# Patient Record
Sex: Female | Born: 1990 | Race: White | Hispanic: No | Smoking: Current every day smoker
Health system: Southern US, Community
[De-identification: ages and names within clinical notes are randomized; demographics above are authoritative.]

## PROBLEM LIST (undated history)

## (undated) ENCOUNTER — Inpatient Hospital Stay (HOSPITAL_COMMUNITY): Payer: Self-pay

## (undated) DIAGNOSIS — N200 Calculus of kidney: Secondary | ICD-10-CM

## (undated) DIAGNOSIS — B1920 Unspecified viral hepatitis C without hepatic coma: Secondary | ICD-10-CM

## (undated) DIAGNOSIS — F1110 Opioid abuse, uncomplicated: Secondary | ICD-10-CM

## (undated) HISTORY — DX: Unspecified viral hepatitis C without hepatic coma: B19.20

---
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
## 2012-07-23 ENCOUNTER — Emergency Department (HOSPITAL_BASED_OUTPATIENT_CLINIC_OR_DEPARTMENT_OTHER)
Admission: EM | Admit: 2012-07-23 | Discharge: 2012-07-23 | Disposition: A | Discharge status: Home / Self Care | Payer: Self-pay | Attending: Emergency Medicine | Admitting: Emergency Medicine

## 2012-07-23 ENCOUNTER — Encounter (HOSPITAL_BASED_OUTPATIENT_CLINIC_OR_DEPARTMENT_OTHER): Payer: Self-pay

## 2012-07-23 DIAGNOSIS — N938 Other specified abnormal uterine and vaginal bleeding: Secondary | ICD-10-CM | POA: Insufficient documentation

## 2012-07-23 DIAGNOSIS — R300 Dysuria: Secondary | ICD-10-CM | POA: Insufficient documentation

## 2012-07-23 DIAGNOSIS — N390 Urinary tract infection, site not specified: Secondary | ICD-10-CM | POA: Insufficient documentation

## 2012-07-23 DIAGNOSIS — F17200 Nicotine dependence, unspecified, uncomplicated: Secondary | ICD-10-CM | POA: Insufficient documentation

## 2012-07-23 DIAGNOSIS — R109 Unspecified abdominal pain: Secondary | ICD-10-CM | POA: Insufficient documentation

## 2012-07-23 DIAGNOSIS — Z3202 Encounter for pregnancy test, result negative: Secondary | ICD-10-CM | POA: Insufficient documentation

## 2012-07-23 DIAGNOSIS — N949 Unspecified condition associated with female genital organs and menstrual cycle: Secondary | ICD-10-CM | POA: Insufficient documentation

## 2012-07-23 LAB — URINALYSIS, ROUTINE W REFLEX MICROSCOPIC
Glucose, UA: NEGATIVE mg/dL
Ketones, ur: NEGATIVE mg/dL
Protein, ur: NEGATIVE mg/dL
pH: 6.5 (ref 5.0–8.0)

## 2012-07-23 LAB — URINE MICROSCOPIC-ADD ON

## 2012-07-23 MED ORDER — SULFAMETHOXAZOLE-TRIMETHOPRIM 800-160 MG PO TABS: 1.0000 | ORAL_TABLET | Freq: Two times a day (BID) | ORAL | Status: DC

## 2012-07-23 NOTE — ED Notes (Signed)
Abd soft. Tender over bladder and LLQ. BS X 4

## 2012-07-23 NOTE — ED Provider Notes (Signed)
History     CSN: [id number]  Arrival date & time 07/23/12  1256   First MD Initiated Contact with Patient 07/23/12 1316      Chief Complaint  Patient presents with  . Vaginal Bleeding  . Abdominal Pain    (Consider location/radiation/quality/duration/timing/severity/associated sxs/prior treatment) HPI Comments: Patient presents with lower abdominal discomfort and vaginal bleeding that started yesterday. She states she had her normal period about a week and half ago and then yesterday started having some vaginal bleeding again. She states she's passing clots at times. She has a crampy discomfort to her suprapubic and lower abdomen. It's otherwise nonradiating. She denies any nausea or vomiting. She denies he fevers or chills. She denies any other vaginal discharge. She does have a little bit of burning on urination has had a history of UTIs in the past.   History reviewed. No pertinent past medical history.  Past Surgical History  Procedure Laterality Date  . Cesarean section  02/02/2012    History reviewed. No pertinent family history.  History  Substance Use Topics  . Smoking status: Current Every Day Smoker -- 0.50 packs/day for 5 years    Types: Cigarettes  . Smokeless tobacco: Never Used  . Alcohol Use: No    OB History   Grav Para Term Preterm Abortions TAB SAB Ect Mult Living                  Review of Systems  Constitutional: Negative for fever, chills, diaphoresis and fatigue.  HENT: Negative for congestion, rhinorrhea and sneezing.   Eyes: Negative.   Respiratory: Negative for cough, chest tightness and shortness of breath.   Cardiovascular: Negative for chest pain and leg swelling.  Gastrointestinal: Negative for nausea, vomiting, abdominal pain, diarrhea and blood in stool.  Genitourinary: Positive for dysuria, vaginal bleeding and pelvic pain. Negative for frequency, hematuria, flank pain and difficulty urinating.  Musculoskeletal: Negative for back pain  and arthralgias.  Skin: Negative for rash.  Neurological: Negative for dizziness, speech difficulty, weakness, numbness and headaches.    Allergies  Review of patient's allergies indicates no known allergies.  Home Medications   Current Outpatient Rx  Name  Route  Sig  Dispense  Refill  . sulfamethoxazole-trimethoprim (SEPTRA DS) 800-160 MG per tablet   Oral   Take 1 tablet by mouth every 12 (twelve) hours.   14 tablet   0     BP 117/75  Pulse 94  Temp(Src) 98.1 F (36.7 C) (Oral)  Resp 16  Ht 5\' 5"  (1.651 m)  Wt 138 lb (62.596 kg)  BMI 22.96 kg/m2  SpO2 99%  LMP 07/04/2012  Physical Exam  Constitutional: She is oriented to person, place, and time. She appears well-developed and well-nourished.  HENT:  Head: Normocephalic and atraumatic.  Eyes: Pupils are equal, round, and reactive to light.  Neck: Normal range of motion. Neck supple.  Cardiovascular: Normal rate, regular rhythm and normal heart sounds.   Pulmonary/Chest: Effort normal and breath sounds normal. No respiratory distress. She has no wheezes. She has no rales. She exhibits no tenderness.  Abdominal: Soft. Bowel sounds are normal. There is tenderness (mild tenderness in the suprapubic area). There is no rebound and no guarding.  Genitourinary: Uterus normal. Cervix exhibits no motion tenderness and no discharge. Right adnexum displays no tenderness. Left adnexum displays no tenderness. There is bleeding (small amount of dark bleeding) around the vagina. No vaginal discharge found.  Musculoskeletal: Normal range of motion. She exhibits no edema.  Lymphadenopathy:    She has no cervical adenopathy.  Neurological: She is alert and oriented to person, place, and time.  Skin: Skin is warm and dry. No rash noted.  Psychiatric: She has a normal mood and affect.    ED Course  Procedures (including critical care time)  Results for orders placed during the hospital encounter of 07/23/12  PREGNANCY, URINE       Result Value Range   Preg Test, Ur NEGATIVE  NEGATIVE   No results found.    1. UTI (lower urinary tract infection)   2. Dysfunctional uterine bleeding       MDM  Pt started on abx, no current heavy bleeding.  Will refer to outpt clinic.        Rolan Bucco, MD 07/23/12 [phone]

## 2012-07-23 NOTE — ED Notes (Signed)
Pt states that she is experiencing some irregular vaginal bleeding associated with bilateral lower abdominal pain which she states is over her ovaries.

## 2012-07-23 NOTE — ED Notes (Signed)
Pelvic cart is at the bedside set up and ready for the doctor to use. 

## 2012-07-26 LAB — URINE CULTURE

## 2012-07-27 ENCOUNTER — Telehealth (HOSPITAL_COMMUNITY): Payer: Self-pay | Admitting: Emergency Medicine

## 2012-07-27 NOTE — ED Notes (Signed)
+  Urine. Patient treated with Septra. Sensitive to same. Per protocol MD. °

## 2012-07-27 NOTE — ED Notes (Signed)
Patient has +Urine culture. °

## 2013-02-03 ENCOUNTER — Emergency Department (HOSPITAL_BASED_OUTPATIENT_CLINIC_OR_DEPARTMENT_OTHER)
Admission: EM | Admit: 2013-02-03 | Discharge: 2013-02-03 | Disposition: A | Discharge status: Home / Self Care | Payer: Self-pay | Attending: Emergency Medicine | Admitting: Emergency Medicine

## 2013-02-03 ENCOUNTER — Encounter (HOSPITAL_BASED_OUTPATIENT_CLINIC_OR_DEPARTMENT_OTHER): Payer: Self-pay | Admitting: Emergency Medicine

## 2013-02-03 DIAGNOSIS — F1910 Other psychoactive substance abuse, uncomplicated: Secondary | ICD-10-CM | POA: Insufficient documentation

## 2013-02-03 DIAGNOSIS — F17200 Nicotine dependence, unspecified, uncomplicated: Secondary | ICD-10-CM | POA: Insufficient documentation

## 2013-02-03 HISTORY — DX: Opioid abuse, uncomplicated: F11.10

## 2013-02-03 MED ORDER — CLONIDINE HCL 0.2 MG PO TABS: 0.2000 mg | ORAL_TABLET | Freq: Two times a day (BID) | ORAL | Status: DC

## 2013-02-03 NOTE — ED Notes (Signed)
Patient asks to use the restroom. She was prompted to get a urine sample. States she is going to the bathroom but she cannot provide urine at this time.

## 2013-02-03 NOTE — ED Provider Notes (Signed)
CSN: [id number]     Arrival date & time 02/03/13  1425 History   First MD Initiated Contact with Patient 02/03/13 1457     Chief Complaint  Patient presents with  . Medical Clearance   (Consider location/radiation/quality/duration/timing/severity/associated sxs/prior Treatment) Patient is a 22 y.o. female presenting with drug/alcohol assessment. The history is provided by the patient. No language interpreter was used.  Drug / Alcohol Assessment Similar prior episodes: yes   Severity:  Moderate Timing:  Constant Progression:  Worsening Pt was just discharged from [place].   She plans to go into [facility].   Pt reports she is here for medication to help with withdrawal.  (Pt request suboxone) Pt is planning to go back to [facility] but has decided she wants to go home today  Past Medical History  Diagnosis Date  . Heroin abuse    Past Surgical History  Procedure Laterality Date  . Cesarean section  02/02/2012   No family history on file. History  Substance Use Topics  . Smoking status: Current Every Day Smoker -- 0.50 packs/day for 5 years    Types: Cigarettes  . Smokeless tobacco: Never Used  . Alcohol Use: No   OB History   Grav Para Term Preterm Abortions TAB SAB Ect Mult Living                 Review of Systems  All other systems reviewed and are negative.    Allergies  Review of patient's allergies indicates no known allergies.  Home Medications   Current Outpatient Rx  Name  Route  Sig  Dispense  Refill  . sulfamethoxazole-trimethoprim (SEPTRA DS) 800-160 MG per tablet   Oral   Take 1 tablet by mouth every 12 (twelve) hours.   14 tablet   0    BP 113/86  Pulse 115  Temp(Src) 98 F (36.7 C) (Oral)  Resp 18  Ht 5\' 6"  (1.676 m)  Wt 127 lb (57.607 kg)  BMI 20.51 kg/m2  SpO2 100% Physical Exam  Nursing note and vitals reviewed. Constitutional: She appears well-developed and well-nourished.  HENT:  Head: Normocephalic and atraumatic.  Eyes: Pupils are  equal, round, and reactive to light.  Neck: Normal range of motion. Neck supple.  Cardiovascular: Normal rate and regular rhythm.   Pulmonary/Chest: Effort normal.  Abdominal: Soft. Bowel sounds are normal.  Musculoskeletal: Normal range of motion.  Neurological: She is alert.  Skin: Skin is warm.  Psychiatric: She has a normal mood and affect.    ED Course  Procedures (including critical care time) Labs Review Labs Reviewed - No data to display Imaging Review No results found.  EKG Interpretation   None       MDM   1. Substance abuse    Clonidine,   Return if any problems.    Elson Areas, PA-C 02/03/13 1620

## 2013-02-03 NOTE — ED Provider Notes (Signed)
Medical screening examination/treatment/procedure(s) were performed by non-physician practitioner and as supervising physician I was immediately available for consultation/collaboration.   Rolan Bucco, MD 02/03/13 1750

## 2013-02-03 NOTE — ED Notes (Signed)
Patient states she wants to leave and go somewhere else for detox.  She states she was brought here today by [facility] for detox from heroin.  States she wants to call her mother, but is not getting any answer.

## 2013-02-03 NOTE — ED Notes (Signed)
Patient states she wants detox from Heroin.  States she has been using for the last seven months and that her last dose was two days ago.  States she has been injecting up to two to three grams of heroin a day.

## 2014-06-14 ENCOUNTER — Emergency Department (HOSPITAL_COMMUNITY): Payer: Self-pay

## 2014-06-14 ENCOUNTER — Emergency Department (HOSPITAL_COMMUNITY)
Admission: EM | Admit: 2014-06-14 | Discharge: 2014-06-14 | Disposition: A | Discharge status: Home / Self Care | Payer: Self-pay | Attending: Emergency Medicine | Admitting: Emergency Medicine

## 2014-06-14 ENCOUNTER — Encounter (HOSPITAL_COMMUNITY): Payer: Self-pay | Admitting: *Deleted

## 2014-06-14 DIAGNOSIS — Y998 Other external cause status: Secondary | ICD-10-CM | POA: Insufficient documentation

## 2014-06-14 DIAGNOSIS — Y9389 Activity, other specified: Secondary | ICD-10-CM | POA: Insufficient documentation

## 2014-06-14 DIAGNOSIS — F1110 Opioid abuse, uncomplicated: Secondary | ICD-10-CM | POA: Insufficient documentation

## 2014-06-14 DIAGNOSIS — S20211A Contusion of right front wall of thorax, initial encounter: Secondary | ICD-10-CM | POA: Insufficient documentation

## 2014-06-14 DIAGNOSIS — Z9889 Other specified postprocedural states: Secondary | ICD-10-CM | POA: Insufficient documentation

## 2014-06-14 DIAGNOSIS — F419 Anxiety disorder, unspecified: Secondary | ICD-10-CM | POA: Insufficient documentation

## 2014-06-14 DIAGNOSIS — Z720 Tobacco use: Secondary | ICD-10-CM | POA: Insufficient documentation

## 2014-06-14 DIAGNOSIS — Y9289 Other specified places as the place of occurrence of the external cause: Secondary | ICD-10-CM | POA: Insufficient documentation

## 2014-06-14 IMAGING — CR DG RIBS W/ CHEST 3+V*R*
4 series · 4 of 4 positions shown · non-contrast
Comparison: None.

CLINICAL DATA: Tackling injury.  Complains of right rib cage pain.

EXAM:
RIGHT RIBS AND CHEST - 3+ VIEW

[w chest pa]
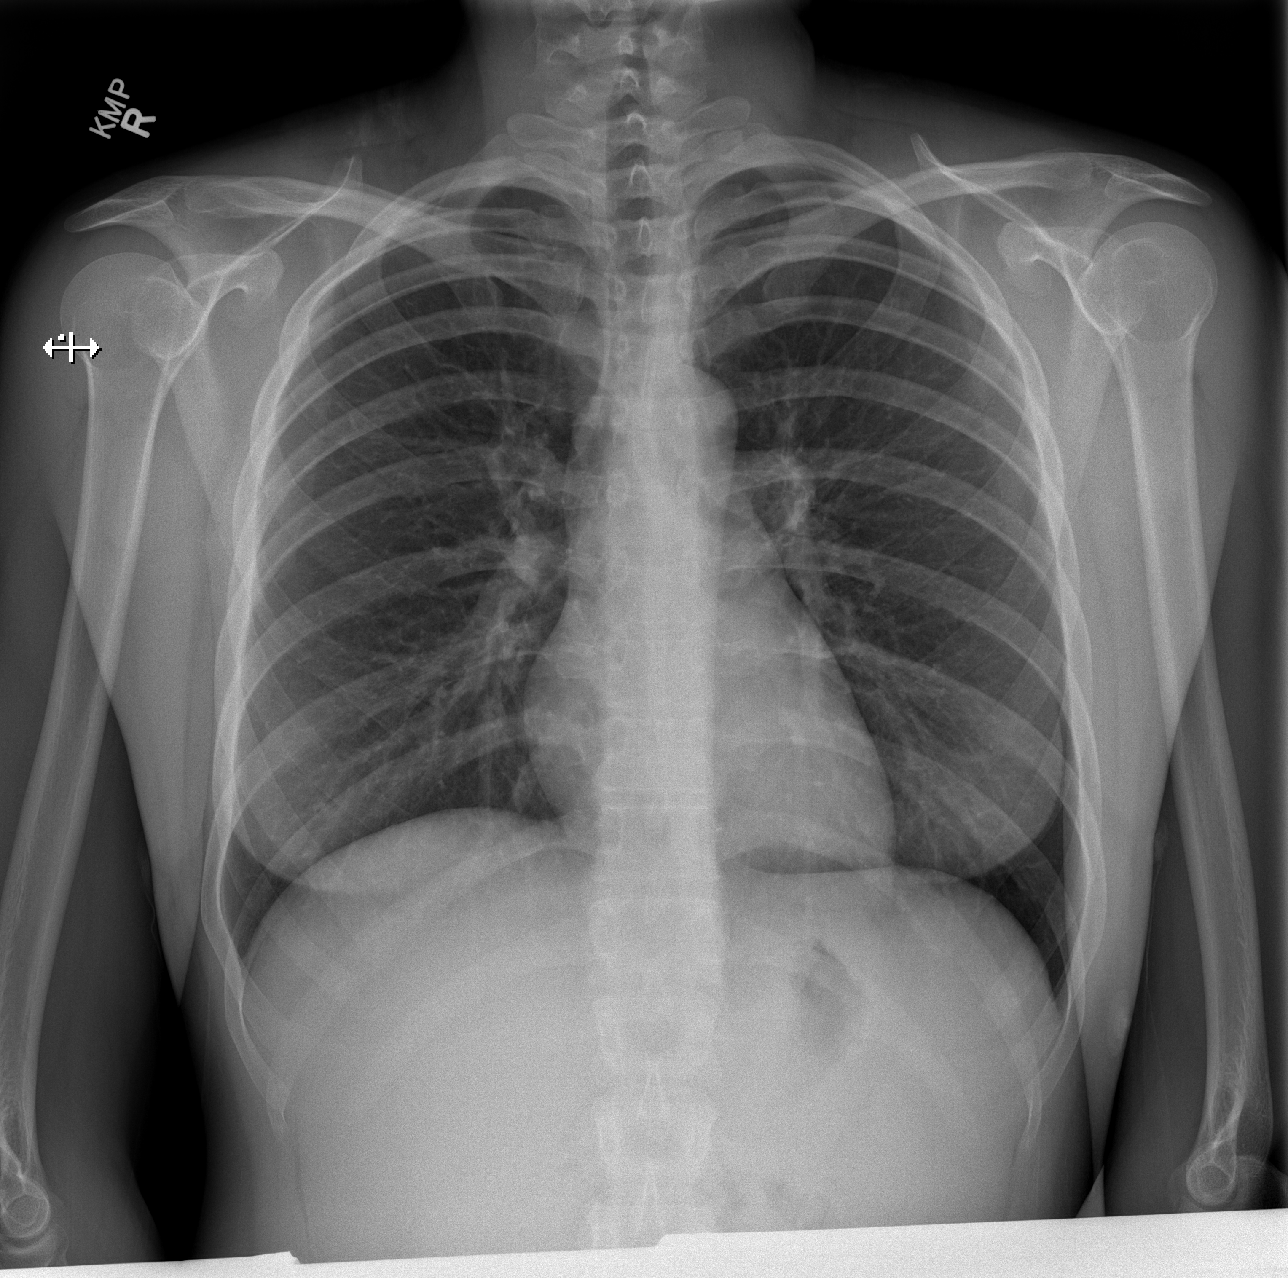

[w ribs ap upper right]
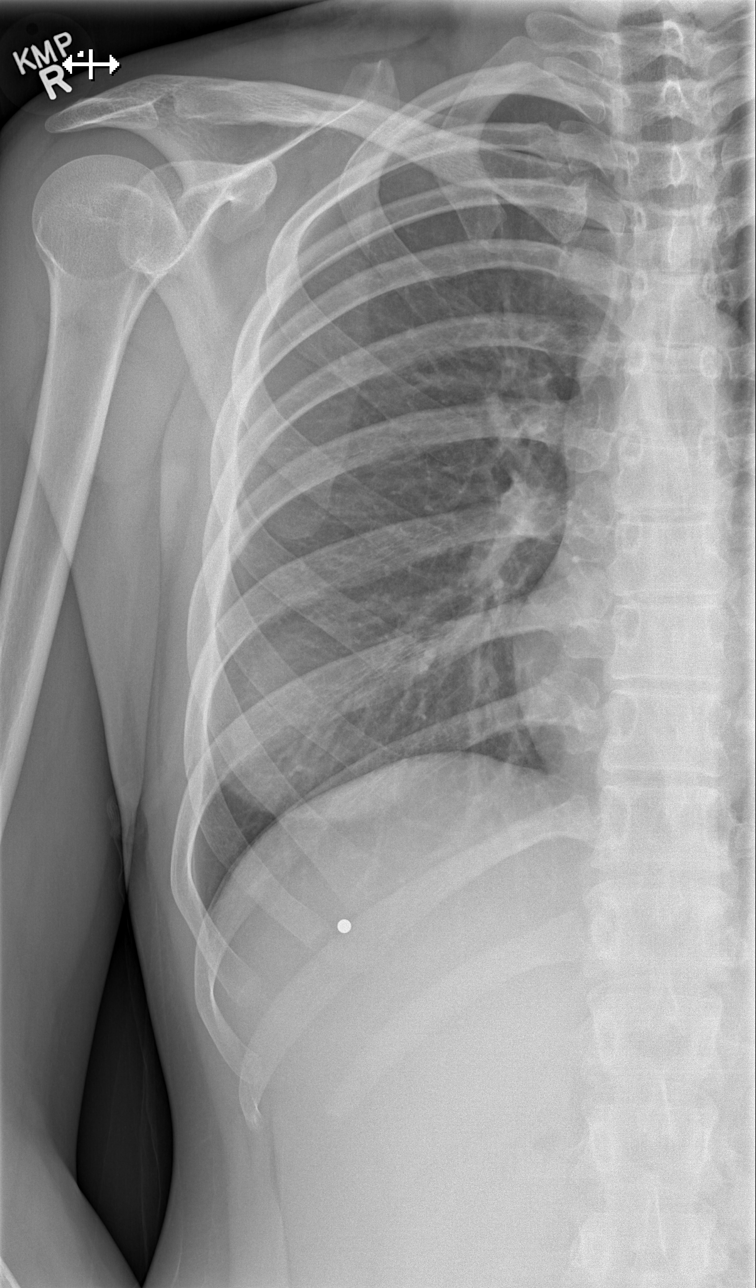

[w ribs ap lower right]
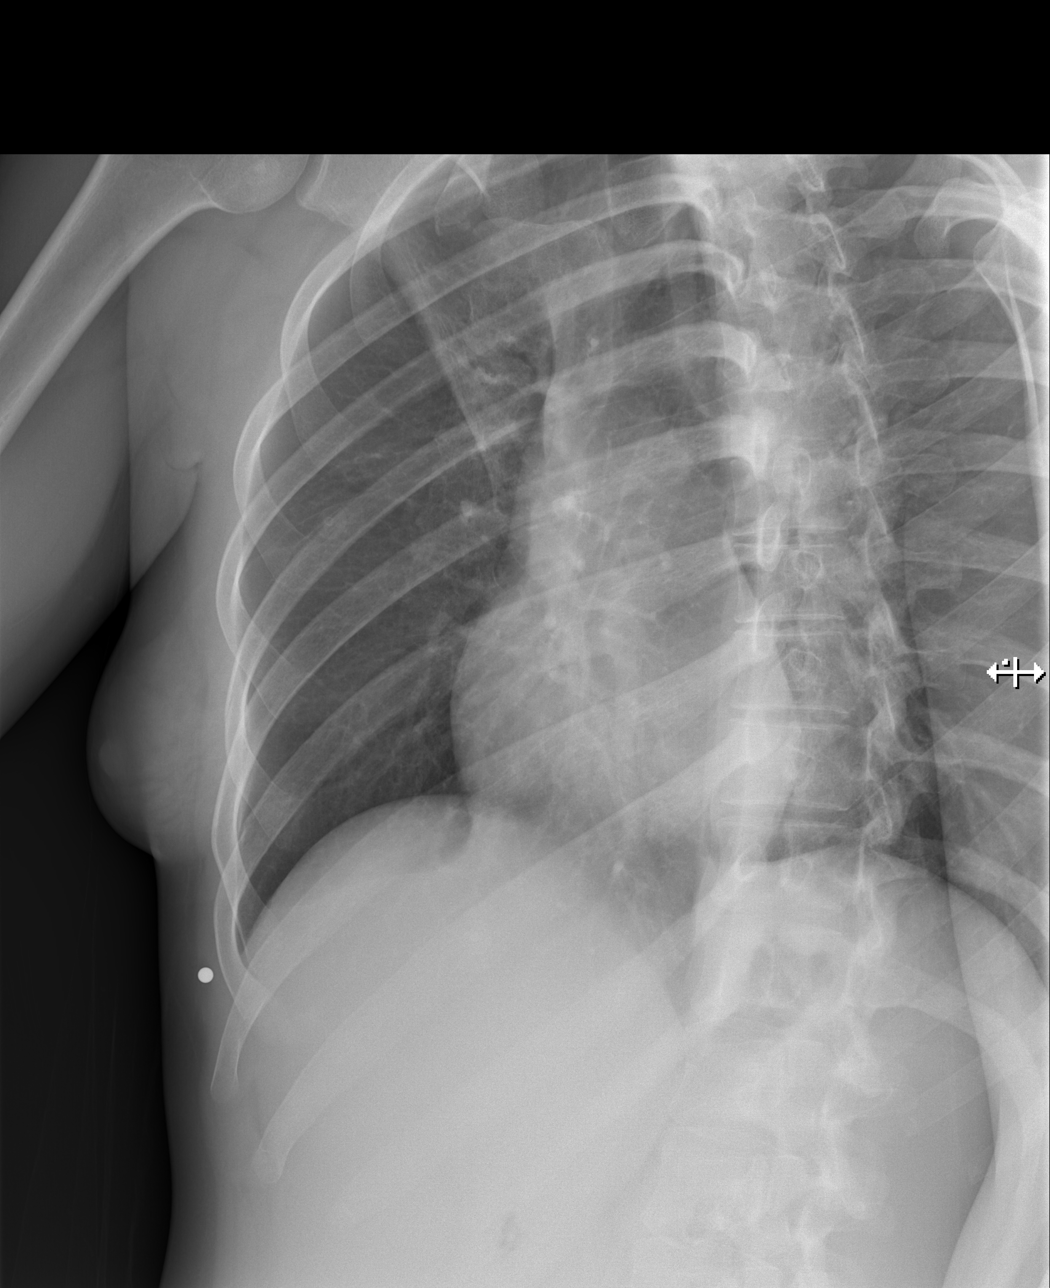

[w ribs obl right]
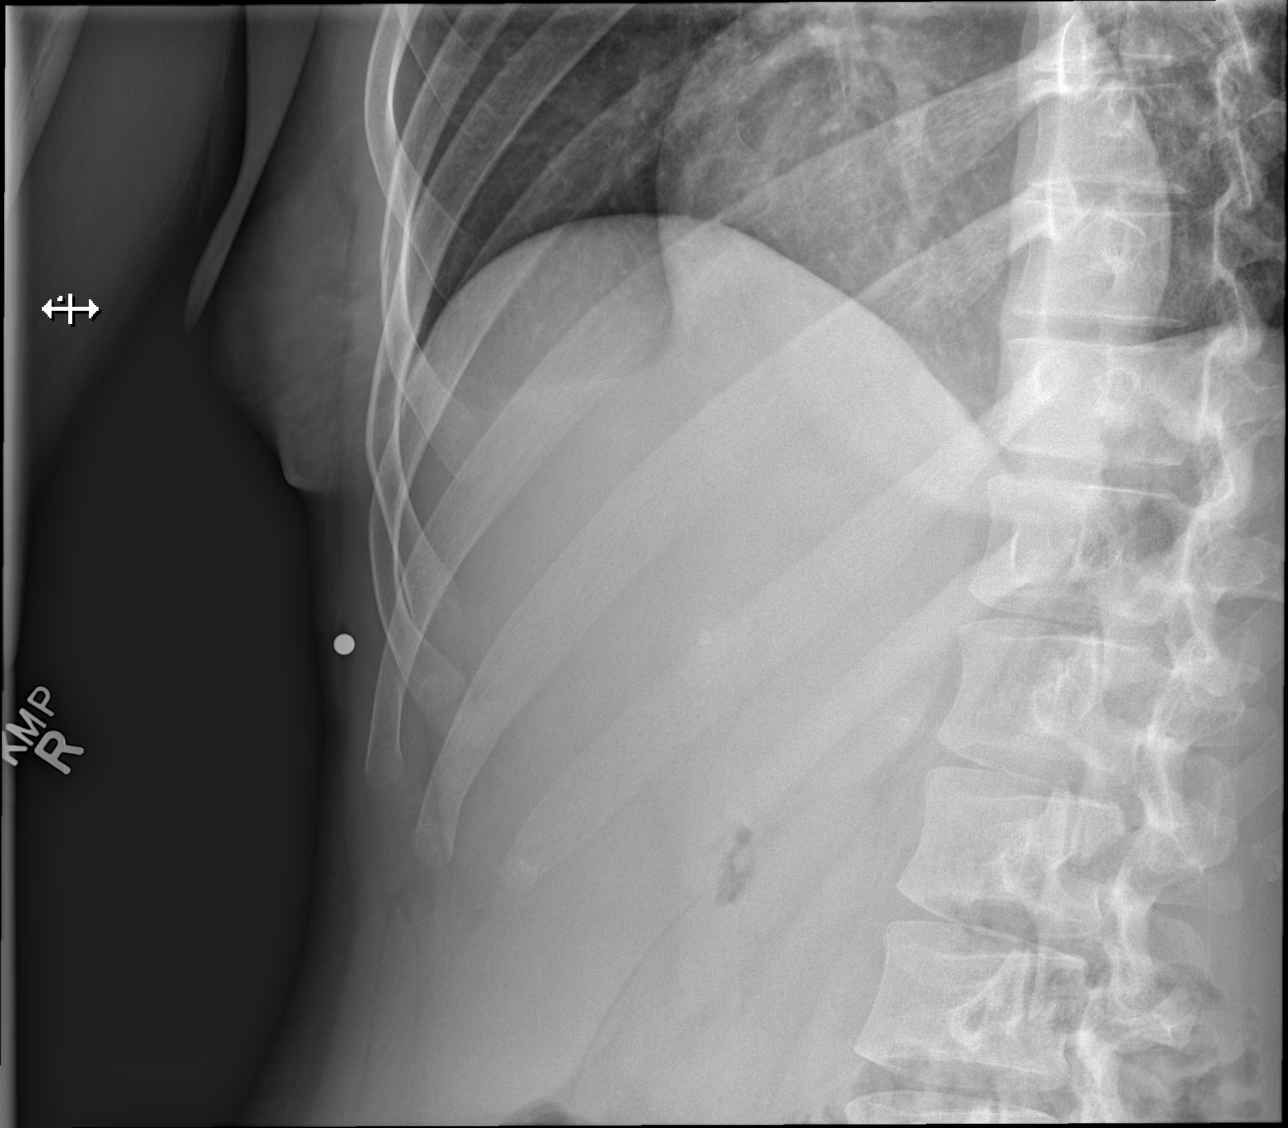

[4 of 4 positions shown; findings below may reference images not displayed]

FINDINGS: Negative for a pneumothorax. Normal appearance of the heart and
mediastinum. Both lungs are clear. Clavicles are intact. No evidence
for a displaced right rib fracture. There is mild irregularity of
the lateral right eighth rib but this does not clearly represent an
acute fracture.
IMPRESSION: No acute chest abnormality.

No definite right rib fracture. Mild irregularity of the right
eighth rib is indeterminate.

## 2014-06-14 MED ORDER — NAPROXEN 500 MG PO TABS: 500.0000 mg | ORAL_TABLET | Freq: Two times a day (BID) | ORAL | Status: DC

## 2014-06-14 MED ORDER — ONDANSETRON 8 MG PO TBDP
8.0000 mg | ORAL_TABLET | Freq: Once | ORAL | Status: AC
Start: 2014-06-14 — End: 2014-06-14
  Administered 2014-06-14: 8 mg via ORAL
  Filled 2014-06-14: qty 1

## 2014-06-14 MED ORDER — IBUPROFEN 800 MG PO TABS
800.0000 mg | ORAL_TABLET | Freq: Once | ORAL | Status: AC
  Administered 2014-06-14: 800 mg via ORAL
  Filled 2014-06-14: qty 1

## 2014-06-14 MED ORDER — CLONIDINE HCL 0.1 MG PO TABS
0.1000 mg | ORAL_TABLET | Freq: Once | ORAL | Status: AC
  Administered 2014-06-14: 0.1 mg via ORAL
  Filled 2014-06-14: qty 1

## 2014-06-14 NOTE — ED Provider Notes (Signed)
CSN: [id number]     Arrival date & time 06/14/14  1929 History  This chart was scribed for non-physician practitioner, Antony Madura working with Gwyneth Sprout, MD, by Roxy Cedar ED Scribe. This patient was seen in room WTR8/WTR8 and the patient's care was started at 8:09 PM    Chief Complaint  Patient presents with  . Ribcage pain    The history is provided by the patient. No language interpreter was used.   HPI Comments: Christy Pearson is a 24 y.o. female with a PMHx of heroin abuse and cesarean section, who presents to the Emergency Department complaining of moderate right sided ribcage pain. She describes it as "deep pressure and sharp pain". She states that the pain improves when she "slouches over". She reports mild SOB when sitting up. She denies associated abdominal pain, hemoptysis, or LOC. She states that she was caught shoplifting, and states that she was tackled by the police causing her injuries. She also reports onset of nausea secondary to IV heroin withdrawal with last use this morning. She also reports restless legs bilaterally.   Past Medical History  Diagnosis Date  . Heroin abuse    Past Surgical History  Procedure Laterality Date  . Cesarean section  02/02/2012   No family history on file. History  Substance Use Topics  . Smoking status: Current Every Day Smoker -- 0.50 packs/day for 5 years    Types: Cigarettes  . Smokeless tobacco: Never Used  . Alcohol Use: No   OB History    No data available     Review of Systems  Gastrointestinal: Positive for nausea.  Musculoskeletal:       Right sided rib pain.   Psychiatric/Behavioral:       Anxiety  All other systems reviewed and are negative.  Allergies  Review of patient's allergies indicates no known allergies.  Home Medications   Prior to Admission medications   Medication Sig Start Date End Date Taking? Authorizing Provider  cloNIDine (CATAPRES) 0.2 MG tablet Take 1 tablet (0.2 mg total) by  mouth 2 (two) times daily. Patient not taking: Reported on 06/14/2014 02/03/13   Elson Areas, PA-C  naproxen (NAPROSYN) 500 MG tablet Take 1 tablet (500 mg total) by mouth 2 (two) times daily. 06/14/14   Antony Madura, PA-C  sulfamethoxazole-trimethoprim (SEPTRA DS) 800-160 MG per tablet Take 1 tablet by mouth every 12 (twelve) hours. Patient not taking: Reported on 06/14/2014 07/23/12   Rolan Bucco, MD   Triage Vitals: BP 114/66 mmHg  Pulse 74  Temp(Src) 97.8 F (36.6 C) (Oral)  Resp 20  SpO2 99%  Physical Exam  Constitutional: She is oriented to person, place, and time. She appears well-developed and well-nourished. No distress.  Nontoxic/nonseptic appearing  HENT:  Head: Normocephalic and atraumatic.  Eyes: Conjunctivae and EOM are normal. No scleral icterus.  Neck: Normal range of motion.  Cardiovascular: Normal rate, regular rhythm and intact distal pulses.   Pulmonary/Chest: Effort normal. No respiratory distress. She has no wheezes. She has no rales. She exhibits tenderness and bony tenderness. She exhibits no laceration, no crepitus, no deformity, no swelling and no retraction.    TTP to the R anterior chest wall just inferior to the R breast. No crepitus, contusion, erythema, or deformity. Chest expansion symmetric. Lungs CTAB.  Abdominal: Soft. She exhibits no distension. There is no tenderness.  Soft, nontender  Musculoskeletal: Normal range of motion.  Neurological: She is alert and oriented to person, place, and time. She exhibits normal  muscle tone. Coordination normal.  GCS 15. Speech is goal oriented. Patient moves extremities without ataxia.  Skin: Skin is warm and dry. No rash noted. She is not diaphoretic. No erythema. No pallor.  Psychiatric: She has a normal mood and affect. Her behavior is normal.  Nursing note and vitals reviewed.   ED Course  Procedures (including critical care time)  DIAGNOSTIC STUDIES: Oxygen Saturation is 99% on RA, normal by my  interpretation.    COORDINATION OF CARE: 8:11 PM- Discussed plans to order diagnostic imaging of ribs unilateral with right chest. Pt advised of plan for treatment and pt agrees.  Labs Review Labs Reviewed - No data to display  Imaging Review Dg Ribs Unilateral W/chest Right  06/14/2014   CLINICAL DATA:  Tackling injury.  Complains of right rib cage pain.  EXAM: RIGHT RIBS AND CHEST - 3+ VIEW  COMPARISON:  None.  FINDINGS: Negative for a pneumothorax. Normal appearance of the heart and mediastinum. Both lungs are clear. Clavicles are intact. No evidence for a displaced right rib fracture. There is mild irregularity of the lateral right eighth rib but this does not clearly represent an acute fracture.  IMPRESSION: No acute chest abnormality.  No definite right rib fracture. Mild irregularity of the right eighth rib is indeterminate.   Electronically Signed   By: Richarda OverlieAdam  Henn M.D.   On: 06/14/2014 20:45     EKG Interpretation None     MDM   Final diagnoses:  Chest wall contusion, right, initial encounter  Heroin abuse    24 year old female plans to the emergency department for further evaluation of right anterior chest wall pain after being tackled by [employer] when patient was found shoplifting. No head trauma or loss of consciousness. There is tenderness to palpation to the right anterior chest wall. No crepitus or deformity. Lungs clear. Imaging negative for fracture. Symptoms consistent with contusion. Will manage with incentive spirometer, to be used once an hour while awake, as well as ibuprofen. Return precautions given. Patient agreeable to plan with no unaddressed concerns. Patient discharged in [employer] custody in good condition. She was given Catapres and Zofran prior to discharge for withdrawal symptoms 2/2 IV heroin abuse.  I personally performed the services described in this documentation, which was scribed in my presence. The recorded information has been reviewed and is  accurate.   Filed Vitals:   06/14/14 1934  BP: 114/66  Pulse: 74  Temp: 97.8 F (36.6 C)  TempSrc: Oral  Resp: 20  SpO2: 99%     Antony MaduraKelly Emmanuela Ghazi, PA-C 06/14/14 2111  Gwyneth SproutWhitney Plunkett, MD 06/14/14 [phone]

## 2014-06-14 NOTE — Discharge Instructions (Signed)

## 2014-06-14 NOTE — ED Notes (Signed)
Per EMS, pt complains of right sided rib. Pt caught shoplifting, now complains of right sided rib pain after being tackled. Pain hurts more while breathing. No deformity or bruising noted. Denies SOB.

## 2015-10-02 ENCOUNTER — Emergency Department (HOSPITAL_BASED_OUTPATIENT_CLINIC_OR_DEPARTMENT_OTHER): Payer: Medicaid Other

## 2015-10-02 ENCOUNTER — Inpatient Hospital Stay (HOSPITAL_COMMUNITY): Payer: Medicaid Other

## 2015-10-02 ENCOUNTER — Encounter (HOSPITAL_BASED_OUTPATIENT_CLINIC_OR_DEPARTMENT_OTHER): Payer: Self-pay

## 2015-10-02 ENCOUNTER — Inpatient Hospital Stay (EMERGENCY_DEPARTMENT_HOSPITAL): Payer: Medicaid Other

## 2015-10-02 ENCOUNTER — Inpatient Hospital Stay (HOSPITAL_BASED_OUTPATIENT_CLINIC_OR_DEPARTMENT_OTHER)
Admission: AD | Admit: 2015-10-02 | Discharge: 2015-10-02 | Disposition: A | Discharge status: Home / Self Care | Payer: Medicaid Other | Attending: Family Medicine | Admitting: Family Medicine

## 2015-10-02 DIAGNOSIS — M79604 Pain in right leg: Secondary | ICD-10-CM | POA: Diagnosis present

## 2015-10-02 DIAGNOSIS — D509 Iron deficiency anemia, unspecified: Secondary | ICD-10-CM | POA: Insufficient documentation

## 2015-10-02 DIAGNOSIS — O26899 Other specified pregnancy related conditions, unspecified trimester: Secondary | ICD-10-CM

## 2015-10-02 DIAGNOSIS — R52 Pain, unspecified: Secondary | ICD-10-CM

## 2015-10-02 DIAGNOSIS — O30049 Twin pregnancy, dichorionic/diamniotic, unspecified trimester: Secondary | ICD-10-CM

## 2015-10-02 DIAGNOSIS — Z98891 History of uterine scar from previous surgery: Secondary | ICD-10-CM

## 2015-10-02 DIAGNOSIS — M549 Dorsalgia, unspecified: Secondary | ICD-10-CM

## 2015-10-02 DIAGNOSIS — O9989 Other specified diseases and conditions complicating pregnancy, childbirth and the puerperium: Secondary | ICD-10-CM

## 2015-10-02 DIAGNOSIS — O99333 Smoking (tobacco) complicating pregnancy, third trimester: Secondary | ICD-10-CM | POA: Insufficient documentation

## 2015-10-02 DIAGNOSIS — O99013 Anemia complicating pregnancy, third trimester: Secondary | ICD-10-CM | POA: Insufficient documentation

## 2015-10-02 DIAGNOSIS — Z3A24 24 weeks gestation of pregnancy: Secondary | ICD-10-CM

## 2015-10-02 DIAGNOSIS — O99323 Drug use complicating pregnancy, third trimester: Secondary | ICD-10-CM | POA: Insufficient documentation

## 2015-10-02 DIAGNOSIS — O0933 Supervision of pregnancy with insufficient antenatal care, third trimester: Secondary | ICD-10-CM

## 2015-10-02 DIAGNOSIS — O26893 Other specified pregnancy related conditions, third trimester: Secondary | ICD-10-CM | POA: Insufficient documentation

## 2015-10-02 DIAGNOSIS — O30043 Twin pregnancy, dichorionic/diamniotic, third trimester: Secondary | ICD-10-CM | POA: Diagnosis not present

## 2015-10-02 DIAGNOSIS — O99019 Anemia complicating pregnancy, unspecified trimester: Secondary | ICD-10-CM

## 2015-10-02 DIAGNOSIS — R109 Unspecified abdominal pain: Secondary | ICD-10-CM

## 2015-10-02 DIAGNOSIS — M79606 Pain in leg, unspecified: Secondary | ICD-10-CM

## 2015-10-02 DIAGNOSIS — Z3A29 29 weeks gestation of pregnancy: Secondary | ICD-10-CM | POA: Diagnosis not present

## 2015-10-02 DIAGNOSIS — F1910 Other psychoactive substance abuse, uncomplicated: Secondary | ICD-10-CM | POA: Diagnosis not present

## 2015-10-02 DIAGNOSIS — F17210 Nicotine dependence, cigarettes, uncomplicated: Secondary | ICD-10-CM | POA: Diagnosis not present

## 2015-10-02 DIAGNOSIS — F1990 Other psychoactive substance use, unspecified, uncomplicated: Secondary | ICD-10-CM | POA: Diagnosis present

## 2015-10-02 HISTORY — DX: Anemia complicating pregnancy, unspecified trimester: O99.019

## 2015-10-02 LAB — COMPREHENSIVE METABOLIC PANEL
ALT: 14 U/L (ref 14–54)
AST: 32 U/L (ref 15–41)
Albumin: 2.6 g/dL — ABNORMAL LOW (ref 3.5–5.0)
Alkaline Phosphatase: 120 U/L (ref 38–126)
Anion gap: 13 (ref 5–15)
BUN: 6 mg/dL (ref 6–20)
CO2: 14 mmol/L — ABNORMAL LOW (ref 22–32)
Calcium: 8.4 mg/dL — ABNORMAL LOW (ref 8.9–10.3)
Chloride: 104 mmol/L (ref 101–111)
Creatinine, Ser: 0.74 mg/dL (ref 0.44–1.00)
GFR calc Af Amer: >60 mL/min (ref >60)
GFR calc non Af Amer: >60 mL/min (ref >60)
Glucose, Bld: 60 mg/dL — ABNORMAL LOW (ref 65–99)
Potassium: 4.2 mmol/L (ref 3.5–5.1)
Sodium: 131 mmol/L — ABNORMAL LOW (ref 135–145)
Total Bilirubin: 1.9 mg/dL — ABNORMAL HIGH (ref 0.3–1.2)
Total Protein: 6.5 g/dL (ref 6.5–8.1)

## 2015-10-02 LAB — RAPID URINE DRUG SCREEN, HOSP PERFORMED
AMPHETAMINES: POSITIVE — AB
BENZODIAZEPINES: NOT DETECTED
Barbiturates: NOT DETECTED
COCAINE: POSITIVE — AB
Opiates: POSITIVE — AB
TETRAHYDROCANNABINOL: NOT DETECTED

## 2015-10-02 LAB — CBC WITH DIFFERENTIAL/PLATELET
BASOS PCT: 0 %
Basophils Absolute: 0 10*3/uL (ref 0.0–0.1)
EOS PCT: 0 %
Eosinophils Absolute: 0 10*3/uL (ref 0.0–0.7)
HEMATOCRIT: 23.9 % — AB (ref 36.0–46.0)
Hemoglobin: 7.4 g/dL — ABNORMAL LOW (ref 12.0–15.0)
LYMPHS PCT: 7 %
Lymphs Abs: 0.4 10*3/uL — ABNORMAL LOW (ref 0.7–4.0)
MCH: 23.1 pg — ABNORMAL LOW (ref 26.0–34.0)
MCHC: 31 g/dL (ref 30.0–36.0)
MCV: 74.5 fL — ABNORMAL LOW (ref 78.0–100.0)
MONO ABS: 0 10*3/uL — AB (ref 0.1–1.0)
Monocytes Relative: 0 %
Neutro Abs: 5.1 10*3/uL (ref 1.7–7.7)
Neutrophils Relative %: 93 %
Platelets: 366 10*3/uL (ref 150–400)
RBC: 3.21 MIL/uL — ABNORMAL LOW (ref 3.87–5.11)
RDW: 15.3 % (ref 11.5–15.5)
WBC Morphology: INCREASED
WBC: 5.5 10*3/uL (ref 4.0–10.5)

## 2015-10-02 LAB — URINALYSIS, ROUTINE W REFLEX MICROSCOPIC
Glucose, UA: NEGATIVE mg/dL
Hgb urine dipstick: NEGATIVE
Ketones, ur: >80 mg/dL — AB
Nitrite: NEGATIVE
Protein, ur: 30 mg/dL — AB
Specific Gravity, Urine: 1.02 (ref 1.005–1.030)
pH: 6 (ref 5.0–8.0)

## 2015-10-02 LAB — FERRITIN: FERRITIN: 8 ng/mL — AB (ref 11–307)

## 2015-10-02 LAB — ABO/RH: ABO/RH(D): A POS

## 2015-10-02 LAB — URINE MICROSCOPIC-ADD ON: RBC / HPF: NONE SEEN RBC/hpf (ref 0–5)

## 2015-10-02 LAB — HEPATITIS B SURFACE ANTIGEN: Hepatitis B Surface Ag: NEGATIVE

## 2015-10-02 IMAGING — US US MFM OB LIMITED
1 series · 14 of 28 positions shown · non-contrast
Comparison: none

[Series 1: us mfm ob limited · 51 acquisitions, 14 frames shown]
[im 2/51]
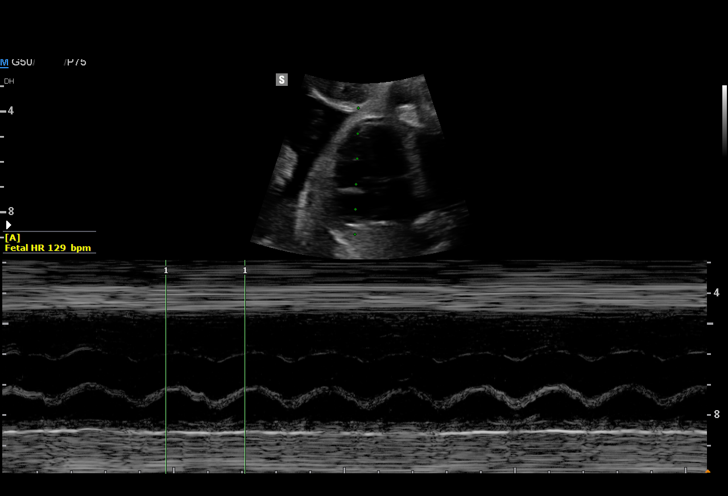
[im 6/51]
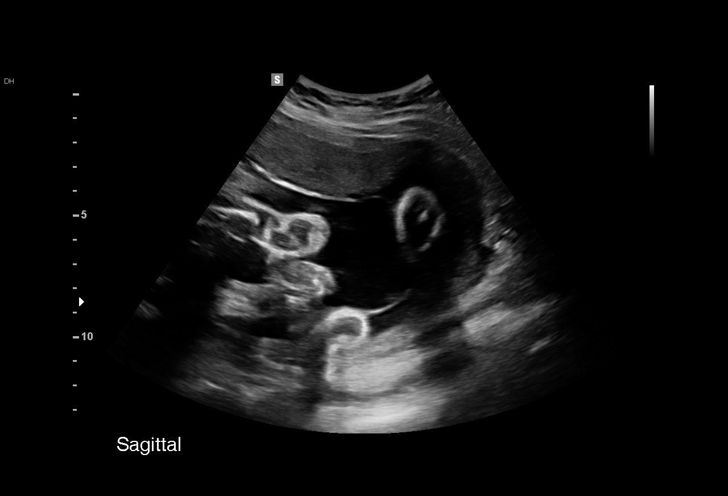
[im 10/51]
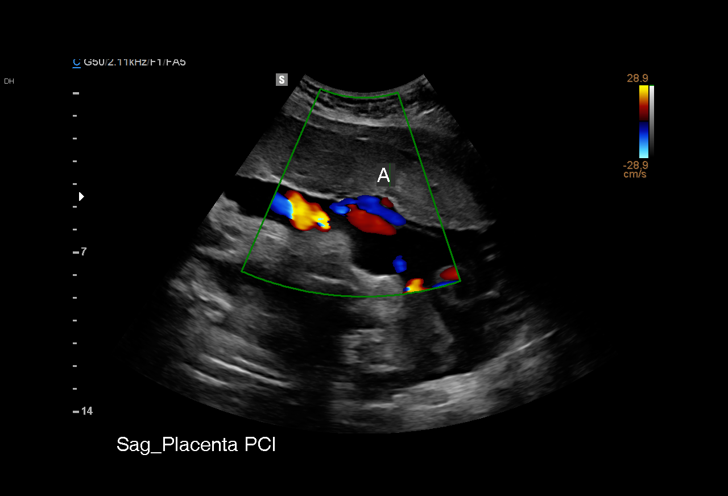
[im 13/51]
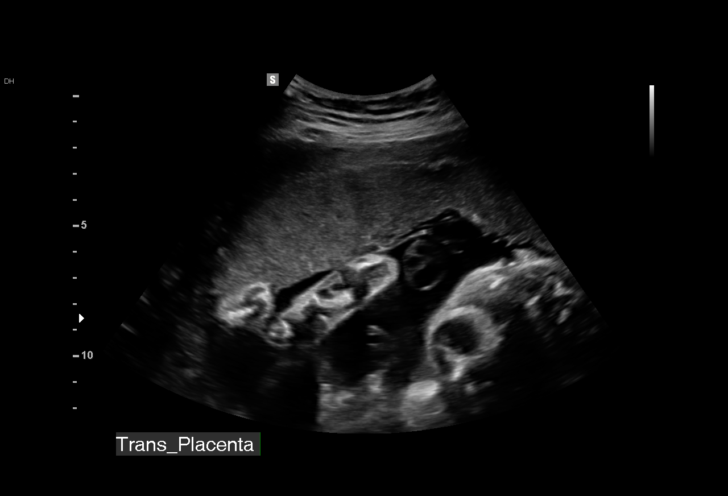
[im 17/51]
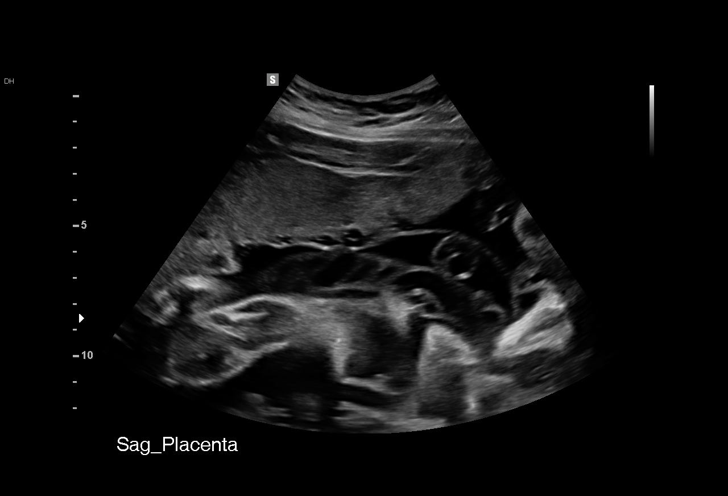
[im 21/51]
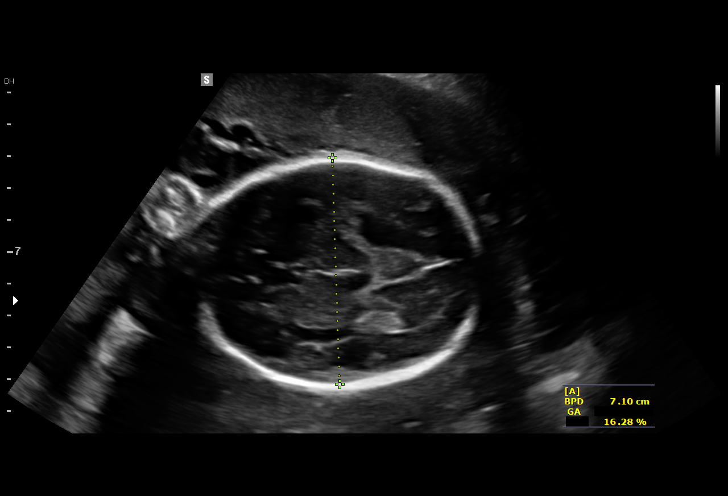
[im 25/51]
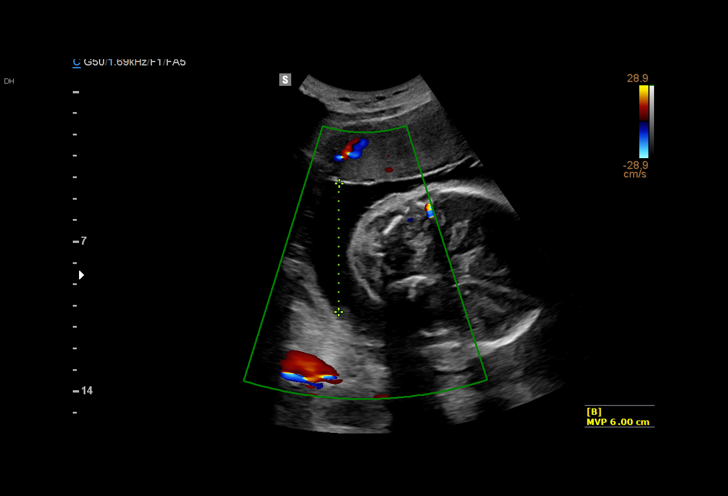
[im 28/51]
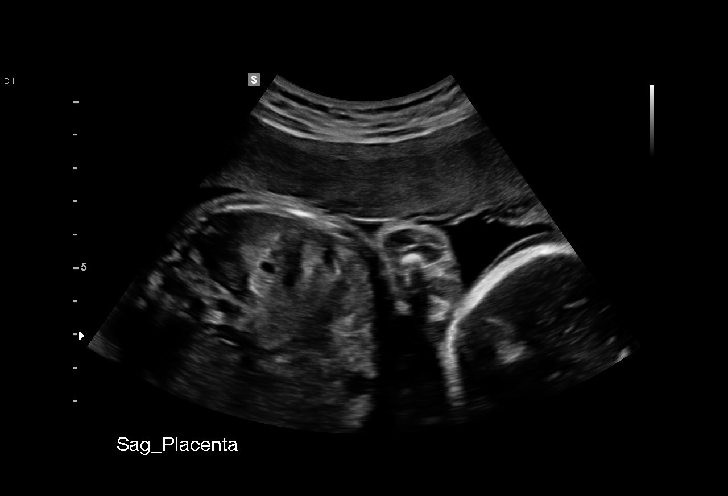
[im 32/51]
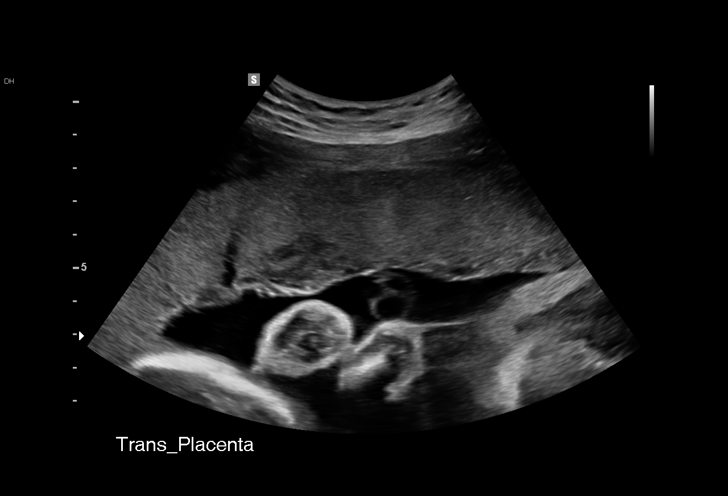
[im 36/51]
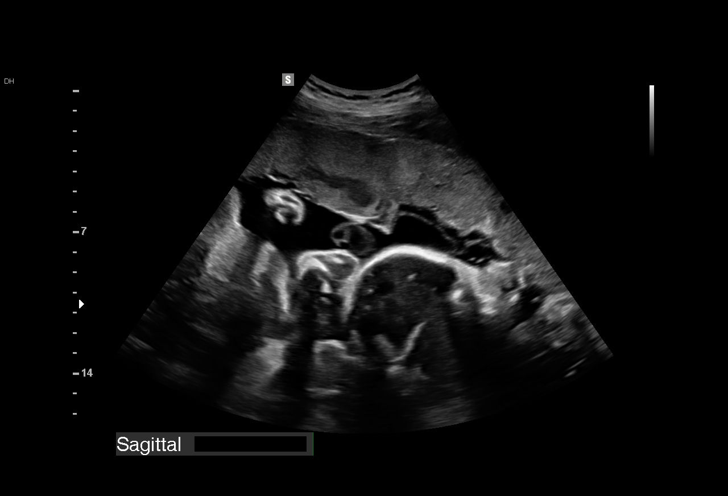
[im 39/51]
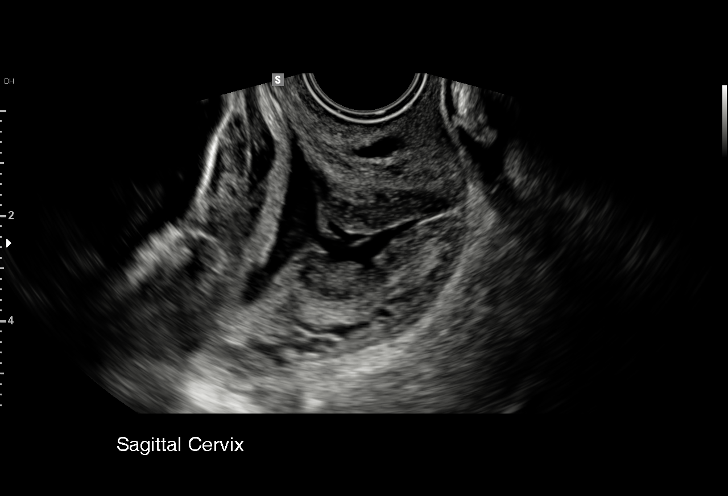
[im 43/51]
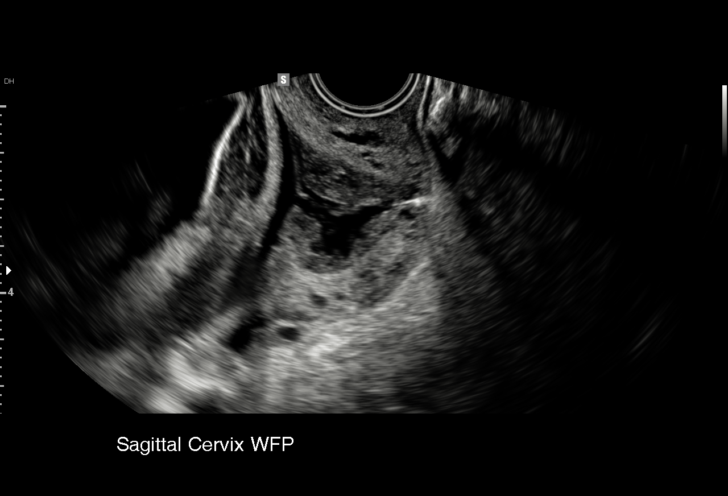
[im 47/51]
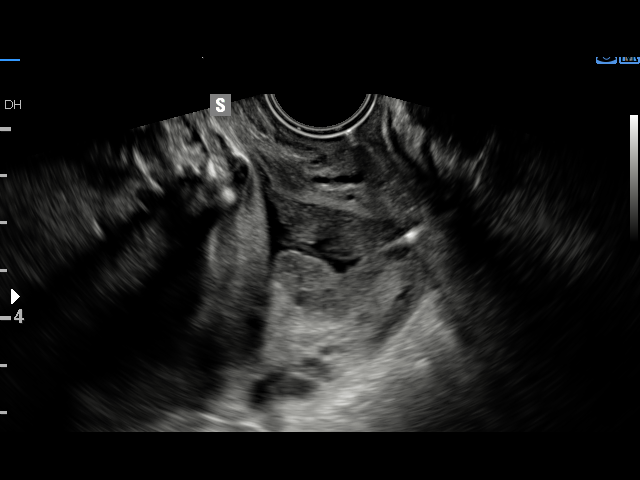
[im 51/51]
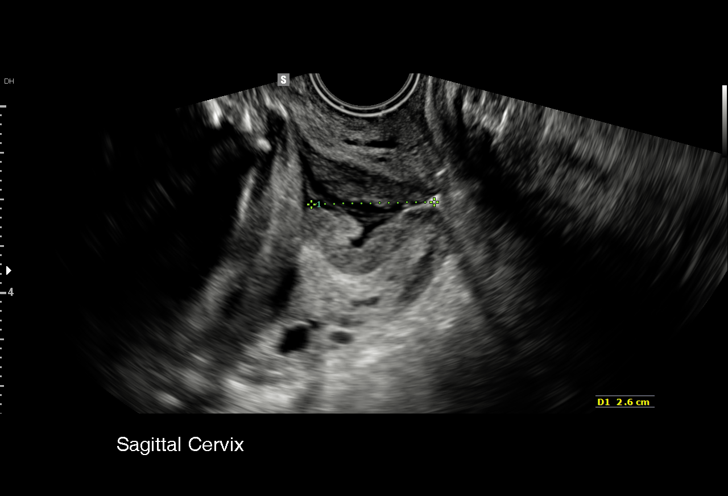

[14 of 28 positions shown; findings below may reference images not displayed]

MAU/Triage

1  DITSOTLHE              [PHONE_NUMBER]      [PHONE_NUMBER]     [PHONE_NUMBER]
2  DITSOTLHE              [PHONE_NUMBER]      [PHONE_NUMBER]     [PHONE_NUMBER]
Indications

29 weeks gestation of pregnancy
Abdominal pain in pregnancy                    [XV]
Insufficient Prenatal Care                     [XV]
Twin pregnancy, di/di, third trimester         [XV]
OB History

Gravidity:    2         Term:   1        Prem:   0        SAB:   0
TOP:          0       Ectopic:  0        Living: 1
Fetal Evaluation (Fetus A)

Num Of Fetuses:     2
Fetal Heart         129
Rate(bpm):
Cardiac Activity:   Observed
Fetal Lie:          Lower Fetus
Presentation:       Breech
Placenta:           Anterior, above cervical os
P. Cord Insertion:  Visualized, central
Membrane Desc:      Dividing Membrane seen - Dichorionic.

Amniotic Fluid
AFI FV:      Subjectively within normal limits

Largest Pocket(cm)
4.2
Gestational Age (Fetus A)

Clinical EDD:  29w 2d                                        EDD:   [DATE]
Best:          29w 2d    Det. By:   Clinical EDD             EDD:   [DATE]

Fetal Evaluation (Fetus B)

Num Of Fetuses:     2
Fetal Heart         106
Rate(bpm):
Cardiac Activity:   Observed
Fetal Lie:          Upper Fetus
Presentation:       Transverse, head to maternal right
Placenta:           Anterior, above cervical os
P. Cord Insertion:  Visualized, central
Membrane Desc:      Dividing Membrane seen - Dichorionic.

Amniotic Fluid
AFI FV:      Subjectively within normal limits

Largest Pocket(cm)
6.0
Gestational Age (Fetus B)

Clinical EDD:  29w 2d                                        EDD:   [DATE]
Best:          29w 2d    Det. By:   Clinical EDD             EDD:   [DATE]
Cervix Uterus Adnexa

Cervix
Length:            2.5  cm.
Measured transvaginally.

Left Ovary
Within normal limits.

Right Ovary
Within normal limits.

Adnexa:       No abnormality visualized.
Impression

Dichorionic/diamniotic twin pregnancy at 
 29 weeks
Normal amniotic fluid volume x 2
Anterior placentas; no previa; no subchorionic fluid
collections/hemorrhage identified
EV views of cervix: normal length (2.5 cms) without funneling
Recommendations

Follow-up ultrasound ASAP for detailed anatomic survey

## 2015-10-02 MED ORDER — FERROUS SULFATE 325 (65 FE) MG PO TABS: 325.0000 mg | ORAL_TABLET | Freq: Two times a day (BID) | ORAL | Status: DC

## 2015-10-02 MED ORDER — PRENATAL VITAMINS 0.8 MG PO TABS: 1.0000 | ORAL_TABLET | Freq: Every day | ORAL | Status: DC

## 2015-10-02 MED ORDER — METHADONE HCL 10 MG PO TABS
20.0000 mg | ORAL_TABLET | Freq: Every day | ORAL | Status: DC
  Administered 2015-10-02: 20 mg via ORAL

## 2015-10-02 MED ORDER — SODIUM CHLORIDE 0.9 % IV SOLN
510.0000 mg | Freq: Once | INTRAVENOUS | Status: AC
  Administered 2015-10-02: 510 mg via INTRAVENOUS
  Filled 2015-10-02: qty 17

## 2015-10-02 MED ORDER — METHADONE HCL 10 MG PO TABS
10.0000 mg | ORAL_TABLET | Freq: Once | ORAL | Status: AC
  Administered 2015-10-02: 10 mg via ORAL
  Filled 2015-10-02: qty 1

## 2015-10-02 NOTE — ED Notes (Signed)
Spoke with Magazine features editorChristine RN (rapid response [facility]) to notify of pt monitoring

## 2015-10-02 NOTE — ED Notes (Signed)
C/o bilat leg cramps x 3 days-lower back pain x today-pt is 6.5 months pregnant-denies abd pain, vaginal d/c or bleeding-did not inform OB MD-[facility]-has switched from a [facility] with no first visit in [place]-[facility]-steady gait

## 2015-10-02 NOTE — Discharge Instructions (Signed)
Iron Deficiency Anemia, Adult Anemia is when you have a low number of healthy red blood cells. It is often caused by too little iron. This is called iron deficiency anemia. It may make you tired and short of breath. HOME CARE   Take iron as told by your doctor.  Take vitamins as told by your doctor.  Eat foods that have iron in them. This includes liver, lean beef, whole-grain bread, eggs, dried fruit, and dark green leafy vegetables. GET HELP RIGHT AWAY IF:  You pass out (faint).  You have chest pain.  You feel sick to your stomach (nauseous) or throw up (vomit).  You get very short of breath with activity.  You are weak.  You have a fast heartbeat.  You start to sweat for no reason.  You become light-headed when getting up from a chair or bed. MAKE SURE YOU:  Understand these instructions.  Will watch your condition.  Will get help right away if you are not doing well or get worse.   This information is not intended to replace advice given to you by your health care provider. Make sure you discuss any questions you have with your health care provider.   Document Released: 05/09/2010 Document Revised: 04/27/2014 Document Reviewed: 12/12/2012 Elsevier Interactive Patient Education 2016 [employer]  

## 2015-10-02 NOTE — ED Provider Notes (Signed)
CSN: [id number]     Arrival date & time 10/02/15  1108 History   First MD Initiated Contact with Patient 10/02/15 1130     Chief Complaint  Patient presents with  . Leg Pain  . Pregnant    HPI   25 year old female presents today with lower extremity swelling edema and pain, back pain, and pregnancy.  Patient notes that she was institutionalized and released on 03/13/2015. She notes that after delivering a baby via C-section on 02/02/2012 she started using heroin. After beginning hair when she no longer had vaginal bleeding or menstrual cycles per patient. She notes that she started to gain weight after leaving the institution, but did not think that she was pregnant. Today patient reports that her abdomen is gravid, she has had intermittent lower extremity edema and pain with this pregnancy. She notes that 3 days ago she started to have right lower leg pain, this was located over the calf, tender to palpation, with pitting edema. She reports no symptoms have completely resolved at the time my evaluation. She reports the pain was radiating up into her back, and felt bilateral back cramping pain that she described as "Braxton Hicks contractions". She reports those are no longer happening. Patient denies any abdominal pain, vaginal bleeding or discharge, history of DVT or PE. She denies any chest pain, shortness of breath. Patient notes that she does smoke, still using heroin, last dose yesterday denies any alcohol or other drug use. She denies any chronic health conditions, and complicated previous pregnancy. Patient has not received any prenatal care testing prior to my evaluation. She denies any urinary complaints. She does not have an OB.   Past Medical History  Diagnosis Date  . Heroin abuse    Past Surgical History  Procedure Laterality Date  . Cesarean section  02/02/2012   No family history on file. Social History  Substance Use Topics  . Smoking status: Current Every Day Smoker -- 0.50  packs/day for 5 years    Types: Cigarettes  . Smokeless tobacco: Never Used  . Alcohol Use: No   OB History    Gravida Para Term Preterm AB TAB SAB Ectopic Multiple Living   1              Review of Systems  All other systems reviewed and are negative.     Allergies  Review of patient's allergies indicates no known allergies.  Home Medications   Prior to Admission medications   Medication Sig Start Date End Date Taking? Authorizing Provider  Prenatal Vit-Fe Fumarate-FA (PRENATAL MULTIVITAMIN) TABS tablet Take 1 tablet by mouth daily at 12 noon.   Yes Historical Provider, MD   BP 124/74 mmHg  Pulse 123  Temp(Src) 97.8 F (36.6 C) (Oral)  Resp 18  Ht 5\' 6"  (1.676 m)  Wt 65.318 kg  BMI 23.25 kg/m2  SpO2 100% Physical Exam  Constitutional: She is oriented to person, place, and time. She appears well-developed and well-nourished.  HENT:  Head: Normocephalic and atraumatic.  Eyes: Conjunctivae are normal. Pupils are equal, round, and reactive to light. Right eye exhibits no discharge. Left eye exhibits no discharge. No scleral icterus.  Neck: Normal range of motion. No JVD present. No tracheal deviation present.  Cardiovascular: Regular rhythm, normal heart sounds and intact distal pulses.  Exam reveals no gallop and no friction rub.   No murmur heard. Pulmonary/Chest: Effort normal and breath sounds normal. No stridor. No respiratory distress. She has no wheezes. She has no  rales. She exhibits no tenderness.  Abdominal:  Gravid abdomen slightly tender to palpation.   Musculoskeletal: She exhibits tenderness. She exhibits no edema.  No lower extremity swelling or edema. Significant tenderness to palpation of the right calf  Neurological: She is alert and oriented to person, place, and time. Coordination normal.  Skin: Skin is warm and dry. No rash noted. No erythema. No pallor.  Psychiatric: She has a normal mood and affect. Her behavior is normal. Judgment and thought  content normal.  Nursing note and vitals reviewed.   ED Course  Procedures (including critical care time) Labs Review Labs Reviewed - No data to display  Imaging Review No results found. I have personally reviewed and evaluated these images and lab results as part of my medical decision-making.   EKG Interpretation None      MDM   Final diagnoses:  Leg pain    Labs:HIV, RPR, urinalysis, BMP, CMP, type and screen  Imaging: Ultrasound OB complete, ultrasound venous lower extremity  Consults: OB/GYN Dr. Shawnie Pons  Therapeutics:  Discharge Meds:   Assessment/Plan: 25 year old female presents today with complaints of back pain, contractions, leg pain. Patient has no prenatal care, uncertain dates, drug user. Concern for contractions, patient placed on monitor, unable to clearly define mother's heart rate, baby's heart rate him a bit appears that he was heart related strep done in the 80s. OB/GYN immediately consulted, immediate healing can transfer in order. Denies any abdominal pain or active contractions. Also concern for right lower extremity swelling and edema, none presently, calf significantly tender, patient will need vascular ultrasound for DVT. No acute concern for PE at this time, no chest pain or shortness of breath. No vaginal bleeding. IV started, saline given, mother is slightly tachycardic. Labs drawn.  Patient evaluated prior to transfer, she is stable in no acute distress.           Eyvonne Mechanic, PA-C 10/02/15 1653  Leta Baptist, MD 10/09/15 [phone]

## 2015-10-02 NOTE — ED Notes (Signed)
Dr. Cyndie ChimeNguyen evaluating pt with bedside ultrasound

## 2015-10-02 NOTE — Progress Notes (Signed)
Pt keeps removing monitors. Have reapplied several times, but she keeps taking them off.

## 2015-10-02 NOTE — MAU Provider Note (Signed)
MAU HISTORY AND PHYSICAL  Chief Complaint:  Leg Pain and Pregnant    Christy Pearson is a 25 y.o.  G2P1000 with IUP at [redacted]w[redacted]d presenting for Leg Pain and Pregnant  Seen at [place] this afternoon, main complaint right calf pain present for 3 days, like a "charlie horse." No redness or swelling. No cough or sob. Uses heroin, recently incarcerated. Recently aware pregnant. lmp unsure, ~3 years ago. Sent over here as unable to accurately trace baby, thinks maybe 2 gestations. No abdominal pain or bleeding or lof. Does have bilateral diffuse low back pain, no dysuria or fevers, no weakness/numbness or bowel/bladder dysfunction.  Past Medical History  Diagnosis Date  . Heroin abuse     Past Surgical History  Procedure Laterality Date  . Cesarean section  02/02/2012    History reviewed. No pertinent family history.  Social History  Substance Use Topics  . Smoking status: Current Every Day Smoker -- 0.50 packs/day for 5 years    Types: Cigarettes  . Smokeless tobacco: Never Used  . Alcohol Use: No    No Known Allergies  Prescriptions prior to admission  Medication Sig Dispense Refill Last Dose  . gabapentin (NEURONTIN) 300 MG capsule Take 300 mg by mouth at bedtime.   10/01/2015 at Unknown time  . ibuprofen (ADVIL,MOTRIN) 800 MG tablet Take 800 mg by mouth every 8 (eight) hours as needed for moderate pain.   10/01/2015 at Unknown time    Review of Systems - Negative except for what is mentioned in HPI.  Physical Exam  Blood pressure 89/41, pulse 125, temperature 98.9 F (37.2 C), temperature source Oral, resp. rate 18, height  (1.676 m), weight 144 lb 4.8 oz (65.454 kg), SpO2 100 %. GENERAL: Well-developed, well-nourished female in no acute distress.  LUNGS: Clear to auscultation bilaterally.  HEART: Regular rate and rhythm. ABDOMEN: Soft, nontender, nondistended, gravid.  EXTREMITIES: Nontender, no edema, 2+ distal pulses. Cervical Exam: 1 cm dilated per Dr. Shawnie Pons exam FHT:   140s baby A, 115s baby b    Labs: Results for orders placed or performed during the hospital encounter of 10/02/15 (from the past 24 hour(s))  Comprehensive metabolic panel   Collection Time: 10/02/15 12:50 PM  Result Value Ref Range   Sodium 131 (L) 135 - 145 mmol/L   Potassium 4.2 3.5 - 5.1 mmol/L   Chloride 104 101 - 111 mmol/L   CO2 14 (L) 22 - 32 mmol/L   Glucose, Bld 60 (L) 65 - 99 mg/dL   BUN 6 6 - 20 mg/dL   Creatinine, Ser 1.61 0.44 - 1.00 mg/dL   Calcium 8.4 (L) 8.9 - 10.3 mg/dL   Total Protein 6.5 6.5 - 8.1 g/dL   Albumin 2.6 (L) 3.5 - 5.0 g/dL   AST 32 15 - 41 U/L   ALT 14 14 - 54 U/L   Alkaline Phosphatase 120 38 - 126 U/L   Total Bilirubin 1.9 (H) 0.3 - 1.2 mg/dL   GFR calc non Af Amer >60 >60 mL/min   GFR calc Af Amer >60 >60 mL/min   Anion gap 13 5 - 15  CBC with Differential   Collection Time: 10/02/15 12:50 PM  Result Value Ref Range   WBC 5.5 4.0 - 10.5 K/uL   RBC 3.21 (L) 3.87 - 5.11 MIL/uL   Hemoglobin 7.4 (L) 12.0 - 15.0 g/dL   HCT 09.6 (L) 04.5 - 40.9 %   MCV 74.5 (L) 78.0 - 100.0 fL   MCH 23.1 (  L) 26.0 - 34.0 pg   MCHC 31.0 30.0 - 36.0 g/dL   RDW 16.115.3 09.611.5 - 04.515.5 %   Platelets 366 150 - 400 K/uL   Neutrophils Relative % 93 %   Lymphocytes Relative 7 %   Monocytes Relative 0 %   Eosinophils Relative 0 %   Basophils Relative 0 %   Neutro Abs 5.1 1.7 - 7.7 K/uL   Lymphs Abs 0.4 (L) 0.7 - 4.0 K/uL   Monocytes Absolute 0.0 (L) 0.1 - 1.0 K/uL   Eosinophils Absolute 0.0 0.0 - 0.7 K/uL   Basophils Absolute 0.0 0.0 - 0.1 K/uL   RBC Morphology POLYCHROMASIA PRESENT    WBC Morphology INCREASED BANDS (>20% BANDS)    Smear Review LARGE PLATELETS PRESENT   ABO/Rh   Collection Time: 10/02/15 12:50 PM  Result Value Ref Range   ABO/RH(D) A POS    No rh immune globuloin      NOT A RH IMMUNE GLOBULIN CANDIDATE, PT RH POSITIVE Performed at [facility]   Urinalysis, Routine w reflex microscopic (not at [facility])   Collection Time: 10/02/15  2:20 PM   Result Value Ref Range   Color, Urine AMBER (A) YELLOW   APPearance CLEAR CLEAR   Specific Gravity, Urine 1.020 1.005 - 1.030   pH 6.0 5.0 - 8.0   Glucose, UA NEGATIVE NEGATIVE mg/dL   Hgb urine dipstick NEGATIVE NEGATIVE   Bilirubin Urine MODERATE (A) NEGATIVE   Ketones, ur >80 (A) NEGATIVE mg/dL   Protein, ur 30 (A) NEGATIVE mg/dL   Nitrite NEGATIVE NEGATIVE   Leukocytes, UA TRACE (A) NEGATIVE  Urine rapid drug screen (hosp performed)   Collection Time: 10/02/15  2:20 PM  Result Value Ref Range   Opiates POSITIVE (A) NONE DETECTED   Cocaine POSITIVE (A) NONE DETECTED   Benzodiazepines NONE DETECTED NONE DETECTED   Amphetamines POSITIVE (A) NONE DETECTED   Tetrahydrocannabinol NONE DETECTED NONE DETECTED   Barbiturates NONE DETECTED NONE DETECTED  Urine microscopic-add on   Collection Time: 10/02/15  2:20 PM  Result Value Ref Range   Squamous Epithelial / LPF 6-30 (A) NONE SEEN   WBC, UA 0-5 0 - 5 WBC/hpf   RBC / HPF NONE SEEN 0 - 5 RBC/hpf   Bacteria, UA MANY (A) NONE SEEN  Ferritin   Collection Time: 10/02/15  3:31 PM  Result Value Ref Range   Ferritin 8 (L) 11 - 307 ng/mL    Imaging Studies:  No results found.  Assessment/Plan: Christy Pearson is  25 y.o. G2P1000 at 10368w2d presents with Leg Pain and Pregnant   # leg pain: no redness or induration or swelling, no signs/symptoms PE, duplex u/s negative for dvt - dvt/pe return precautions  # twin gestation - u/s today shows di-di twins at close to 4029 wks gestation - start prenatal vitamin - referring to high risk clinic asap - prenatal labs obtained  # polysubstance abuse - uds positive for opioids (pt admits to heroin), cocaine, and amphetamines - pt counseled regarding dangers to mother and fetus - patient has already been referred to methadone clinic; this strongly encouraged  # Severe iron deficiency anemia - ferritin low, h 7.4 - feraheme 510 mg IV given - start ferrous sulfate bid - increase iron  consumption; handout given   Federico FlakeKimberly Niles Youcef Klas 6/14/20178:41 PM

## 2015-10-02 NOTE — ED Notes (Signed)
Baby hrt rate down to 98 and up to 150's. Pt has no sx.

## 2015-10-02 NOTE — ED Notes (Signed)
Spoke with Jacki ConesLaurie, RN MAU per Jerrye BeaversHazel, RN request. Explained that Dr. Shawnie PonsPratt had accepted the pt and [facility] was in process [facility]. Per Wynona Caneshristine, RN (rapid response) they were not able to adequately evaluate pt by via remote monitor because the tracings were not clear and wanted the pt brought over for a better evaluation. Pt was on the [facility] truck at the time of this conversation. Jacki ConesLaurie stated that Dr. Shawnie PonsPratt "could not accept the patient without the approval of the neonatologist". Marion DownerScott Bennett, RN ([facility]) who was in department at that time updated and then called MAU to discuss situation

## 2015-10-02 NOTE — ED Notes (Signed)
Pt c/o intermittant low back pain that she states feels like contractions. Fetal hrt rate 100 to 166. Pt [place] and was [place] in November. She states she did not know she was pregnant until recently and has not been to see an OBGYN yet. Pt has lost 42 lbs recently. Has not had a period in 3 years. G2 P1. Mother and grandmother at bedside.

## 2015-10-02 NOTE — ED Notes (Signed)
[facility]. [facility]. [facility]

## 2015-10-02 NOTE — Progress Notes (Signed)
Spoke with Dr Cyndie ChimeNguyen, informed of [facility] dr Shawnie Ponspratt, to have nurses continue to try and get a fetal tracing, Dr Cyndie ChimeNguyen did bedside ultrasound, and pt fells baby moving. Dr Cyndie ChimeNguyen will call dr Shawnie Ponspratt.

## 2015-10-02 NOTE — ED Notes (Signed)
[facility]P with [facility].

## 2015-10-02 NOTE — Progress Notes (Signed)
Paged vascular to get results of ultrasound.  Waiting to hear back

## 2015-10-02 NOTE — ED Notes (Signed)
Attempt to give report to [facility] with [facility]. RN wanted to know if rapid response had talked with Dr Shawnie PonsPratt and NICU. This RN unaware so phone call turned over to Joss RN charge RN who spoke with Rapid response RN.

## 2015-10-02 NOTE — Progress Notes (Signed)
VASCULAR LAB PRELIMINARY  PRELIMINARY  PRELIMINARY  PRELIMINARY  Right lower extremity venous duplex completed.    Preliminary report:  Right:  No evidence of DVT, superficial thrombosis, or Baker's cyst.  Dmarco Baldus, RVS 10/02/2015, 6:59 PM

## 2015-10-02 NOTE — Progress Notes (Addendum)
Called Joss RN in regards to fetal tracing, unable to determine between FHR baseline and MHR to readjust, place pt on her side, instructed to feel pt's pulse and adjust monitor. Need a better tracing, DR pratt called and made aware of FHR tracing.

## 2015-10-02 NOTE — ED Notes (Signed)
Spoke with Christy Caneshristine, RN rapid [facility] at [facility]

## 2015-10-02 NOTE — ED Notes (Signed)
Pt positioned on left side.

## 2015-10-03 LAB — RPR, QUANT+TP ABS (REFLEX)
Rapid Plasma Reagin, Quant: 1:4 {titer} — ABNORMAL HIGH
T Pallidum Abs: NEGATIVE

## 2015-10-03 LAB — GC/CHLAMYDIA PROBE AMP (~~LOC~~) NOT AT ARMC
Chlamydia: NEGATIVE
Neisseria Gonorrhea: NEGATIVE

## 2015-10-03 LAB — RPR: RPR Ser Ql: REACTIVE — AB

## 2015-10-03 LAB — CULTURE, OB URINE: Culture: NO GROWTH

## 2015-10-03 LAB — RUBELLA SCREEN: Rubella: 1.8 index (ref >0.99)

## 2015-10-03 LAB — HIV ANTIBODY (ROUTINE TESTING W REFLEX): HIV Screen 4th Generation wRfx: NONREACTIVE

## 2015-10-05 ENCOUNTER — Telehealth (HOSPITAL_BASED_OUTPATIENT_CLINIC_OR_DEPARTMENT_OTHER): Payer: Self-pay

## 2015-10-05 NOTE — Telephone Encounter (Signed)
Pt with + RPR . [facility] which will try to get pt in for treatment d/t pregnanacy and no PCP or OB. Chart Referred to EDP and per Effie ShyWentz, [facility] f/u is appropriate.

## 2015-10-28 ENCOUNTER — Encounter: Payer: Self-pay | Admitting: Obstetrics & Gynecology

## 2015-10-28 ENCOUNTER — Encounter: Payer: Self-pay | Admitting: Obstetrics and Gynecology

## 2015-10-28 ENCOUNTER — Ambulatory Visit (INDEPENDENT_AMBULATORY_CARE_PROVIDER_SITE_OTHER): Payer: Self-pay | Admitting: Obstetrics and Gynecology

## 2015-10-28 VITALS — BP 110/62 | HR 101 | Wt 151.0 lb

## 2015-10-28 DIAGNOSIS — Z9289 Personal history of other medical treatment: Secondary | ICD-10-CM | POA: Insufficient documentation

## 2015-10-28 DIAGNOSIS — O0993 Supervision of high risk pregnancy, unspecified, third trimester: Secondary | ICD-10-CM | POA: Insufficient documentation

## 2015-10-28 DIAGNOSIS — O99323 Drug use complicating pregnancy, third trimester: Secondary | ICD-10-CM

## 2015-10-28 DIAGNOSIS — O30043 Twin pregnancy, dichorionic/diamniotic, third trimester: Secondary | ICD-10-CM

## 2015-10-28 DIAGNOSIS — D649 Anemia, unspecified: Secondary | ICD-10-CM

## 2015-10-28 DIAGNOSIS — Z98891 History of uterine scar from previous surgery: Secondary | ICD-10-CM

## 2015-10-28 DIAGNOSIS — O99019 Anemia complicating pregnancy, unspecified trimester: Secondary | ICD-10-CM

## 2015-10-28 DIAGNOSIS — F1910 Other psychoactive substance abuse, uncomplicated: Secondary | ICD-10-CM

## 2015-10-28 DIAGNOSIS — O34219 Maternal care for unspecified type scar from previous cesarean delivery: Secondary | ICD-10-CM

## 2015-10-28 DIAGNOSIS — F1990 Other psychoactive substance use, unspecified, uncomplicated: Secondary | ICD-10-CM

## 2015-10-28 DIAGNOSIS — O99013 Anemia complicating pregnancy, third trimester: Secondary | ICD-10-CM | POA: Insufficient documentation

## 2015-10-28 DIAGNOSIS — B1920 Unspecified viral hepatitis C without hepatic coma: Secondary | ICD-10-CM

## 2015-10-28 DIAGNOSIS — B182 Chronic viral hepatitis C: Secondary | ICD-10-CM

## 2015-10-28 LAB — POCT URINALYSIS DIP (DEVICE)
BILIRUBIN URINE: NEGATIVE
GLUCOSE, UA: NEGATIVE mg/dL
Hgb urine dipstick: NEGATIVE
KETONES UR: NEGATIVE mg/dL
Nitrite: NEGATIVE
PH: 5.5 (ref 5.0–8.0)
Protein, ur: NEGATIVE mg/dL
Specific Gravity, Urine: 1.015 (ref 1.005–1.030)
Urobilinogen, UA: 0.2 mg/dL (ref 0.0–1.0)

## 2015-10-28 NOTE — Progress Notes (Signed)
New OB Note  10/28/2015   Chief Complaint: NOB visit  Transfer of Care Patient: no  History of Present Illness: Ms. Christy Pearson is a 25 y.o. G2P1001 @ 33/0 weeks (EDC 8/28, based on 29wk u/s)  ), with the above CC. Preg complicated by has History of cesarean section; Polysubstance abuse; Dichorionic diamniotic twin gestation; Anemia affecting pregnancy; Hepatitis C; History of RPR test +; and Anemia affecting pregnancy in third trimester on her problem list.   She has Negative signs or symptoms of miscarriage or preterm labor  Patient seen in MAU on 6/14. OB labs drawn and IV ferraheme given for Hct 24. UDS +cocaine, heroin and THC.   ROS: A 12-point review of systems was performed and negative, except as stated in the above HPI.  OBGYN History: As per HPI. OB History  Gravida Para Term Preterm AB SAB TAB Ectopic Multiple Living  0 0 0 0 0 0 1    # Outcome Date GA Lbr Len/2nd Weight Sex Delivery Anes PTL Lv  2 Current           1 Term 02/02/12 [redacted]w[redacted]d  7 lb 11 oz (3.487 kg) M CS-Unspec Spinal N Y    She states she had scheduled term [facility]section at [facility]. States pregnancy uncomplicated and not sure why she had it done  Any prior children are healthy, doing well, without any problems or issues: yes History of abnormal pap smears: No. Last pap smear with that pregnancy, per patient   Past Medical History: Past Medical History  Diagnosis Date  . Heroin abuse   . Hepatitis C     Past Surgical History: Past Surgical History  Procedure Laterality Date  . Cesarean section  02/02/2012    Family History:  History reviewed. No pertinent family history. She denies any history of mental retardation, birth defects or genetic disorders in her or the FOB's history  Social History:  Social History   Social History  . Marital Status: Married    Spouse Name: N/A  . Number of Children: N/A  . Years of Education: N/A   Occupational History  . Not on file.   Social History Main  Topics  . Smoking status: Current Every Day Smoker -- 0.50 packs/day for 5 years    Types: Cigarettes  . Smokeless tobacco: Never Used  . Alcohol Use: No  . Drug Use: Yes    Special: Heroin     Comment: methadone   . Sexual Activity: Not Currently   Other Topics Concern  . Not on file   Social History Narrative   FOB not involved. Different FOB than G1 pregnancy. Patient doesn't feel threatened. Lives with mom Goes to [facility] in [place] for methadone  Allergy: No Known Allergies  Current Outpatient Medications: Methadone  qday PNV  Physical Exam:   BP 110/62 mmHg  Pulse 101  Wt 151 lb (68.493 kg) Body mass index is 24.38 kg/(m^2). FHTs: normal x 2  General appearance: Well nourished, well developed female in no acute distress.  Cardiovascular: S1, S2 normal, no murmur, rub or gallop, regular rate and rhythm Respiratory:  Clear to auscultation bilateral. Normal respiratory effort Abdomen: positive bowel sounds and no masses, hernias; diffusely non tender to palpation, non distended Neuro/Psych:  Normal mood and affect.  Skin:  Warm and dry.   Laboratory: As above 6/14: RI/HepB surf Ag neg, Ucx neg, cmp neg, hiv neg, GC-CT +RPR with neg T. Pall A pos  Imaging:  6/14:  limited u/s in MAU showed di-di twins with normal MVP x 2  Assessment: Pt stable with di-di twins  Plan: 1. History of [facility] section [facility]. Pt interested in [facility]  2. Polysubstance abuse Seen by Marijean NiemannJaime in SW briefly today and states she will f/u with her tomorrow at her 1h GCT.  - Pain Mgmt, Profile 6 Conf w/o mM, U  3. Dichorionic diamniotic twin pregnancy in third trimester Anatomy and growth u/s ordered ASAP 1hr GCT to be done tomorrow Delivery modes d/w pt and she is amenable to trying for TOLAC See above - US MFM OB COMP + 14 WK; Future - US MFM OB COMP ADDL GEST + 14 WK; Future  4. Anemia affecting pregnancy Pt asked to get labs today but states she  wants to get them tomorrow with her 1hr GCT - CBC  5. Hepatitis C virus infection without hepatic coma, unspecified chronicity Pt states she's had HepC for years; she doesn't have a GI doctor. Normal CMP last month. HepC quant. If quant neg, get HepC Ab testing as there is none in the system. If any are pos, refer to GI and MFM for consideration for medications since third trimester - Hepatitis C RNA quantitative  6. Chronic hepatitis C without hepatic coma (HCC) See above  7. History of RPR test + Neg T. Pall testing. F/u admit RPR. Rpt at 6wk PPV  8. Anemia affecting pregnancy in third trimester F/u CBC from today   Problem list reviewed and updated.  Follow up in 1 weeks.  >50% of 30 min visit spent on counseling and coordination of care.     Cornelia Copaharlie Jaloni Sorber, Jr. MD Attending Center for [facility](Faculty Practice)

## 2015-10-29 ENCOUNTER — Encounter: Payer: Self-pay | Admitting: Obstetrics & Gynecology

## 2015-10-29 ENCOUNTER — Institutional Professional Consult (permissible substitution): Payer: Self-pay

## 2015-10-29 ENCOUNTER — Other Ambulatory Visit: Payer: Self-pay

## 2015-10-30 ENCOUNTER — Encounter: Payer: Self-pay | Admitting: *Deleted

## 2015-11-02 LAB — PAIN MGMT, PROFILE 6 CONF W/O MM, U
6 Acetylmorphine: NEGATIVE ng/mL (ref <10)
ALPHAHYDROXYALPRAZOLAM: 77 ng/mL — AB (ref <25)
ALPHAHYDROXYMIDAZOLAM: NEGATIVE ng/mL (ref <50)
ALPHAHYDROXYTRIAZOLAM: NEGATIVE ng/mL (ref <50)
AMINOCLONAZEPAM: NEGATIVE ng/mL (ref <25)
Alcohol Metabolites: NEGATIVE ng/mL (ref <500)
Amphetamines: NEGATIVE ng/mL (ref <500)
BARBITURATES: NEGATIVE ng/mL (ref <300)
BENZODIAZEPINES: POSITIVE ng/mL — AB (ref <100)
Cocaine Metabolite: NEGATIVE ng/mL (ref <150)
Creatinine: 100.8 mg/dL (ref >=20.0)
HYDROXYETHYLFLURAZEPAM: NEGATIVE ng/mL (ref <50)
Lorazepam: NEGATIVE ng/mL (ref <50)
METHADONE METABOLITE: NEGATIVE ng/mL (ref <100)
Marijuana Metabolite: NEGATIVE ng/mL (ref <20)
Nordiazepam: NEGATIVE ng/mL (ref <50)
OPIATES: NEGATIVE ng/mL (ref <100)
OXAZEPAM: NEGATIVE ng/mL (ref <50)
OXYCODONE: NEGATIVE ng/mL (ref <100)
Oxidant: NEGATIVE ug/mL (ref <200)
PHENCYCLIDINE: NEGATIVE ng/mL (ref <25)
Please note:: 0
Temazepam: NEGATIVE ng/mL (ref <50)
pH: 5.77 (ref 4.5–9.0)

## 2015-11-05 NOTE — Progress Notes (Signed)
This encounter was created in error - please disregard.

## 2015-11-06 ENCOUNTER — Ambulatory Visit (HOSPITAL_COMMUNITY): Admission: RE | Admit: 2015-11-06 | Payer: Self-pay | Source: Ambulatory Visit

## 2015-11-06 ENCOUNTER — Encounter: Payer: Self-pay | Admitting: Obstetrics & Gynecology

## 2015-11-12 ENCOUNTER — Encounter (HOSPITAL_COMMUNITY): Payer: Self-pay

## 2015-11-12 ENCOUNTER — Other Ambulatory Visit: Payer: Self-pay | Admitting: Obstetrics and Gynecology

## 2015-11-12 ENCOUNTER — Ambulatory Visit (HOSPITAL_COMMUNITY)
Admission: RE | Admit: 2015-11-12 | Discharge: 2015-11-12 | Disposition: A | Discharge status: Home / Self Care | Payer: Medicaid Other | Source: Ambulatory Visit | Attending: Obstetrics and Gynecology | Admitting: Obstetrics and Gynecology

## 2015-11-12 DIAGNOSIS — Z3689 Encounter for other specified antenatal screening: Secondary | ICD-10-CM

## 2015-11-12 DIAGNOSIS — O99323 Drug use complicating pregnancy, third trimester: Secondary | ICD-10-CM | POA: Diagnosis not present

## 2015-11-12 DIAGNOSIS — O30043 Twin pregnancy, dichorionic/diamniotic, third trimester: Secondary | ICD-10-CM

## 2015-11-12 DIAGNOSIS — Z36 Encounter for antenatal screening of mother: Secondary | ICD-10-CM | POA: Diagnosis not present

## 2015-11-12 DIAGNOSIS — O34219 Maternal care for unspecified type scar from previous cesarean delivery: Secondary | ICD-10-CM | POA: Insufficient documentation

## 2015-11-12 DIAGNOSIS — Z3A35 35 weeks gestation of pregnancy: Secondary | ICD-10-CM | POA: Diagnosis not present

## 2015-11-12 DIAGNOSIS — O0933 Supervision of pregnancy with insufficient antenatal care, third trimester: Secondary | ICD-10-CM

## 2015-11-12 IMAGING — US US MFM OB COMP ADDL GEST +14 WKS
1 series · 12 of 28 positions shown · non-contrast
Comparison: none

[Series 1: us mfm ob comp addl gest +14 wks · 12 of 116 slices shown]
[im 5/116]
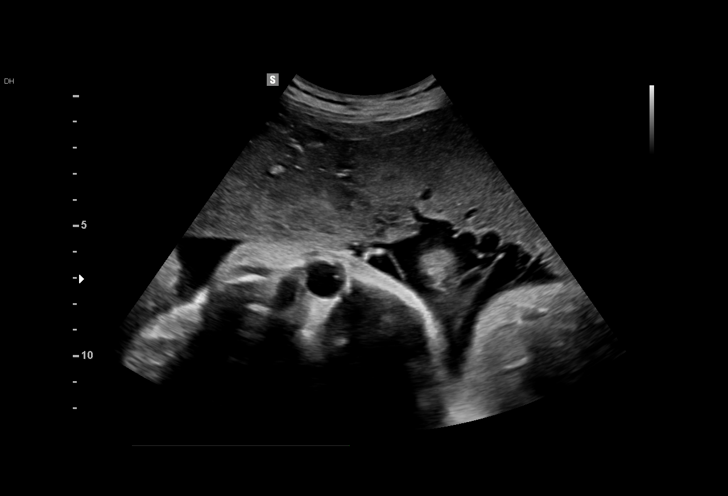
[im 13/116]
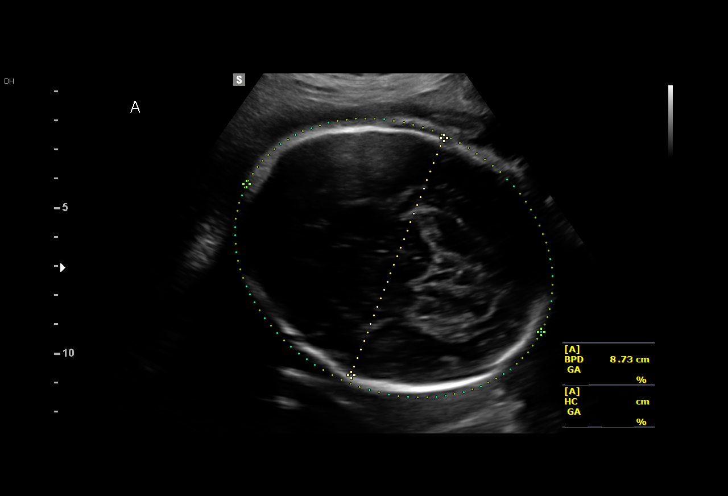
[im 22/116]
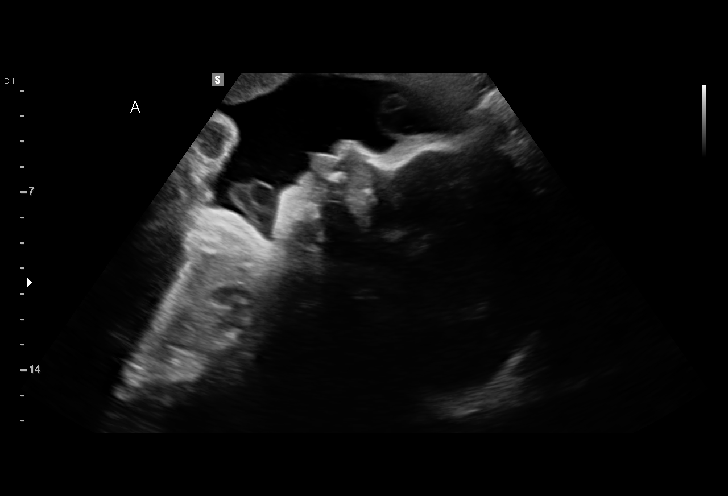
[im 35/116]
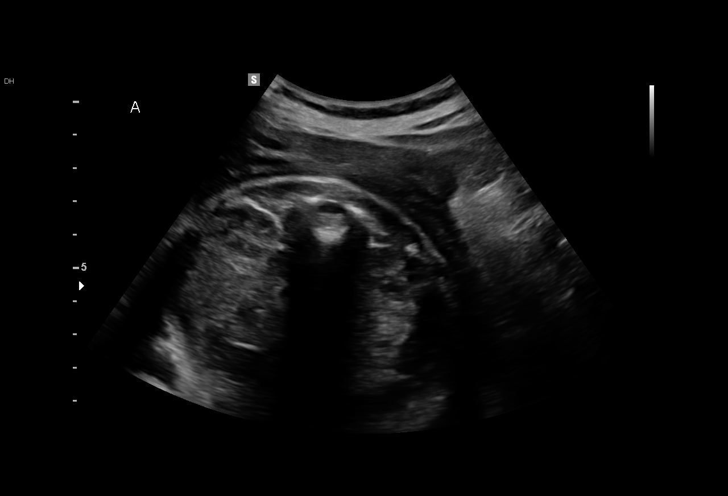
[im 43/116]
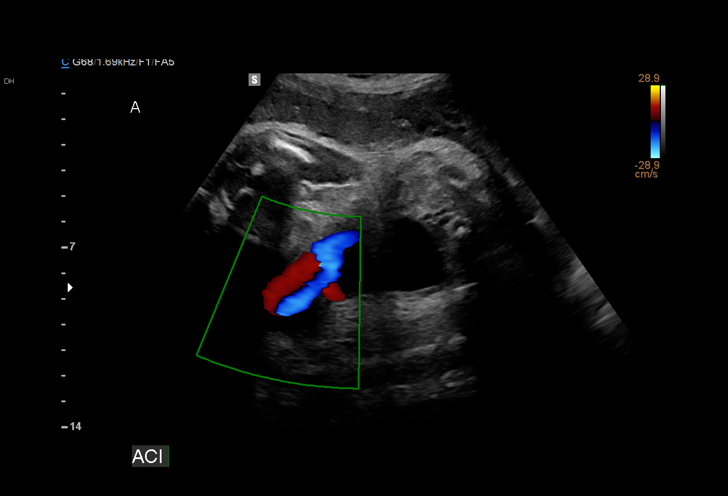
[im 52/116]
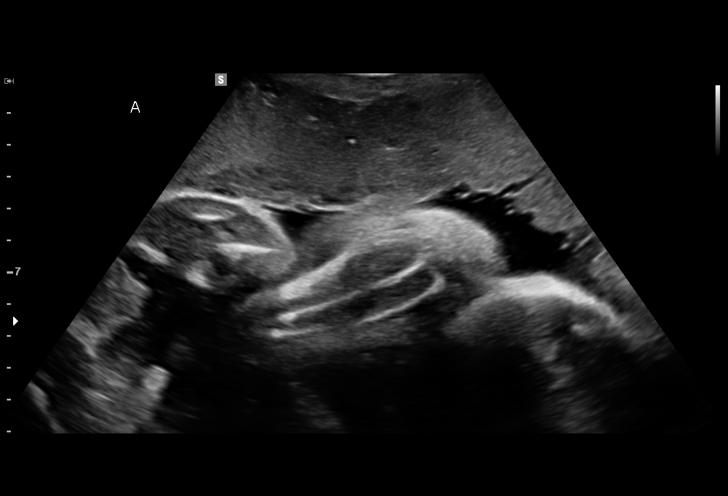
[im 64/116]
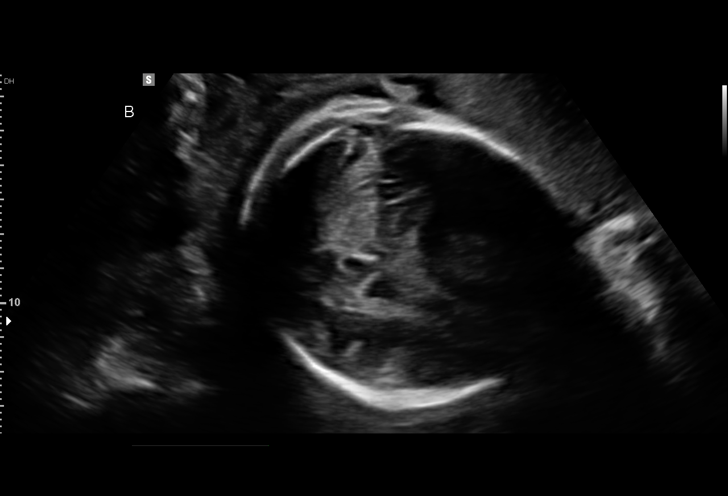
[im 73/116]
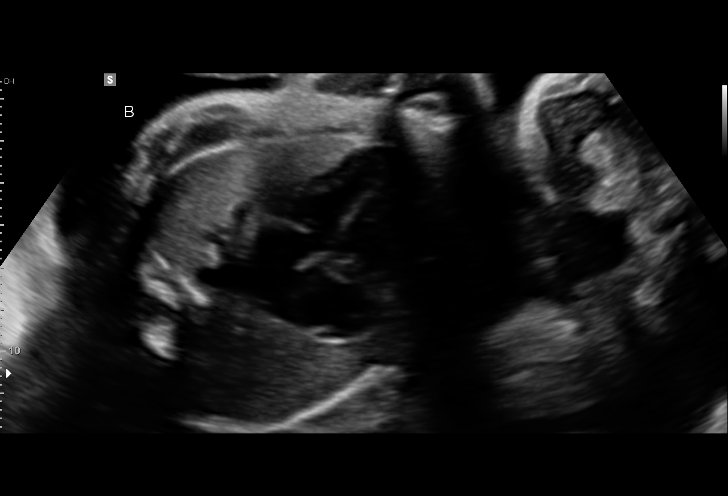
[im 81/116]
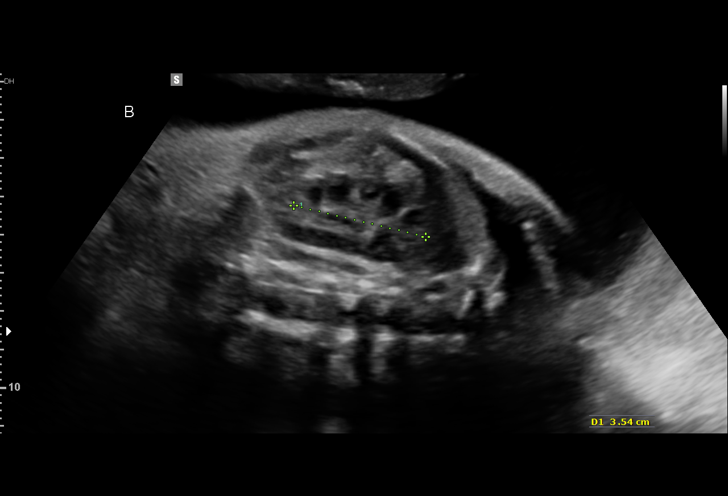
[im 94/116]
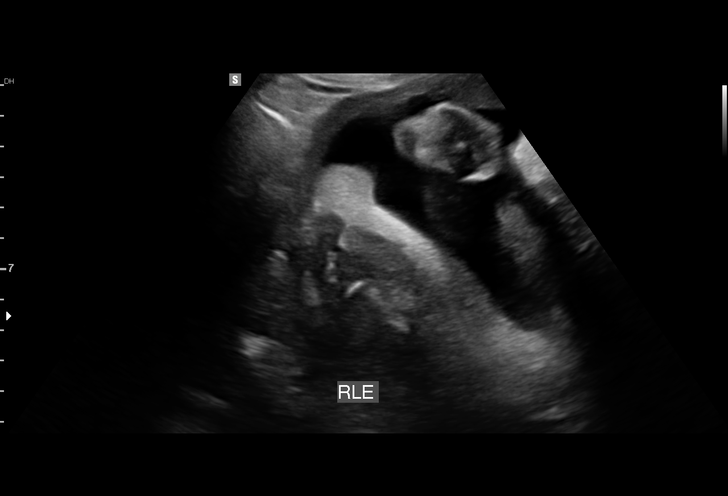
[im 103/116]
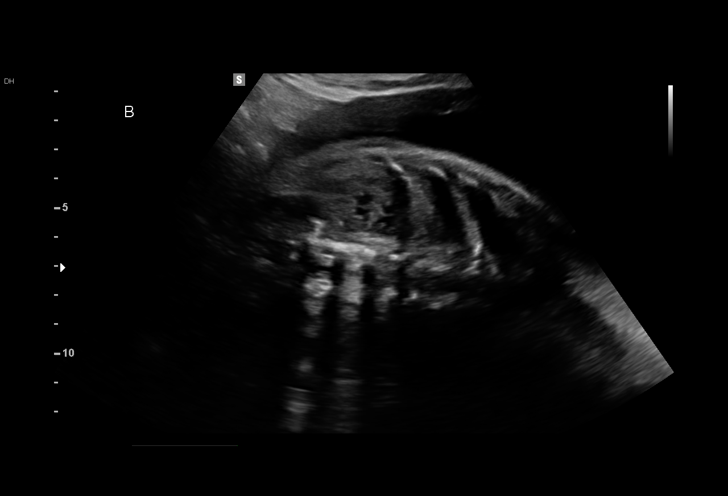
[im 111/116]
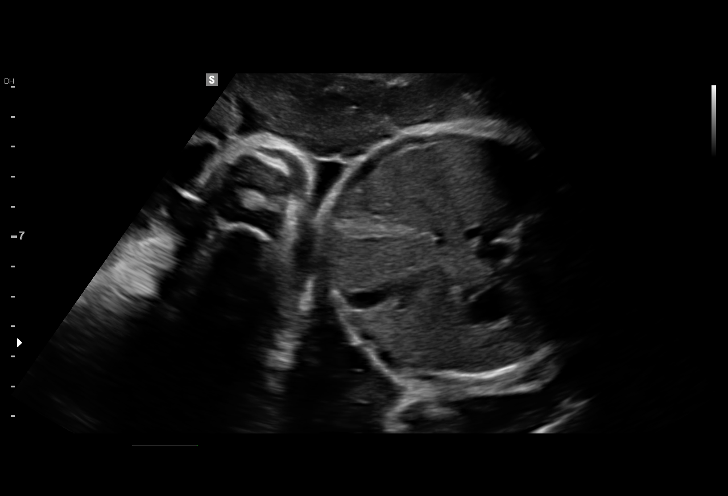

[12 of 28 positions shown; findings below may reference images not displayed]

pm)

Hospital Clinic-
Faculty Physician
OB/Gyn Clinic
[REDACTED]

WK

1  LA FLEUR          [PHONE_NUMBER]      [PHONE_NUMBER]     [PHONE_NUMBER]
2  LA FLEUR          [PHONE_NUMBER]      [PHONE_NUMBER]     [PHONE_NUMBER]
Indications

35 weeks gestation of pregnancy
Basic anatomic survey                          Z36
Twin pregnancy, di/di, third trimester         [5Q]
Insufficient Prenatal Care                     [5Q]
Drug use complicating pregnancy, third         [5Q]
trimester (methadone, opiates, cocaine)
Previous cesarean delivery, antepartum         [5Q]
OB History

Gravidity:    2         Term:   1        Prem:   0        SAB:   0
TOP:          0       Ectopic:  0        Living: 1
Fetal Evaluation (Fetus A)

Num Of Fetuses:     2
Fetal Heart         121
Rate(bpm):
Cardiac Activity:   Observed
Fetal Lie:          Lower left Fetus
Presentation:       Cephalic
Placenta:           Anterior, above cervical os
P. Cord Insertion:  Previously Visualized
Membrane Desc:      Dividing Membrane seen - Dichorionic.

Amniotic Fluid
AFI FV:      Subjectively within normal limits

Largest Pocket(cm)
4.6
Biometry (Fetus A)

BPD:      86.7  mm     G. Age:  35w 0d         49  %    CI:        72.92   %    70 - 86
FL/HC:      19.9   %    20.1 -
HC:      322.8  mm     G. Age:  36w 3d         50  %    HC/AC:      1.07        0.93 -
AC:      301.1  mm     G. Age:  34w 1d         27  %    FL/BPD:     73.9   %    71 - 87
FL:       64.1  mm     G. Age:  33w 1d          6  %    FL/AC:      21.3   %    20 - 24
HUM:      58.5  mm     G. Age:  33w 6d         41  %
CER:      42.7  mm     G. Age:  36w 5d         64  %

CM:        5.9  mm

Est. FW:    [5Q]  gm      5 lb 3 oz     41  %     FW Discordancy         5  %
Gestational Age (Fetus A)

Clinical EDD:  35w 1d                                        EDD:   [DATE]
U/S Today:     34w 5d                                        EDD:   [DATE]
Best:          35w 1d     Det. By:  Clinical EDD             EDD:   [DATE]
Anatomy (Fetus A)

Cranium:               Appears normal         Aortic Arch:            Not well visualized
Cavum:                 Not well visualized    Ductal Arch:            Not well visualized
Ventricles:            Appears normal         Diaphragm:              Appears normal
Choroid Plexus:        Appears normal         Stomach:                Appears normal, left
sided
Cerebellum:            Appears normal         Abdomen:                Fetal Cholelithiasis
Posterior Fossa:       Appears normal         Abdominal Wall:         Not well visualized
Nuchal Fold:           Not applicable (>20    Cord Vessels:           Appears normal (3
wks GA)                                        vessel cord)
Face:                  Appears normal         Kidneys:                Appear normal
(orbits and profile)
Lips:                  Appears normal         Bladder:                Appears normal
Thoracic:              Appears normal         Spine:                  Appears normal
Heart:                 Appears normal         Upper Extremities:      Visualized
(4CH, axis, and
situs)
RVOT:                  Not well visualized    Lower Extremities:      Visualized
LVOT:                  Not well visualized

Other:  Fetus appears to be a female. Technically difficult due to advanced
GA and fetal position.

Fetal Evaluation (Fetus B)

Num Of Fetuses:     2
Fetal Heart         120
Rate(bpm):
Cardiac Activity:   Observed
Fetal Lie:          Upper right Fetus
Presentation:       Cephalic
Placenta:           Anterior, above cervical os
P. Cord Insertion:  Previously Visualized
Membrane Desc:      Dividing Membrane seen - Dichorionic.

Amniotic Fluid
AFI FV:      Subjectively within normal limits

Largest Pocket(cm)
5.7
Biometry (Fetus B)

BPD:      80.6  mm     G. Age:  32w 2d          2  %    CI:        70.08   %    70 - 86
FL/HC:      22.2   %    20.1 -
HC:      307.1  mm     G. Age:  34w 2d          7  %    HC/AC:      0.99        0.93 -
AC:      311.3  mm     G. Age:  35w 0d         55  %    FL/BPD:     84.5   %    71 - 87
FL:       68.1  mm     G. Age:  35w 0d         39  %    FL/AC:      21.9   %    20 - 24
HUM:      59.5  mm     G. Age:  34w 4d         52  %
CER:      42.1  mm     G. Age:  36w 1d         59  %
CM:        7.4  mm

Est. FW:    [5Q]  gm      5 lb 8 oz     52  %     FW Discordancy      0 \ 5 %
Gestational Age (Fetus B)

Clinical EDD:  35w 1d                                        EDD:   [DATE]
U/S Today:     34w 1d                                        EDD:   [DATE]
Best:          35w 1d     Det. By:  Clinical EDD             EDD:   [DATE]
Anatomy (Fetus B)

Cranium:               Appears normal         Aortic Arch:            Not well visualized
Cavum:                 Appears normal         Ductal Arch:            Not well visualized
Ventricles:            Appears normal         Diaphragm:              Appears normal
Choroid Plexus:        Appears normal         Stomach:                Appears normal, left
sided
Cerebellum:            Appears normal         Abdomen:                Appears normal
Posterior Fossa:       Appears normal         Abdominal Wall:         Appears nml (cord
insert, abd wall)
Nuchal Fold:           Not applicable (>20    Cord Vessels:           Appears normal (3
wks GA)                                        vessel cord)
Face:                  Orbits nl; profile not Kidneys:                Appear normal
well visualized
Lips:                  Not well visualized    Bladder:                Appears normal
Thoracic:              Appears normal         Spine:                  Limited views
appear normal
Heart:                 Appears normal         Upper Extremities:      Visualized
(4CH, axis, and
situs)
RVOT:                  Not well visualized    Lower Extremities:      Visualized
LVOT:                  Not well visualized

Other:  Fetus appears to be a male. Technically difficult due to advanced GA
and fetal position.
Cervix Uterus Adnexa

Cervix
Not visualized (advanced GA >[5Q])
Adnexa:       No abnormality visualized.
Impression

Di/Di twin gestation at 35 weeks 1 day gestation with fetal
cardiac activity x2
Cephalic cephalic presentation
Normal appearing fetal growth and amniotic fluid x2
5% discordance
No apparent birth defects noted although a significant portion
of the fetal anatomy was not well visualized secondary to
advanced gestation and fetal position
Fetal gallstones noted in Twin A
Recommendations

Follow up ultrasounds as clinically indicated
Antenatal testing per primary provider

## 2015-11-15 ENCOUNTER — Encounter: Payer: Self-pay | Admitting: Obstetrics and Gynecology

## 2015-11-15 DIAGNOSIS — Z9119 Patient's noncompliance with other medical treatment and regimen: Secondary | ICD-10-CM | POA: Insufficient documentation

## 2015-11-15 DIAGNOSIS — Z91199 Patient's noncompliance with other medical treatment and regimen due to unspecified reason: Secondary | ICD-10-CM | POA: Insufficient documentation

## 2015-11-21 ENCOUNTER — Encounter: Payer: Self-pay | Admitting: Obstetrics and Gynecology

## 2015-11-21 NOTE — Progress Notes (Signed)
Patient did not keep 11/21/2015 OB appointment  Cornelia Copa MD Attending [facility])

## 2015-11-28 ENCOUNTER — Encounter: Payer: Self-pay | Admitting: Obstetrics & Gynecology

## 2015-12-09 ENCOUNTER — Encounter: Payer: Self-pay | Admitting: Obstetrics and Gynecology

## 2015-12-10 ENCOUNTER — Encounter: Payer: Self-pay | Admitting: Family Medicine

## 2015-12-30 ENCOUNTER — Emergency Department (HOSPITAL_COMMUNITY)
Admission: EM | Admit: 2015-12-30 | Discharge: 2015-12-30 | Disposition: A | Discharge status: Home / Self Care | Payer: Medicaid Other | Attending: Emergency Medicine | Admitting: Emergency Medicine

## 2015-12-30 ENCOUNTER — Encounter (HOSPITAL_COMMUNITY): Payer: Self-pay

## 2015-12-30 DIAGNOSIS — F13239 Sedative, hypnotic or anxiolytic dependence with withdrawal, unspecified: Secondary | ICD-10-CM | POA: Insufficient documentation

## 2015-12-30 DIAGNOSIS — F1123 Opioid dependence with withdrawal: Secondary | ICD-10-CM | POA: Insufficient documentation

## 2015-12-30 DIAGNOSIS — Z5321 Procedure and treatment not carried out due to patient leaving prior to being seen by health care provider: Secondary | ICD-10-CM | POA: Insufficient documentation

## 2015-12-30 DIAGNOSIS — F17210 Nicotine dependence, cigarettes, uncomplicated: Secondary | ICD-10-CM | POA: Diagnosis not present

## 2015-12-30 LAB — COMPREHENSIVE METABOLIC PANEL
ALK PHOS: 105 U/L (ref 38–126)
ALT: 49 U/L (ref 14–54)
AST: 68 U/L — ABNORMAL HIGH (ref 15–41)
Albumin: 3.3 g/dL — ABNORMAL LOW (ref 3.5–5.0)
Anion gap: 10 (ref 5–15)
BILIRUBIN TOTAL: 0.4 mg/dL (ref 0.3–1.2)
BUN: 7 mg/dL (ref 6–20)
CO2: 24 mmol/L (ref 22–32)
CREATININE: 0.83 mg/dL (ref 0.44–1.00)
Calcium: 9.2 mg/dL (ref 8.9–10.3)
Chloride: 105 mmol/L (ref 101–111)
GFR calc non Af Amer: >60 mL/min (ref >60)
GLUCOSE: 148 mg/dL — AB (ref 65–99)
Potassium: 4 mmol/L (ref 3.5–5.1)
SODIUM: 139 mmol/L (ref 135–145)
TOTAL PROTEIN: 6.9 g/dL (ref 6.5–8.1)

## 2015-12-30 LAB — CBC
HCT: 33.3 % — ABNORMAL LOW (ref 36.0–46.0)
Hemoglobin: 9.7 g/dL — ABNORMAL LOW (ref 12.0–15.0)
MCH: 22.2 pg — AB (ref 26.0–34.0)
MCHC: 29.1 g/dL — ABNORMAL LOW (ref 30.0–36.0)
MCV: 76.4 fL — AB (ref 78.0–100.0)
PLATELETS: 308 10*3/uL (ref 150–400)
RBC: 4.36 MIL/uL (ref 3.87–5.11)
RDW: 21.2 % — AB (ref 11.5–15.5)
WBC: 4.6 10*3/uL (ref 4.0–10.5)

## 2015-12-30 LAB — ETHANOL: Alcohol, Ethyl (B): <5 mg/dL (ref <5)

## 2015-12-30 NOTE — ED Triage Notes (Signed)
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 Reports last use yesterday at 1400. Pt is calmly sitting in the triage room with NAD noted at this time.

## 2015-12-30 NOTE — ED Notes (Signed)
Called pt again.  No answer.

## 2015-12-30 NOTE — ED Notes (Signed)
Called pt.x2 noanswer . To update on vitalsigns

## 2017-07-01 ENCOUNTER — Encounter (HOSPITAL_COMMUNITY): Payer: Self-pay

## 2017-07-01 ENCOUNTER — Emergency Department (HOSPITAL_COMMUNITY)
Admission: EM | Admit: 2017-07-01 | Discharge: 2017-07-01 | Attending: Emergency Medicine | Admitting: Emergency Medicine

## 2017-07-01 DIAGNOSIS — X58XXXA Exposure to other specified factors, initial encounter: Secondary | ICD-10-CM | POA: Diagnosis not present

## 2017-07-01 DIAGNOSIS — Y9389 Activity, other specified: Secondary | ICD-10-CM | POA: Diagnosis not present

## 2017-07-01 DIAGNOSIS — F17210 Nicotine dependence, cigarettes, uncomplicated: Secondary | ICD-10-CM | POA: Diagnosis not present

## 2017-07-01 DIAGNOSIS — Z79899 Other long term (current) drug therapy: Secondary | ICD-10-CM | POA: Insufficient documentation

## 2017-07-01 DIAGNOSIS — T192XXA Foreign body in vulva and vagina, initial encounter: Secondary | ICD-10-CM | POA: Diagnosis present

## 2017-07-01 DIAGNOSIS — Y929 Unspecified place or not applicable: Secondary | ICD-10-CM | POA: Diagnosis not present

## 2017-07-01 DIAGNOSIS — Y999 Unspecified external cause status: Secondary | ICD-10-CM | POA: Insufficient documentation

## 2017-07-01 MED ORDER — DOXYCYCLINE HYCLATE 100 MG PO CAPS: 100.0000 mg | ORAL_CAPSULE | Freq: Two times a day (BID) | ORAL | 0 refills | Status: DC

## 2017-07-01 NOTE — ED Provider Notes (Signed)
[facility] EMERGENCY DEPARTMENT Provider Note   CSN: [id number] Arrival date & time: 07/01/17  1533     History   Chief Complaint No chief complaint on file.   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 Patient reports putting a baby wipe in her vagina over a week ago and has not been able to get it out. Patient denies fever but states she has had some diarrhea. No abdominal pain or other problems.   HPI  Past Medical History:  Diagnosis Date  . Hepatitis C   . Heroin abuse [facility])     Patient Active Problem List   Diagnosis Date Noted  . Compliance poor 11/15/2015  . Hepatitis C 10/28/2015  . History of RPR test + 10/28/2015  . Anemia affecting pregnancy in third trimester 10/28/2015  . Supervision of high risk pregnancy in third trimester 10/28/2015  . History of cesarean section 10/02/2015  . Polysubstance abuse (HCC) 10/02/2015  . Dichorionic diamniotic twin gestation 10/02/2015  . Anemia affecting pregnancy 10/02/2015    Past Surgical History:  Procedure Laterality Date  . CESAREAN SECTION  02/02/2012    OB History    Gravida Para Term Preterm AB Living   2 1 1  0 0 1   SAB TAB Ectopic Multiple Live Births   0 0 0 0 1       Home Medications    Prior to Admission medications   Medication Sig Start Date End Date Taking? Authorizing Provider  doxycycline (VIBRAMYCIN) 100 MG capsule Take 1 capsule (100 mg total) by mouth 2 (two) times daily. 07/01/17   Janne Napoleon, NP  ferrous sulfate (FERROUSUL) 325 (65 FE) MG tablet Take 1 tablet (325 mg total) by mouth 2 (two) times daily. 10/02/15   Wouk, Wilfred Curtis, MD  Magnesium Salicylate 325 MG TABS Take 325 mg by mouth daily.    [provider]  methadone (DOLOPHINE) 10 MG tablet Take 55 mg by mouth daily.    [provider]  Prenatal Multivit-Min-Fe-FA (PRENATAL VITAMINS) 0.8 MG tablet Take 1 tablet by mouth  daily. 10/02/15   Wouk, Wilfred Curtis, MD    Family History No family history on file.  Social History Social History   Tobacco Use  . Smoking status: Current Every Day Smoker    Packs/day: 1.00    Years: 5.00    Pack years: 5.00    Types: Cigarettes  . Smokeless tobacco: Never Used  Substance Use Topics  . Alcohol use: No  . Drug use: Yes    Types: Heroin    Comment: Heroin, Xanax     Allergies   Ibuprofen and Penicillins   Review of Systems Review of Systems  Constitutional: Positive for chills.  Gastrointestinal: Positive for diarrhea.  Genitourinary:       Vaginal foreign body  All other systems reviewed and are negative.    Physical Exam Updated Vital Signs BP 107/79 (BP Location: Right Arm)   Pulse (!) 56   Temp 98.4 F (36.9 C) (Oral)   Resp 16   Ht 5\' 5"  (1.651 m)   Wt 54.4 kg (120 lb)   SpO2 100%   BMI 19.97 kg/m   Physical Exam  Constitutional: She appears well-developed and well-nourished. No distress.  Eyes: EOM are normal.  Neck: Neck supple.  Cardiovascular: Normal rate.  Pulmonary/Chest: Effort normal.  Genitourinary:  Genitourinary Comments: External genitalia without lesions,  foreign body vaginal vault. Cervix without lesions.   Musculoskeletal: Normal range of motion.  Neurological: She is alert.  Skin: Skin is warm and dry.  Psychiatric: She has a normal mood and affect.  Nursing note and vitals reviewed.    ED Treatments / Results  Labs (all labs ordered are listed, but only abnormal results are displayed) Labs Reviewed - No data to display  Radiology No results found.  Procedures .Foreign Body Removal Date/Time: 07/01/2017 6:30 PM Performed by: Janne NapoleonNeese, Abdulraheem Pineo M, NP Authorized by: Janne NapoleonNeese, Illya Gienger M, NP  Consent: Verbal consent obtained. Consent given by: patient Patient understanding: patient states understanding of the procedure being performed Required items: required blood products, implants, devices, and special  equipment available Patient identity confirmed: provided demographic data Body area: vagina  Sedation: Patient sedated: no  Patient restrained: no Patient cooperative: yes Removal mechanism: forceps Complexity: simple 1 objects recovered. Objects recovered: paper Post-procedure assessment: foreign body removed Patient tolerance: Patient tolerated the procedure well with no immediate complications Comments: Foreign body removed without difficulty, malodorous.    (including critical care time)  Medications Ordered in ED Medications - No data to display   Initial Impression / Assessment and Plan / ED Course  I have reviewed the triage vital signs and the nursing notes. 27 y.o. female with foreign body in vagina that was removed without difficulty. Since the f/b by hx has been there for over a week I have started a course of antibiotics for the patient and discussed return precautions.   Final Clinical Impressions(s) / ED Diagnoses   Final diagnoses:  Foreign body in vagina, initial encounter    ED Discharge Orders        Ordered    doxycycline (VIBRAMYCIN) 100 MG capsule  2 times daily     07/01/17 1831       Kerrie Buffaloeese, Julena Barbour EttrickM, [place]NP 07/01/17 2021    Tilden Fossaees, Elizabeth, MD 07/03/17 [phone]

## 2017-07-01 NOTE — ED Notes (Signed)
D/c reviewed with patient and detective accompanying patient

## 2017-07-01 NOTE — ED Triage Notes (Signed)
Pt states she has baby wipe stuck in vagina since 3/5.

## 2017-07-01 NOTE — Discharge Instructions (Signed)
If you develop high fevers, severe diarrhea or other problems return [facility].

## 2019-05-18 ENCOUNTER — Emergency Department (HOSPITAL_BASED_OUTPATIENT_CLINIC_OR_DEPARTMENT_OTHER): Payer: Self-pay

## 2019-05-18 ENCOUNTER — Encounter (HOSPITAL_BASED_OUTPATIENT_CLINIC_OR_DEPARTMENT_OTHER): Payer: Self-pay | Admitting: Emergency Medicine

## 2019-05-18 ENCOUNTER — Other Ambulatory Visit: Payer: Self-pay

## 2019-05-18 ENCOUNTER — Inpatient Hospital Stay (HOSPITAL_BASED_OUTPATIENT_CLINIC_OR_DEPARTMENT_OTHER)
Admission: EM | Admit: 2019-05-18 | Discharge: 2019-06-30 | DRG: 853 | Disposition: A | Discharge status: Home / Self Care | Attending: Internal Medicine | Admitting: Internal Medicine

## 2019-05-18 DIAGNOSIS — I079 Rheumatic tricuspid valve disease, unspecified: Secondary | ICD-10-CM | POA: Diagnosis present

## 2019-05-18 DIAGNOSIS — F1910 Other psychoactive substance abuse, uncomplicated: Secondary | ICD-10-CM

## 2019-05-18 DIAGNOSIS — I2690 Septic pulmonary embolism without acute cor pulmonale: Secondary | ICD-10-CM | POA: Diagnosis present

## 2019-05-18 DIAGNOSIS — J188 Other pneumonia, unspecified organism: Secondary | ICD-10-CM | POA: Diagnosis present

## 2019-05-18 DIAGNOSIS — R6520 Severe sepsis without septic shock: Secondary | ICD-10-CM | POA: Diagnosis present

## 2019-05-18 DIAGNOSIS — R768 Other specified abnormal immunological findings in serum: Secondary | ICD-10-CM | POA: Diagnosis present

## 2019-05-18 DIAGNOSIS — Q2112 Patent foramen ovale: Secondary | ICD-10-CM

## 2019-05-18 DIAGNOSIS — K0401 Reversible pulpitis: Secondary | ICD-10-CM | POA: Diagnosis present

## 2019-05-18 DIAGNOSIS — A4102 Sepsis due to Methicillin resistant Staphylococcus aureus: Principal | ICD-10-CM | POA: Diagnosis present

## 2019-05-18 DIAGNOSIS — Z8619 Personal history of other infectious and parasitic diseases: Secondary | ICD-10-CM

## 2019-05-18 DIAGNOSIS — K761 Chronic passive congestion of liver: Secondary | ICD-10-CM | POA: Diagnosis present

## 2019-05-18 DIAGNOSIS — G4700 Insomnia, unspecified: Secondary | ICD-10-CM | POA: Diagnosis present

## 2019-05-18 DIAGNOSIS — A419 Sepsis, unspecified organism: Secondary | ICD-10-CM

## 2019-05-18 DIAGNOSIS — B9620 Unspecified Escherichia coli [E. coli] as the cause of diseases classified elsewhere: Secondary | ICD-10-CM | POA: Diagnosis present

## 2019-05-18 DIAGNOSIS — I313 Pericardial effusion (noninflammatory): Secondary | ICD-10-CM | POA: Diagnosis present

## 2019-05-18 DIAGNOSIS — F1120 Opioid dependence, uncomplicated: Secondary | ICD-10-CM | POA: Diagnosis present

## 2019-05-18 DIAGNOSIS — R0989 Other specified symptoms and signs involving the circulatory and respiratory systems: Secondary | ICD-10-CM

## 2019-05-18 DIAGNOSIS — B182 Chronic viral hepatitis C: Secondary | ICD-10-CM | POA: Diagnosis present

## 2019-05-18 DIAGNOSIS — I071 Rheumatic tricuspid insufficiency: Secondary | ICD-10-CM | POA: Diagnosis present

## 2019-05-18 DIAGNOSIS — K047 Periapical abscess without sinus: Secondary | ICD-10-CM | POA: Diagnosis present

## 2019-05-18 DIAGNOSIS — M009 Pyogenic arthritis, unspecified: Secondary | ICD-10-CM

## 2019-05-18 DIAGNOSIS — K083 Retained dental root: Secondary | ICD-10-CM | POA: Diagnosis present

## 2019-05-18 DIAGNOSIS — Z79899 Other long term (current) drug therapy: Secondary | ICD-10-CM

## 2019-05-18 DIAGNOSIS — B1920 Unspecified viral hepatitis C without hepatic coma: Secondary | ICD-10-CM | POA: Diagnosis present

## 2019-05-18 DIAGNOSIS — Q211 Atrial septal defect: Secondary | ICD-10-CM

## 2019-05-18 DIAGNOSIS — Z8614 Personal history of Methicillin resistant Staphylococcus aureus infection: Secondary | ICD-10-CM | POA: Diagnosis present

## 2019-05-18 DIAGNOSIS — R7982 Elevated C-reactive protein (CRP): Secondary | ICD-10-CM | POA: Diagnosis present

## 2019-05-18 DIAGNOSIS — D638 Anemia in other chronic diseases classified elsewhere: Secondary | ICD-10-CM | POA: Diagnosis present

## 2019-05-18 DIAGNOSIS — I76 Septic arterial embolism: Secondary | ICD-10-CM

## 2019-05-18 DIAGNOSIS — K0889 Other specified disorders of teeth and supporting structures: Secondary | ICD-10-CM

## 2019-05-18 DIAGNOSIS — M4627 Osteomyelitis of vertebra, lumbosacral region: Secondary | ICD-10-CM | POA: Diagnosis present

## 2019-05-18 DIAGNOSIS — K029 Dental caries, unspecified: Secondary | ICD-10-CM | POA: Diagnosis present

## 2019-05-18 DIAGNOSIS — Z20822 Contact with and (suspected) exposure to covid-19: Secondary | ICD-10-CM | POA: Diagnosis present

## 2019-05-18 DIAGNOSIS — M00032 Staphylococcal arthritis, left wrist: Secondary | ICD-10-CM | POA: Diagnosis present

## 2019-05-18 DIAGNOSIS — R252 Cramp and spasm: Secondary | ICD-10-CM | POA: Diagnosis present

## 2019-05-18 DIAGNOSIS — M0009 Staphylococcal polyarthritis: Secondary | ICD-10-CM | POA: Diagnosis present

## 2019-05-18 DIAGNOSIS — N12 Tubulo-interstitial nephritis, not specified as acute or chronic: Secondary | ICD-10-CM | POA: Diagnosis present

## 2019-05-18 DIAGNOSIS — F17210 Nicotine dependence, cigarettes, uncomplicated: Secondary | ICD-10-CM | POA: Diagnosis present

## 2019-05-18 DIAGNOSIS — R7881 Bacteremia: Secondary | ICD-10-CM

## 2019-05-18 DIAGNOSIS — F1990 Other psychoactive substance use, unspecified, uncomplicated: Secondary | ICD-10-CM | POA: Diagnosis present

## 2019-05-18 DIAGNOSIS — K5900 Constipation, unspecified: Secondary | ICD-10-CM | POA: Diagnosis not present

## 2019-05-18 DIAGNOSIS — K045 Chronic apical periodontitis: Secondary | ICD-10-CM | POA: Diagnosis present

## 2019-05-18 DIAGNOSIS — Z880 Allergy status to penicillin: Secondary | ICD-10-CM

## 2019-05-18 DIAGNOSIS — G589 Mononeuropathy, unspecified: Secondary | ICD-10-CM | POA: Diagnosis present

## 2019-05-18 DIAGNOSIS — Z87442 Personal history of urinary calculi: Secondary | ICD-10-CM

## 2019-05-18 DIAGNOSIS — M4647 Discitis, unspecified, lumbosacral region: Secondary | ICD-10-CM | POA: Diagnosis present

## 2019-05-18 DIAGNOSIS — W109XXA Fall (on) (from) unspecified stairs and steps, initial encounter: Secondary | ICD-10-CM | POA: Diagnosis present

## 2019-05-18 DIAGNOSIS — Z886 Allergy status to analgesic agent status: Secondary | ICD-10-CM

## 2019-05-18 DIAGNOSIS — M00011 Staphylococcal arthritis, right shoulder: Secondary | ICD-10-CM | POA: Diagnosis present

## 2019-05-18 DIAGNOSIS — R7989 Other specified abnormal findings of blood chemistry: Secondary | ICD-10-CM | POA: Diagnosis present

## 2019-05-18 DIAGNOSIS — M264 Malocclusion, unspecified: Secondary | ICD-10-CM | POA: Diagnosis present

## 2019-05-18 DIAGNOSIS — K036 Deposits [accretions] on teeth: Secondary | ICD-10-CM | POA: Diagnosis present

## 2019-05-18 DIAGNOSIS — I330 Acute and subacute infective endocarditis: Secondary | ICD-10-CM | POA: Diagnosis present

## 2019-05-18 HISTORY — DX: Calculus of kidney: N20.0

## 2019-05-18 LAB — BASIC METABOLIC PANEL
Anion gap: 9 (ref 5–15)
BUN: 14 mg/dL (ref 6–20)
CO2: 23 mmol/L (ref 22–32)
Calcium: 8.2 mg/dL — ABNORMAL LOW (ref 8.9–10.3)
Chloride: 96 mmol/L — ABNORMAL LOW (ref 98–111)
Creatinine, Ser: 0.9 mg/dL (ref 0.44–1.00)
GFR calc Af Amer: >60 mL/min (ref >60)
GFR calc non Af Amer: >60 mL/min (ref >60)
Glucose, Bld: 125 mg/dL — ABNORMAL HIGH (ref 70–99)
Potassium: 4.8 mmol/L (ref 3.5–5.1)
Sodium: 128 mmol/L — ABNORMAL LOW (ref 135–145)

## 2019-05-18 LAB — PREGNANCY, URINE: Preg Test, Ur: NEGATIVE

## 2019-05-18 LAB — URINALYSIS, ROUTINE W REFLEX MICROSCOPIC
Glucose, UA: NEGATIVE mg/dL
Hgb urine dipstick: NEGATIVE
Ketones, ur: NEGATIVE mg/dL
Nitrite: POSITIVE — AB
Protein, ur: 30 mg/dL — AB
Specific Gravity, Urine: 1.02 (ref 1.005–1.030)
pH: 5.5 (ref 5.0–8.0)

## 2019-05-18 LAB — URINALYSIS, MICROSCOPIC (REFLEX): RBC / HPF: NONE SEEN RBC/hpf (ref 0–5)

## 2019-05-18 LAB — LACTIC ACID, PLASMA: Lactic Acid, Venous: 2.4 mmol/L (ref 0.5–1.9)

## 2019-05-18 MED ORDER — SODIUM CHLORIDE 0.9 % IV BOLUS
500.0000 mL | Freq: Once | INTRAVENOUS | Status: AC
  Administered 2019-05-18: 500 mL via INTRAVENOUS

## 2019-05-18 MED ORDER — ACETAMINOPHEN 325 MG PO TABS
650.0000 mg | ORAL_TABLET | Freq: Once | ORAL | Status: DC
  Filled 2019-05-18: qty 2

## 2019-05-18 MED ORDER — LEVOFLOXACIN IN D5W 750 MG/150ML IV SOLN
750.0000 mg | Freq: Once | INTRAVENOUS | Status: AC
  Administered 2019-05-18: 750 mg via INTRAVENOUS
  Filled 2019-05-18: qty 150

## 2019-05-18 NOTE — ED Notes (Addendum)
Pt also reporting fall down steps yesterday with continued arm pain. States she's been around someone with fevers yesterday. Reports tested for covid three days ago and received negative result.  Prompted to provide urine sample and change into gown. EDP at bedside.

## 2019-05-18 NOTE — ED Provider Notes (Signed)
[facility] EMERGENCY DEPARTMENT Provider Note   CSN: [id number] Arrival date & time: 05/18/19  2213     History Chief Complaint  Patient presents with  . Fever  . Back Pain    Christy Pearson is a 29 y.o. female.  Patient reports right flank pain for over one week. She thinks she has a kidney stone. History of same. No reported hematuria. Patient is febrile and tachycardic. She states she fell down some stairs yesterday, but denies any injuries to me. She reported to the nurse that she was having arm pain from the fall.   The history is provided by the patient. No language interpreter was used.  Fever Back Pain Associated symptoms: fever   Flank Pain The current episode started more than 1 week ago. The problem occurs constantly. The problem has been gradually worsening.       Past Medical History:  Diagnosis Date  . Hepatitis C   . Heroin abuse [facility])     Patient Active Problem List   Diagnosis Date Noted  . Compliance poor 11/15/2015  . Hepatitis C 10/28/2015  . History of RPR test + 10/28/2015  . Anemia affecting pregnancy in third trimester 10/28/2015  . Supervision of high risk pregnancy in third trimester 10/28/2015  . History of cesarean section 10/02/2015  . Polysubstance abuse (HCC) 10/02/2015  . Dichorionic diamniotic twin gestation 10/02/2015  . Anemia affecting pregnancy 10/02/2015    Past Surgical History:  Procedure Laterality Date  . CESAREAN SECTION  02/02/2012     OB History    Gravida  2   Para  1   Term  1   Preterm  0   AB  0   Living  1     SAB  0   TAB  0   Ectopic  0   Multiple  0   Live Births  1           No family history on file.  Social History   Tobacco Use  . Smoking status: Current Every Day Smoker    Packs/day: 1.00    Years: 5.00    Pack years: 5.00    Types: Cigarettes  . Smokeless tobacco: Never Used  Substance Use Topics  . Alcohol use: No  . Drug use: Yes    Types: Heroin   Comment: Heroin, Xanax    Home Medications Prior to Admission medications   Medication Sig Start Date End Date Taking? Authorizing Provider  doxycycline (VIBRAMYCIN) 100 MG capsule Take 1 capsule (100 mg total) by mouth 2 (two) times daily. 07/01/17   Janne Napoleon, NP  ferrous sulfate (FERROUSUL) 325 (65 FE) MG tablet Take 1 tablet (325 mg total) by mouth 2 (two) times daily. 10/02/15   Wouk, Wilfred Curtis, MD  Magnesium Salicylate 325 MG TABS Take 325 mg by mouth daily.    [provider]  methadone (DOLOPHINE) 10 MG tablet Take 55 mg by mouth daily.    [provider]  Prenatal Multivit-Min-Fe-FA (PRENATAL VITAMINS) 0.8 MG tablet Take 1 tablet by mouth daily. 10/02/15   Wouk, Wilfred Curtis, MD    Allergies    Ibuprofen and Penicillins  Review of Systems   Review of Systems  Constitutional: Positive for fever.  Genitourinary: Positive for flank pain.  Musculoskeletal: Positive for back pain.  All other systems reviewed and are negative.   Physical Exam Updated Vital Signs BP 119/65 (BP Location: Right Arm)   Pulse (!) 140  Temp (!) 103.7 F (39.8 C) (Oral)   Resp 20   Wt 57.7 kg   LMP 04/27/2019 (Within Days)   SpO2 100%   BMI 21.17 kg/m   Physical Exam Vitals and nursing note reviewed.  Constitutional:      Appearance: She is not toxic-appearing.  HENT:     Head: Normocephalic.  Eyes:     Conjunctiva/sclera: Conjunctivae normal.  Cardiovascular:     Rate and Rhythm: Regular rhythm. Tachycardia present.  Pulmonary:     Effort: Pulmonary effort is normal.     Breath sounds: Normal breath sounds.  Abdominal:     Palpations: Abdomen is soft.     Tenderness: There is right CVA tenderness.  Musculoskeletal:        General: No swelling, tenderness or deformity. Normal range of motion.     Comments: No obvious injury to extremities.  Skin:    General: Skin is warm and dry.     Findings: No rash.  Neurological:     Mental Status: She is alert and  oriented to person, place, and time.  Psychiatric:        Mood and Affect: Mood normal.     ED Results / Procedures / Treatments   Labs (all labs ordered are listed, but only abnormal results are displayed) Labs Reviewed - No data to display  EKG None  Radiology No results found.  Procedures Procedures (including critical care time)  Medications Ordered in ED Medications - No data to display  ED Course  I have reviewed the triage vital signs and the nursing notes.  Pertinent labs & imaging results that were available during my care of the patient were reviewed by me and considered in my medical decision making (see chart for details).  Plan: IV fluids, labs, renal stone study. Patient signed out to A. Palumbo at shift change awaiting results.  MDM Rules/Calculators/A&P                       Final Clinical Impression(s) / ED Diagnoses Final diagnoses:  None    Rx / DC Orders ED Discharge Orders    None       Etta Quill, NP 05/18/19 6578    Veryl Speak, MD 05/19/19 [phone]

## 2019-05-18 NOTE — ED Triage Notes (Signed)
Pt reports back pain for over one week. States concerned for kidney stones, hx of same.

## 2019-05-18 NOTE — ED Notes (Signed)
Lactic acid 2.4; Dr Nicanor Alcon aware.

## 2019-05-18 NOTE — ED Notes (Signed)
PT states she is allergic to tylenol. Added to allergy list.

## 2019-05-19 ENCOUNTER — Telehealth: Payer: Self-pay | Admitting: Cardiovascular Disease

## 2019-05-19 ENCOUNTER — Encounter (HOSPITAL_BASED_OUTPATIENT_CLINIC_OR_DEPARTMENT_OTHER): Payer: Self-pay | Admitting: Internal Medicine

## 2019-05-19 ENCOUNTER — Inpatient Hospital Stay (HOSPITAL_COMMUNITY)

## 2019-05-19 ENCOUNTER — Inpatient Hospital Stay (HOSPITAL_COMMUNITY): Payer: Self-pay

## 2019-05-19 ENCOUNTER — Other Ambulatory Visit (HOSPITAL_COMMUNITY)

## 2019-05-19 ENCOUNTER — Emergency Department (HOSPITAL_BASED_OUTPATIENT_CLINIC_OR_DEPARTMENT_OTHER): Payer: Self-pay

## 2019-05-19 DIAGNOSIS — D649 Anemia, unspecified: Secondary | ICD-10-CM

## 2019-05-19 DIAGNOSIS — I361 Nonrheumatic tricuspid (valve) insufficiency: Secondary | ICD-10-CM

## 2019-05-19 DIAGNOSIS — I071 Rheumatic tricuspid insufficiency: Secondary | ICD-10-CM | POA: Diagnosis not present

## 2019-05-19 DIAGNOSIS — M546 Pain in thoracic spine: Secondary | ICD-10-CM

## 2019-05-19 DIAGNOSIS — M545 Low back pain: Secondary | ICD-10-CM

## 2019-05-19 DIAGNOSIS — Z880 Allergy status to penicillin: Secondary | ICD-10-CM

## 2019-05-19 DIAGNOSIS — I76 Septic arterial embolism: Secondary | ICD-10-CM | POA: Diagnosis not present

## 2019-05-19 DIAGNOSIS — I330 Acute and subacute infective endocarditis: Secondary | ICD-10-CM | POA: Diagnosis not present

## 2019-05-19 DIAGNOSIS — R011 Cardiac murmur, unspecified: Secondary | ICD-10-CM

## 2019-05-19 DIAGNOSIS — F1910 Other psychoactive substance abuse, uncomplicated: Secondary | ICD-10-CM | POA: Diagnosis not present

## 2019-05-19 DIAGNOSIS — R0989 Other specified symptoms and signs involving the circulatory and respiratory systems: Secondary | ICD-10-CM

## 2019-05-19 DIAGNOSIS — F1990 Other psychoactive substance use, unspecified, uncomplicated: Secondary | ICD-10-CM | POA: Diagnosis present

## 2019-05-19 DIAGNOSIS — I2690 Septic pulmonary embolism without acute cor pulmonale: Secondary | ICD-10-CM | POA: Diagnosis present

## 2019-05-19 DIAGNOSIS — Z8619 Personal history of other infectious and parasitic diseases: Secondary | ICD-10-CM

## 2019-05-19 DIAGNOSIS — Z888 Allergy status to other drugs, medicaments and biological substances status: Secondary | ICD-10-CM

## 2019-05-19 DIAGNOSIS — A419 Sepsis, unspecified organism: Secondary | ICD-10-CM | POA: Diagnosis not present

## 2019-05-19 DIAGNOSIS — R7401 Elevation of levels of liver transaminase levels: Secondary | ICD-10-CM

## 2019-05-19 DIAGNOSIS — F17210 Nicotine dependence, cigarettes, uncomplicated: Secondary | ICD-10-CM

## 2019-05-19 DIAGNOSIS — N12 Tubulo-interstitial nephritis, not specified as acute or chronic: Secondary | ICD-10-CM | POA: Diagnosis present

## 2019-05-19 DIAGNOSIS — I340 Nonrheumatic mitral (valve) insufficiency: Secondary | ICD-10-CM

## 2019-05-19 HISTORY — DX: Sepsis, unspecified organism: A41.9

## 2019-05-19 LAB — RETICULOCYTES
Immature Retic Fract: 5.7 % (ref 2.3–15.9)
RBC.: 2.37 MIL/uL — ABNORMAL LOW (ref 3.87–5.11)
Retic Count, Absolute: 24.2 10*3/uL (ref 19.0–186.0)
Retic Ct Pct: 1 % (ref 0.4–3.1)

## 2019-05-19 LAB — BLOOD CULTURE ID PANEL (REFLEXED)

## 2019-05-19 LAB — COMPREHENSIVE METABOLIC PANEL
ALT: 69 U/L — ABNORMAL HIGH (ref 0–44)
AST: 149 U/L — ABNORMAL HIGH (ref 15–41)
Albumin: 1.9 g/dL — ABNORMAL LOW (ref 3.5–5.0)
Alkaline Phosphatase: 243 U/L — ABNORMAL HIGH (ref 38–126)
Anion gap: 8 (ref 5–15)
BUN: 13 mg/dL (ref 6–20)
CO2: 22 mmol/L (ref 22–32)
Calcium: 8 mg/dL — ABNORMAL LOW (ref 8.9–10.3)
Chloride: 97 mmol/L — ABNORMAL LOW (ref 98–111)
Creatinine, Ser: 0.9 mg/dL (ref 0.44–1.00)
GFR calc Af Amer: >60 mL/min (ref >60)
GFR calc non Af Amer: >60 mL/min (ref >60)
Glucose, Bld: 125 mg/dL — ABNORMAL HIGH (ref 70–99)
Potassium: 4.7 mmol/L (ref 3.5–5.1)
Sodium: 127 mmol/L — ABNORMAL LOW (ref 135–145)
Total Bilirubin: 1.3 mg/dL — ABNORMAL HIGH (ref 0.3–1.2)
Total Protein: 6.3 g/dL — ABNORMAL LOW (ref 6.5–8.1)

## 2019-05-19 LAB — RESPIRATORY PANEL BY PCR

## 2019-05-19 LAB — CBC WITH DIFFERENTIAL/PLATELET
Abs Immature Granulocytes: 0.17 10*3/uL — ABNORMAL HIGH (ref 0.00–0.07)
Basophils Absolute: 0 10*3/uL (ref 0.0–0.1)
Basophils Relative: 0 %
Eosinophils Absolute: 0 10*3/uL (ref 0.0–0.5)
Eosinophils Relative: 0 %
HCT: 22.3 % — ABNORMAL LOW (ref 36.0–46.0)
Hemoglobin: 7 g/dL — ABNORMAL LOW (ref 12.0–15.0)
Immature Granulocytes: 2 %
Lymphocytes Relative: 7 %
Lymphs Abs: 0.8 10*3/uL (ref 0.7–4.0)
MCH: 23.2 pg — ABNORMAL LOW (ref 26.0–34.0)
MCHC: 31.4 g/dL (ref 30.0–36.0)
MCV: 73.8 fL — ABNORMAL LOW (ref 80.0–100.0)
Monocytes Absolute: 0.9 10*3/uL (ref 0.1–1.0)
Monocytes Relative: 8 %
Neutro Abs: 8.5 10*3/uL — ABNORMAL HIGH (ref 1.7–7.7)
Neutrophils Relative %: 83 %
Platelets: 263 10*3/uL (ref 150–400)
RBC: 3.02 MIL/uL — ABNORMAL LOW (ref 3.87–5.11)
RDW: 17.2 % — ABNORMAL HIGH (ref 11.5–15.5)
Smear Review: NORMAL
WBC Morphology: INCREASED
WBC: 10.3 10*3/uL (ref 4.0–10.5)
nRBC: 0 % (ref 0.0–0.2)

## 2019-05-19 LAB — CREATININE, SERUM
Creatinine, Ser: 0.87 mg/dL (ref 0.44–1.00)
GFR calc Af Amer: >60 mL/min (ref >60)
GFR calc non Af Amer: >60 mL/min (ref >60)

## 2019-05-19 LAB — ECHOCARDIOGRAM COMPLETE
Height: 67 in
Weight: 1992.96 oz

## 2019-05-19 LAB — HIV ANTIBODY (ROUTINE TESTING W REFLEX): HIV Screen 4th Generation wRfx: NONREACTIVE

## 2019-05-19 LAB — SARS CORONAVIRUS 2 (TAT 6-24 HRS): SARS Coronavirus 2: NEGATIVE

## 2019-05-19 LAB — PROTIME-INR
INR: 1.3 — ABNORMAL HIGH (ref 0.8–1.2)
Prothrombin Time: 15.8 seconds — ABNORMAL HIGH (ref 11.4–15.2)

## 2019-05-19 LAB — FOLATE: Folate: 9.2 ng/mL (ref >5.9)

## 2019-05-19 LAB — RAPID URINE DRUG SCREEN, HOSP PERFORMED
Amphetamines: POSITIVE — AB
Barbiturates: NOT DETECTED
Benzodiazepines: NOT DETECTED
Cocaine: NOT DETECTED
Opiates: POSITIVE — AB
Tetrahydrocannabinol: NOT DETECTED

## 2019-05-19 LAB — LACTIC ACID, PLASMA: Lactic Acid, Venous: 1.7 mmol/L (ref 0.5–1.9)

## 2019-05-19 LAB — ABO/RH: ABO/RH(D): A POS

## 2019-05-19 LAB — VITAMIN B12: Vitamin B-12: 1298 pg/mL — ABNORMAL HIGH (ref 180–914)

## 2019-05-19 LAB — PREPARE RBC (CROSSMATCH)

## 2019-05-19 LAB — PROCALCITONIN
Procalcitonin: 6.14 ng/mL
Procalcitonin: 5.38 ng/mL

## 2019-05-19 LAB — FERRITIN: Ferritin: 142 ng/mL (ref 11–307)

## 2019-05-19 LAB — IRON AND TIBC
Iron: 37 ug/dL (ref 28–170)
Saturation Ratios: 18 % (ref 10.4–31.8)
TIBC: 203 ug/dL — ABNORMAL LOW (ref 250–450)
UIBC: 166 ug/dL

## 2019-05-19 LAB — HEMOGLOBIN AND HEMATOCRIT, BLOOD
HCT: 24.4 % — ABNORMAL LOW (ref 36.0–46.0)
Hemoglobin: 7.8 g/dL — ABNORMAL LOW (ref 12.0–15.0)

## 2019-05-19 LAB — SARS CORONAVIRUS 2 AG (30 MIN TAT): SARS Coronavirus 2 Ag: NEGATIVE

## 2019-05-19 LAB — APTT: aPTT: 39 seconds — ABNORMAL HIGH (ref 24–36)

## 2019-05-19 LAB — C-REACTIVE PROTEIN: CRP: 14.6 mg/dL — ABNORMAL HIGH (ref <1.0)

## 2019-05-19 LAB — TSH: TSH: 2.414 u[IU]/mL (ref 0.350–4.500)

## 2019-05-19 IMAGING — US US ABDOMEN LIMITED
1 series · 14 of 25 positions shown · non-contrast
Comparison: CT [DATE]

CLINICAL DATA: Elevated LFTs

EXAM:
ULTRASOUND ABDOMEN LIMITED RIGHT UPPER QUADRANT

[Series 1: us abdomen limited · 14 of 47 slices shown]
[im 1/47]
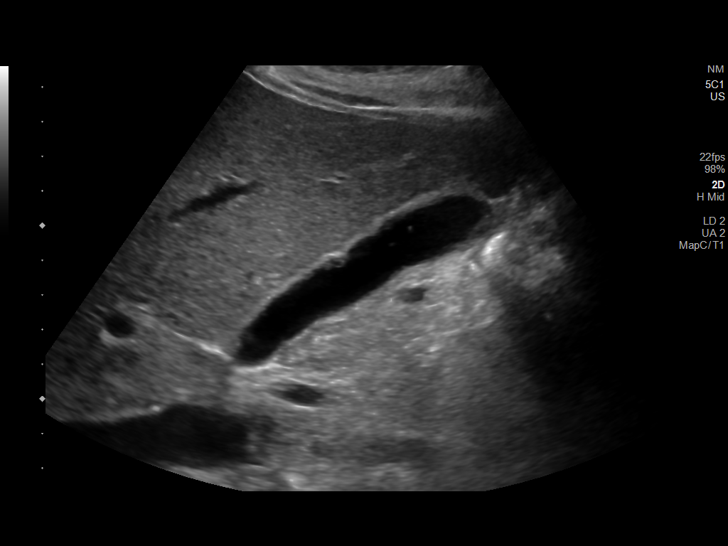
[im 4/47]
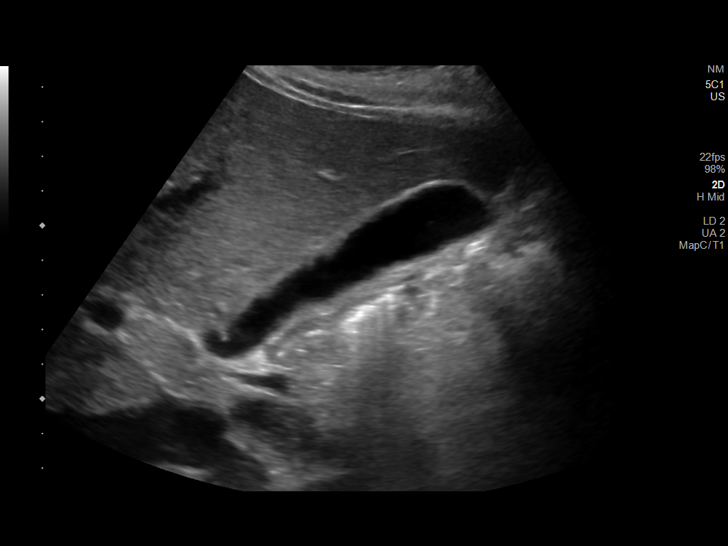
[im 8/47]
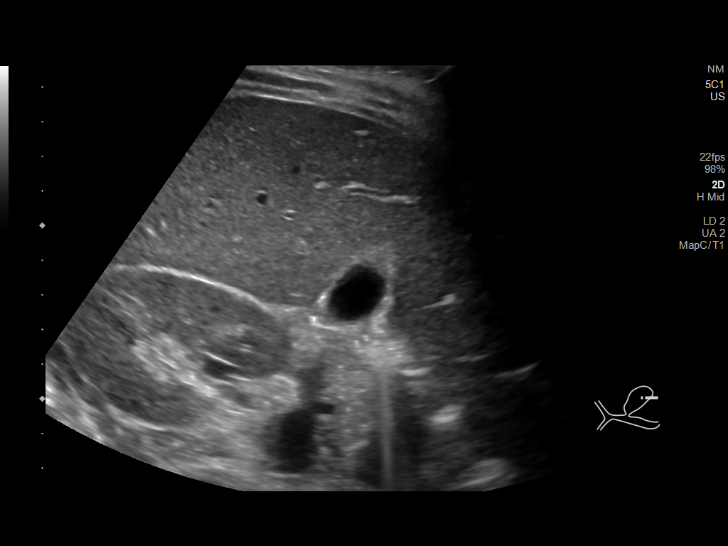
[im 12/47]
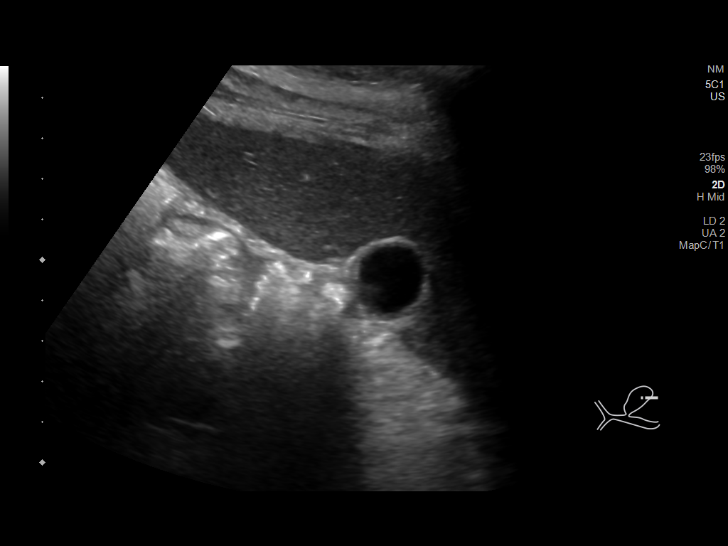
[im 16/47]
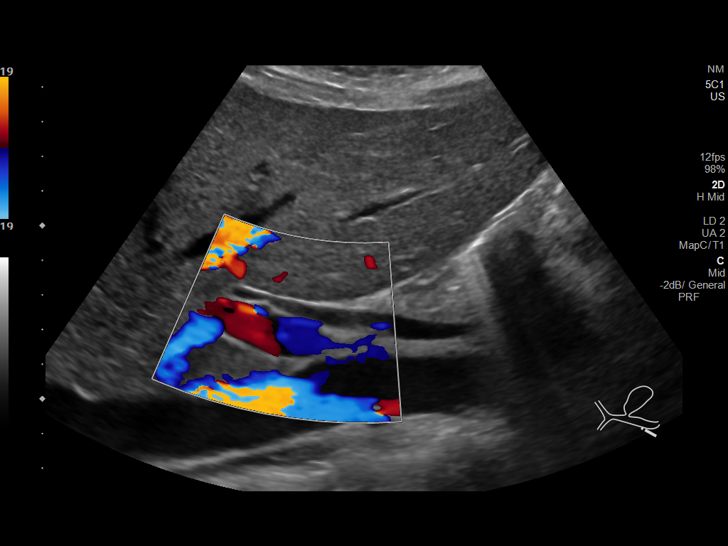
[im 18/47]
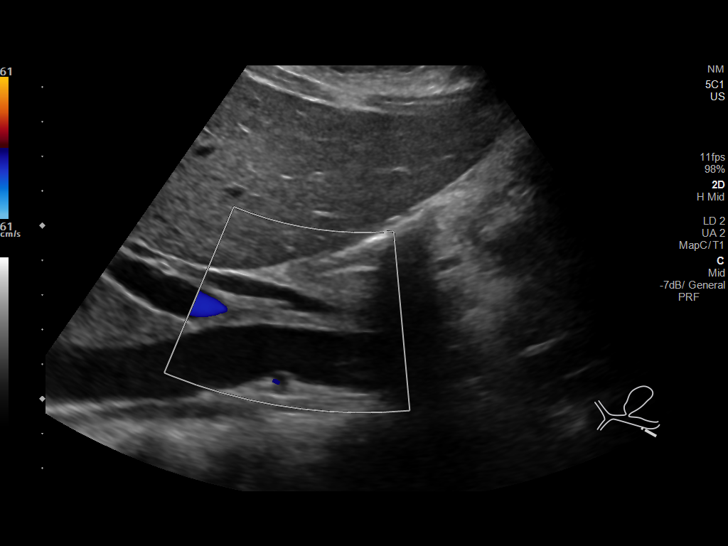
[im 22/47]
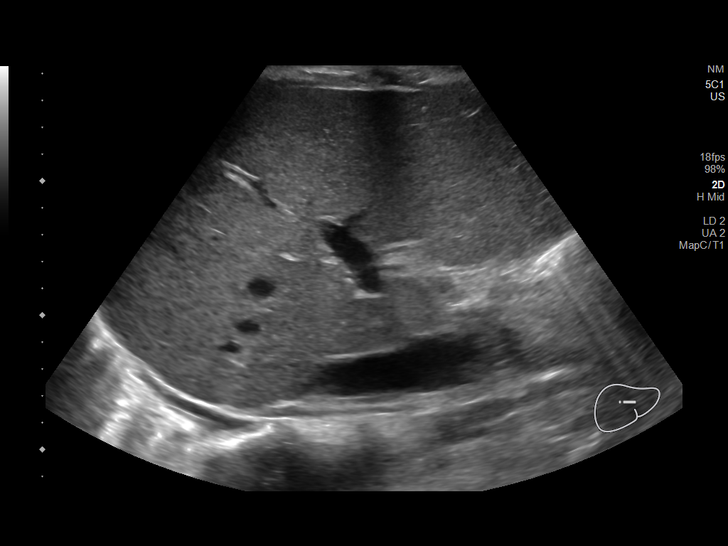
[im 25/47]
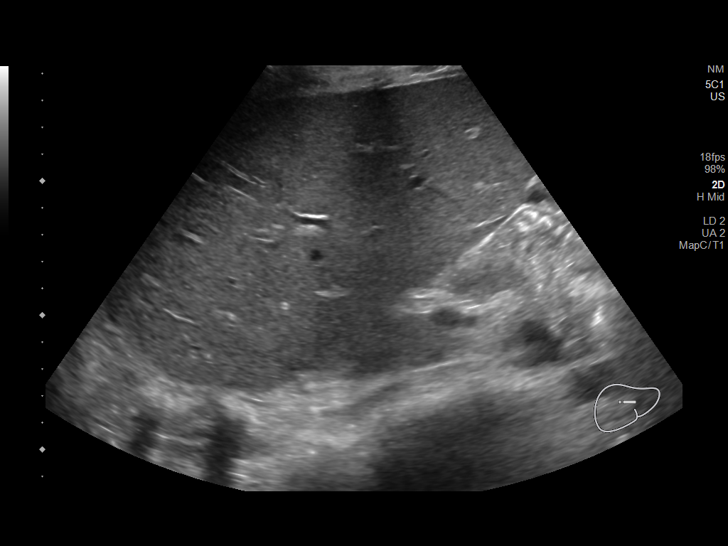
[im 29/47]
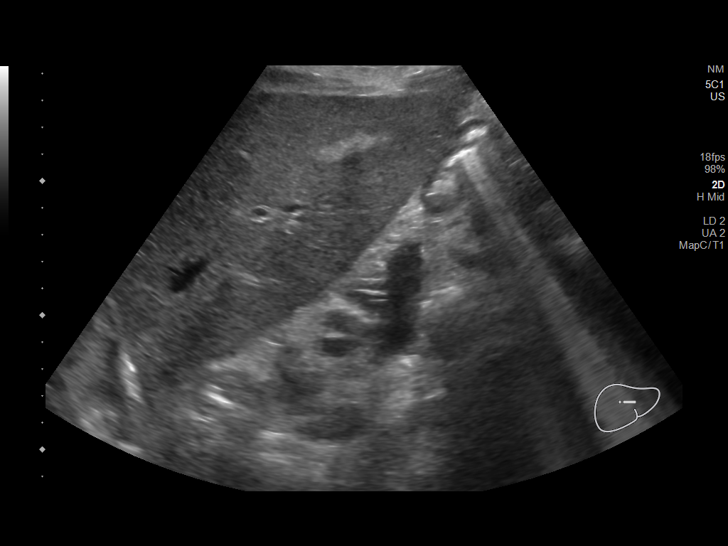
[im 31/47]
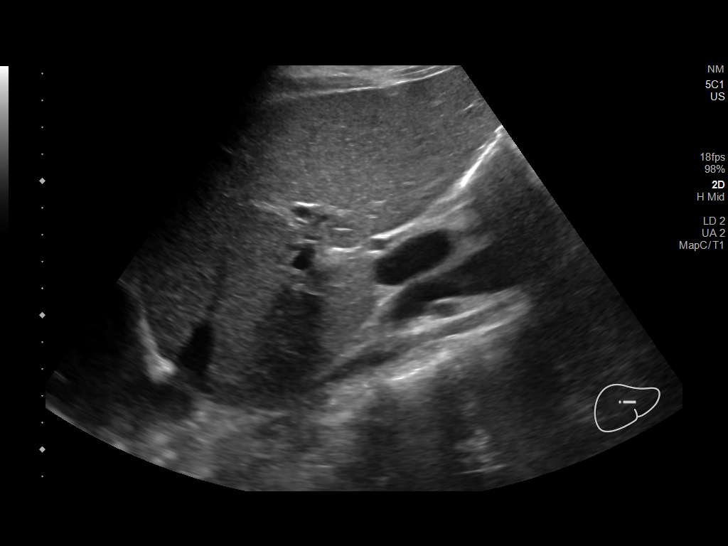
[im 35/47]
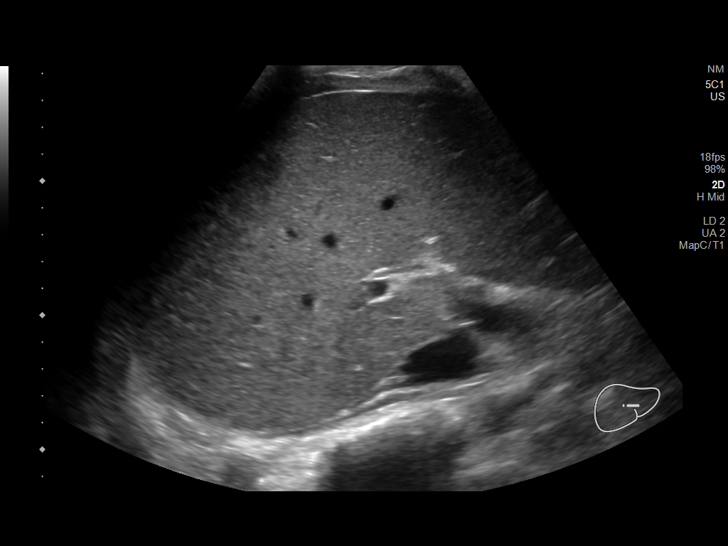
[im 39/47]
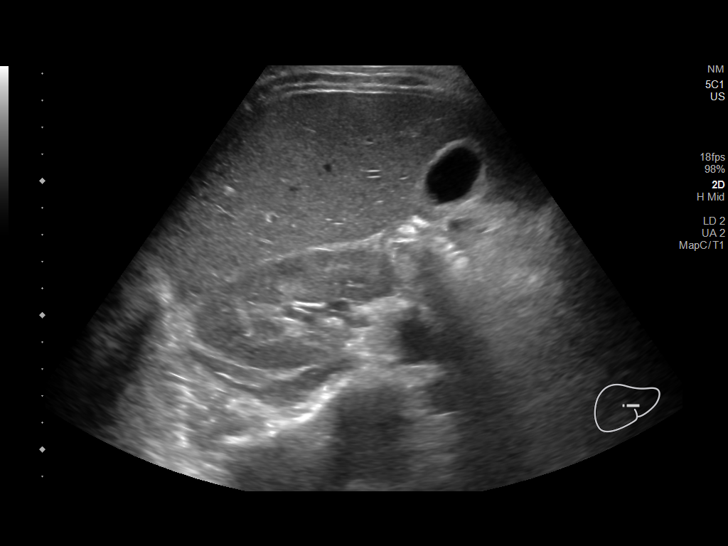
[im 43/47]
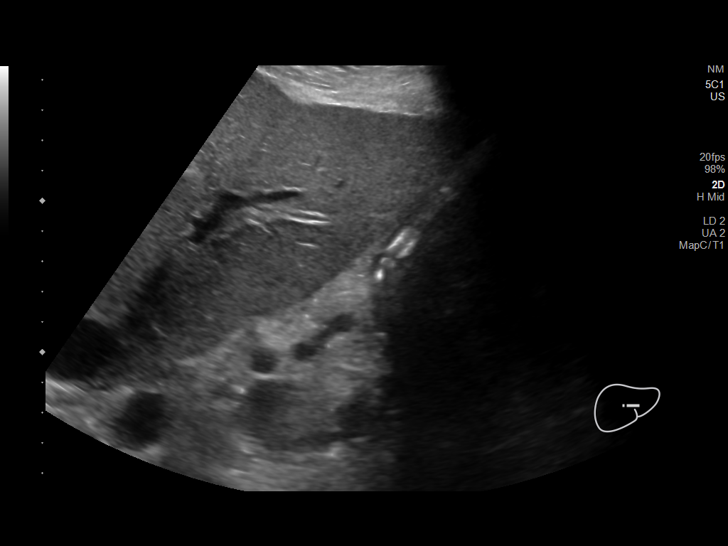
[im 47/47]
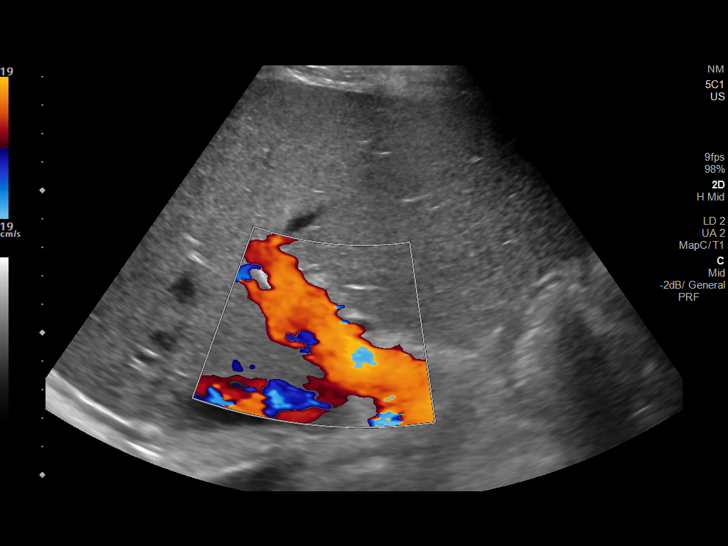

[14 of 25 positions shown; findings below may reference images not displayed]

FINDINGS: Gallbladder:

No shadowing stones. Wall thickness upper normal. Negative
sonographic Murphy.

Common bile duct:

Diameter: 3.1 mm

Liver:

No focal lesion identified. Within normal limits in parenchymal
echogenicity. Portal vein is patent on color Doppler imaging with
normal direction of blood flow towards the liver.

Other: None.
IMPRESSION: 1. Negative for gallstones or acute gallbladder disease.
2. Ultrasound appearance of the liver is within normal limits.

## 2019-05-19 IMAGING — DX DG CHEST 1V PORT
1 series · 1 of 1 positions shown · non-contrast
Comparison: [DATE]

CLINICAL DATA: Sepsis, pain

EXAM:
PORTABLE CHEST 1 VIEW

[chest ap]
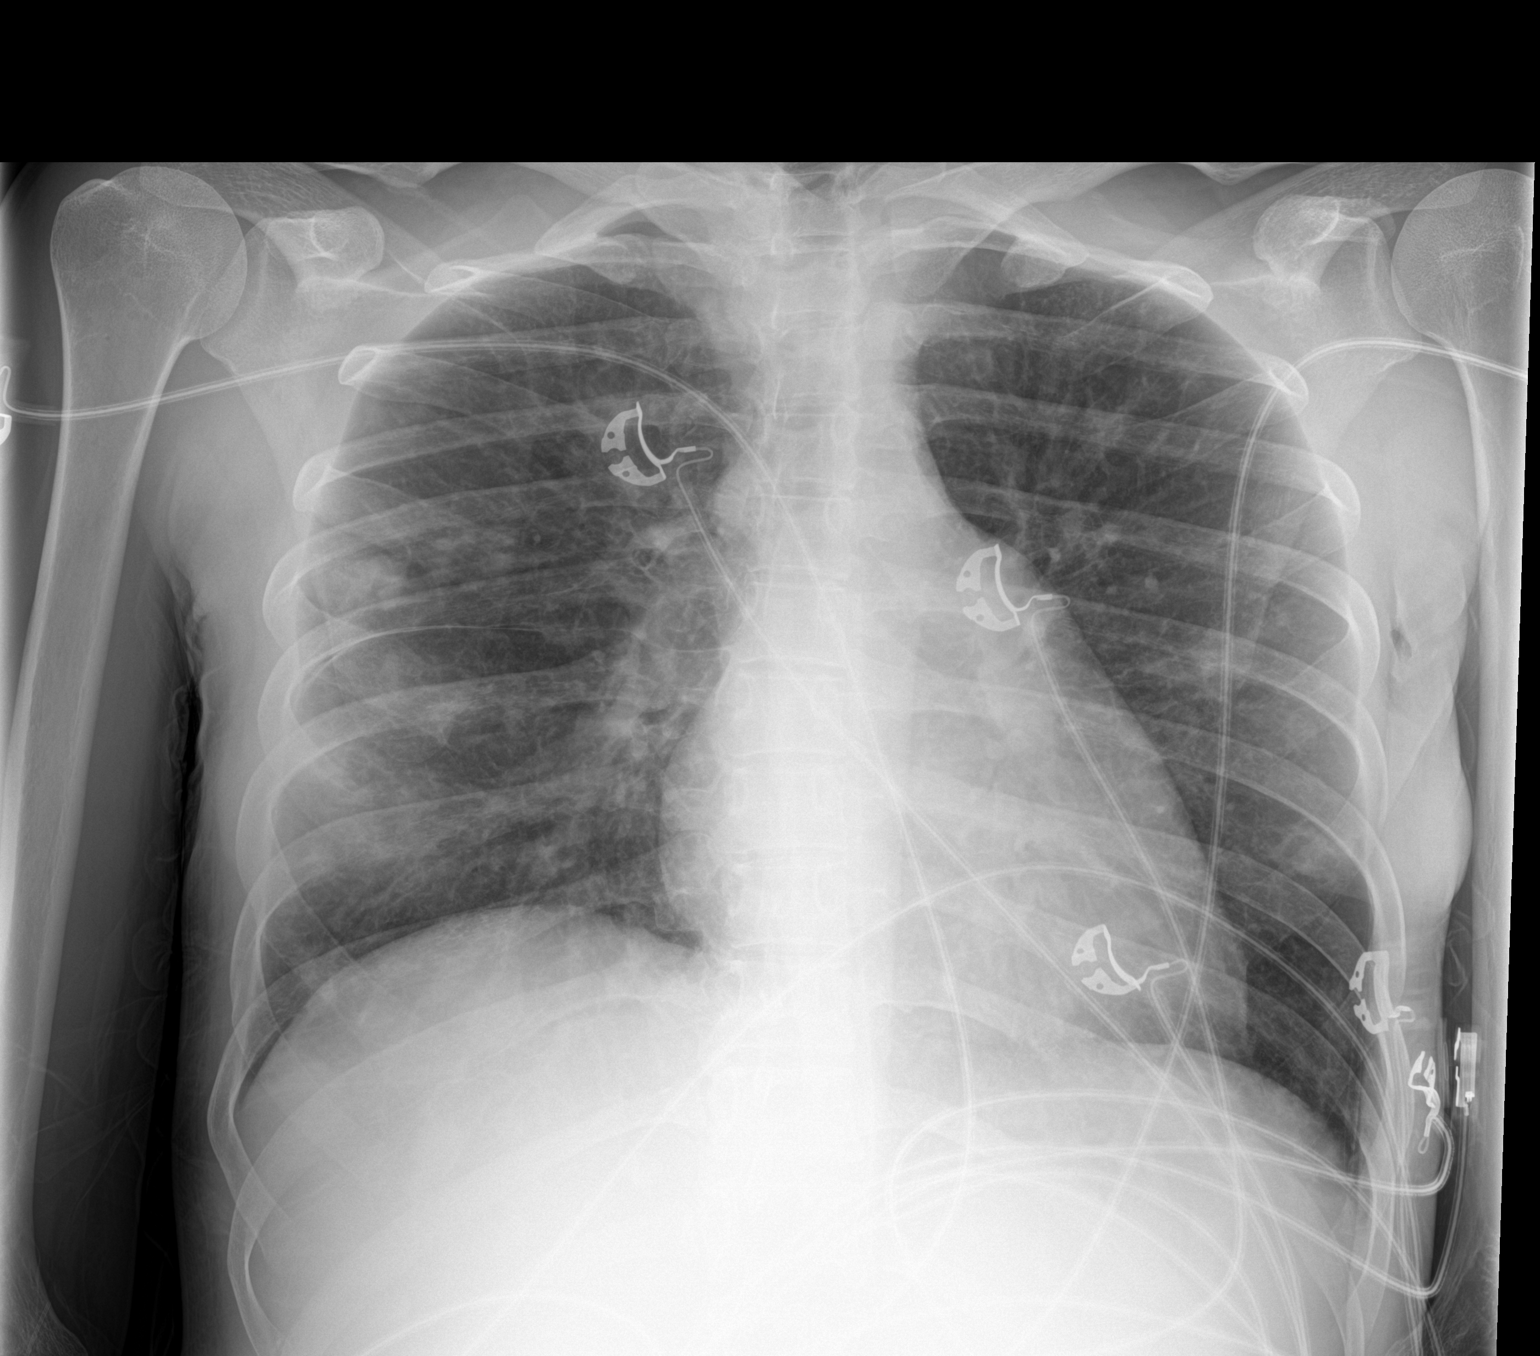

[1 of 1 positions shown; findings below may reference images not displayed]

FINDINGS: Patchy airspace opacities noted in both lungs, right greater than
left with somewhat peripheral predilection. No effusions. Heart is
normal size. No acute bony abnormality.
IMPRESSION: Patchy bilateral airspace disease concerning for pneumonia.

## 2019-05-19 IMAGING — CT CT RENAL STONE PROTOCOL
2 of 4 series · 17 of 46 positions shown, 19 images · non-contrast
Comparison: [DATE]

CLINICAL DATA: Back pain.  History of hepatitis-C.  Kidney stones.

EXAM:
CT ABDOMEN AND PELVIS WITHOUT CONTRAST
TECHNIQUE: Multidetector CT imaging of the abdomen and pelvis was performed
following the standard protocol without IV contrast.

[Series 2: axial st · axial · 0.72mm/px · z∈[-578,-178]mm · 14 of 88 slices shown, 16 images]
[im 4/88  soft-tissue]
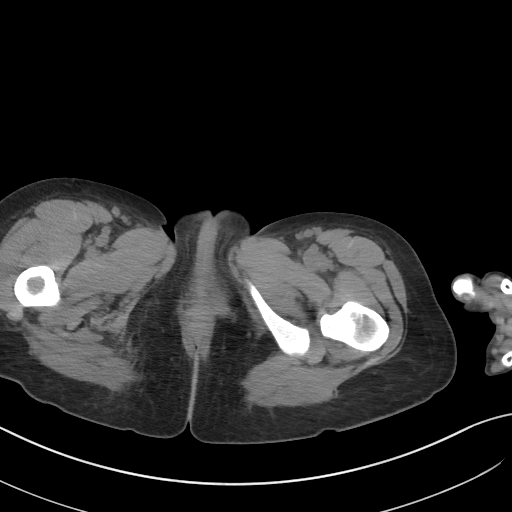
[im 4/88  bone]
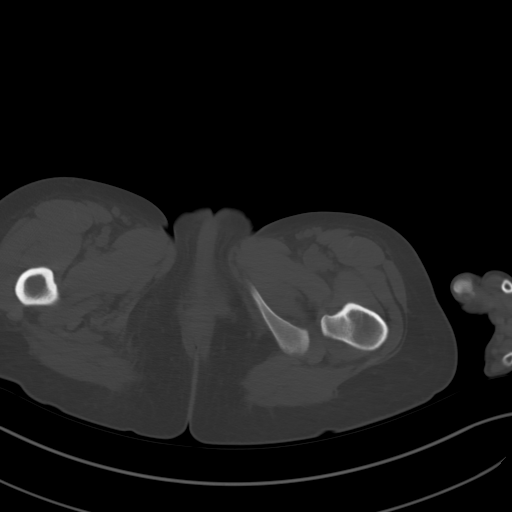
[im 11/88  soft-tissue]
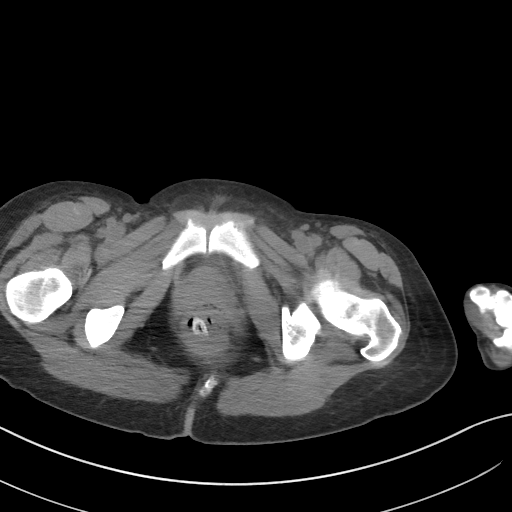
[im 19/88  soft-tissue]
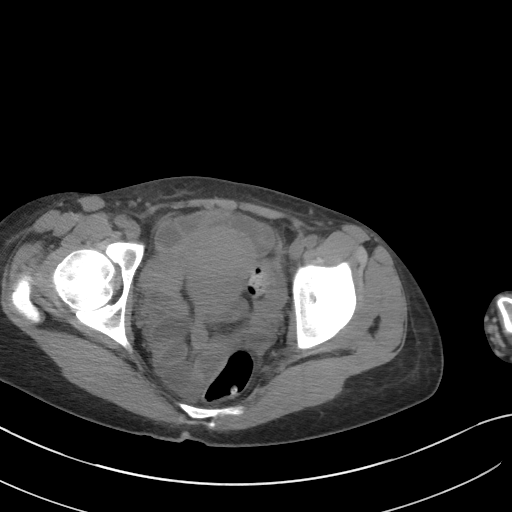
[im 22/88  soft-tissue]
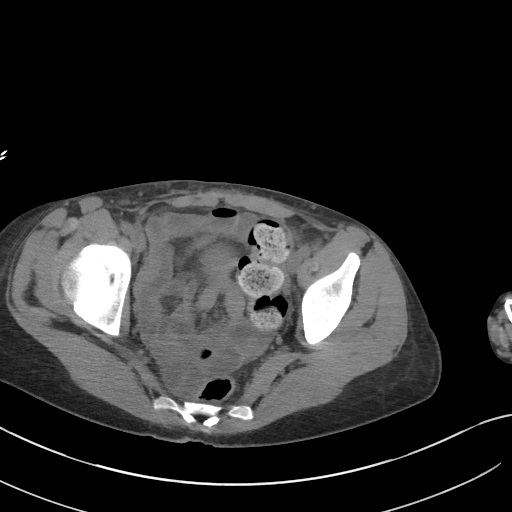
[im 30/88  soft-tissue]
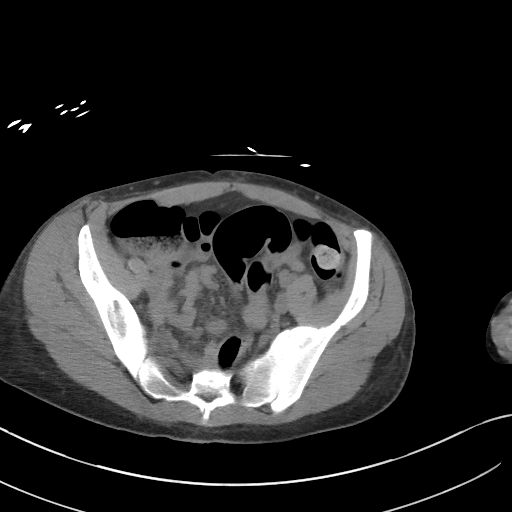
[im 37/88  soft-tissue]
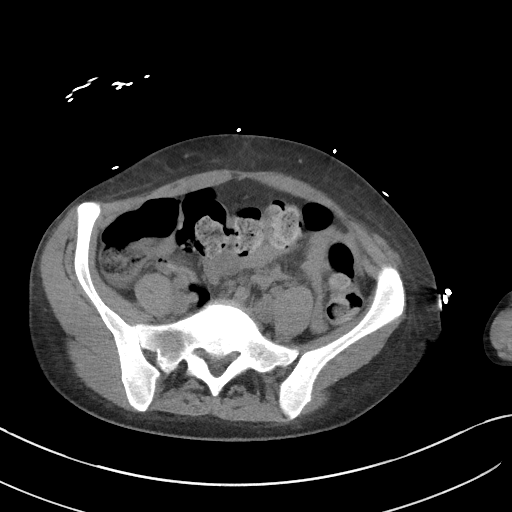
[im 40/88  soft-tissue]
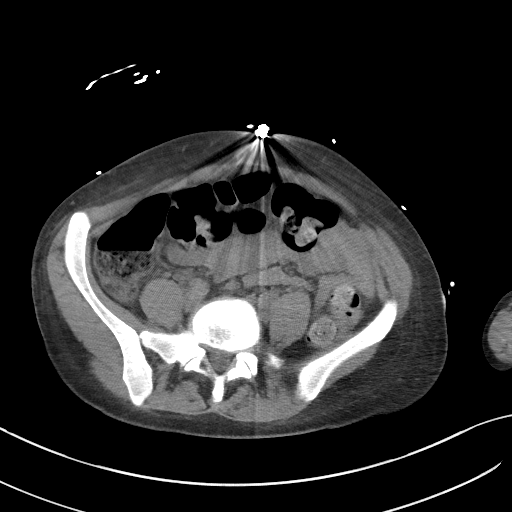
[im 48/88  soft-tissue]
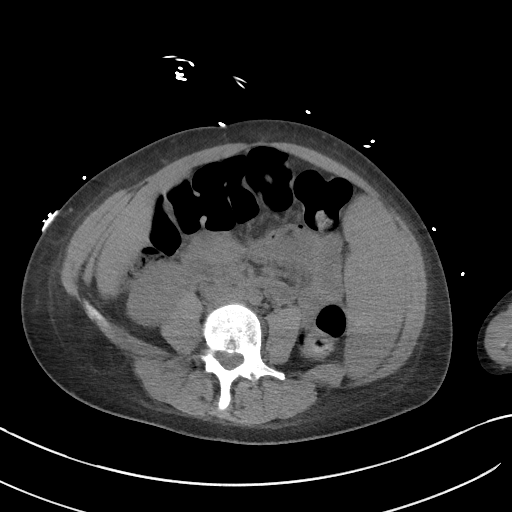
[im 51/88  soft-tissue]
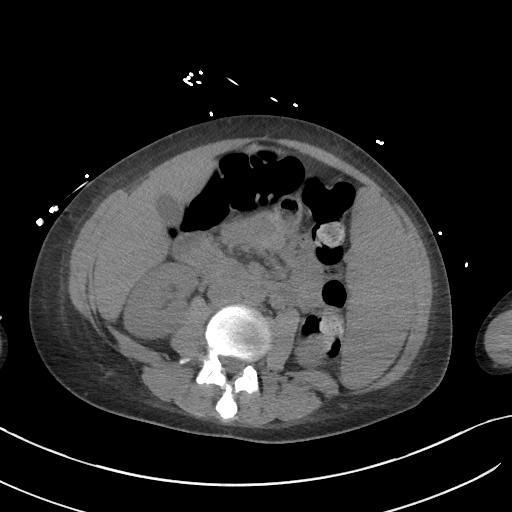
[im 51/88  bone]
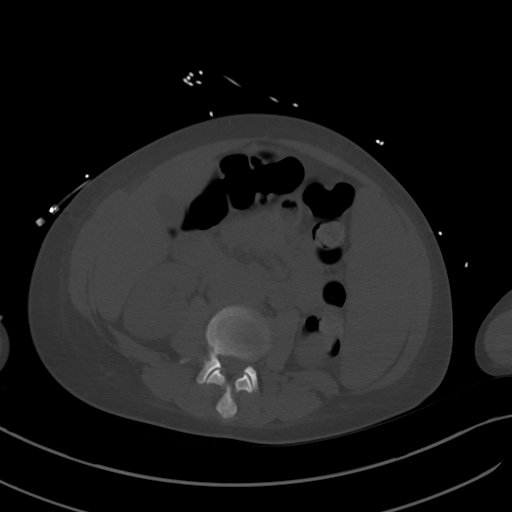
[im 59/88  soft-tissue]
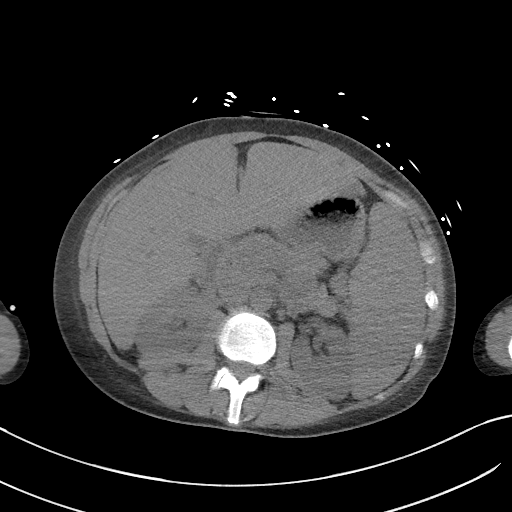
[im 66/88  soft-tissue]
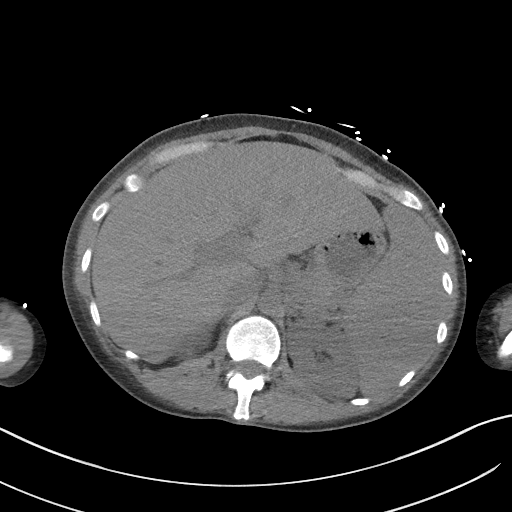
[im 69/88  soft-tissue]
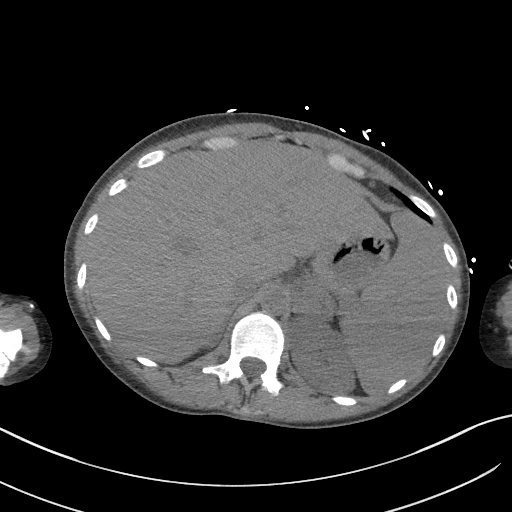
[im 77/88  soft-tissue]
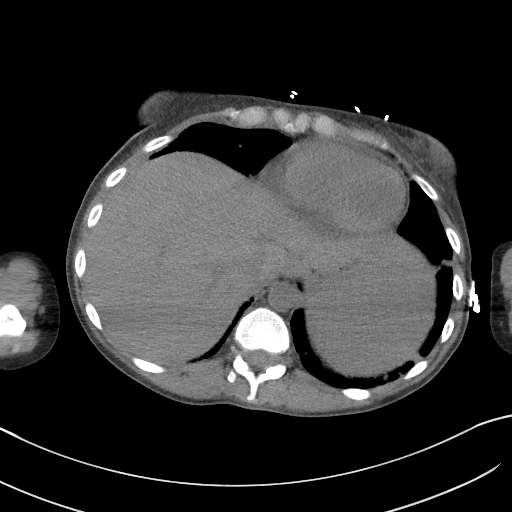
[im 84/88  soft-tissue]
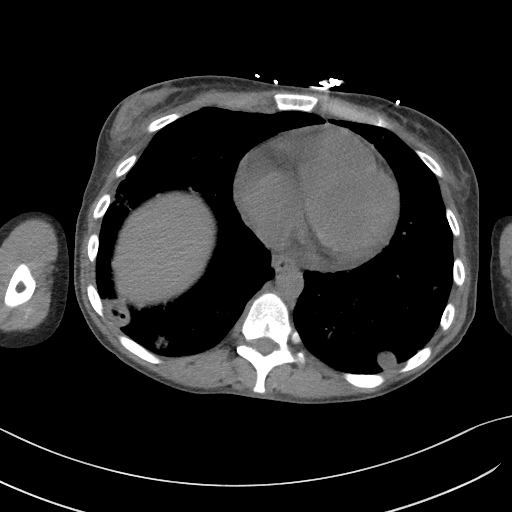

[Series 4: coronal st · coronal · 0.77mm/px · 3 of 92 slices shown]
[im 31/92  soft-tissue]
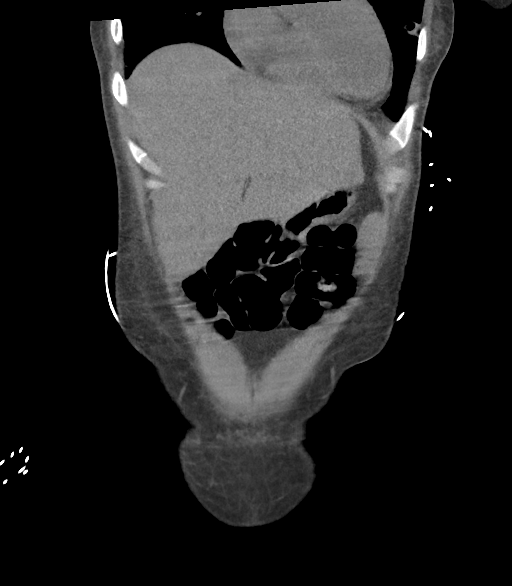
[im 41/92  soft-tissue]
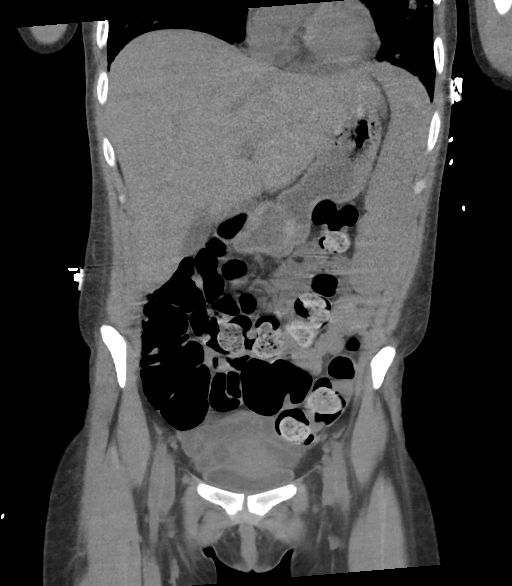
[im 51/92  soft-tissue]
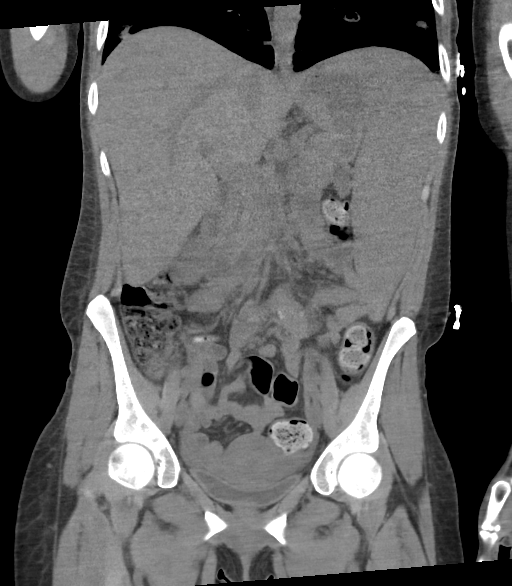

[17 of 46 positions shown; findings below may reference images not displayed]

FINDINGS: Lower chest: Numerous bilateral pulmonary nodules in the visualized
lower lungs, many of which are cavitary. Appearance is concerning
for septic emboli or atypical infection. Heart is normal size. Small
pericardial effusion. No pleural effusions.

Hepatobiliary: No focal hepatic abnormality. Gallbladder
unremarkable.

Pancreas: No focal abnormality or ductal dilatation.

Spleen: Spleen is enlarged with a craniocaudal length of the spleen
measuring 19.4 cm.

Adrenals/Urinary Tract: No renal or ureteral stones. No
hydronephrosis.

Stomach/Bowel: Moderate stool burden throughout the colon. Stomach,
large and small bowel grossly unremarkable.

Vascular/Lymphatic:  No evidence of aneurysm or adenopathy.

Reproductive: Uterus and adnexa unremarkable.  No mass.

Other: No free fluid or free air.

Musculoskeletal: No acute bony abnormality.
IMPRESSION: Numerous bilateral cavitary and non cavitary pulmonary nodules. This
is concerning for septic emboli or atypical/fungal infection.

Splenomegaly.

Moderate stool burden throughout the colon.

No renal or ureteral stones.  No hydronephrosis.

## 2019-05-19 MED ORDER — PRENATAL MULTIVITAMIN CH
1.0000 | ORAL_TABLET | Freq: Every day | ORAL | Status: DC
  Administered 2019-05-19 – 2019-06-30 (×38): 1 via ORAL
  Filled 2019-05-19 (×42): qty 1

## 2019-05-19 MED ORDER — MAGNESIUM SALICYLATE 325 MG PO TABS: 325.0000 mg | ORAL_TABLET | Freq: Every day | ORAL | Status: DC

## 2019-05-19 MED ORDER — IBUPROFEN 800 MG PO TABS
800.0000 mg | ORAL_TABLET | Freq: Once | ORAL | Status: AC
  Administered 2019-05-19: 800 mg via ORAL
  Filled 2019-05-19: qty 1

## 2019-05-19 MED ORDER — ONDANSETRON HCL 4 MG/2ML IJ SOLN
4.0000 mg | Freq: Four times a day (QID) | INTRAMUSCULAR | Status: DC | PRN
  Administered 2019-05-24 – 2019-06-15 (×13): 4 mg via INTRAVENOUS
  Filled 2019-05-19 (×15): qty 2

## 2019-05-19 MED ORDER — SODIUM CHLORIDE 0.9 % IV SOLN: INTRAVENOUS | Status: DC

## 2019-05-19 MED ORDER — SODIUM CHLORIDE 0.9 % IV SOLN: INTRAVENOUS | Status: AC

## 2019-05-19 MED ORDER — IBUPROFEN 400 MG PO TABS
400.0000 mg | ORAL_TABLET | Freq: Three times a day (TID) | ORAL | Status: AC | PRN
  Administered 2019-05-19 – 2019-05-23 (×4): 400 mg via ORAL
  Filled 2019-05-19 (×4): qty 1

## 2019-05-19 MED ORDER — SODIUM CHLORIDE 0.9 % IV SOLN
2.0000 g | Freq: Three times a day (TID) | INTRAVENOUS | Status: DC
  Administered 2019-05-19 (×3): 2 g via INTRAVENOUS
  Filled 2019-05-19 (×5): qty 2

## 2019-05-19 MED ORDER — ALBUMIN HUMAN 5 % IV SOLN
25.0000 g | Freq: Once | INTRAVENOUS | Status: AC
  Administered 2019-05-19: 25 g via INTRAVENOUS
  Filled 2019-05-19: qty 500

## 2019-05-19 MED ORDER — LEVOFLOXACIN IN D5W 750 MG/150ML IV SOLN: 750.0000 mg | INTRAVENOUS | Status: DC

## 2019-05-19 MED ORDER — METHADONE HCL 10 MG PO TABS
30.0000 mg | ORAL_TABLET | Freq: Every day | ORAL | Status: DC
  Administered 2019-05-20: 30 mg via ORAL
  Filled 2019-05-19: qty 3

## 2019-05-19 MED ORDER — IBUPROFEN 600 MG PO TABS: 600.0000 mg | ORAL_TABLET | Freq: Four times a day (QID) | ORAL | Status: DC | PRN

## 2019-05-19 MED ORDER — ACETAMINOPHEN 325 MG PO TABS: 650.0000 mg | ORAL_TABLET | Freq: Four times a day (QID) | ORAL | Status: DC | PRN

## 2019-05-19 MED ORDER — SODIUM CHLORIDE 0.9 % IV BOLUS
30.0000 mL/kg | Freq: Once | INTRAVENOUS | Status: AC
  Administered 2019-05-19: 1730 mL via INTRAVENOUS

## 2019-05-19 MED ORDER — ONDANSETRON HCL 4 MG PO TABS
4.0000 mg | ORAL_TABLET | Freq: Four times a day (QID) | ORAL | Status: DC | PRN
  Administered 2019-06-05: 4 mg via ORAL
  Filled 2019-05-19: qty 1

## 2019-05-19 MED ORDER — KETOROLAC TROMETHAMINE 15 MG/ML IJ SOLN
15.0000 mg | Freq: Four times a day (QID) | INTRAMUSCULAR | Status: DC | PRN
  Administered 2019-05-19: 15 mg via INTRAVENOUS
  Filled 2019-05-19: qty 1

## 2019-05-19 MED ORDER — FERROUS SULFATE 325 (65 FE) MG PO TABS
325.0000 mg | ORAL_TABLET | Freq: Two times a day (BID) | ORAL | Status: DC
  Administered 2019-05-19 – 2019-06-30 (×79): 325 mg via ORAL
  Filled 2019-05-19 (×82): qty 1

## 2019-05-19 MED ORDER — IBUPROFEN 400 MG PO TABS: 400.0000 mg | ORAL_TABLET | Freq: Four times a day (QID) | ORAL | Status: DC | PRN

## 2019-05-19 MED ORDER — ENOXAPARIN SODIUM 40 MG/0.4ML ~~LOC~~ SOLN: 40.0000 mg | Freq: Every day | SUBCUTANEOUS | Status: DC

## 2019-05-19 MED ORDER — KETOROLAC TROMETHAMINE 15 MG/ML IJ SOLN
15.0000 mg | Freq: Four times a day (QID) | INTRAMUSCULAR | Status: AC | PRN
  Administered 2019-05-19 – 2019-05-24 (×9): 15 mg via INTRAVENOUS
  Filled 2019-05-19 (×9): qty 1

## 2019-05-19 MED ORDER — VANCOMYCIN HCL IN DEXTROSE 1-5 GM/200ML-% IV SOLN
1000.0000 mg | Freq: Once | INTRAVENOUS | Status: AC
  Administered 2019-05-19: 1000 mg via INTRAVENOUS
  Filled 2019-05-19: qty 200

## 2019-05-19 MED ORDER — VANCOMYCIN HCL 750 MG/150ML IV SOLN
750.0000 mg | Freq: Two times a day (BID) | INTRAVENOUS | Status: DC
  Administered 2019-05-19 – 2019-05-20 (×4): 750 mg via INTRAVENOUS
  Filled 2019-05-19 (×5): qty 150

## 2019-05-19 MED ORDER — LACTATED RINGERS IV BOLUS
1000.0000 mL | Freq: Once | INTRAVENOUS | Status: AC
  Administered 2019-05-19: 1000 mL via INTRAVENOUS

## 2019-05-19 MED ORDER — SODIUM CHLORIDE 0.9 % IV BOLUS
1000.0000 mL | Freq: Once | INTRAVENOUS | Status: AC
  Administered 2019-05-19: 05:00:00 1000 mL via INTRAVENOUS

## 2019-05-19 MED ORDER — SODIUM CHLORIDE 0.9% IV SOLUTION: Freq: Once | INTRAVENOUS | Status: AC

## 2019-05-19 MED ORDER — LEVOFLOXACIN IN D5W 500 MG/100ML IV SOLN: 500.0000 mg | INTRAVENOUS | Status: DC

## 2019-05-19 NOTE — Progress Notes (Signed)
  Echocardiogram 2D Echocardiogram has been performed.  Christy Pearson 05/19/2019, 10:42 AM

## 2019-05-19 NOTE — Progress Notes (Signed)
Pharmacy Antibiotic Note  Christy Pearson is a 29 y.o. female admitted on 05/18/2019 with UTI.  Pharmacy has been consulted for Levaquin dosing. WBC 10.3. Renal function good. Urine cloudy/nitrite+. Pt is anemic.   Plan: Levaquin 500 mg IV q24h Trend WBC, temp, renal function  F/U urine culture   Weight: 127 lb 3.2 oz (57.7 kg)  Temp (24hrs), Avg:102 F (38.9 C), Min:99.4 F (37.4 C), Max:103.7 F (39.8 C)  Recent Labs  Lab 05/18/19 2252 05/18/19 2338 05/19/19 0126  WBC 10.3  --   --   CREATININE 0.90 0.90  --   LATICACIDVEN 2.4*  --  1.7    CrCl cannot be calculated (Unknown ideal weight.).    Allergies  Allergen Reactions  . Tylenol [Acetaminophen] Rash    Pt just reported tylenol allergy at this visit at 2338pm  . Penicillins     Abran Duke, PharmD, BCPS Clinical Pharmacist Phone: [phone]

## 2019-05-19 NOTE — Consult Note (Signed)
[facility]    Date of Admission:  05/18/2019     Total days of antibiotics 0   Vancomycin + Cefepime                Reason for Consult: B/L cavitary pneumonia, ?endocarditis     Referring Provider: Rodena Piety Primary Care Provider: Patient, No Pcp Per    Assessment:  Christy Pearson is a 29 y.o. female with fever, weakness, back pain and findings on CT indicating cavitary septic pulmonary emboli bilaterally. Blood cultures are currently in process; with known h/o substance use disorder including injection constellation concerning for right sided bacterial endocarditis. Will continue on vancomycin + cefepime. Follow blood cultures, follow TEE report.   Back pain = would get MRI W and WO of T- and L-spine to assess osteomyelitis/discitis; no concerning neurologic findings of cord compression epidural abscess but imaging needed to rule out. Follow blood cultures. CRP elevated.   Transaminitis = verbal report of hepatitis C history. Maybe related to chronic infection vs hepatocardiac congestion with suspected right sided endocarditis. U/S pending per Dr. Rodena Piety. Will screening Hep B sAg and ensure Hep C RNA quant has been sent to determine active infection.   Substance use disorder with h/o injection =  Chart review indicates a long history of substance use disorder (heroin, cocaine, methamphetamines with UDS + opiates and amphetamines during current admission). We did not discuss history in detail today given she was comfortably resting and I am not sure how much her mother knows about history. Seems to be ongoing problem for her.  Previously on methadone/Suboxone replacement. Hepatitis screening as outlined above, HIV recently negative.   Would not think she needs another COVID test prior to TEE as cavitary findings not a typical finding in COVID and her risk factors point to bacterial process.     Plan: 1. Continue vancomycin  2. Continue  cefepime 3. Check MRI W and WO T- and L- spine  4. Follow blood cultures  5. Follow up Hep B sAg and Hep C RNA 6. Please do not order another COVID test     Principal Problem:   Suspected endocarditis Active Problems:   Septic pulmonary embolism (HCC)   Injection of illicit drug within last 12 months   History of hepatitis C, self reported   Sepsis ([place])   Substance use disorder   . sodium chloride   Intravenous Once  . ferrous sulfate  325 mg Oral BID  . methadone  30 mg Oral Daily  . prenatal multivitamin  1 tablet Oral Daily    HPI: Christy Pearson is a 29 y.o. female with past medical history including substance use disorder (dating back to 2014 per chart review). Here from home for evaluation for fevers/chills, back pain and weakness.   She and her mother provide history, although Palmer falls asleep during much of the conversation. She has been feeling ill for 2 weeks now with fevers, chest pain, cough, back/flank pain. She has been back with her mother now for 5 days. Her mother, Lovey Newcomer, reports that she has basically been in bed the entire time she has been home. Only wakes/moves to use the bathroom. Appetite and intake have been decreased. They were thinking she may have had a kidney stone with the flank pain. She has been intolerant of any activity from energy/breathing standpoint. Has not needed more than 2 pillows for sleep to get comfortable. No swelling to the legs/abdomen  noted. She does have mid/upper back/flank pain.   During hospitalization she has had high fever 103.7 F. Normal WBC count. Blood cultures have been drawn and pending from late 05/18/19. She is currently on cefepime + vancomycin tolerating well at this time. She received one dose of levaquin in ER for presumed pylonephritis with abnormal U/A. CT stone scan reveals bilateral cavitary nodules/pneumonia concerning for septic emboli. She has been seen by cardiology for concern over bacterial endocarditis and  pending for TEE Monday 2/01, loud murmur 3/5 systolic identified.    LFTs elevated, Anemic with Hgb 7.0 (chronic but unclear what baseline is).  RVP and COVID-19 negative.  Blood cultures pending  Review of Systems: Review of Systems  Constitutional: Positive for chills, fever, malaise/fatigue and weight loss.  HENT: Negative for sore throat.   Respiratory: Positive for cough and shortness of breath. Negative for hemoptysis, sputum production and wheezing.   Cardiovascular: Negative for chest pain and leg swelling.  Gastrointestinal: Negative for abdominal pain, nausea and vomiting.  Genitourinary: Positive for flank pain. Negative for dysuria.  Musculoskeletal: Positive for back pain. Negative for falls and neck pain.  Skin: Negative for itching and rash.  Neurological: Positive for weakness. Negative for focal weakness and headaches.    Past Medical History:  Diagnosis Date  . Hepatitis C   . Heroin abuse (HCC)   . Kidney stones     Social History   Tobacco Use  . Smoking status: Current Every Day Smoker    Packs/day: 1.00    Years: 5.00    Pack years: 5.00    Types: Cigarettes  . Smokeless tobacco: Never Used  Substance Use Topics  . Alcohol use: No  . Drug use: Yes    Types: Heroin    Comment: Heroin, Xanax    History reviewed. No pertinent family history. Allergies  Allergen Reactions  . Tylenol [Acetaminophen] Rash    Pt just reported tylenol allergy at this visit at 2338pm  . Penicillins     OBJECTIVE: Blood pressure (!) 90/58, pulse (!) 123, temperature 100.2 F (37.9 C), temperature source Oral, resp. rate 13, height 5\' 7"  (1.702 m), weight 56.5 kg, last menstrual period 04/27/2019, SpO2 97 %, unknown if currently breastfeeding.  Physical Exam Constitutional:      Appearance: She is not ill-appearing.     Comments: Sleepy, easy to wake up but soft spoken and turns to mom for much of the questions.   HENT:     Nose: Nose normal.     Mouth/Throat:      Mouth: Mucous membranes are dry.     Pharynx: No oropharyngeal exudate.  Eyes:     General: No scleral icterus.    Pupils: Pupils are equal, round, and reactive to light.  Cardiovascular:     Rate and Rhythm: Tachycardia present.     Heart sounds: Murmur (systolic) present.  Pulmonary:     Effort: Pulmonary effort is normal.     Breath sounds: Normal breath sounds.     Comments: Shallow respiratory effort.  Abdominal:     General: Bowel sounds are normal. There is no distension.  Musculoskeletal:        General: Normal range of motion.     Cervical back: No tenderness.  Skin:    General: Skin is warm and dry.     Coloration: Skin is pale. Skin is not jaundiced.  Neurological:     Mental Status: She is oriented to person, place, and time.  Psychiatric:  Mood and Affect: Mood normal.     Lab Results Lab Results  Component Value Date   WBC 10.3 05/18/2019   HGB 7.0 (L) 05/18/2019   HCT 22.3 (L) 05/18/2019   MCV 73.8 (L) 05/18/2019   PLT 263 05/18/2019    Lab Results  Component Value Date   CREATININE 0.87 05/19/2019   BUN 13 05/18/2019   NA 127 (L) 05/18/2019   K 4.7 05/18/2019   CL 97 (L) 05/18/2019   CO2 22 05/18/2019    Lab Results  Component Value Date   ALT 69 (H) 05/18/2019   AST 149 (H) 05/18/2019   ALKPHOS 243 (H) 05/18/2019   BILITOT 1.3 (H) 05/18/2019     Microbiology: Recent Results (from the past 240 hour(s))  SARS CORONAVIRUS 2 (TAT 6-24 HRS) Nasopharyngeal In/Out Cath Urine     Status: None   Collection Time: 05/18/19 11:39 PM   Specimen: In/Out Cath Urine; Nasopharyngeal  Result Value Ref Range Status   SARS Coronavirus 2 NEGATIVE NEGATIVE Final    Comment: (NOTE) SARS-CoV-2 target nucleic acids are NOT DETECTED. The SARS-CoV-2 RNA is generally detectable in upper and lower respiratory specimens during the acute phase of infection. Negative results do not preclude SARS-CoV-2 infection, do not rule out co-infections with other  pathogens, and should not be used as the sole basis for treatment or other patient management decisions. Negative results must be combined with clinical observations, patient history, and epidemiological information. The expected result is Negative. Fact Sheet for Patients: HairSlick.no Fact Sheet for Healthcare Providers: quierodirigir.com This test is not yet approved or cleared by the [place] FDA and  has been authorized for detection and/or diagnosis of SARS-CoV-2 by FDA under an Emergency Use Authorization (EUA). This EUA will remain  in effect (meaning this test can be used) for the duration of the COVID-19 declaration under Section 56 4(b)(1) of the Act, 21 U.S.C. section 360bbb-3(b)(1), unless the authorization is terminated or revoked sooner. Performed at [facility], [address] [place], [place] [address]   SARS Coronavirus 2 Ag (30 min TAT) - In/Out Cath Urine     Status: None   Collection Time: 05/18/19 11:40 PM   Specimen: In/Out Cath Urine; Nasal Swab  Result Value Ref Range Status   SARS Coronavirus 2 Ag NEGATIVE NEGATIVE Final    Comment: (NOTE) SARS-CoV-2 antigen NOT DETECTED.  Negative results are presumptive.  Negative results do not preclude SARS-CoV-2 infection and should not be used as the sole basis for treatment or other patient management decisions, including infection  control decisions, particularly in the presence of clinical signs and  symptoms consistent with COVID-19, or in those who have been in contact with the virus.  Negative results must be combined with clinical observations, patient history, and epidemiological information. The expected result is Negative. Fact Sheet for Patients: https://sanders-williams.net/ Fact Sheet for Healthcare Providers: https://martinez.com/ This test is not yet approved or cleared by the [place] FDA and  has  been authorized for detection and/or diagnosis of SARS-CoV-2 by FDA under an Emergency Use Authorization (EUA).  This EUA will remain in effect (meaning this test can be used) for the duration of  the COVID-19 de claration under Section 564(b)(1) of the Act, 21 U.S.C. section 360bbb-3(b)(1), unless the authorization is terminated or revoked sooner. Performed at [facility], [address] [place], [place] [address]   Respiratory Panel by PCR     Status: None   Collection Time:  05/19/19  5:58 AM   Specimen: Nasopharyngeal Swab; Respiratory  Result Value Ref Range Status   Adenovirus NOT DETECTED NOT DETECTED Final   Coronavirus 229E NOT DETECTED NOT DETECTED Final    Comment: (NOTE) The Coronavirus on the Respiratory Panel, DOES NOT test for the novel  Coronavirus (2019 nCoV)    Coronavirus HKU1 NOT DETECTED NOT DETECTED Final   Coronavirus NL63 NOT DETECTED NOT DETECTED Final   Coronavirus OC43 NOT DETECTED NOT DETECTED Final   Metapneumovirus NOT DETECTED NOT DETECTED Final   Rhinovirus / Enterovirus NOT DETECTED NOT DETECTED Final   Influenza A NOT DETECTED NOT DETECTED Final   Influenza B NOT DETECTED NOT DETECTED Final   Parainfluenza Virus 1 NOT DETECTED NOT DETECTED Final   Parainfluenza Virus 2 NOT DETECTED NOT DETECTED Final   Parainfluenza Virus 3 NOT DETECTED NOT DETECTED Final   Parainfluenza Virus 4 NOT DETECTED NOT DETECTED Final   Respiratory Syncytial Virus NOT DETECTED NOT DETECTED Final   Bordetella pertussis NOT DETECTED NOT DETECTED Final   Chlamydophila pneumoniae NOT DETECTED NOT DETECTED Final   Mycoplasma pneumoniae NOT DETECTED NOT DETECTED Final    Comment: Performed at [facility], [address] [place], [place] [address]    Rexene Alberts, MSN, NP-C [facility] for Infectious Disease [facility] Cell: [phone] Pager: [phone]  05/19/2019 2:40 PM

## 2019-05-19 NOTE — ED Provider Notes (Signed)
Code  sepsis initiated by me in response to vital signs.   MDM Reviewed: nursing note and vitals Interpretation: CT scan, x-ray, labs and ECG (bandemia and anemia urine consistent with pyelo.  CXR with PNA) Total time providing critical care: 30-74 minutes (IVF anf antibiotics initiated ). This excludes time spent performing separately reportable procedures and services. Consults: admitting MD   CRITICAL CARE Performed by: Maan Zarcone K Zabria Liss-Rasch Total critical care time: 60 minutes Critical care time was exclusive of separately billable procedures and treating other patients. Critical care was necessary to treat or prevent imminent or life-threatening deterioration. Critical care was time spent personally by me on the following activities: development of treatment plan with patient and/or surrogate as well as nursing, discussions with consultants, evaluation of patient's response to treatment, examination of patient, obtaining history from patient or surrogate, ordering and performing treatments and interventions, ordering and review of laboratory studies, ordering and review of radiographic studies, pulse oximetry and re-evaluation of patient's condition.    Christy Bridwell, MD 05/19/19 1245

## 2019-05-19 NOTE — Plan of Care (Signed)
  Problem: Health Behavior/Discharge Planning: Goal: Ability to manage health-related needs will improve Outcome: Not Progressing   

## 2019-05-19 NOTE — H&P (Addendum)
History and Physical    Christy Pearson LZJ:[id number] DOB: 12-07-1990 DOA: 05/18/2019  PCP: Patient, No Pcp Per    Patient coming from: Home  transfer from another hospital   Chief Complaint: Fever chills hypotension back pain General weakness  HPI: Christy Pearson is a 29 y.o. female with medical history significant of   ED Course:  ED note from nurse practitioner Patient reports right flank pain for over one week. She thinks she has a kidney stone. History of same. No reported hematuria. Patient is febrile and tachycardic. She states she fell down some stairs yesterday, but denies any injuries to me. She reported to the nurse that she was having arm pain from the fall.  Patient was found to be septic, suspicion of pyelonephritis started on Levaquin, IV fluids per sepsis protocol CT abdomen and pelvis showed bilateral pulmonary nodule With suspicion of septic emboli Transfer to our hospital ACCEPTED BY DR Hal Hope  Review of Systems: As per HPI otherwise 10 point review of systems negative.  Exception of shortness of breath fever chills general weakness lower back pain  Past Medical History:  Diagnosis Date  . Hepatitis C   . Heroin abuse ([place])   . Kidney stones     Past Surgical History:  Procedure Laterality Date  . CESAREAN SECTION  02/02/2012     reports that she has been smoking cigarettes. She has a 5.00 pack-year smoking history. She has never used smokeless tobacco. She reports current drug use. Drug: Heroin. She reports that she does not drink alcohol.  Allergies  Allergen Reactions  . Tylenol [Acetaminophen] Rash    Pt just reported tylenol allergy at this visit at 2338pm  . Penicillins     History reviewed. No pertinent family history.   Prior to Admission medications   Medication Sig Start Date End Date Taking? Authorizing Provider  ibuprofen (ADVIL) 600 MG tablet Take 600 mg by mouth every 6 (six) hours as needed.   Yes [provider]    doxycycline (VIBRAMYCIN) 100 MG capsule Take 1 capsule (100 mg total) by mouth 2 (two) times daily. 07/01/17   Ashley Murrain, NP  ferrous sulfate (FERROUSUL) 325 (65 FE) MG tablet Take 1 tablet (325 mg total) by mouth 2 (two) times daily. 10/02/15   Wouk, Ailene Rud, MD  Magnesium Salicylate 024 MG TABS Take 325 mg by mouth daily.    [provider]  methadone (DOLOPHINE) 10 MG tablet Take 55 mg by mouth daily.    [provider]  Prenatal Multivit-Min-Fe-FA (PRENATAL VITAMINS) 0.8 MG tablet Take 1 tablet by mouth daily. 10/02/15   Gwynne Edinger, MD    Physical Exam: Vitals:   05/19/19 0300 05/19/19 0400 05/19/19 0434 05/19/19 0752  BP: (!) 95/59  (!) 78/50 (!) 82/57  Pulse: 87  81   Resp: 17  12 14   Temp:    97.6 F (36.4 C)  TempSrc:   Oral Oral  SpO2: 96%  100% 100%  Weight:  56.5 kg    Height:  5\' 7"  (1.702 m)      Constitutional: NAD, calm, comfortable Vitals:   05/19/19 0300 05/19/19 0400 05/19/19 0434 05/19/19 0752  BP: (!) 95/59  (!) 78/50 (!) 82/57  Pulse: 87  81   Resp: 17  12 14   Temp:    97.6 F (36.4 C)  TempSrc:   Oral Oral  SpO2: 96%  100% 100%  Weight:  56.5 kg    Height:  5\' 7"  (1.702  m)     Eyes: PERRL, lids and conjunctivae normal ENMT: Mucous membranes are moist. Posterior pharynx clear of any exudate or lesions.Normal dentition.  Neck: normal, supple, no masses, no thyromegaly Respiratory: clear to auscultation bilaterally, no wheezing, no crackles. Normal respiratory effort. No accessory muscle use.  Cardiovascular: Regular rate and rhythm, systolic murmurs / rubs / gallops. No extremity edema. 2+ pedal pulses. No carotid bruits.  Abdomen: no tenderness, no masses palpated. No hepatosplenomegaly. Bowel sounds positive.  Musculoskeletal: no clubbing / cyanosis. No joint deformity upper and lower extremities. Good ROM, no contractures. Normal muscle tone.  Skin: no rashes, lesions, ulcers. No induration Neurologic: CN 2-12 grossly  intact. Sensation intact, DTR normal. Strength 5/5 in all 4.  Psychiatric: Normal judgment and insight. Alert and oriented x 3. Normal mood.    Labs on Admission: I have personally reviewed following labs and imaging studies  CBC: Recent Labs  Lab 05/18/19 2252  WBC 10.3  NEUTROABS 8.5*  HGB 7.0*  HCT 22.3*  MCV 73.8*  PLT 263   Basic Metabolic Panel: Recent Labs  Lab 05/18/19 2252 05/18/19 2338 05/19/19 0728  NA 128* 127*  --   K 4.8 4.7  --   CL 96* 97*  --   CO2 23 22  --   GLUCOSE 125* 125*  --   BUN 14 13  --   CREATININE 0.90 0.90 0.87  CALCIUM 8.2* 8.0*  --    GFR: Estimated Creatinine Clearance: 87.4 mL/min (by C-G formula based on SCr of 0.87 mg/dL). Liver Function Tests: Recent Labs  Lab 05/18/19 2338  AST 149*  ALT 69*  ALKPHOS 243*  BILITOT 1.3*  PROT 6.3*  ALBUMIN 1.9*   No results for input(s): LIPASE, AMYLASE in the last 168 hours. No results for input(s): AMMONIA in the last 168 hours. Coagulation Profile: Recent Labs  Lab 05/18/19 2338  INR 1.3*   Cardiac Enzymes: No results for input(s): CKTOTAL, CKMB, CKMBINDEX, TROPONINI in the last 168 hours. BNP (last 3 results) No results for input(s): PROBNP in the last 8760 hours. HbA1C: No results for input(s): HGBA1C in the last 72 hours. CBG: No results for input(s): GLUCAP in the last 168 hours. Lipid Profile: No results for input(s): CHOL, HDL, LDLCALC, TRIG, CHOLHDL, LDLDIRECT in the last 72 hours. Thyroid Function Tests: No results for input(s): TSH, T4TOTAL, FREET4, T3FREE, THYROIDAB in the last 72 hours. Anemia Panel: No results for input(s): VITAMINB12, FOLATE, FERRITIN, TIBC, IRON, RETICCTPCT in the last 72 hours. Urine analysis:    Component Value Date/Time   COLORURINE YELLOW 05/18/2019 2252   APPEARANCEUR CLOUDY (A) 05/18/2019 2252   LABSPEC 1.020 05/18/2019 2252   PHURINE 5.5 05/18/2019 2252   GLUCOSEU NEGATIVE 05/18/2019 2252   HGBUR NEGATIVE 05/18/2019 2252    BILIRUBINUR MODERATE (A) 05/18/2019 2252   KETONESUR NEGATIVE 05/18/2019 2252   PROTEINUR 30 (A) 05/18/2019 2252   UROBILINOGEN 0.2 10/28/2015 1453   NITRITE POSITIVE (A) 05/18/2019 2252   LEUKOCYTESUR TRACE (A) 05/18/2019 2252    Radiological Exams on Admission: DG Chest Port 1 View  Result Date: 05/19/2019 CLINICAL DATA:  Sepsis, pain EXAM: PORTABLE CHEST 1 VIEW COMPARISON:  06/14/2014 FINDINGS: Patchy airspace opacities noted in both lungs, right greater than left with somewhat peripheral predilection. No effusions. Heart is normal size. No acute bony abnormality. IMPRESSION: Patchy bilateral airspace disease concerning for pneumonia. Electronically Signed   By: Charlett Nose M.D.   On: 05/19/2019 00:31   CT Renal Stone Study  Result Date: 05/19/2019 CLINICAL DATA:  Back pain.  History of hepatitis-C.  Kidney stones. EXAM: CT ABDOMEN AND PELVIS WITHOUT CONTRAST TECHNIQUE: Multidetector CT imaging of the abdomen and pelvis was performed following the standard protocol without IV contrast. COMPARISON:  06/13/2017 FINDINGS: Lower chest: Numerous bilateral pulmonary nodules in the visualized lower lungs, many of which are cavitary. Appearance is concerning for septic emboli or atypical infection. Heart is normal size. Small pericardial effusion. No pleural effusions. Hepatobiliary: No focal hepatic abnormality. Gallbladder unremarkable. Pancreas: No focal abnormality or ductal dilatation. Spleen: Spleen is enlarged with a craniocaudal length of the spleen measuring 19.4 cm. Adrenals/Urinary Tract: No renal or ureteral stones. No hydronephrosis. Stomach/Bowel: Moderate stool burden throughout the colon. Stomach, large and small bowel grossly unremarkable. Vascular/Lymphatic:  No evidence of aneurysm or adenopathy. Reproductive: Uterus and adnexa unremarkable.  No mass. Other: No free fluid or free air. Musculoskeletal: No acute bony abnormality. IMPRESSION: Numerous bilateral cavitary and non cavitary  pulmonary nodules. This is concerning for septic emboli or atypical/fungal infection. Splenomegaly. Moderate stool burden throughout the colon. No renal or ureteral stones.  No hydronephrosis. Electronically Signed   By: Charlett Nose M.D.   On: 05/19/2019 00:45    EKG: Independently reviewed.  Sinus tachycardia no acute ST-T changes  Assessment plan  Severe sepsis Secondary probably to bacteremia, endocarditis IV drug abuser Urine analysis positive for UTI, pyelonephritis Plan blood culture, serial lactic acid, fluid challenge, Maxipime and vancomycin Patient had 1 dose of Levaquin Critical care consult if blood pressure still mean less than 65 after IV fluids For possible pressor Patient is tachycardic because of sepsis and high fever Plan ibuprofen 600 mg every 4 patient allergic to Tylenol  UTI Urinalysis positive for UTI Urine cultures Maxipime  Bilateral pulmonary nodules Rule out septic emboli Plan vancomycin Maxipime Infectious disease consult, cardiology consult echocardiogram, TEE  Anemia secondary to sepsis Hemoglobin 7.0 Transfuse 1 unit of blood H&H every 8 hours  Transaminitis Secondary to sepsis History of hepatitis C Check for HIV  IV drug abuse Patient denies   Assessment/Plan Active Problems:   IV drug abuse (HCC)   Hepatitis C   Sepsis (HCC)   Pyelonephritis   Septic embolism (HCC)      DVT prophylaxis: scd Code Status: Full code Family Communication:  Disposition Plan: Discharge home Consults called: Need to consult cardiology for TEE, echo, infectious disease and possible critical care if blood pressure still low after fluid challenge Admission status: Full admission   Momo Braun G Ginnie Marich MD [facility]  If 7PM-7AM, please contact night-coverage [email or web address]   05/19/2019, 8:56 AM

## 2019-05-19 NOTE — Plan of Care (Signed)
29 year old female with history of heroin abuse IV drug abuse hepatitis C kidney stones admitted with fever chills and back pain.   Echo-normal ejection fraction.  Abnormal tricuspid valve tricuspid valve regurgitation moderate to severe.  Mitral valve is normal.  Anterior tricuspid leaflet appears markedly thickened and highly suspicious for endocarditis.  Aortic valve is normal.  Suspicious for tricuspid valve vegetation and CT scan show bilateral cavitary and noncavitary lung nodules concerning for septic emboli or atypical fungal infection on vancomycin and cefepime. Patient admitted [facility] this morning.  Vital signs 100/63, pulse is 111, respiration 24, 100% on room air.  She is febrile and tachycardic.  Labs White count 10.3 hemoglobin 7.0 platelet count 263 CRP 14.6 lactic acid 2.4 down to 1.7 Procalcitonin 6.14 Sodium 127 potassium 4.7 BUN 13 creatinine 0.90 alkaline phosphatase is 243 AST is 149 ALT is 69 CXR-Patchy bilateral airspace disease  CT shows numerous bilateral cavitary and noncavitary pulmonary nodules concerning for septic emboli or atypical fungal infection  Blood pressure 91/58 temperature 100.7 tachycardic at 113 I will start her on a fluid bolus. Fluid boluses ordered. Ibuprofen ordered for fever. Appreciate cardiology consult and ID consult. Patient scheduled for TEE on Monday. She has blood cultures pending. Anemia panel and FOBT sent. Right upper quadrant ultrasound ordered for abnormal LFTs.  Patient is very sick with sepsis endocarditis septic pulmonary embolism bilateral.   Discussed with mother who is in the room with the patient

## 2019-05-19 NOTE — Consult Note (Addendum)
CARDIOLOGY CONSULT NOTE  Patient ID: Christy Pearson MRN: [id number] DOB/AGE: July 08, 1990 29 y.o.  Admit date: 05/18/2019 Referring Physician: [facility] Reason for Consultation:  Concern for endocarditis  HPI:   29 y.o. Caucasian female  with history hepatitis C, kidney stones, IV drug abuse, admitted with fever, chills, back pain, fatigue.  Patient is known history of IV drug abuse.  She states that she last used IV heroin 2 months ago.  Of the last few days, she has had worsening lower back pain.  She was also found to have high grade fevers and generalized fatigue.  Echocardiogram was suspicious, although not definitive, for possible vegetation on tricuspid valve associated with severe tricuspid regurgitation.  Work-up also showed possible septic emboli in bilateral kidneys.  Patient is currently on IV cefepime and vancomycin.  Past Medical History:  Diagnosis Date  . Hepatitis C   . Heroin abuse (HCC)   . Kidney stones      Past Surgical History:  Procedure Laterality Date  . CESAREAN SECTION  02/02/2012     History reviewed. No pertinent family history.   Social History: Social History   Socioeconomic History  . Marital status: Married    Spouse name: Not on file  . Number of children: Not on file  . Years of education: Not on file  . Highest education level: Not on file  Occupational History  . Not on file  Tobacco Use  . Smoking status: Current Every Day Smoker    Packs/day: 1.00    Years: 5.00    Pack years: 5.00    Types: Cigarettes  . Smokeless tobacco: Never Used  Substance and Sexual Activity  . Alcohol use: No  . Drug use: Yes    Types: Heroin    Comment: Heroin, Xanax  . Sexual activity: Not Currently  Other Topics Concern  . Not on file  Social History Narrative  . Not on file   Social Determinants of Health   Financial Resource Strain:   . Difficulty of Paying Living Expenses: Not on file  Food Insecurity:   . Worried About Community education officer in the Last Year: Not on file  . Ran Out of Food in the Last Year: Not on file  Transportation Needs:   . Lack of Transportation (Medical): Not on file  . Lack of Transportation (Non-Medical): Not on file  Physical Activity:   . Days of Exercise per Week: Not on file  . Minutes of Exercise per Session: Not on file  Stress:   . Feeling of Stress : Not on file  Social Connections:   . Frequency of Communication with Friends and Family: Not on file  . Frequency of Social Gatherings with Friends and Family: Not on file  . Attends Religious Services: Not on file  . Active Member of Clubs or Organizations: Not on file  . Attends Banker Meetings: Not on file  . Marital Status: Not on file  Intimate Partner Violence:   . Fear of Current or Ex-Partner: Not on file  . Emotionally Abused: Not on file  . Physically Abused: Not on file  . Sexually Abused: Not on file     Medications Prior to Admission  Medication Sig Dispense Refill Last Dose  . ibuprofen (ADVIL) 600 MG tablet Take 600 mg by mouth every 6 (six) hours as needed.     . doxycycline (VIBRAMYCIN) 100 MG capsule Take 1 capsule (100 mg total) by mouth 2 (two) times daily.  20 capsule 0   . ferrous sulfate (FERROUSUL) 325 (65 FE) MG tablet Take 1 tablet (325 mg total) by mouth 2 (two) times daily. 120 tablet 4   . Magnesium Salicylate 325 MG TABS Take 325 mg by mouth daily.     . methadone (DOLOPHINE) 10 MG tablet Take 55 mg by mouth daily.     . Prenatal Multivit-Min-Fe-FA (PRENATAL VITAMINS) 0.8 MG tablet Take 1 tablet by mouth daily. 180 tablet 1     Review of Systems  Constitution: Positive for chills, fever and malaise/fatigue. Negative for decreased appetite, weight gain and weight loss.  HENT: Negative for congestion.   Eyes: Negative for visual disturbance.  Cardiovascular: Negative for chest pain, dyspnea on exertion, leg swelling, palpitations and syncope.       No peripheral stigmata of  endocarditis.  Respiratory: Negative for cough.   Endocrine: Negative for cold intolerance.  Hematologic/Lymphatic: Does not bruise/bleed easily.  Skin: Negative for itching and rash.  Musculoskeletal: Positive for back pain (Patient has exquisite lower back pain and would not let me examine her.). Negative for myalgias.  Gastrointestinal: Negative for abdominal pain, nausea and vomiting.  Genitourinary: Negative for dysuria.  Neurological: Negative for dizziness and weakness.  Psychiatric/Behavioral: The patient is not nervous/anxious.   All other systems reviewed and are negative.     Physical Exam: Physical Exam  Constitutional: She is oriented to person, place, and time.  Chronically ill-appearing.  Not in acute distress.  HENT:  Head: Normocephalic and atraumatic.  Eyes: Pupils are equal, round, and reactive to light. Conjunctivae are normal.  Neck: No JVD present.  Cardiovascular: Intact distal pulses. Tachycardia present.  Murmur heard.  Holosystolic murmur is present with a grade of 3/6 at the lower left sternal border and lower right sternal border. Pulmonary/Chest: Effort normal and breath sounds normal. She has no wheezes. She has no rales.  Abdominal: Soft. Bowel sounds are normal. There is no rebound.  Musculoskeletal:        General: No edema.  Lymphadenopathy:    She has no cervical adenopathy.  Neurological: She is alert and oriented to person, place, and time. No cranial nerve deficit.  Skin: Skin is warm and dry.  Psychiatric: She has a normal mood and affect.  Nursing note and vitals reviewed.    Labs:   Lab Results  Component Value Date   WBC 10.3 05/18/2019   HGB 7.0 (L) 05/18/2019   HCT 22.3 (L) 05/18/2019   MCV 73.8 (L) 05/18/2019   PLT 263 05/18/2019    Recent Labs  Lab 05/18/19 2338 05/18/19 2338 05/19/19 0728  NA 127*  --   --   K 4.7  --   --   CL 97*  --   --   CO2 22  --   --   BUN 13  --   --   CREATININE 0.90   < > 0.87   CALCIUM 8.0*  --   --   PROT 6.3*  --   --   BILITOT 1.3*  --   --   ALKPHOS 243*  --   --   ALT 69*  --   --   AST 149*  --   --   GLUCOSE 125*  --   --    < > = values in this interval not displayed.    Lipid Panel  No results found for: CHOL, TRIG, HDL, CHOLHDL, VLDL, LDLCALC  BNP (last 3 results) No results for input(s):  BNP in the last 8760 hours.  HEMOGLOBIN A1C No results found for: HGBA1C, MPG  Cardiac Panel (last 3 results) No results for input(s): CKTOTAL, CKMB, RELINDX in the last 8760 hours.  Invalid input(s): TROPONINHS  No results found for: CKTOTAL, CKMB, CKMBINDEX   TSH Recent Labs    05/19/19 0728  TSH 2.414      Radiology: [facility] Chest Port 1 View  Result Date: 05/19/2019 CLINICAL DATA:  Sepsis, pain EXAM: PORTABLE CHEST 1 VIEW COMPARISON:  06/14/2014 FINDINGS: Patchy airspace opacities noted in both lungs, right greater than left with somewhat peripheral predilection. No effusions. Heart is normal size. No acute bony abnormality. IMPRESSION: Patchy bilateral airspace disease concerning for pneumonia. Electronically Signed   By: Rolm Baptise M.D.   On: 05/19/2019 00:31   CT Renal Stone Study  Result Date: 05/19/2019 CLINICAL DATA:  Back pain.  History of hepatitis-C.  Kidney stones. EXAM: CT ABDOMEN AND PELVIS WITHOUT CONTRAST TECHNIQUE: Multidetector CT imaging of the abdomen and pelvis was performed following the standard protocol without IV contrast. COMPARISON:  06/13/2017 FINDINGS: Lower chest: Numerous bilateral pulmonary nodules in the visualized lower lungs, many of which are cavitary. Appearance is concerning for septic emboli or atypical infection. Heart is normal size. Small pericardial effusion. No pleural effusions. Hepatobiliary: No focal hepatic abnormality. Gallbladder unremarkable. Pancreas: No focal abnormality or ductal dilatation. Spleen: Spleen is enlarged with a craniocaudal length of the spleen measuring 19.4 cm. Adrenals/Urinary Tract:  No renal or ureteral stones. No hydronephrosis. Stomach/Bowel: Moderate stool burden throughout the colon. Stomach, large and small bowel grossly unremarkable. Vascular/Lymphatic:  No evidence of aneurysm or adenopathy. Reproductive: Uterus and adnexa unremarkable.  No mass. Other: No free fluid or free air. Musculoskeletal: No acute bony abnormality. IMPRESSION: Numerous bilateral cavitary and non cavitary pulmonary nodules. This is concerning for septic emboli or atypical/fungal infection. Splenomegaly. Moderate stool burden throughout the colon. No renal or ureteral stones.  No hydronephrosis. Electronically Signed   By: Rolm Baptise M.D.   On: 05/19/2019 00:45    Scheduled Meds: . sodium chloride   Intravenous Once  . ferrous sulfate  325 mg Oral BID  . methadone  55 mg Oral Daily  . prenatal multivitamin  1 tablet Oral Daily   Continuous Infusions: . sodium chloride 125 mL/hr at 05/19/19 0743  . ceFEPime (MAXIPIME) IV 2 g (05/19/19 0747)  . vancomycin 750 mg (05/19/19 1049)   PRN Meds:.ketorolac, ondansetron **OR** ondansetron (ZOFRAN) IV  CARDIAC STUDIES:  EKG 05/18/2019: Sinus tachycardia 124 bpm. Otherwise normal EKG.  Echocardiogram 05/19/2019: Final report pending. Normal LVEF. Severe TR, no definite evidence of vegetation, but suspicious. Mitral chordae also look suspicious for a vegetation.  Assessment & Recommendations:  29 y.o. Caucasian female  with history hepatitis C, kidney stones, IV drug abuse, admitted with fever, chills, back pain, fatigue.  Presentation concerning for pyelonephritis /- endocarditis. She is acutely ill with borderline hypotension, elevated inflammatory markers, anemia.  I do not see that she has had blood cultures.  Recommend blood cultures, especially if times with her fever spikes.  Presently, she meets 3 minor criteria for endocarditis-including predisposing risk factor, fever, vascular phenomenon with septic renal emboli.  Agree with  presumptively treating her for endocarditis.  She needs TEE for further evaluation of her valves, especially tricuspid and mitral valve.  Coupled with blood cultures, TEE will guide further management options.  Need to exclude large vegetation.  If present, may need to at least consider surgical options, given that she  is already had septic emboli to the kidneys.  Also recommend evaluating for any spinal infectious processes.  We will plan to perform TEE on 05/22/2019 morning.  Recommend repeating Covid test 1 more time before TEE.  Elder Negus, MD 05/19/2019, 11:32 AM [facility] [place] Pager: [phone] Office: [phone] If no answer Cell [phone]

## 2019-05-19 NOTE — Progress Notes (Signed)
Patient is shaking and complaining of being cold, multiple blankets applied on patient with few warm ones. Pt temp is 98.1. We'll continue to monitor.

## 2019-05-19 NOTE — Progress Notes (Signed)
Pharmacy Antibiotic Note  Christy Pearson is a 29 y.o. female admitted on 05/18/2019 with r/o sepsis, ?UTI.  Pharmacy has been consulted for Vancomycin/Cefepime dosing (broadened from Levaquin). Noted PCN allergy on profile. Pt tolerated Ceftriaxone in 2019.   Plan: Vancomycin 750 mg IV q12h >>Estimated AUC: 460 Cefepime 2g IV q8h Trend WBC, temp, renal function  F/U infectious work-up Drug levels as indicated   Height: 5\' 7"  (170.2 cm) Weight: 124 lb 9 oz (56.5 kg) IBW/kg (Calculated) : 61.6  Temp (24hrs), Avg:102 F (38.9 C), Min:99.4 F (37.4 C), Max:103.7 F (39.8 C)  Recent Labs  Lab 05/18/19 2252 05/18/19 2338 05/19/19 0126  WBC 10.3  --   --   CREATININE 0.90 0.90  --   LATICACIDVEN 2.4*  --  1.7    Estimated Creatinine Clearance: 84.5 mL/min (by C-G formula based on SCr of 0.9 mg/dL).    Allergies  Allergen Reactions  . Tylenol [Acetaminophen] Rash    Pt just reported tylenol allergy at this visit at 2338pm  . Penicillins    05/21/19, PharmD, BCPS Clinical Pharmacist Phone: [phone]

## 2019-05-19 NOTE — Telephone Encounter (Signed)
Did not need this encounter °

## 2019-05-19 NOTE — H&P (View-Only) (Signed)
CARDIOLOGY CONSULT NOTE  Patient ID: Christy Pearson MRN: [id number] DOB/AGE: July 08, 1990 28 y.o.  Admit date: 05/18/2019 Referring Physician: [facility] Reason for Consultation:  Concern for endocarditis  HPI:   29 y.o. Caucasian female  with history hepatitis C, kidney stones, IV drug abuse, admitted with fever, chills, back pain, fatigue.  Patient is known history of IV drug abuse.  She states that she last used IV heroin 2 months ago.  Of the last few days, she has had worsening lower back pain.  She was also found to have high grade fevers and generalized fatigue.  Echocardiogram was suspicious, although not definitive, for possible vegetation on tricuspid valve associated with severe tricuspid regurgitation.  Work-up also showed possible septic emboli in bilateral kidneys.  Patient is currently on IV cefepime and vancomycin.  Past Medical History:  Diagnosis Date  . Hepatitis C   . Heroin abuse (HCC)   . Kidney stones      Past Surgical History:  Procedure Laterality Date  . CESAREAN SECTION  02/02/2012     History reviewed. No pertinent family history.   Social History: Social History   Socioeconomic History  . Marital status: Married    Spouse name: Not on file  . Number of children: Not on file  . Years of education: Not on file  . Highest education level: Not on file  Occupational History  . Not on file  Tobacco Use  . Smoking status: Current Every Day Smoker    Packs/day: 1.00    Years: 5.00    Pack years: 5.00    Types: Cigarettes  . Smokeless tobacco: Never Used  Substance and Sexual Activity  . Alcohol use: No  . Drug use: Yes    Types: Heroin    Comment: Heroin, Xanax  . Sexual activity: Not Currently  Other Topics Concern  . Not on file  Social History Narrative  . Not on file   Social Determinants of Health   Financial Resource Strain:   . Difficulty of Paying Living Expenses: Not on file  Food Insecurity:   . Worried About Community education officer in the Last Year: Not on file  . Ran Out of Food in the Last Year: Not on file  Transportation Needs:   . Lack of Transportation (Medical): Not on file  . Lack of Transportation (Non-Medical): Not on file  Physical Activity:   . Days of Exercise per Week: Not on file  . Minutes of Exercise per Session: Not on file  Stress:   . Feeling of Stress : Not on file  Social Connections:   . Frequency of Communication with Friends and Family: Not on file  . Frequency of Social Gatherings with Friends and Family: Not on file  . Attends Religious Services: Not on file  . Active Member of Clubs or Organizations: Not on file  . Attends Banker Meetings: Not on file  . Marital Status: Not on file  Intimate Partner Violence:   . Fear of Current or Ex-Partner: Not on file  . Emotionally Abused: Not on file  . Physically Abused: Not on file  . Sexually Abused: Not on file     Medications Prior to Admission  Medication Sig Dispense Refill Last Dose  . ibuprofen (ADVIL) 600 MG tablet Take 600 mg by mouth every 6 (six) hours as needed.     . doxycycline (VIBRAMYCIN) 100 MG capsule Take 1 capsule (100 mg total) by mouth 2 (two) times daily.  20 capsule 0   . ferrous sulfate (FERROUSUL) 325 (65 FE) MG tablet Take 1 tablet (325 mg total) by mouth 2 (two) times daily. 120 tablet 4   . Magnesium Salicylate 325 MG TABS Take 325 mg by mouth daily.     . methadone (DOLOPHINE) 10 MG tablet Take 55 mg by mouth daily.     . Prenatal Multivit-Min-Fe-FA (PRENATAL VITAMINS) 0.8 MG tablet Take 1 tablet by mouth daily. 180 tablet 1     Review of Systems  Constitution: Positive for chills, fever and malaise/fatigue. Negative for decreased appetite, weight gain and weight loss.  HENT: Negative for congestion.   Eyes: Negative for visual disturbance.  Cardiovascular: Negative for chest pain, dyspnea on exertion, leg swelling, palpitations and syncope.       No peripheral stigmata of  endocarditis.  Respiratory: Negative for cough.   Endocrine: Negative for cold intolerance.  Hematologic/Lymphatic: Does not bruise/bleed easily.  Skin: Negative for itching and rash.  Musculoskeletal: Positive for back pain (Patient has exquisite lower back pain and would not let me examine her.). Negative for myalgias.  Gastrointestinal: Negative for abdominal pain, nausea and vomiting.  Genitourinary: Negative for dysuria.  Neurological: Negative for dizziness and weakness.  Psychiatric/Behavioral: The patient is not nervous/anxious.   All other systems reviewed and are negative.     Physical Exam: Physical Exam  Constitutional: She is oriented to person, place, and time.  Chronically ill-appearing.  Not in acute distress.  HENT:  Head: Normocephalic and atraumatic.  Eyes: Pupils are equal, round, and reactive to light. Conjunctivae are normal.  Neck: No JVD present.  Cardiovascular: Intact distal pulses. Tachycardia present.  Murmur heard.  Holosystolic murmur is present with a grade of 3/6 at the lower left sternal border and lower right sternal border. Pulmonary/Chest: Effort normal and breath sounds normal. She has no wheezes. She has no rales.  Abdominal: Soft. Bowel sounds are normal. There is no rebound.  Musculoskeletal:        General: No edema.  Lymphadenopathy:    She has no cervical adenopathy.  Neurological: She is alert and oriented to person, place, and time. No cranial nerve deficit.  Skin: Skin is warm and dry.  Psychiatric: She has a normal mood and affect.  Nursing note and vitals reviewed.    Labs:   Lab Results  Component Value Date   WBC 10.3 05/18/2019   HGB 7.0 (L) 05/18/2019   HCT 22.3 (L) 05/18/2019   MCV 73.8 (L) 05/18/2019   PLT 263 05/18/2019    Recent Labs  Lab 05/18/19 2338 05/18/19 2338 05/19/19 0728  NA 127*  --   --   K 4.7  --   --   CL 97*  --   --   CO2 22  --   --   BUN 13  --   --   CREATININE 0.90   < > 0.87   CALCIUM 8.0*  --   --   PROT 6.3*  --   --   BILITOT 1.3*  --   --   ALKPHOS 243*  --   --   ALT 69*  --   --   AST 149*  --   --   GLUCOSE 125*  --   --    < > = values in this interval not displayed.    Lipid Panel  No results found for: CHOL, TRIG, HDL, CHOLHDL, VLDL, LDLCALC  BNP (last 3 results) No results for input(s):   BNP in the last 8760 hours.  HEMOGLOBIN A1C No results found for: HGBA1C, MPG  Cardiac Panel (last 3 results) No results for input(s): CKTOTAL, CKMB, RELINDX in the last 8760 hours.  Invalid input(s): TROPONINHS  No results found for: CKTOTAL, CKMB, CKMBINDEX   TSH Recent Labs    05/19/19 0728  TSH 2.414      Radiology: [facility] Chest Port 1 View  Result Date: 05/19/2019 CLINICAL DATA:  Sepsis, pain EXAM: PORTABLE CHEST 1 VIEW COMPARISON:  06/14/2014 FINDINGS: Patchy airspace opacities noted in both lungs, right greater than left with somewhat peripheral predilection. No effusions. Heart is normal size. No acute bony abnormality. IMPRESSION: Patchy bilateral airspace disease concerning for pneumonia. Electronically Signed   By: Rolm Baptise M.D.   On: 05/19/2019 00:31   CT Renal Stone Study  Result Date: 05/19/2019 CLINICAL DATA:  Back pain.  History of hepatitis-C.  Kidney stones. EXAM: CT ABDOMEN AND PELVIS WITHOUT CONTRAST TECHNIQUE: Multidetector CT imaging of the abdomen and pelvis was performed following the standard protocol without IV contrast. COMPARISON:  06/13/2017 FINDINGS: Lower chest: Numerous bilateral pulmonary nodules in the visualized lower lungs, many of which are cavitary. Appearance is concerning for septic emboli or atypical infection. Heart is normal size. Small pericardial effusion. No pleural effusions. Hepatobiliary: No focal hepatic abnormality. Gallbladder unremarkable. Pancreas: No focal abnormality or ductal dilatation. Spleen: Spleen is enlarged with a craniocaudal length of the spleen measuring 19.4 cm. Adrenals/Urinary Tract:  No renal or ureteral stones. No hydronephrosis. Stomach/Bowel: Moderate stool burden throughout the colon. Stomach, large and small bowel grossly unremarkable. Vascular/Lymphatic:  No evidence of aneurysm or adenopathy. Reproductive: Uterus and adnexa unremarkable.  No mass. Other: No free fluid or free air. Musculoskeletal: No acute bony abnormality. IMPRESSION: Numerous bilateral cavitary and non cavitary pulmonary nodules. This is concerning for septic emboli or atypical/fungal infection. Splenomegaly. Moderate stool burden throughout the colon. No renal or ureteral stones.  No hydronephrosis. Electronically Signed   By: Rolm Baptise M.D.   On: 05/19/2019 00:45    Scheduled Meds: . sodium chloride   Intravenous Once  . ferrous sulfate  325 mg Oral BID  . methadone  55 mg Oral Daily  . prenatal multivitamin  1 tablet Oral Daily   Continuous Infusions: . sodium chloride 125 mL/hr at 05/19/19 0743  . ceFEPime (MAXIPIME) IV 2 g (05/19/19 0747)  . vancomycin 750 mg (05/19/19 1049)   PRN Meds:.ketorolac, ondansetron **OR** ondansetron (ZOFRAN) IV  CARDIAC STUDIES:  EKG 05/18/2019: Sinus tachycardia 124 bpm. Otherwise normal EKG.  Echocardiogram 05/19/2019: Final report pending. Normal LVEF. Severe TR, no definite evidence of vegetation, but suspicious. Mitral chordae also look suspicious for a vegetation.  Assessment & Recommendations:  29 y.o. Caucasian female  with history hepatitis C, kidney stones, IV drug abuse, admitted with fever, chills, back pain, fatigue.  Presentation concerning for pyelonephritis /- endocarditis. She is acutely ill with borderline hypotension, elevated inflammatory markers, anemia.  I do not see that she has had blood cultures.  Recommend blood cultures, especially if times with her fever spikes.  Presently, she meets 3 minor criteria for endocarditis-including predisposing risk factor, fever, vascular phenomenon with septic renal emboli.  Agree with  presumptively treating her for endocarditis.  She needs TEE for further evaluation of her valves, especially tricuspid and mitral valve.  Coupled with blood cultures, TEE will guide further management options.  Need to exclude large vegetation.  If present, may need to at least consider surgical options, given that she  is already had septic emboli to the kidneys.  Also recommend evaluating for any spinal infectious processes.  We will plan to perform TEE on 05/22/2019 morning.  Recommend repeating Covid test 1 more time before TEE.  Elder Negus, MD 05/19/2019, 11:32 AM [facility] [place] Pager: [phone] Office: [phone] If no answer Cell [phone]

## 2019-05-19 NOTE — Progress Notes (Signed)
PHARMACY - PHYSICIAN COMMUNICATION CRITICAL VALUE ALERT - BLOOD CULTURE IDENTIFICATION (BCID)  Christy Pearson is an 29 y.o. female who presented to [facility] on 05/18/2019 with a chief complaint of Fever/chills and weakness  Assessment:  2/2 blood culture growing MRSA  Name of physician (or Provider) Contacted:  C BOdenheimer  Current antibiotics: Vancomycin and Cefepime   Changes to prescribed antibiotics recommended:   Will d/c Cefepime and continue Vancomycin   Results for orders placed or performed during the hospital encounter of 05/18/19  Blood Culture ID Panel (Reflexed) (Collected: 05/18/2019 10:52 PM)  Result Value Ref Range   Enterococcus species NOT DETECTED NOT DETECTED   Listeria monocytogenes NOT DETECTED NOT DETECTED   Staphylococcus species DETECTED (A) NOT DETECTED   Staphylococcus aureus (BCID) DETECTED (A) NOT DETECTED   Methicillin resistance DETECTED (A) NOT DETECTED   Streptococcus species NOT DETECTED NOT DETECTED   Streptococcus agalactiae NOT DETECTED NOT DETECTED   Streptococcus pneumoniae NOT DETECTED NOT DETECTED   Streptococcus pyogenes NOT DETECTED NOT DETECTED   Acinetobacter baumannii NOT DETECTED NOT DETECTED   Enterobacteriaceae species NOT DETECTED NOT DETECTED   Enterobacter cloacae complex NOT DETECTED NOT DETECTED   Escherichia coli NOT DETECTED NOT DETECTED   Klebsiella oxytoca NOT DETECTED NOT DETECTED   Klebsiella pneumoniae NOT DETECTED NOT DETECTED   Proteus species NOT DETECTED NOT DETECTED   Serratia marcescens NOT DETECTED NOT DETECTED   Haemophilus influenzae NOT DETECTED NOT DETECTED   Neisseria meningitidis NOT DETECTED NOT DETECTED   Pseudomonas aeruginosa NOT DETECTED NOT DETECTED   Candida albicans NOT DETECTED NOT DETECTED   Candida glabrata NOT DETECTED NOT DETECTED   Candida krusei NOT DETECTED NOT DETECTED   Candida parapsilosis NOT DETECTED NOT DETECTED   Candida tropicalis NOT DETECTED NOT DETECTED    Aubrianne, Molyneux 05/19/2019  11:43 PM

## 2019-05-19 NOTE — Progress Notes (Signed)
Patient arrived to [id number] from [facility]. Telemetry applied, CCMD notified. Pt placed on  Droplet, then contact and airborne. Pt's covid neg. Call bell within reach.

## 2019-05-20 DIAGNOSIS — R0989 Other specified symptoms and signs involving the circulatory and respiratory systems: Secondary | ICD-10-CM | POA: Diagnosis not present

## 2019-05-20 LAB — CBC
HCT: 22.4 % — ABNORMAL LOW (ref 36.0–46.0)
Hemoglobin: 7.3 g/dL — ABNORMAL LOW (ref 12.0–15.0)
MCH: 24.3 pg — ABNORMAL LOW (ref 26.0–34.0)
MCHC: 32.6 g/dL (ref 30.0–36.0)
MCV: 74.4 fL — ABNORMAL LOW (ref 80.0–100.0)
Platelets: 193 10*3/uL (ref 150–400)
RBC: 3.01 MIL/uL — ABNORMAL LOW (ref 3.87–5.11)
RDW: 17.3 % — ABNORMAL HIGH (ref 11.5–15.5)
WBC: 2.5 10*3/uL — ABNORMAL LOW (ref 4.0–10.5)
nRBC: 0 % (ref 0.0–0.2)

## 2019-05-20 LAB — COMPREHENSIVE METABOLIC PANEL
ALT: 43 U/L (ref 0–44)
AST: 52 U/L — ABNORMAL HIGH (ref 15–41)
Albumin: 1.7 g/dL — ABNORMAL LOW (ref 3.5–5.0)
Alkaline Phosphatase: 158 U/L — ABNORMAL HIGH (ref 38–126)
Anion gap: 8 (ref 5–15)
BUN: 10 mg/dL (ref 6–20)
CO2: 19 mmol/L — ABNORMAL LOW (ref 22–32)
Calcium: 8 mg/dL — ABNORMAL LOW (ref 8.9–10.3)
Chloride: 108 mmol/L (ref 98–111)
Creatinine, Ser: 0.61 mg/dL (ref 0.44–1.00)
GFR calc Af Amer: >60 mL/min (ref >60)
GFR calc non Af Amer: >60 mL/min (ref >60)
Glucose, Bld: 108 mg/dL — ABNORMAL HIGH (ref 70–99)
Potassium: 4.1 mmol/L (ref 3.5–5.1)
Sodium: 135 mmol/L (ref 135–145)
Total Bilirubin: 0.8 mg/dL (ref 0.3–1.2)
Total Protein: 5.6 g/dL — ABNORMAL LOW (ref 6.5–8.1)

## 2019-05-20 LAB — PROTIME-INR
INR: 1.3 — ABNORMAL HIGH (ref 0.8–1.2)
Prothrombin Time: 16.1 seconds — ABNORMAL HIGH (ref 11.4–15.2)

## 2019-05-20 LAB — HEPATITIS B SURFACE ANTIGEN: Hepatitis B Surface Ag: NONREACTIVE

## 2019-05-20 LAB — PROCALCITONIN: Procalcitonin: 4.65 ng/mL

## 2019-05-20 LAB — CORTISOL-AM, BLOOD: Cortisol - AM: 15.8 ug/dL (ref 6.7–22.6)

## 2019-05-20 MED ORDER — LORAZEPAM 2 MG/ML IJ SOLN
1.0000 mg | Freq: Once | INTRAMUSCULAR | Status: AC
  Administered 2019-05-20: 1 mg via INTRAVENOUS
  Filled 2019-05-20: qty 1

## 2019-05-20 MED ORDER — LACTATED RINGERS IV BOLUS
1000.0000 mL | Freq: Once | INTRAVENOUS | Status: AC
  Administered 2019-05-20: 1000 mL via INTRAVENOUS

## 2019-05-20 MED ORDER — METHADONE HCL 10 MG PO TABS
15.0000 mg | ORAL_TABLET | Freq: Every day | ORAL | Status: DC
  Administered 2019-05-21 – 2019-06-06 (×17): 15 mg via ORAL
  Filled 2019-05-20 (×17): qty 2

## 2019-05-20 MED ORDER — SODIUM CHLORIDE 0.9 % IV SOLN
2.0000 g | INTRAVENOUS | Status: DC
  Administered 2019-05-20 – 2019-05-21 (×2): 2 g via INTRAVENOUS
  Filled 2019-05-20 (×2): qty 2
  Filled 2019-05-20: qty 20

## 2019-05-20 NOTE — Progress Notes (Signed)
Patient's HR stayed in 140-150 s with a respiration of 35-47. Patient is diaphoretic and very agitated. Provider on call notified, new order received.

## 2019-05-20 NOTE — Progress Notes (Signed)
PROGRESS NOTE    Christy Pearson  ZWC:[id number] DOB: 1991/03/18 DOA: 05/18/2019 PCP: Patient, No Pcp Per  Outpatient Specialists:   Brief Narrative:  Patient is a 29 year old Caucasian female with past medical history significant for hepatitis C, heroin abuse and kidney stones.  Patient is currently being treated with methadone.  Current dose of methadone, as per [facility], is 30 mg p.o. once daily.  Patient is sedated.  No significant history from patient.  Also discussed with the patient's nurse.  Tried contacting Dr. Lorin Picket on the phone #[phone] (this may be the physician prescribing patient's methadone), but he went into the voicemail.  Will decrease the dose of methadone to 15 Mg p.o. once daily from tomorrow.  Will consult pharmacy team to assist with confirmation of the dose of methadone.  Patient was admitted with fever and back pain, with concerns for possible endocarditis.  MRI is still pending.  Patient is on vancomycin.  Blood culture done on 05/18/2019 grew Staph aureus.,  Urine culture is growing E. coli.  Final cultures are pending.  Allergy to penicillin is documented, but no details.  We defer antibiotics management to the infectious disease team.  TEE is planned in 2 days.  Assessment & Plan:   Principal Problem:   Suspected endocarditis Active Problems:   Injection of illicit drug within last 12 months   History of hepatitis C, self reported   Sepsis (HCC)   Septic pulmonary embolism (HCC)   Substance use disorder  Severe sepsis:  Blood cultures growing Staph aureus.   Urine culture is growing E. coli.   Patient is currently on treatment for likely endocarditis.   UTI/pyelonephritis remains very possible  Follow final cultures  We defer IV antibiotics management to the ID team.   Further management will depend on hospital course  MRI of the lumbar spine is pending.   UTI: -Urine culture is growing E. coli.   -Discussed with pharmacy team, will start patient  on IV ceftriaxone.  Patient has tolerated cefepime.  However, will defer antibiotics to infectious disease team.  Bilateral pulmonary nodules: Possibly septic emboli.   Continue antibiotics.   TEE in 2 days  Infectious disease also following patient.   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   -Continue to monitor closely.    Transaminitis: History of hepatitis C HIV test is nonreactive. AST is on the downward trend. ALT is within normal range  IV drug abuse: Patient denies (deceased as per prior documentation)  Methadone use: -See above documentation -Decrease dose of methadone  DVT prophylaxis: SCD Code Status: Full code Family Communication:  Disposition Plan: We will follow final cultures.  Continue antibiotics for now.  Follow MRI of the spine.  Will defer antibiotics management to the infectious disease team.  Will contact physician prescribing methadone for patient to confirm dose.   Consultants:   Infectious disease  Cardiology  Procedures:   SCD  Antimicrobials:   IV vancomycin  Will add IV ceftriaxone  Will defer antibiotics management to the infectious disease team.  Subjective: No history from patient.  Patient is heavily sedated.  Objective: Vitals:   05/20/19 0419 05/20/19 0431 05/20/19 0646 05/20/19 1142  BP: (!) 94/49 (!) 94/49  100/69  Pulse: (!) 136 (!) 140 (!) 106 96  Resp: (!) 35 18 (!) 25 13  Temp: 98 F (36.7 C) 98 F (36.7 C)  98.4 F (36.9 C)  TempSrc:    Oral  SpO2: 93% 95% 98% 97%  Weight:  Height:        Intake/Output Summary (Last 24 hours) at 05/20/2019 1254 Last data filed at 05/20/2019 1144 Gross per 24 hour  Intake 2758.34 ml  Output 1700 ml  Net 1058.34 ml   Filed Weights   05/18/19 2228 05/19/19 0400  Weight: 57.7 kg 56.5 kg    Examination:  General exam: Sedated. Respiratory system: Clear to auscultation.  Cardiovascular system: S1 & S2 heard. Gastrointestinal system: Abdomen is  nondistended, soft and nontender. No organomegaly or masses felt. Normal bowel sounds heard. Central nervous system: Patient is sedated, likely secondary to effect of methadone.  Extremities: No leg edema  Data Reviewed: I have personally reviewed following labs and imaging studies  CBC: Recent Labs  Lab 05/18/19 2252 05/19/19 2118 05/20/19 0258  WBC 10.3  --  2.5*  NEUTROABS 8.5*  --   --   HGB 7.0* 7.8* 7.3*  HCT 22.3* 24.4* 22.4*  MCV 73.8*  --  74.4*  PLT 263  --  193   Basic Metabolic Panel: Recent Labs  Lab 05/18/19 2252 05/18/19 2338 05/19/19 0728 05/20/19 0258  NA 128* 127*  --  135  K 4.8 4.7  --  4.1  CL 96* 97*  --  108  CO2 23 22  --  19*  GLUCOSE 125* 125*  --  108*  BUN 14 13  --  10  CREATININE 0.90 0.90 0.87 0.61  CALCIUM 8.2* 8.0*  --  8.0*   GFR: Estimated Creatinine Clearance: 95 mL/min (by C-G formula based on SCr of 0.61 mg/dL). Liver Function Tests: Recent Labs  Lab 05/18/19 2338 05/20/19 0258  AST 149* 52*  ALT 69* 43  ALKPHOS 243* 158*  BILITOT 1.3* 0.8  PROT 6.3* 5.6*  ALBUMIN 1.9* 1.7*   No results for input(s): LIPASE, AMYLASE in the last 168 hours. No results for input(s): AMMONIA in the last 168 hours. Coagulation Profile: Recent Labs  Lab 05/18/19 2338 05/20/19 0258  INR 1.3* 1.3*   Cardiac Enzymes: No results for input(s): CKTOTAL, CKMB, CKMBINDEX, TROPONINI in the last 168 hours. BNP (last 3 results) No results for input(s): PROBNP in the last 8760 hours. HbA1C: No results for input(s): HGBA1C in the last 72 hours. CBG: No results for input(s): GLUCAP in the last 168 hours. Lipid Profile: No results for input(s): CHOL, HDL, LDLCALC, TRIG, CHOLHDL, LDLDIRECT in the last 72 hours. Thyroid Function Tests: Recent Labs    05/19/19 0728  TSH 2.414   Anemia Panel: Recent Labs    05/19/19 1506  VITAMINB12 1,298*  FOLATE 9.2  FERRITIN 142  TIBC 203*  IRON 37  RETICCTPCT 1.0   Urine analysis:    Component  Value Date/Time   COLORURINE YELLOW 05/18/2019 2252   APPEARANCEUR CLOUDY (A) 05/18/2019 2252   LABSPEC 1.020 05/18/2019 2252   PHURINE 5.5 05/18/2019 2252   GLUCOSEU NEGATIVE 05/18/2019 2252   HGBUR NEGATIVE 05/18/2019 2252   BILIRUBINUR MODERATE (A) 05/18/2019 2252   KETONESUR NEGATIVE 05/18/2019 2252   PROTEINUR 30 (A) 05/18/2019 2252   UROBILINOGEN 0.2 10/28/2015 1453   NITRITE POSITIVE (A) 05/18/2019 2252   LEUKOCYTESUR TRACE (A) 05/18/2019 2252   Sepsis Labs: @LABRCNTIP (procalcitonin:4,lacticidven:4)  ) Recent Results (from the past 240 hour(s))  Blood Culture (routine x 2)     Status: Abnormal (Preliminary result)   Collection Time: 05/18/19 10:52 PM   Specimen: BLOOD  Result Value Ref Range Status   Specimen Description   Final    BLOOD BLOOD LEFT ARM  Performed at [facility], [address] [place], [place] [address]    Special Requests   Final    BOTTLES DRAWN AEROBIC AND ANAEROBIC Blood Culture adequate volume Performed at [facility], [address] [place], [place] [address]    Culture  Setup Time   Final    IN BOTH AEROBIC AND ANAEROBIC BOTTLES GRAM POSITIVE COCCI CRITICAL RESULT CALLED TO, READ BACK BY AND VERIFIED WITH: Evelena Peat [facility] 05/19/19 2254 JDW Performed at [facility], [address], [place], [place] [address]    Culture STAPHYLOCOCCUS AUREUS (A)  Final   Report Status PENDING  Incomplete  Blood Culture ID Panel (Reflexed)     Status: Abnormal   Collection Time: 05/18/19 10:52 PM  Result Value Ref Range Status   Enterococcus species NOT DETECTED NOT DETECTED Final   Listeria monocytogenes NOT DETECTED NOT DETECTED Final   Staphylococcus species DETECTED (A) NOT DETECTED Final    Comment: CRITICAL RESULT CALLED TO, READ BACK BY AND VERIFIED WITH: G ABBOTT PHARMD 05/19/19 2254 JDW    Staphylococcus aureus (BCID) DETECTED (A) NOT DETECTED Final    Comment: Methicillin (oxacillin)-resistant Staphylococcus aureus  (MRSA). MRSA is predictably resistant to beta-lactam antibiotics (except ceftaroline). Preferred therapy is vancomycin unless clinically contraindicated. Patient requires contact precautions if  hospitalized. CRITICAL RESULT CALLED TO, READ BACK BY AND VERIFIED WITH: G ABBOTT PHARMD 05/19/19 2254 JDW    Methicillin resistance DETECTED (A) NOT DETECTED Final    Comment: CRITICAL RESULT CALLED TO, READ BACK BY AND VERIFIED WITH: G ABBOTT PHARMD 05/19/19 2254 JDW    Streptococcus species NOT DETECTED NOT DETECTED Final   Streptococcus agalactiae NOT DETECTED NOT DETECTED Final   Streptococcus pneumoniae NOT DETECTED NOT DETECTED Final   Streptococcus pyogenes NOT DETECTED NOT DETECTED Final   Acinetobacter baumannii NOT DETECTED NOT DETECTED Final   Enterobacteriaceae species NOT DETECTED NOT DETECTED Final   Enterobacter cloacae complex NOT DETECTED NOT DETECTED Final   Escherichia coli NOT DETECTED NOT DETECTED Final   Klebsiella oxytoca NOT DETECTED NOT DETECTED Final   Klebsiella pneumoniae NOT DETECTED NOT DETECTED Final   Proteus species NOT DETECTED NOT DETECTED Final   Serratia marcescens NOT DETECTED NOT DETECTED Final   Haemophilus influenzae NOT DETECTED NOT DETECTED Final   Neisseria meningitidis NOT DETECTED NOT DETECTED Final   Pseudomonas aeruginosa NOT DETECTED NOT DETECTED Final   Candida albicans NOT DETECTED NOT DETECTED Final   Candida glabrata NOT DETECTED NOT DETECTED Final   Candida krusei NOT DETECTED NOT DETECTED Final   Candida parapsilosis NOT DETECTED NOT DETECTED Final   Candida tropicalis NOT DETECTED NOT DETECTED Final    Comment: Performed at [facility], [address], [place], [place] [address]  Urine culture     Status: Abnormal (Preliminary result)   Collection Time: 05/18/19 11:38 PM   Specimen: In/Out Cath Urine  Result Value Ref Range Status   Specimen Description   Final    IN/OUT CATH URINE Performed at [facility], [address] [place], [place] [address]    Special Requests   Final    NONE Performed at [facility], [address] [place], [place] [address]    Culture (A)  Final    >=100,000 COLONIES/mL ESCHERICHIA COLI SUSCEPTIBILITIES TO FOLLOW Performed at [facility], [address], [place], [place] [address]    Report Status PENDING  Incomplete  SARS  CORONAVIRUS 2 (TAT 6-24 HRS) Nasopharyngeal In/Out Cath Urine     Status: None   Collection Time: 05/18/19 11:39 PM   Specimen: In/Out Cath Urine; Nasopharyngeal  Result Value Ref Range Status   SARS Coronavirus 2 NEGATIVE NEGATIVE Final    Comment: (NOTE) SARS-CoV-2 target nucleic acids are NOT DETECTED. The SARS-CoV-2 RNA is generally detectable in upper and lower respiratory specimens during the acute phase of infection. Negative results do not preclude SARS-CoV-2 infection, do not rule out co-infections with other pathogens, and should not be used as the sole basis for treatment or other patient management decisions. Negative results must be combined with clinical observations, patient history, and epidemiological information. The expected result is Negative. Fact Sheet for Patients: HairSlick.nohttps://www.fda.gov/media/138098/download Fact Sheet for Healthcare Providers: quierodirigir.comhttps://www.fda.gov/media/138095/download This test is not yet approved or cleared by the [place] FDA and  has been authorized for detection and/or diagnosis of SARS-CoV-2 by FDA under an Emergency Use Authorization (EUA). This EUA will remain  in effect (meaning this test can be used) for the duration of the COVID-19 declaration under Section 56 4(b)(1) of the Act, 21 U.S.C. section 360bbb-3(b)(1), unless the authorization is terminated or revoked sooner. Performed at [facility], [address] [place], [place] [address]   SARS Coronavirus 2 Ag (30 min TAT) - In/Out Cath Urine     Status: None   Collection Time: 05/18/19 11:40 PM    Specimen: In/Out Cath Urine; Nasal Swab  Result Value Ref Range Status   SARS Coronavirus 2 Ag NEGATIVE NEGATIVE Final    Comment: (NOTE) SARS-CoV-2 antigen NOT DETECTED.  Negative results are presumptive.  Negative results do not preclude SARS-CoV-2 infection and should not be used as the sole basis for treatment or other patient management decisions, including infection  control decisions, particularly in the presence of clinical signs and  symptoms consistent with COVID-19, or in those who have been in contact with the virus.  Negative results must be combined with clinical observations, patient history, and epidemiological information. The expected result is Negative. Fact Sheet for Patients: https://sanders-williams.net/https://www.fda.gov/media/139754/download Fact Sheet for Healthcare Providers: https://martinez.com/https://www.fda.gov/media/139753/download This test is not yet approved or cleared by the [place] FDA and  has been authorized for detection and/or diagnosis of SARS-CoV-2 by FDA under an Emergency Use Authorization (EUA).  This EUA will remain in effect (meaning this test can be used) for the duration of  the COVID-19 de claration under Section 564(b)(1) of the Act, 21 U.S.C. section 360bbb-3(b)(1), unless the authorization is terminated or revoked sooner. Performed at [facility], [address] [place], [place] [address]   Blood Culture (routine x 2)     Status: Abnormal (Preliminary result)   Collection Time: 05/18/19 11:50 PM   Specimen: BLOOD  Result Value Ref Range Status   Specimen Description   Final    BLOOD BLOOD RIGHT ARM Performed at [facility], [address] [place], [place] [address]    Special Requests   Final    BOTTLES DRAWN AEROBIC AND ANAEROBIC Blood Culture adequate volume Performed at [facility], [address] [place], [place] [address]    Culture  Setup Time   Final    IN BOTH AEROBIC AND ANAEROBIC BOTTLES GRAM POSITIVE COCCI CRITICAL  RESULT CALLED TO, READ BACK BY AND VERIFIED WITH: Evelena PeatG ABBOTT [facility] 05/19/19 2254 JDW Performed at [facility], [address] [place], [place] [address]    Culture  STAPHYLOCOCCUS AUREUS (A)  Final   Report Status PENDING  Incomplete  Respiratory Panel by PCR     Status: None   Collection Time: 05/19/19  5:58 AM   Specimen: Nasopharyngeal Swab; Respiratory  Result Value Ref Range Status   Adenovirus NOT DETECTED NOT DETECTED Final   Coronavirus 229E NOT DETECTED NOT DETECTED Final    Comment: (NOTE) The Coronavirus on the Respiratory Panel, DOES NOT test for the novel  Coronavirus (2019 nCoV)    Coronavirus HKU1 NOT DETECTED NOT DETECTED Final   Coronavirus NL63 NOT DETECTED NOT DETECTED Final   Coronavirus OC43 NOT DETECTED NOT DETECTED Final   Metapneumovirus NOT DETECTED NOT DETECTED Final   Rhinovirus / Enterovirus NOT DETECTED NOT DETECTED Final   Influenza A NOT DETECTED NOT DETECTED Final   Influenza B NOT DETECTED NOT DETECTED Final   Parainfluenza Virus 1 NOT DETECTED NOT DETECTED Final   Parainfluenza Virus 2 NOT DETECTED NOT DETECTED Final   Parainfluenza Virus 3 NOT DETECTED NOT DETECTED Final   Parainfluenza Virus 4 NOT DETECTED NOT DETECTED Final   Respiratory Syncytial Virus NOT DETECTED NOT DETECTED Final   Bordetella pertussis NOT DETECTED NOT DETECTED Final   Chlamydophila pneumoniae NOT DETECTED NOT DETECTED Final   Mycoplasma pneumoniae NOT DETECTED NOT DETECTED Final    Comment: Performed at [facility], [address], [place], [place] [address]         Radiology Studies: DG Chest Port 1 View  Result Date: 05/19/2019 CLINICAL DATA:  Sepsis, pain EXAM: PORTABLE CHEST 1 VIEW COMPARISON:  06/14/2014 FINDINGS: Patchy airspace opacities noted in both lungs, right greater than left with somewhat peripheral predilection. No effusions. Heart is normal size. No acute bony abnormality. IMPRESSION: Patchy bilateral airspace disease concerning for  pneumonia. Electronically Signed   By: Charlett Nose M.D.   On: 05/19/2019 00:31   ECHOCARDIOGRAM COMPLETE  Result Date: 05/19/2019   ECHOCARDIOGRAM REPORT   Patient Name:   Christy Pearson Date of Exam: 05/19/2019 Medical Rec #:  [id number]       Height:       67.0 in Accession #:    [id number]      Weight:       124.6 lb Date of Birth:  [birth year]       BSA:          1.65 m Patient Age:    28 years        BP:           82/57 mmHg Patient Gender: F               HR:           85 bpm. Exam Location:  Inpatient Procedure: 2D Echo Indications:     Bacteremia 790.7 / R78.81                  Endocarditis I38  History:         Patient has no prior history of Echocardiogram examinations.                  Risk Factors:Current Smoker. IV drug use. Sepsis.  Sonographer:     Ross Ludwig RDCS (AE) Referring Phys:  [id number] [facility] G CRISTESCU Diagnosing Phys: Thurmon Fair MD IMPRESSIONS  1. Left ventricular ejection fraction, by visual estimation, is 60 to 65%. The left ventricle has normal function. There is no left ventricular hypertrophy.  2. The left ventricle has no regional wall motion abnormalities.  3. Global right ventricle has normal systolic function.The right ventricular size is normal. No increase in right ventricular wall thickness.  4. Left atrial size was normal.  5. Right atrial size was normal.  6. The mitral valve is normal in structure. Mild mitral valve regurgitation. No evidence of mitral stenosis.  7. The tricuspid valve is abnormal. Tricuspid valve regurgitation is moderate-severe.  8. The anterior tricuspid leaflet appears markedly thickened and is highly suspicious for endocarditis, although a distinct vegetation is not identified.  9. The aortic valve is normal in structure. Aortic valve regurgitation is not visualized. No evidence of aortic valve sclerosis or stenosis. 10. The pulmonic valve was normal in structure. Pulmonic valve regurgitation is not visualized. 11. Mildly elevated pulmonary artery  systolic pressure. 12. The inferior vena cava is dilated in size with >50% respiratory variability, suggesting right atrial pressure of 8 mmHg. 13. Small pericardial effusion. 14. The pericardial effusion is posterior to the left ventricle. FINDINGS  Left Ventricle: Left ventricular ejection fraction, by visual estimation, is 60 to 65%. The left ventricle has normal function. The left ventricle has no regional wall motion abnormalities. There is no left ventricular hypertrophy. Normal left atrial pressure. Right Ventricle: The right ventricular size is normal. No increase in right ventricular wall thickness. Global RV systolic function is has normal systolic function. The tricuspid regurgitant velocity is 2.72 m/s, and with an assumed right atrial pressure  of 8 mmHg, the estimated right ventricular systolic pressure is mildly elevated at 37.6 mmHg. Left Atrium: Left atrial size was normal in size. Right Atrium: Right atrial size was normal in size Pericardium: A small pericardial effusion is present. The pericardial effusion is posterior to the left ventricle. There is no evidence of cardiac tamponade. Mitral Valve: The mitral valve is normal in structure. There is mild thickening of the mitral valve leaflet(s). Mild mitral valve regurgitation. No evidence of mitral valve stenosis by observation. MV peak gradient, 5.3 mmHg. Tricuspid Valve: The tricuspid valve is abnormal. Tricuspid valve regurgitation is moderate-severe. The anterior tricuspid leaflet appears markedly thickened and is highly suspicious for endocarditis, although a distinct vegetation is not identified. Aortic Valve: The aortic valve is normal in structure. Aortic valve regurgitation is not visualized. The aortic valve is structurally normal, with no evidence of sclerosis or stenosis. Pulmonic Valve: The pulmonic valve was normal in structure. Pulmonic valve regurgitation is not visualized. Pulmonic regurgitation is not visualized. Aorta: The  aortic root, ascending aorta and aortic arch are all structurally normal, with no evidence of dilitation or obstruction. Venous: The inferior vena cava is dilated in size with greater than 50% respiratory variability, suggesting right atrial pressure of 8 mmHg. IAS/Shunts: No atrial level shunt detected by color flow Doppler. There is no evidence of a patent foramen ovale. No ventricular septal defect is seen or detected. There is no evidence of an atrial septal defect. Additional Comments: The study is highly suspicious for tricuspid valve endocarditis, although a discrete vegetation is not identified. Consider TEE if the findings would alter the management plan.  LEFT VENTRICLE PLAX 2D LVIDd:         4.24 cm  Diastology LVIDs:         3.16 cm  LV e' lateral: 18.40 cm/s LV PW:         1.15 cm  LV e' medial:  13.20 cm/s LV IVS:        1.20 cm LVOT diam:     2.00 cm LV SV:  41 ml LV SV Index:   24.96 LVOT Area:     3.14 cm  RIGHT VENTRICLE             IVC RV Basal diam:  2.99 cm     IVC diam: 2.05 cm RV S prime:     19.50 cm/s TAPSE (M-mode): 2.6 cm LEFT ATRIUM             Index       RIGHT ATRIUM           Index LA diam:        3.10 cm 1.87 cm/m  RA Area:     17.10 cm LA Vol (A2C):   36.6 ml 22.13 ml/m RA Volume:   49.90 ml  30.18 ml/m LA Vol (A4C):   32.4 ml 19.59 ml/m LA Biplane Vol: 37.4 ml 22.62 ml/m   AORTA Ao Root diam: 3.00 cm MITRAL VALVE            TRICUSPID VALVE MV Peak grad: 5.3 mmHg  TR Peak grad:   29.6 mmHg MV Mean grad: 2.0 mmHg  TR Vmax:        280.00 cm/s MV Vmax:      1.15 m/s MV Vmean:     65.7 cm/s SHUNTS MV VTI:       0.24 m    Systemic Diam: 2.00 cm  Dani Gobble Croitoru MD Electronically signed by Sanda Klein MD Signature Date/Time: 05/19/2019/11:00:28 AM    Final (Updated)    CT Renal Stone Study  Result Date: 05/19/2019 CLINICAL DATA:  Back pain.  History of hepatitis-C.  Kidney stones. EXAM: CT ABDOMEN AND PELVIS WITHOUT CONTRAST TECHNIQUE: Multidetector CT imaging of the  abdomen and pelvis was performed following the standard protocol without IV contrast. COMPARISON:  06/13/2017 FINDINGS: Lower chest: Numerous bilateral pulmonary nodules in the visualized lower lungs, many of which are cavitary. Appearance is concerning for septic emboli or atypical infection. Heart is normal size. Small pericardial effusion. No pleural effusions. Hepatobiliary: No focal hepatic abnormality. Gallbladder unremarkable. Pancreas: No focal abnormality or ductal dilatation. Spleen: Spleen is enlarged with a craniocaudal length of the spleen measuring 19.4 cm. Adrenals/Urinary Tract: No renal or ureteral stones. No hydronephrosis. Stomach/Bowel: Moderate stool burden throughout the colon. Stomach, large and small bowel grossly unremarkable. Vascular/Lymphatic:  No evidence of aneurysm or adenopathy. Reproductive: Uterus and adnexa unremarkable.  No mass. Other: No free fluid or free air. Musculoskeletal: No acute bony abnormality. IMPRESSION: Numerous bilateral cavitary and non cavitary pulmonary nodules. This is concerning for septic emboli or atypical/fungal infection. Splenomegaly. Moderate stool burden throughout the colon. No renal or ureteral stones.  No hydronephrosis. Electronically Signed   By: Rolm Baptise M.D.   On: 05/19/2019 00:45   US Abdomen Limited RUQ  Result Date: 05/19/2019 CLINICAL DATA:  Elevated LFTs EXAM: ULTRASOUND ABDOMEN LIMITED RIGHT UPPER QUADRANT COMPARISON:  CT 05/19/2019 FINDINGS: Gallbladder: No shadowing stones. Wall thickness upper normal. Negative sonographic Murphy. Common bile duct: Diameter: 3.1 mm Liver: No focal lesion identified. Within normal limits in parenchymal echogenicity. Portal vein is patent on color Doppler imaging with normal direction of blood flow towards the liver. Other: None. IMPRESSION: 1. Negative for gallstones or acute gallbladder disease. 2. Ultrasound appearance of the liver is within normal limits. Electronically Signed   By: Donavan Foil M.D.   On: 05/19/2019 22:18        Scheduled Meds:  ferrous sulfate  325 mg Oral BID   [START ON 05/21/2019]  methadone  15 mg Oral Daily   prenatal multivitamin  1 tablet Oral Daily   Continuous Infusions:  sodium chloride 125 mL/hr at 05/20/19 0913   vancomycin 750 mg (05/20/19 0914)     LOS: 1 day    Time spent: 35 minutes    Berton Mount, MD  [facility] Pager #: [phone] 7PM-7AM contact night coverage as above

## 2019-05-20 NOTE — Progress Notes (Signed)
Pt refusing to let lab staff draw vancomycin trough at this time. MD made aware.

## 2019-05-21 DIAGNOSIS — R0989 Other specified symptoms and signs involving the circulatory and respiratory systems: Secondary | ICD-10-CM | POA: Diagnosis not present

## 2019-05-21 LAB — URINE CULTURE: Culture: >=100000 — AB

## 2019-05-21 LAB — CULTURE, BLOOD (ROUTINE X 2)
Special Requests: ADEQUATE
Special Requests: ADEQUATE

## 2019-05-21 LAB — PHOSPHORUS: Phosphorus: 3.3 mg/dL (ref 2.5–4.6)

## 2019-05-21 LAB — COMPREHENSIVE METABOLIC PANEL
ALT: 36 U/L (ref 0–44)
AST: 49 U/L — ABNORMAL HIGH (ref 15–41)
Albumin: 1.5 g/dL — ABNORMAL LOW (ref 3.5–5.0)
Alkaline Phosphatase: 130 U/L — ABNORMAL HIGH (ref 38–126)
Anion gap: 7 (ref 5–15)
BUN: 7 mg/dL (ref 6–20)
CO2: 20 mmol/L — ABNORMAL LOW (ref 22–32)
Calcium: 7.7 mg/dL — ABNORMAL LOW (ref 8.9–10.3)
Chloride: 104 mmol/L (ref 98–111)
Creatinine, Ser: 0.62 mg/dL (ref 0.44–1.00)
GFR calc Af Amer: >60 mL/min (ref >60)
GFR calc non Af Amer: >60 mL/min (ref >60)
Glucose, Bld: 120 mg/dL — ABNORMAL HIGH (ref 70–99)
Potassium: 3.6 mmol/L (ref 3.5–5.1)
Sodium: 131 mmol/L — ABNORMAL LOW (ref 135–145)
Total Bilirubin: 0.8 mg/dL (ref 0.3–1.2)
Total Protein: 5.4 g/dL — ABNORMAL LOW (ref 6.5–8.1)

## 2019-05-21 LAB — CBC WITH DIFFERENTIAL/PLATELET
Abs Immature Granulocytes: 0.07 10*3/uL (ref 0.00–0.07)
Basophils Absolute: 0 10*3/uL (ref 0.0–0.1)
Basophils Relative: 0 %
Eosinophils Absolute: 0 10*3/uL (ref 0.0–0.5)
Eosinophils Relative: 0 %
HCT: 19.7 % — ABNORMAL LOW (ref 36.0–46.0)
Hemoglobin: 6.6 g/dL — CL (ref 12.0–15.0)
Immature Granulocytes: 1 %
Lymphocytes Relative: 16 %
Lymphs Abs: 1.3 10*3/uL (ref 0.7–4.0)
MCH: 24.5 pg — ABNORMAL LOW (ref 26.0–34.0)
MCHC: 33.5 g/dL (ref 30.0–36.0)
MCV: 73.2 fL — ABNORMAL LOW (ref 80.0–100.0)
Monocytes Absolute: 0.6 10*3/uL (ref 0.1–1.0)
Monocytes Relative: 8 %
Neutro Abs: 6 10*3/uL (ref 1.7–7.7)
Neutrophils Relative %: 75 %
Platelets: 282 10*3/uL (ref 150–400)
RBC: 2.69 MIL/uL — ABNORMAL LOW (ref 3.87–5.11)
RDW: 17.5 % — ABNORMAL HIGH (ref 11.5–15.5)
WBC: 8 10*3/uL (ref 4.0–10.5)
nRBC: 0 % (ref 0.0–0.2)

## 2019-05-21 LAB — CBC
HCT: 22.1 % — ABNORMAL LOW (ref 36.0–46.0)
Hemoglobin: 7.2 g/dL — ABNORMAL LOW (ref 12.0–15.0)
MCH: 25 pg — ABNORMAL LOW (ref 26.0–34.0)
MCHC: 32.6 g/dL (ref 30.0–36.0)
MCV: 76.7 fL — ABNORMAL LOW (ref 80.0–100.0)
Platelets: 299 10*3/uL (ref 150–400)
RBC: 2.88 MIL/uL — ABNORMAL LOW (ref 3.87–5.11)
RDW: 18 % — ABNORMAL HIGH (ref 11.5–15.5)
WBC: 10.5 10*3/uL (ref 4.0–10.5)
nRBC: 0 % (ref 0.0–0.2)

## 2019-05-21 LAB — MAGNESIUM: Magnesium: 1.4 mg/dL — ABNORMAL LOW (ref 1.7–2.4)

## 2019-05-21 LAB — VANCOMYCIN, PEAK: Vancomycin Pk: 21 ug/mL — ABNORMAL LOW (ref 30–40)

## 2019-05-21 LAB — HCV RNA QUANT: HCV Quantitative: NOT DETECTED IU/mL (ref >50)

## 2019-05-21 LAB — VANCOMYCIN, TROUGH: Vancomycin Tr: 9 ug/mL — ABNORMAL LOW (ref 15–20)

## 2019-05-21 LAB — PREPARE RBC (CROSSMATCH)

## 2019-05-21 MED ORDER — SODIUM CHLORIDE 0.9% IV SOLUTION: Freq: Once | INTRAVENOUS | Status: AC

## 2019-05-21 MED ORDER — POTASSIUM CHLORIDE CRYS ER 20 MEQ PO TBCR
40.0000 meq | EXTENDED_RELEASE_TABLET | Freq: Once | ORAL | Status: AC
  Administered 2019-05-21: 40 meq via ORAL
  Filled 2019-05-21: qty 2

## 2019-05-21 MED ORDER — MAGNESIUM SULFATE 2 GM/50ML IV SOLN
2.0000 g | Freq: Once | INTRAVENOUS | Status: AC
  Administered 2019-05-21: 2 g via INTRAVENOUS
  Filled 2019-05-21: qty 50

## 2019-05-21 MED ORDER — VANCOMYCIN HCL IN DEXTROSE 1-5 GM/200ML-% IV SOLN
1000.0000 mg | Freq: Two times a day (BID) | INTRAVENOUS | Status: DC
  Administered 2019-05-21 – 2019-05-30 (×17): 1000 mg via INTRAVENOUS
  Filled 2019-05-21 (×20): qty 200

## 2019-05-21 NOTE — Progress Notes (Signed)
Pharmacy Antibiotic Note  Christy Pearson is a 29 y.o. female admitted on 05/18/2019 with MRSA Bacteremia and possible endocarditis. Pharmacy has been consulted for Vancomycin dosing.  Vancomycin levels drawn today: peak level 21, trough level 9, calculated AUC 366.4. Patient-specific kinetics: Vd 39.7 L, Ke 0.1031, half-life 6.7 hrs.  Patient spiking a fever of 103.1, wbc wnl. Renal function stable. Given low calculated AUC, will adjust vancomycin dose to 1000 mg IV Q12 hrs which gives an estimated AUC of 488.1.  Plan: Increase Vancomycin to 1000 mg IV Q12 hrs Monitor renal function, cultures, and clinical progression Check Vancomycin levels at steady state F/u Monday TEE results   Height: 5\' 7"  (170.2 cm) Weight: 124 lb 9 oz (56.5 kg) IBW/kg (Calculated) : 61.6  Temp (24hrs), Avg:99.7 F (37.6 C), Min:97.8 F (36.6 C), Max:103.1 F (39.5 C)  Recent Labs  Lab 05/18/19 2252 05/18/19 2338 05/19/19 0126 05/19/19 0728 05/20/19 0258 05/21/19 0049 05/21/19 0902  WBC 10.3  --   --   --  2.5* 8.0  --   CREATININE 0.90 0.90  --  0.87 0.61 0.62  --   LATICACIDVEN 2.4*  --  1.7  --   --   --   --   VANCOTROUGH  --   --   --   --   --   --  9*  VANCOPEAK  --   --   --   --   --  21*  --     Estimated Creatinine Clearance: 95 mL/min (by C-G formula based on SCr of 0.62 mg/dL).    Allergies  Allergen Reactions  . Tylenol [Acetaminophen] Rash    Pt just reported tylenol allergy at this visit at 2338pm  . Penicillins     Antimicrobials this admission: Vancomycin 1/29 >>  CTX 1/30 >> (2/1) Cefepime 1/29 x3 doses  Dose adjustments this admission: 1/31: VP 21, VT 9, AUC 366 - increase dose to 1000 mg Q12 hrs  Microbiology results: 1/28 BCx: MRSA 1/28 UCx: Ecoli (UA contaminated)  1/29 BCx: ngtd   2/29, PharmD PGY1 Pharmacy Resident Phone: [phone] 05/21/2019  9:54 AM  Please check [email or web address] for unit-specific pharmacy phone numbers.
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
## 2019-05-21 NOTE — Progress Notes (Signed)
PROGRESS NOTE    Christy Pearson  JXB:[id number] DOB: 1990/07/08 DOA: 05/18/2019 PCP: Patient, No Pcp Per  Outpatient Specialists:   Brief Narrative:  Patient is a 29 year old Caucasian female with past medical history significant for hepatitis C, heroin abuse and kidney stones.  Patient has been getting methadone from the street.  According to the patient, she takes about 14 mg of methadone daily from the street.  On admission, patient was initially started on methadone 30 mg p.o. once daily, but patient became very sedated.  Patient is currently on methadone 15 mg p.o. once daily.  Patient is more awake and alert today.  Patient's history remains to be confirmed vis--vis methadone use.  Patient was admitted with fever and back pain, with concerns for possible endocarditis.  MRI is still pending.  Patient is on vancomycin.  Blood culture done on 05/18/2019 grew MRSA.  Urine culture has grown E. coli.  Patient is also on IV ceftriaxone.  Infectious disease team is directing patient's care.  TEE is planned for tomorrow.  Patient continues to have fevers.   Assessment & Plan:   Principal Problem:   Suspected endocarditis Active Problems:   Injection of illicit drug within last 12 months   History of hepatitis C, self reported   Sepsis (HCC)   Septic pulmonary embolism (HCC)   Substance use disorder  Severe sepsis/infective endocarditis/UTI:  Blood cultures have grown MRSA.    Urine culture has grown E. coli.   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   TEE is planned for tomorrow Patient is currently on IV vancomycin and ceftriaxone Infectious disease team is directing care Further management will depend on hospital course  MRI of the lumbar spine is pending.   UTI/possible sepsis: -Urine culture has grown E. coli.  -Continue IV ceftriaxone.  -Input from the infectious disease team is highly appreciated.    Bilateral pulmonary nodules: Possibly septic emboli.     Continue antibiotics.    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   -Continue to monitor closely.    Transaminitis: History of hepatitis C HIV test is nonreactive. AST is on the downward trend. ALT is within normal range  IV drug abuse: Patient denies (as per prior documentation)  Methadone use: -See above documentation -Patient is currently on methadone 15 Mg p.o. once daily. -Patient's methadone use history needs to be collaborated.  DVT prophylaxis: SCD Code Status: Full code Family Communication:  Disposition Plan: Patient remains quite ill.  Further management will depend on hospital course Consultants:   Infectious disease  Cardiology  Procedures:   SCD  Antimicrobials:   IV vancomycin  IV ceftriaxone  Subjective: Patient continues to report fever and back pain.  Objective: Vitals:   05/21/19 0730 05/21/19 1014 05/21/19 1030 05/21/19 1300  BP: 109/71     Pulse: (!) 109   77  Resp: (!) 23 15 18 18   Temp: (!) 103.1 F (39.5 C) (!) 101.4 F (38.6 C)  98 F (36.7 C)  TempSrc: Oral Oral  Oral  SpO2: 96% 96% 97% 97%  Weight:      Height:        Intake/Output Summary (Last 24 hours) at 05/21/2019 1522 Last data filed at 05/21/2019 1520 Gross per 24 hour  Intake 3626.84 ml  Output 3050 ml  Net 576.84 ml   Filed Weights   05/18/19 2228 05/19/19 0400  Weight: 57.7 kg 56.5 kg    Examination:  General exam: Awake and alert. Respiratory system:  Clear to auscultation.  Cardiovascular system: S1 & S2 heard. Gastrointestinal system: Abdomen is nondistended, soft and nontender. No organomegaly or masses felt. Normal bowel sounds heard. Central nervous system: Patient is sedated, likely secondary to effect of methadone.  Extremities: No leg edema  Data Reviewed: I have personally reviewed following labs and imaging studies  CBC: Recent Labs  Lab 05/18/19 2252 05/19/19 2118 05/20/19 0258 05/21/19 0049 05/21/19 1035  WBC 10.3  --   2.5* 8.0 10.5  NEUTROABS 8.5*  --   --  6.0  --   HGB 7.0* 7.8* 7.3* 6.6* 7.2*  HCT 22.3* 24.4* 22.4* 19.7* 22.1*  MCV 73.8*  --  74.4* 73.2* 76.7*  PLT 263  --  193 282 710   Basic Metabolic Panel: Recent Labs  Lab 05/18/19 2252 05/18/19 2338 05/19/19 0728 05/20/19 0258 05/21/19 0049  NA 128* 127*  --  135 131*  K 4.8 4.7  --  4.1 3.6  CL 96* 97*  --  108 104  CO2 23 22  --  19* 20*  GLUCOSE 125* 125*  --  108* 120*  BUN 14 13  --  10 7  CREATININE 0.90 0.90 0.87 0.61 0.62  CALCIUM 8.2* 8.0*  --  8.0* 7.7*  MG  --   --   --   --  1.4*  PHOS  --   --   --   --  3.3   GFR: Estimated Creatinine Clearance: 95 mL/min (by C-G formula based on SCr of 0.62 mg/dL). Liver Function Tests: Recent Labs  Lab 05/18/19 2338 05/20/19 0258 05/21/19 0049  AST 149* 52* 49*  ALT 69* 43 36  ALKPHOS 243* 158* 130*  BILITOT 1.3* 0.8 0.8  PROT 6.3* 5.6* 5.4*  ALBUMIN 1.9* 1.7* 1.5*   No results for input(s): LIPASE, AMYLASE in the last 168 hours. No results for input(s): AMMONIA in the last 168 hours. Coagulation Profile: Recent Labs  Lab 05/18/19 2338 05/20/19 0258  INR 1.3* 1.3*   Cardiac Enzymes: No results for input(s): CKTOTAL, CKMB, CKMBINDEX, TROPONINI in the last 168 hours. BNP (last 3 results) No results for input(s): PROBNP in the last 8760 hours. HbA1C: No results for input(s): HGBA1C in the last 72 hours. CBG: No results for input(s): GLUCAP in the last 168 hours. Lipid Profile: No results for input(s): CHOL, HDL, LDLCALC, TRIG, CHOLHDL, LDLDIRECT in the last 72 hours. Thyroid Function Tests: Recent Labs    05/19/19 0728  TSH 2.414   Anemia Panel: Recent Labs    05/19/19 1506  VITAMINB12 1,298*  FOLATE 9.2  FERRITIN 142  TIBC 203*  IRON 37  RETICCTPCT 1.0   Urine analysis:    Component Value Date/Time   COLORURINE YELLOW 05/18/2019 2252   APPEARANCEUR CLOUDY (A) 05/18/2019 2252   LABSPEC 1.020 05/18/2019 2252   PHURINE 5.5 05/18/2019 2252    GLUCOSEU NEGATIVE 05/18/2019 2252   HGBUR NEGATIVE 05/18/2019 2252   BILIRUBINUR MODERATE (A) 05/18/2019 2252   KETONESUR NEGATIVE 05/18/2019 2252   PROTEINUR 30 (A) 05/18/2019 2252   UROBILINOGEN 0.2 10/28/2015 1453   NITRITE POSITIVE (A) 05/18/2019 2252   LEUKOCYTESUR TRACE (A) 05/18/2019 2252   Sepsis Labs: @LABRCNTIP (procalcitonin:4,lacticidven:4)  ) Recent Results (from the past 240 hour(s))  Blood Culture (routine x 2)     Status: Abnormal   Collection Time: 05/18/19 10:52 PM   Specimen: BLOOD  Result Value Ref Range Status   Specimen Description   Final    BLOOD BLOOD LEFT ARM  Performed at [facility], [address] [place], [place] [address]    Special Requests   Final    BOTTLES DRAWN AEROBIC AND ANAEROBIC Blood Culture adequate volume Performed at [facility], [address] [place], [place] [address]    Culture  Setup Time   Final    IN BOTH AEROBIC AND ANAEROBIC BOTTLES GRAM POSITIVE COCCI CRITICAL RESULT CALLED TO, READ BACK BY AND VERIFIED WITH: Evelena Peat [facility] 05/19/19 2254 JDW Performed at [facility], [address] [place], [place] [address]    Culture METHICILLIN RESISTANT STAPHYLOCOCCUS AUREUS (A)  Final   Report Status 05/21/2019 FINAL  Final   Organism ID, Bacteria METHICILLIN RESISTANT STAPHYLOCOCCUS AUREUS  Final      Susceptibility   Methicillin resistant staphylococcus aureus - MIC*    CIPROFLOXACIN <=0.5 SENSITIVE Sensitive     ERYTHROMYCIN >=8 RESISTANT Resistant     GENTAMICIN <=0.5 SENSITIVE Sensitive     OXACILLIN >=4 RESISTANT Resistant     TETRACYCLINE <=1 SENSITIVE Sensitive     VANCOMYCIN 1 SENSITIVE Sensitive     TRIMETH/SULFA <=10 SENSITIVE Sensitive     CLINDAMYCIN >=8 RESISTANT Resistant     RIFAMPIN <=0.5 SENSITIVE Sensitive     Inducible Clindamycin NEGATIVE Sensitive     * METHICILLIN RESISTANT STAPHYLOCOCCUS AUREUS  Blood Culture ID Panel (Reflexed)     Status: Abnormal   Collection Time:  05/18/19 10:52 PM  Result Value Ref Range Status   Enterococcus species NOT DETECTED NOT DETECTED Final   Listeria monocytogenes NOT DETECTED NOT DETECTED Final   Staphylococcus species DETECTED (A) NOT DETECTED Final    Comment: CRITICAL RESULT CALLED TO, READ BACK BY AND VERIFIED WITH: G ABBOTT PHARMD 05/19/19 2254 JDW    Staphylococcus aureus (BCID) DETECTED (A) NOT DETECTED Final    Comment: Methicillin (oxacillin)-resistant Staphylococcus aureus (MRSA). MRSA is predictably resistant to beta-lactam antibiotics (except ceftaroline). Preferred therapy is vancomycin unless clinically contraindicated. Patient requires contact precautions if  hospitalized. CRITICAL RESULT CALLED TO, READ BACK BY AND VERIFIED WITH: G ABBOTT PHARMD 05/19/19 2254 JDW    Methicillin resistance DETECTED (A) NOT DETECTED Final    Comment: CRITICAL RESULT CALLED TO, READ BACK BY AND VERIFIED WITH: G ABBOTT PHARMD 05/19/19 2254 JDW    Streptococcus species NOT DETECTED NOT DETECTED Final   Streptococcus agalactiae NOT DETECTED NOT DETECTED Final   Streptococcus pneumoniae NOT DETECTED NOT DETECTED Final   Streptococcus pyogenes NOT DETECTED NOT DETECTED Final   Acinetobacter baumannii NOT DETECTED NOT DETECTED Final   Enterobacteriaceae species NOT DETECTED NOT DETECTED Final   Enterobacter cloacae complex NOT DETECTED NOT DETECTED Final   Escherichia coli NOT DETECTED NOT DETECTED Final   Klebsiella oxytoca NOT DETECTED NOT DETECTED Final   Klebsiella pneumoniae NOT DETECTED NOT DETECTED Final   Proteus species NOT DETECTED NOT DETECTED Final   Serratia marcescens NOT DETECTED NOT DETECTED Final   Haemophilus influenzae NOT DETECTED NOT DETECTED Final   Neisseria meningitidis NOT DETECTED NOT DETECTED Final   Pseudomonas aeruginosa NOT DETECTED NOT DETECTED Final   Candida albicans NOT DETECTED NOT DETECTED Final   Candida glabrata NOT DETECTED NOT DETECTED Final   Candida krusei NOT DETECTED NOT DETECTED  Final   Candida parapsilosis NOT DETECTED NOT DETECTED Final   Candida tropicalis NOT DETECTED NOT DETECTED Final    Comment: Performed at [facility], [address], [place], [place] [address]  Urine culture  Status: Abnormal   Collection Time: 05/18/19 11:38 PM   Specimen: In/Out Cath Urine  Result Value Ref Range Status   Specimen Description   Final    IN/OUT CATH URINE Performed at [facility], [address] [place], [place] [address]    Special Requests   Final    NONE Performed at [facility], [address] [place], [place] [address]    Culture >=100,000 COLONIES/mL ESCHERICHIA COLI (A)  Final   Report Status 05/21/2019 FINAL  Final   Organism ID, Bacteria ESCHERICHIA COLI (A)  Final      Susceptibility   Escherichia coli - MIC*    AMPICILLIN >=32 RESISTANT Resistant     CEFAZOLIN >=64 RESISTANT Resistant     CEFTRIAXONE 0.5 SENSITIVE Sensitive     CIPROFLOXACIN <=0.25 SENSITIVE Sensitive     GENTAMICIN <=1 SENSITIVE Sensitive     IMIPENEM <=0.25 SENSITIVE Sensitive     NITROFURANTOIN <=16 SENSITIVE Sensitive     TRIMETH/SULFA <=20 SENSITIVE Sensitive     AMPICILLIN/SULBACTAM >=32 RESISTANT Resistant     PIP/TAZO 8 SENSITIVE Sensitive     * >=100,000 COLONIES/mL ESCHERICHIA COLI  SARS CORONAVIRUS 2 (TAT 6-24 HRS) Nasopharyngeal In/Out Cath Urine     Status: None   Collection Time: 05/18/19 11:39 PM   Specimen: In/Out Cath Urine; Nasopharyngeal  Result Value Ref Range Status   SARS Coronavirus 2 NEGATIVE NEGATIVE Final    Comment: (NOTE) SARS-CoV-2 target nucleic acids are NOT DETECTED. The SARS-CoV-2 RNA is generally detectable in upper and lower respiratory specimens during the acute phase of infection. Negative results do not preclude SARS-CoV-2 infection, do not rule out co-infections with other pathogens, and should not be used as the sole basis for treatment or other patient management decisions. Negative results must be  combined with clinical observations, patient history, and epidemiological information. The expected result is Negative. Fact Sheet for Patients: HairSlick.no Fact Sheet for Healthcare Providers: quierodirigir.com This test is not yet approved or cleared by the [place] FDA and  has been authorized for detection and/or diagnosis of SARS-CoV-2 by FDA under an Emergency Use Authorization (EUA). This EUA will remain  in effect (meaning this test can be used) for the duration of the COVID-19 declaration under Section 56 4(b)(1) of the Act, 21 U.S.C. section 360bbb-3(b)(1), unless the authorization is terminated or revoked sooner. Performed at [facility], [address] [place], [place] [address]   SARS Coronavirus 2 Ag (30 min TAT) - In/Out Cath Urine     Status: None   Collection Time: 05/18/19 11:40 PM   Specimen: In/Out Cath Urine; Nasal Swab  Result Value Ref Range Status   SARS Coronavirus 2 Ag NEGATIVE NEGATIVE Final    Comment: (NOTE) SARS-CoV-2 antigen NOT DETECTED.  Negative results are presumptive.  Negative results do not preclude SARS-CoV-2 infection and should not be used as the sole basis for treatment or other patient management decisions, including infection  control decisions, particularly in the presence of clinical signs and  symptoms consistent with COVID-19, or in those who have been in contact with the virus.  Negative results must be combined with clinical observations, patient history, and epidemiological information. The expected result is Negative. Fact Sheet for Patients: https://sanders-williams.net/ Fact Sheet for Healthcare Providers: https://martinez.com/ This test is not yet approved or cleared by the [place] FDA and  has been authorized for detection and/or diagnosis of SARS-CoV-2 by FDA under an Emergency Use Authorization (  EUA).  This EUA  will remain in effect (meaning this test can be used) for the duration of  the COVID-19 de claration under Section 564(b)(1) of the Act, 21 U.S.C. section 360bbb-3(b)(1), unless the authorization is terminated or revoked sooner. Performed at [facility], [address] [place], [place] [address]   Blood Culture (routine x 2)     Status: Abnormal   Collection Time: 05/18/19 11:50 PM   Specimen: BLOOD  Result Value Ref Range Status   Specimen Description   Final    BLOOD BLOOD RIGHT ARM Performed at [facility] [place] [facility], [address] [place], [place] [address]    Special Requests   Final    BOTTLES DRAWN AEROBIC AND ANAEROBIC Blood Culture adequate volume Performed at [facility] [place] [facility], [address] [place], [place] [address]    Culture  Setup Time   Final    IN BOTH AEROBIC AND ANAEROBIC BOTTLES GRAM POSITIVE COCCI CRITICAL RESULT CALLED TO, READ BACK BY AND VERIFIED WITH: G ABBOTT PHARMD 05/19/19 2254 JDW    Culture (A)  Final    STAPHYLOCOCCUS AUREUS SUSCEPTIBILITIES PERFORMED ON PREVIOUS CULTURE WITHIN THE LAST 5 DAYS. Performed at [facility], [address] [place], [place] [address]    Report Status 05/21/2019 FINAL  Final  Culture, blood (single) w Reflex to ID Panel     Status: None (Preliminary result)   Collection Time: 05/19/19  1:40 AM   Specimen: BLOOD LEFT ARM  Result Value Ref Range Status   Specimen Description   Final    BLOOD LEFT ARM Performed at [facility] [place], [address] [place], [place] [address]    Special Requests   Final    BOTTLES DRAWN AEROBIC AND ANAEROBIC Blood Culture adequate volume Performed at [facility] [place], [address] [place], [place] [address]    Culture   Final    NO GROWTH 2 DAYS Performed at [facility], [address] [place], [place] [address]    Report Status PENDING  Incomplete  Respiratory Panel by PCR     Status: None   Collection Time: 05/19/19  5:58 AM    Specimen: Nasopharyngeal Swab; Respiratory  Result Value Ref Range Status   Adenovirus NOT DETECTED NOT DETECTED Final   Coronavirus 229E NOT DETECTED NOT DETECTED Final    Comment: (NOTE) The Coronavirus on the Respiratory Panel, DOES NOT test for the novel  Coronavirus (2019 nCoV)    Coronavirus HKU1 NOT DETECTED NOT DETECTED Final   Coronavirus NL63 NOT DETECTED NOT DETECTED Final   Coronavirus OC43 NOT DETECTED NOT DETECTED Final   Metapneumovirus NOT DETECTED NOT DETECTED Final   Rhinovirus / Enterovirus NOT DETECTED NOT DETECTED Final   Influenza A NOT DETECTED NOT DETECTED Final   Influenza B NOT DETECTED NOT DETECTED Final   Parainfluenza Virus 1 NOT DETECTED NOT DETECTED Final   Parainfluenza Virus 2 NOT DETECTED NOT DETECTED Final   Parainfluenza Virus 3 NOT DETECTED NOT DETECTED Final   Parainfluenza Virus 4 NOT DETECTED NOT DETECTED Final   Respiratory Syncytial Virus NOT DETECTED NOT DETECTED Final   Bordetella pertussis NOT DETECTED NOT DETECTED Final   Chlamydophila pneumoniae NOT DETECTED NOT DETECTED Final   Mycoplasma pneumoniae NOT DETECTED NOT DETECTED Final    Comment: Performed at [facility], [address] [place], [place] [address]  Culture, blood (routine x 2)  Status: None (Preliminary result)   Collection Time: 05/19/19  7:15 AM   Specimen: BLOOD  Result Value Ref Range Status   Specimen Description BLOOD LEFT ANTECUBITAL  Final   Special Requests   Final    BOTTLES DRAWN AEROBIC ONLY Blood Culture results may not be optimal due to an inadequate volume of blood received in culture bottles   Culture   Final    NO GROWTH 2 DAYS Performed at [facility], [address], [place], [place] [address]    Report Status PENDING  Incomplete  Culture, blood (routine x 2)     Status: None (Preliminary result)   Collection Time: 05/19/19  7:20 AM   Specimen: BLOOD RIGHT HAND  Result Value Ref Range Status   Specimen Description BLOOD RIGHT  HAND  Final   Special Requests   Final    BOTTLES DRAWN AEROBIC ONLY Blood Culture results may not be optimal due to an inadequate volume of blood received in culture bottles   Culture   Final    NO GROWTH 2 DAYS Performed at [facility], [address] [place], [place] [address]    Report Status PENDING  Incomplete         Radiology Studies: US Abdomen Limited RUQ  Result Date: 05/19/2019 CLINICAL DATA:  Elevated LFTs EXAM: ULTRASOUND ABDOMEN LIMITED RIGHT UPPER QUADRANT COMPARISON:  CT 05/19/2019 FINDINGS: Gallbladder: No shadowing stones. Wall thickness upper normal. Negative sonographic Murphy. Common bile duct: Diameter: 3.1 mm Liver: No focal lesion identified. Within normal limits in parenchymal echogenicity. Portal vein is patent on color Doppler imaging with normal direction of blood flow towards the liver. Other: None. IMPRESSION: 1. Negative for gallstones or acute gallbladder disease. 2. Ultrasound appearance of the liver is within normal limits. Electronically Signed   By: Jasmine Pang M.D.   On: 05/19/2019 22:18        Scheduled Meds: . ferrous sulfate  325 mg Oral BID  . methadone  15 mg Oral Daily  . potassium chloride  40 mEq Oral Once  . prenatal multivitamin  1 tablet Oral Daily   Continuous Infusions: . sodium chloride 125 mL/hr at 05/21/19 1010  . cefTRIAXone (ROCEPHIN)  IV 2 g (05/21/19 1304)  . vancomycin 1,000 mg (05/21/19 1011)     LOS: 2 days    Time spent: 35 minutes    Berton Mount, MD  [facility] Pager #: [phone] 7PM-7AM contact night coverage as above

## 2019-05-21 NOTE — Progress Notes (Signed)
Blood transfusion ended, patient tolerated well, no sign of distress noted. Will continue to monitor.

## 2019-05-21 NOTE — Progress Notes (Signed)
ID PROGRESS NOTE  28yo F with MRSA bacteremia, TV endocarditis with pulmonary septic emboli/pneumonia  Had high fevers last night, which would be consistent with septic pulmonary emboli/pneumonia TTE suggested abn TV with likely vegetation with mod-severe regurgitation  A/P: continue to treat for TV endocarditis, adjust vancomycin as needed to meet therapeutic range. Pharmacy to increase dose today.consider repeat cxr Do not place picc line until we have documented clearance of bacteremia. Thus far blood cx NGTD x 1 day  Dr Daiva Eves to see tomorrow

## 2019-05-22 ENCOUNTER — Inpatient Hospital Stay (HOSPITAL_COMMUNITY): Payer: Self-pay

## 2019-05-22 DIAGNOSIS — R0989 Other specified symptoms and signs involving the circulatory and respiratory systems: Secondary | ICD-10-CM | POA: Diagnosis not present

## 2019-05-22 DIAGNOSIS — M25532 Pain in left wrist: Secondary | ICD-10-CM

## 2019-05-22 DIAGNOSIS — J181 Lobar pneumonia, unspecified organism: Secondary | ICD-10-CM | POA: Diagnosis not present

## 2019-05-22 DIAGNOSIS — M25432 Effusion, left wrist: Secondary | ICD-10-CM

## 2019-05-22 DIAGNOSIS — F1910 Other psychoactive substance abuse, uncomplicated: Secondary | ICD-10-CM | POA: Diagnosis present

## 2019-05-22 DIAGNOSIS — R7881 Bacteremia: Secondary | ICD-10-CM | POA: Diagnosis not present

## 2019-05-22 DIAGNOSIS — M25511 Pain in right shoulder: Secondary | ICD-10-CM

## 2019-05-22 DIAGNOSIS — I76 Septic arterial embolism: Secondary | ICD-10-CM | POA: Diagnosis present

## 2019-05-22 DIAGNOSIS — M25512 Pain in left shoulder: Secondary | ICD-10-CM | POA: Diagnosis not present

## 2019-05-22 DIAGNOSIS — B9562 Methicillin resistant Staphylococcus aureus infection as the cause of diseases classified elsewhere: Secondary | ICD-10-CM | POA: Diagnosis not present

## 2019-05-22 DIAGNOSIS — Z8614 Personal history of Methicillin resistant Staphylococcus aureus infection: Secondary | ICD-10-CM | POA: Diagnosis present

## 2019-05-22 DIAGNOSIS — M00032 Staphylococcal arthritis, left wrist: Secondary | ICD-10-CM | POA: Diagnosis present

## 2019-05-22 DIAGNOSIS — M00011 Staphylococcal arthritis, right shoulder: Secondary | ICD-10-CM | POA: Diagnosis present

## 2019-05-22 LAB — COMPREHENSIVE METABOLIC PANEL
ALT: 61 U/L — ABNORMAL HIGH (ref 0–44)
AST: 106 U/L — ABNORMAL HIGH (ref 15–41)
Albumin: 1.5 g/dL — ABNORMAL LOW (ref 3.5–5.0)
Alkaline Phosphatase: 131 U/L — ABNORMAL HIGH (ref 38–126)
Anion gap: 10 (ref 5–15)
BUN: 7 mg/dL (ref 6–20)
CO2: 18 mmol/L — ABNORMAL LOW (ref 22–32)
Calcium: 7.8 mg/dL — ABNORMAL LOW (ref 8.9–10.3)
Chloride: 108 mmol/L (ref 98–111)
Creatinine, Ser: 0.65 mg/dL (ref 0.44–1.00)
GFR calc Af Amer: >60 mL/min (ref >60)
GFR calc non Af Amer: >60 mL/min (ref >60)
Glucose, Bld: 104 mg/dL — ABNORMAL HIGH (ref 70–99)
Potassium: 4.3 mmol/L (ref 3.5–5.1)
Sodium: 136 mmol/L (ref 135–145)
Total Bilirubin: 0.6 mg/dL (ref 0.3–1.2)
Total Protein: 5.5 g/dL — ABNORMAL LOW (ref 6.5–8.1)

## 2019-05-22 LAB — CBC WITH DIFFERENTIAL/PLATELET
Abs Immature Granulocytes: 0.13 10*3/uL — ABNORMAL HIGH (ref 0.00–0.07)
Basophils Absolute: 0 10*3/uL (ref 0.0–0.1)
Basophils Relative: 0 %
Eosinophils Absolute: 0 10*3/uL (ref 0.0–0.5)
Eosinophils Relative: 0 %
HCT: 24.4 % — ABNORMAL LOW (ref 36.0–46.0)
Hemoglobin: 7.8 g/dL — ABNORMAL LOW (ref 12.0–15.0)
Immature Granulocytes: 1 %
Lymphocytes Relative: 11 %
Lymphs Abs: 1.3 10*3/uL (ref 0.7–4.0)
MCH: 24.9 pg — ABNORMAL LOW (ref 26.0–34.0)
MCHC: 32 g/dL (ref 30.0–36.0)
MCV: 78 fL — ABNORMAL LOW (ref 80.0–100.0)
Monocytes Absolute: 0.6 10*3/uL (ref 0.1–1.0)
Monocytes Relative: 6 %
Neutro Abs: 9.5 10*3/uL — ABNORMAL HIGH (ref 1.7–7.7)
Neutrophils Relative %: 82 %
Platelets: 349 10*3/uL (ref 150–400)
RBC: 3.13 MIL/uL — ABNORMAL LOW (ref 3.87–5.11)
RDW: 18.3 % — ABNORMAL HIGH (ref 11.5–15.5)
WBC: 11.5 10*3/uL — ABNORMAL HIGH (ref 4.0–10.5)
nRBC: 0 % (ref 0.0–0.2)

## 2019-05-22 LAB — TYPE AND SCREEN
ABO/RH(D): A POS
Antibody Screen: NEGATIVE
Unit division: 0
Unit division: 0

## 2019-05-22 LAB — BPAM RBC
Blood Product Expiration Date: 202102012359
Blood Product Expiration Date: 202102212359
ISSUE DATE / TIME: 202101291533
ISSUE DATE / TIME: 202101310248
Unit Type and Rh: 6200
Unit Type and Rh: 9500

## 2019-05-22 LAB — PHOSPHORUS: Phosphorus: 3.3 mg/dL (ref 2.5–4.6)

## 2019-05-22 LAB — MAGNESIUM: Magnesium: 1.7 mg/dL (ref 1.7–2.4)

## 2019-05-22 MED ORDER — LORAZEPAM 1 MG PO TABS
1.0000 mg | ORAL_TABLET | Freq: Once | ORAL | Status: AC | PRN
  Administered 2019-05-23: 11:00:00 1 mg via ORAL
  Filled 2019-05-22 (×2): qty 1

## 2019-05-22 NOTE — Progress Notes (Signed)
Updated patients grandmother Myriam Jacobson via phone with patients permission.  Versie Starks, RN

## 2019-05-22 NOTE — Progress Notes (Signed)
Pt TEE was canceled because pt was not NPO overnight and ate snacks in her room per pt report. Gerri Lins, MD updated.  Versie Starks, RN

## 2019-05-22 NOTE — Progress Notes (Signed)
PROGRESS NOTE  Christy Pearson ZOX:[id number] DOB: Apr 14, 1991 DOA: 05/18/2019 PCP: Patient, No Pcp Per  Brief History   The patient is a 29 yr old woman with a medical history significant for hepatitis C, heroin abuse, and kidney stone. She states she takes about 14 mg methadone from the street currently. She is on 30 mg daily here. She was transferred to [facility] for investigation of bacteremia and endocarditis, and for severe sepsis. She is found to have cavitary pulmonary lesions that likely represent septic emboli, 2/2 blood cultures drawn on 05/18/2019 which have grown out MRSA. She also has significant pain in her thoracic spine, left shoulder, wrist and pelvis. MRI is planned for today. TEE is also planned for today. She has been transfused with one unit of PRBC's for anemia.  Consultants  . Infectious disease . Cardiology  Procedures  . Transfusion of one unit of PRBC's.  Antibiotics   Anti-infectives (From admission, onward)   Start     Dose/Rate Route Frequency Ordered Stop   05/21/19 1000  vancomycin (VANCOCIN) IVPB 1000 mg/200 mL premix     1,000 mg 200 mL/hr over 60 Minutes Intravenous Every 12 hours 05/21/19 0948     05/20/19 1400  cefTRIAXone (ROCEPHIN) 2 g in sodium chloride 0.9 % 100 mL IVPB  Status:  Discontinued     2 g 200 mL/hr over 30 Minutes Intravenous Every 24 hours 05/20/19 1328 05/22/19 0947   05/19/19 2200  levofloxacin (LEVAQUIN) IVPB 750 mg  Status:  Discontinued     750 mg 100 mL/hr over 90 Minutes Intravenous Every 24 hours 05/19/19 0212 05/19/19 0213   05/19/19 2200  levofloxacin (LEVAQUIN) IVPB 500 mg  Status:  Discontinued     500 mg 100 mL/hr over 60 Minutes Intravenous Every 24 hours 05/19/19 0213 05/19/19 0539   05/19/19 1000  vancomycin (VANCOREADY) IVPB 750 mg/150 mL  Status:  Discontinued     750 mg 150 mL/hr over 60 Minutes Intravenous Every 12 hours 05/19/19 0539 05/21/19 0948   05/19/19 0600  ceFEPIme (MAXIPIME) 2 g in sodium chloride 0.9 % 100  mL IVPB  Status:  Discontinued     2 g 200 mL/hr over 30 Minutes Intravenous Every 8 hours 05/19/19 0539 05/19/19 2348   05/19/19 0130  vancomycin (VANCOCIN) IVPB 1000 mg/200 mL premix     1,000 mg 200 mL/hr over 60 Minutes Intravenous  Once 05/19/19 0122 05/19/19 0253   05/18/19 2345  levofloxacin (LEVAQUIN) IVPB 750 mg     750 mg 100 mL/hr over 90 Minutes Intravenous  Once 05/18/19 2338 05/19/19 0140    .  Subjective  The patient is complaining of left shoulder, wrist and pelvis as well as thoracic back pain.  Objective   Vitals:  Vitals:   05/21/19 2031 05/22/19 0350  BP: 111/89 114/73  Pulse:  (!) 107  Resp:  14  Temp: 98.4 F (36.9 C) (!) 100.4 F (38 C)  SpO2:  96%   Exam:  Constitutional:  . The patient is awake, alert, and oriented x 3. Mild distress. Respiratory:  . No increased work of breathing. . No wheezes, rales, or rhonchi . No tactile fremitus Cardiovascular:  . Regular rate and rhythm, rate fast . No murmurs, ectopy, or gallups. . No lateral PMI. No thrills. Abdomen:  . Abdomen is soft, non-tender, non-distended . No hernias, masses, or organomegaly . Normoactive bowel sounds.  Musculoskeletal:  . No cyanosis, clubbing, or edema Skin:  . No rashes, lesions, ulcers . palpation  of skin: no induration or nodules Neurologic:  . CN 2-12 intact . Sensation all 4 extremities intact Psychiatric:  . Mental status o Mood, affect appropriate o Orientation to person, place, time  . judgment and insight appear intact  I have personally reviewed the following:   Today's Data  . T Max 24, Vitals, CMP, CBC  Micro Data  . Blood cultures x 2 from 05/18/2019 MRSA . Blood cultures x 2 from 05/18/2019 - No growth . Urine Culture 05/18/2019 - E. coli  Imaging  . CT renal stone study: Multiple cavitary and non-cavitary pulmonary lesions consistent with septic emboli. . MRI pending  Cardiology Data  . Echocardiogram 05/19/2019 demonstrated severe tricuspid  regurgitation and abnormalities suspicious for valvular vegetation due to endocarditis. . TEE: Report is pending.  Scheduled Meds: . ferrous sulfate  325 mg Oral BID  . methadone  15 mg Oral Daily  . prenatal multivitamin  1 tablet Oral Daily   Continuous Infusions: . sodium chloride 125 mL/hr at 05/22/19 1018  . vancomycin 1,000 mg (05/22/19 4656)    Principal Problem:   Suspected endocarditis Active Problems:   Injection of illicit drug within last 12 months   History of hepatitis C, self reported   Sepsis ([place])   Septic pulmonary embolism ([place])   Substance use disorder   LOS: 3 days   A & P  Severe sepsis/infective endocarditis/vertebral osteomyelitis: Blood cultures have grown MRSA. She is currently receiving IV Vancomycin as treatment for MRSA endocarditis. TEE report is pending. MRI of left shoulder, wrist and pelvis is pending as well. I appreciate infectious disease assistance. Further management will depend upon the outcome of TEE and MRI as well as hospital course. Due to the patient's history of heroin abuse, I doubt that outpatient treatment of her MRSA bacteremia/endocarditis/osteomyelitis would be appropriate. Surveillance cultures have had no growth.  UTI/possible sepsis:Urine culture has grown E. coli.  She has received her last dose of ceftriaxone today.  Bilateral pulmonary cavitary and non-cavitary lesions: These are suspicious for septic pulmonary emboli. Continue IV Vancomycin.  Anemia: The patient has been transfused with one unit of PRBC's. Likely anemia of chronic inflammation. Continue to monitor hemoglobin closely.    Transaminitis: History of hepatitis C. HIV test is nonreactive. AST and ALT are again trending up. Continue to monitor.   IV drug abuse: Patient denies (as per prior documentation). Unfortunately this is inconsistent with her disease process.  Methadone use: As per patient's statements. Patient is currently on methadone 15 Mg p.o.  once daily, but this is uncorroborated. She is currently receiving 30 mg daily.  I have seen and examined this patient myself. I have spent 38 minutes in her evaluation and care.  DVT prophylaxis: SCD's Code Status: Full Code Family Communication: None available Disposition Plan: Work up for suspected widespread osteomyelitis and tricuspid endocarditis. Further disposition will depend on results of work up and plans for treatment. Due to the patient's history of heroin abuse, I doubt that outpatient treatment of her MRSA bacteremia/endocarditis/osteomyelitis would be appropriate. Ahna Konkle, DO [facility] Direct contact: see [email or web address]  7PM-7AM contact night coverage as above 05/22/2019, 12:29 PM  LOS: 3 days

## 2019-05-22 NOTE — Progress Notes (Signed)
[facility] for Infectious Disease  Date of Admission:  05/18/2019      Total days of antibiotics 5   Vancomycin, day 5            ASSESSMENT: Christy Pearson is a 29 y.o. female who injects "ice" a few days a week here with MRSA bacteremia, cavitary bilateral pulmonary pneumonia and likely right sided endocarditis. Today her exam is also concerning for septic arthritis of the right and left shoulders and left wrist. Will get imaging with MRI W and WO contrast to evaluate. If they look infected she will need orthopedic surgery to help with surgical control over infection.  Follow up TEE, scheduled 2/2. Follow fever/wbc trends. She also has back pain involving the lumbar spine - unlikely she can tolerate all these studies; will prioritize the limbs for now since they are more likely going to require debridement. Continue vancomycin alone   Injection drug use with substance use disorder, she has been getting clean needles she uses only once with harm reduction/needle exchange clinic locally.   She has no Hepatitis C viral RNA detected indicating she has already spontaneously cleared without DAA therapy. Hep B surface antigen negative. Will inform her of results.  HIV non-reactive.    PLAN: 1. Continue vancomycin  2. Stop ceftriaxone 3. Follow up TEE 2/2 4. MRI W and WO for b/l shoulders and L wrist 5. Follow blood cultures - seems she is clearing @ 48h 6. Hold on PICC for now 7. No further treatment for hep c needed   Principal Problem:   MRSA bacteremia Active Problems:   Septic pulmonary embolism ([place])   Suspected endocarditis   Injection of illicit drug within last 12 months   History of hepatitis C, self reported   Sepsis ([place])   Substance use disorder   . ferrous sulfate  325 mg Oral BID  . methadone  15 mg Oral Daily  . prenatal multivitamin  1 tablet Oral Daily     SUBJECTIVE: Intense lower back pain that is worse than usual for her. She also  describes bilateral shoulder pain/swelling with inability to raise her arms over the level of shoulders. Left wrist swelling and pain.   Febrile overnight with temp 100.4 F. WBC slightly increased 11.5K. She is getting IV Toradol and PO ibuprofen.    Review of Systems: Review of Systems  Constitutional: Positive for fever and malaise/fatigue.  HENT: Negative for sore throat.   Respiratory: Negative for cough and shortness of breath.   Cardiovascular: Positive for chest pain.  Gastrointestinal: Negative for abdominal pain and diarrhea.  Genitourinary: Negative for dysuria.  Musculoskeletal: Positive for back pain and joint pain.  Skin: Negative for itching and rash.  Neurological: Negative for dizziness and headaches.    Allergies  Allergen Reactions  . Tylenol [Acetaminophen] Rash    Pt just reported tylenol allergy at this visit at 2338pm  . Penicillins     OBJECTIVE: Vitals:   05/21/19 1845 05/21/19 1849 05/21/19 2031 05/22/19 0350  BP:  103/75 111/89 114/73  Pulse: 77 79  (!) 107  Resp: 18 12  14   Temp:  98.1 F (36.7 C) 98.4 F (36.9 C) (!) 100.4 F (38 C)  TempSrc:  Oral Oral Oral  SpO2: 99% 98%  96%  Weight:      Height:       Body mass index is 19.51 kg/m.  Physical Exam Constitutional:      Comments:  Appears uncomfortably resting in bed.   HENT:     Mouth/Throat:     Mouth: Mucous membranes are moist.     Pharynx: Oropharynx is clear.  Eyes:     General: No scleral icterus. Cardiovascular:     Rate and Rhythm: Regular rhythm. Tachycardia present.     Heart sounds: Murmur present.  Abdominal:     General: Bowel sounds are normal. There is no distension.  Musculoskeletal:     Right shoulder: Tenderness present. Decreased range of motion.     Left shoulder: Tenderness present. Decreased range of motion.     Left wrist: Swelling and tenderness present. Decreased range of motion.  Skin:    General: Skin is warm and dry.     Capillary Refill:  Capillary refill takes less than 2 seconds.     Coloration: Skin is not jaundiced.  Neurological:     Mental Status: She is alert and oriented to person, place, and time.     Lab Results Lab Results  Component Value Date   WBC 11.5 (H) 05/22/2019   HGB 7.8 (L) 05/22/2019   HCT 24.4 (L) 05/22/2019   MCV 78.0 (L) 05/22/2019   PLT 349 05/22/2019    Lab Results  Component Value Date   CREATININE 0.65 05/22/2019   BUN 7 05/22/2019   NA 136 05/22/2019   K 4.3 05/22/2019   CL 108 05/22/2019   CO2 18 (L) 05/22/2019    Lab Results  Component Value Date   ALT 61 (H) 05/22/2019   AST 106 (H) 05/22/2019   ALKPHOS 131 (H) 05/22/2019   BILITOT 0.6 05/22/2019     Microbiology: Recent Results (from the past 240 hour(s))  Blood Culture (routine x 2)     Status: Abnormal   Collection Time: 05/18/19 10:52 PM   Specimen: BLOOD  Result Value Ref Range Status   Specimen Description   Final    BLOOD BLOOD LEFT ARM Performed at [facility] [address] [place], [place] [address]    Special Requests   Final    BOTTLES DRAWN AEROBIC AND ANAEROBIC Blood Culture adequate volume Performed at [facility], [address] [place], [place] [address]    Culture  Setup Time   Final    IN BOTH AEROBIC AND ANAEROBIC BOTTLES GRAM POSITIVE COCCI CRITICAL RESULT CALLED TO, READ BACK BY AND VERIFIED WITH: Evelena Peat [facility] 05/19/19 2254 JDW Performed at [facility], [address], [place], [place] [address]    Culture METHICILLIN RESISTANT STAPHYLOCOCCUS AUREUS (A)  Final   Report Status 05/21/2019 FINAL  Final   Organism ID, Bacteria METHICILLIN RESISTANT STAPHYLOCOCCUS AUREUS  Final      Susceptibility   Methicillin resistant staphylococcus aureus - MIC*    CIPROFLOXACIN <=0.5 SENSITIVE Sensitive     ERYTHROMYCIN >=8 RESISTANT Resistant     GENTAMICIN <=0.5 SENSITIVE Sensitive     OXACILLIN >=4 RESISTANT Resistant     TETRACYCLINE <=1 SENSITIVE Sensitive      VANCOMYCIN 1 SENSITIVE Sensitive     TRIMETH/SULFA <=10 SENSITIVE Sensitive     CLINDAMYCIN >=8 RESISTANT Resistant     RIFAMPIN <=0.5 SENSITIVE Sensitive     Inducible Clindamycin NEGATIVE Sensitive     * METHICILLIN RESISTANT STAPHYLOCOCCUS AUREUS  Blood Culture ID Panel (Reflexed)     Status: Abnormal   Collection Time: 05/18/19 10:52 PM  Result Value Ref Range Status   Enterococcus species NOT DETECTED NOT DETECTED  Final   Listeria monocytogenes NOT DETECTED NOT DETECTED Final   Staphylococcus species DETECTED (A) NOT DETECTED Final    Comment: CRITICAL RESULT CALLED TO, READ BACK BY AND VERIFIED WITH: G ABBOTT PHARMD 05/19/19 2254 JDW    Staphylococcus aureus (BCID) DETECTED (A) NOT DETECTED Final    Comment: Methicillin (oxacillin)-resistant Staphylococcus aureus (MRSA). MRSA is predictably resistant to beta-lactam antibiotics (except ceftaroline). Preferred therapy is vancomycin unless clinically contraindicated. Patient requires contact precautions if  hospitalized. CRITICAL RESULT CALLED TO, READ BACK BY AND VERIFIED WITH: G ABBOTT PHARMD 05/19/19 2254 JDW    Methicillin resistance DETECTED (A) NOT DETECTED Final    Comment: CRITICAL RESULT CALLED TO, READ BACK BY AND VERIFIED WITH: G ABBOTT PHARMD 05/19/19 2254 JDW    Streptococcus species NOT DETECTED NOT DETECTED Final   Streptococcus agalactiae NOT DETECTED NOT DETECTED Final   Streptococcus pneumoniae NOT DETECTED NOT DETECTED Final   Streptococcus pyogenes NOT DETECTED NOT DETECTED Final   Acinetobacter baumannii NOT DETECTED NOT DETECTED Final   Enterobacteriaceae species NOT DETECTED NOT DETECTED Final   Enterobacter cloacae complex NOT DETECTED NOT DETECTED Final   Escherichia coli NOT DETECTED NOT DETECTED Final   Klebsiella oxytoca NOT DETECTED NOT DETECTED Final   Klebsiella pneumoniae NOT DETECTED NOT DETECTED Final   Proteus species NOT DETECTED NOT DETECTED Final   Serratia marcescens NOT DETECTED NOT DETECTED  Final   Haemophilus influenzae NOT DETECTED NOT DETECTED Final   Neisseria meningitidis NOT DETECTED NOT DETECTED Final   Pseudomonas aeruginosa NOT DETECTED NOT DETECTED Final   Candida albicans NOT DETECTED NOT DETECTED Final   Candida glabrata NOT DETECTED NOT DETECTED Final   Candida krusei NOT DETECTED NOT DETECTED Final   Candida parapsilosis NOT DETECTED NOT DETECTED Final   Candida tropicalis NOT DETECTED NOT DETECTED Final    Comment: Performed at [facility], [address] [place], [place] [address]  Urine culture     Status: Abnormal   Collection Time: 05/18/19 11:38 PM   Specimen: In/Out Cath Urine  Result Value Ref Range Status   Specimen Description   Final    IN/OUT CATH URINE Performed at [facility], [address] [place], [place] [address]    Special Requests   Final    NONE Performed at [facility], [address] [place], [place] [address]    Culture >=100,000 COLONIES/mL ESCHERICHIA COLI (A)  Final   Report Status 05/21/2019 FINAL  Final   Organism ID, Bacteria ESCHERICHIA COLI (A)  Final      Susceptibility   Escherichia coli - MIC*    AMPICILLIN >=32 RESISTANT Resistant     CEFAZOLIN >=64 RESISTANT Resistant     CEFTRIAXONE 0.5 SENSITIVE Sensitive     CIPROFLOXACIN <=0.25 SENSITIVE Sensitive     GENTAMICIN <=1 SENSITIVE Sensitive     IMIPENEM <=0.25 SENSITIVE Sensitive     NITROFURANTOIN <=16 SENSITIVE Sensitive     TRIMETH/SULFA <=20 SENSITIVE Sensitive     AMPICILLIN/SULBACTAM >=32 RESISTANT Resistant     PIP/TAZO 8 SENSITIVE Sensitive     * >=100,000 COLONIES/mL ESCHERICHIA COLI  SARS CORONAVIRUS 2 (TAT 6-24 HRS) Nasopharyngeal In/Out Cath Urine     Status: None   Collection Time: 05/18/19 11:39 PM   Specimen: In/Out Cath Urine; Nasopharyngeal  Result Value Ref Range Status   SARS Coronavirus 2 NEGATIVE NEGATIVE Final    Comment: (NOTE) SARS-CoV-2 target nucleic acids are NOT DETECTED. The SARS-CoV-2 RNA is  generally detectable in upper and lower respiratory specimens during the acute phase of infection. Negative results do not preclude SARS-CoV-2 infection, do not rule out co-infections with other pathogens, and should not be used as the sole basis for treatment or other patient management decisions. Negative results must be combined with clinical observations, patient history, and epidemiological information. The expected result is Negative. Fact Sheet for Patients: HairSlick.no Fact Sheet for Healthcare Providers: quierodirigir.com This test is not yet approved or cleared by the [place] FDA and  has been authorized for detection and/or diagnosis of SARS-CoV-2 by FDA under an Emergency Use Authorization (EUA). This EUA will remain  in effect (meaning this test can be used) for the duration of the COVID-19 declaration under Section 56 4(b)(1) of the Act, 21 U.S.C. section 360bbb-3(b)(1), unless the authorization is terminated or revoked sooner. Performed at [facility], [address] [place], [place] [address]   SARS Coronavirus 2 Ag (30 min TAT) - In/Out Cath Urine     Status: None   Collection Time: 05/18/19 11:40 PM   Specimen: In/Out Cath Urine; Nasal Swab  Result Value Ref Range Status   SARS Coronavirus 2 Ag NEGATIVE NEGATIVE Final    Comment: (NOTE) SARS-CoV-2 antigen NOT DETECTED.  Negative results are presumptive.  Negative results do not preclude SARS-CoV-2 infection and should not be used as the sole basis for treatment or other patient management decisions, including infection  control decisions, particularly in the presence of clinical signs and  symptoms consistent with COVID-19, or in those who have been in contact with the virus.  Negative results must be combined with clinical observations, patient history, and epidemiological information. The expected result is Negative. Fact Sheet for Patients:  https://sanders-williams.net/ Fact Sheet for Healthcare Providers: https://martinez.com/ This test is not yet approved or cleared by the [place] FDA and  has been authorized for detection and/or diagnosis of SARS-CoV-2 by FDA under an Emergency Use Authorization (EUA).  This EUA will remain in effect (meaning this test can be used) for the duration of  the COVID-19 de claration under Section 564(b)(1) of the Act, 21 U.S.C. section 360bbb-3(b)(1), unless the authorization is terminated or revoked sooner. Performed at [facility], [address] [place], [place] [address]   Blood Culture (routine x 2)     Status: Abnormal   Collection Time: 05/18/19 11:50 PM   Specimen: BLOOD  Result Value Ref Range Status   Specimen Description   Final    BLOOD BLOOD RIGHT ARM Performed at [facility], [address] [place], [place] [address]    Special Requests   Final    BOTTLES DRAWN AEROBIC AND ANAEROBIC Blood Culture adequate volume Performed at [facility], [address] [place], [place] [address]    Culture  Setup Time   Final    IN BOTH AEROBIC AND ANAEROBIC BOTTLES GRAM POSITIVE COCCI CRITICAL RESULT CALLED TO, READ BACK BY AND VERIFIED WITH: G ABBOTT PHARMD 05/19/19 2254 JDW    Culture (A)  Final    STAPHYLOCOCCUS AUREUS SUSCEPTIBILITIES PERFORMED ON PREVIOUS CULTURE WITHIN THE LAST 5 DAYS. Performed at [facility], [address] [place], [place] [address]    Report Status 05/21/2019 FINAL  Final  Culture, blood (single) w Reflex to ID Panel     Status: None (Preliminary result)   Collection Time: 05/19/19  1:40 AM   Specimen: BLOOD LEFT ARM  Result Value Ref  Range Status   Specimen Description   Final    BLOOD LEFT ARM Performed at [facility], [address] [place], [place] [address]    Special Requests   Final    BOTTLES DRAWN AEROBIC AND ANAEROBIC Blood Culture adequate  volume Performed at [facility], [address] [place], [place] [address]    Culture   Final    NO GROWTH 3 DAYS Performed at [facility], [address] [place], [place] [address]    Report Status PENDING  Incomplete  Respiratory Panel by PCR     Status: None   Collection Time: 05/19/19  5:58 AM   Specimen: Nasopharyngeal Swab; Respiratory  Result Value Ref Range Status   Adenovirus NOT DETECTED NOT DETECTED Final   Coronavirus 229E NOT DETECTED NOT DETECTED Final    Comment: (NOTE) The Coronavirus on the Respiratory Panel, DOES NOT test for the novel  Coronavirus (2019 nCoV)    Coronavirus HKU1 NOT DETECTED NOT DETECTED Final   Coronavirus NL63 NOT DETECTED NOT DETECTED Final   Coronavirus OC43 NOT DETECTED NOT DETECTED Final   Metapneumovirus NOT DETECTED NOT DETECTED Final   Rhinovirus / Enterovirus NOT DETECTED NOT DETECTED Final   Influenza A NOT DETECTED NOT DETECTED Final   Influenza B NOT DETECTED NOT DETECTED Final   Parainfluenza Virus 1 NOT DETECTED NOT DETECTED Final   Parainfluenza Virus 2 NOT DETECTED NOT DETECTED Final   Parainfluenza Virus 3 NOT DETECTED NOT DETECTED Final   Parainfluenza Virus 4 NOT DETECTED NOT DETECTED Final   Respiratory Syncytial Virus NOT DETECTED NOT DETECTED Final   Bordetella pertussis NOT DETECTED NOT DETECTED Final   Chlamydophila pneumoniae NOT DETECTED NOT DETECTED Final   Mycoplasma pneumoniae NOT DETECTED NOT DETECTED Final    Comment: Performed at [facility], [address] [place], [place] [address]  Culture, blood (routine x 2)     Status: None (Preliminary result)   Collection Time: 05/19/19  7:15 AM   Specimen: BLOOD  Result Value Ref Range Status   Specimen Description BLOOD LEFT ANTECUBITAL  Final   Special Requests   Final    BOTTLES DRAWN AEROBIC ONLY Blood Culture results may not be optimal due to an inadequate volume of blood received in culture bottles   Culture   Final    NO GROWTH 3  DAYS Performed at [facility], [address] [place], [place] [address]    Report Status PENDING  Incomplete  Culture, blood (routine x 2)     Status: None (Preliminary result)   Collection Time: 05/19/19  7:20 AM   Specimen: BLOOD RIGHT HAND  Result Value Ref Range Status   Specimen Description BLOOD RIGHT HAND  Final   Special Requests   Final    BOTTLES DRAWN AEROBIC ONLY Blood Culture results may not be optimal due to an inadequate volume of blood received in culture bottles   Culture   Final    NO GROWTH 3 DAYS Performed at [facility], [address] [place], [place] [address]    Report Status PENDING  Incomplete     Rexene AlbertsStephanie Chenee Munns, MSN, NP-C [facility]  [email or web address] Pager: [phone] Office: [phone] [facility] Main Line: [phone]

## 2019-05-22 NOTE — Anesthesia Preprocedure Evaluation (Addendum)
Anesthesia Evaluation  Patient identified by MRN, date of birth, ID band Patient awake    Reviewed: Allergy & Precautions, NPO status , Patient's Chart, lab work & pertinent test results  History of Anesthesia Complications Negative for: history of anesthetic complications  Airway Mallampati: II  TM Distance: >3 FB Neck ROM: Full  Mouth opening: Limited Mouth Opening  Dental  (+) Poor Dentition, Chipped, Missing,    Pulmonary Current Smoker,    Pulmonary exam normal        Cardiovascular Normal cardiovascular exam  TTE 05/19/19: normal EF, mild MR, mod-severe TR with suspicious lesion for endocarditis   Neuro/Psych negative neurological ROS  negative psych ROS   GI/Hepatic negative GI ROS, (+)     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  Reproductive/Obstetrics negative OB ROS                           Anesthesia Physical Anesthesia Plan  ASA: III  Anesthesia Plan: MAC   Post-op Pain Management:    Induction:   PONV Risk Score and Plan: 1 and Treatment may vary due to age or medical condition and Propofol infusion  Airway Management Planned: Natural Airway and Nasal Cannula  Additional Equipment: None  Intra-op Plan:   Post-operative Plan:   Informed Consent: I have reviewed the patients History and Physical, chart, labs and discussed the procedure including the risks, benefits and alternatives for the proposed anesthesia with the patient or authorized representative who has indicated his/her understanding and acceptance.     Dental advisory given  Plan  Discussed with: CRNA  Anesthesia Plan Comments:        Anesthesia Quick Evaluation

## 2019-05-23 ENCOUNTER — Inpatient Hospital Stay (HOSPITAL_COMMUNITY): Payer: Self-pay | Admitting: Certified Registered Nurse Anesthetist

## 2019-05-23 ENCOUNTER — Inpatient Hospital Stay (HOSPITAL_COMMUNITY): Payer: Self-pay

## 2019-05-23 ENCOUNTER — Encounter (HOSPITAL_COMMUNITY)
Admission: EM | Disposition: A | Discharge status: Home / Self Care | Payer: Self-pay | Source: Home / Self Care | Attending: Internal Medicine

## 2019-05-23 DIAGNOSIS — Q211 Atrial septal defect: Secondary | ICD-10-CM

## 2019-05-23 DIAGNOSIS — I079 Rheumatic tricuspid valve disease, unspecified: Secondary | ICD-10-CM | POA: Diagnosis present

## 2019-05-23 DIAGNOSIS — I339 Acute and subacute endocarditis, unspecified: Secondary | ICD-10-CM

## 2019-05-23 DIAGNOSIS — R7881 Bacteremia: Secondary | ICD-10-CM | POA: Diagnosis not present

## 2019-05-23 DIAGNOSIS — Q2112 Patent foramen ovale: Secondary | ICD-10-CM

## 2019-05-23 DIAGNOSIS — I361 Nonrheumatic tricuspid (valve) insufficiency: Secondary | ICD-10-CM

## 2019-05-23 DIAGNOSIS — B9562 Methicillin resistant Staphylococcus aureus infection as the cause of diseases classified elsewhere: Secondary | ICD-10-CM | POA: Diagnosis not present

## 2019-05-23 DIAGNOSIS — I330 Acute and subacute infective endocarditis: Secondary | ICD-10-CM | POA: Diagnosis not present

## 2019-05-23 HISTORY — PX: TEE WITHOUT CARDIOVERSION: SHX5443

## 2019-05-23 HISTORY — PX: BUBBLE STUDY: SHX6837

## 2019-05-23 LAB — CBC
HCT: 23.5 % — ABNORMAL LOW (ref 36.0–46.0)
Hemoglobin: 7.4 g/dL — ABNORMAL LOW (ref 12.0–15.0)
MCH: 24.8 pg — ABNORMAL LOW (ref 26.0–34.0)
MCHC: 31.5 g/dL (ref 30.0–36.0)
MCV: 78.9 fL — ABNORMAL LOW (ref 80.0–100.0)
Platelets: 324 10*3/uL (ref 150–400)
RBC: 2.98 MIL/uL — ABNORMAL LOW (ref 3.87–5.11)
RDW: 18.5 % — ABNORMAL HIGH (ref 11.5–15.5)
WBC: 9.2 10*3/uL (ref 4.0–10.5)
nRBC: 0 % (ref 0.0–0.2)

## 2019-05-23 LAB — COMPREHENSIVE METABOLIC PANEL
ALT: 112 U/L — ABNORMAL HIGH (ref 0–44)
AST: 158 U/L — ABNORMAL HIGH (ref 15–41)
Albumin: 1.4 g/dL — ABNORMAL LOW (ref 3.5–5.0)
Alkaline Phosphatase: 159 U/L — ABNORMAL HIGH (ref 38–126)
Anion gap: 8 (ref 5–15)
BUN: 6 mg/dL (ref 6–20)
CO2: 20 mmol/L — ABNORMAL LOW (ref 22–32)
Calcium: 7.8 mg/dL — ABNORMAL LOW (ref 8.9–10.3)
Chloride: 108 mmol/L (ref 98–111)
Creatinine, Ser: 0.55 mg/dL (ref 0.44–1.00)
GFR calc Af Amer: >60 mL/min (ref >60)
GFR calc non Af Amer: >60 mL/min (ref >60)
Glucose, Bld: 91 mg/dL (ref 70–99)
Potassium: 3.8 mmol/L (ref 3.5–5.1)
Sodium: 136 mmol/L (ref 135–145)
Total Bilirubin: 0.7 mg/dL (ref 0.3–1.2)
Total Protein: 5.5 g/dL — ABNORMAL LOW (ref 6.5–8.1)

## 2019-05-23 LAB — VANCOMYCIN, TROUGH: Vancomycin Tr: 9 ug/mL — ABNORMAL LOW (ref 15–20)

## 2019-05-23 LAB — VANCOMYCIN, PEAK: Vancomycin Pk: 29 ug/mL — ABNORMAL LOW (ref 30–40)

## 2019-05-23 IMAGING — MR MR SHOULDER*R* W/O CM
7 series · 40 of 40 positions shown · non-contrast
Comparison: None.

CLINICAL DATA: Sepsis, shoulder pain

EXAM:
MRI OF THE RIGHT SHOULDER WITHOUT CONTRAST
TECHNIQUE: Multiplanar, multisequence MR imaging of the shoulder was performed.
No intravenous contrast was administered.

[Series 9: PD fat-sat · axial · right · 4.0mm · 0.36mm/px · z∈[-191,-81]mm · 9 of 25 slices shown (1 of 2)]
[im 1/25]
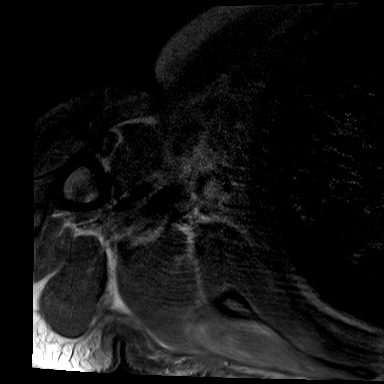
[im 4/25]
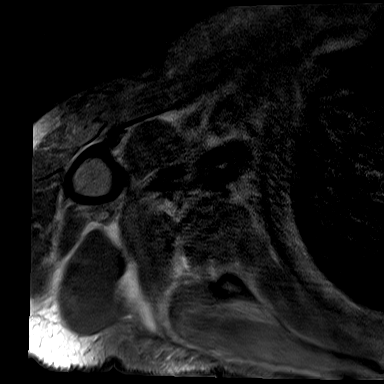
[im 7/25]
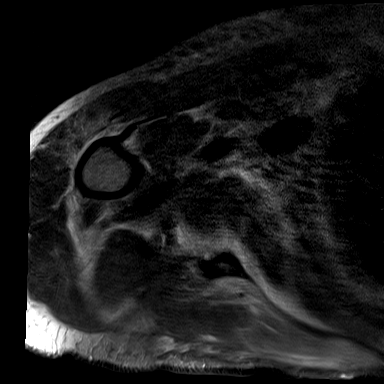
[im 10/25]
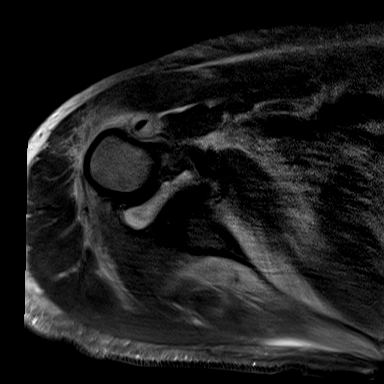
[im 13/25]
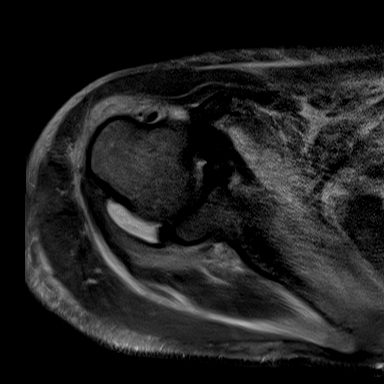
[im 16/25]
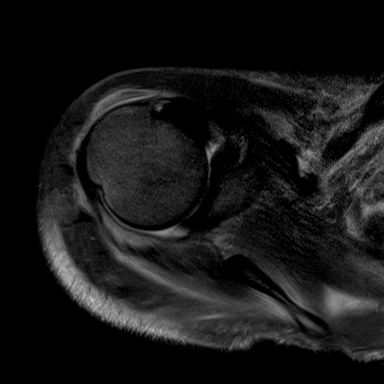
[im 19/25]
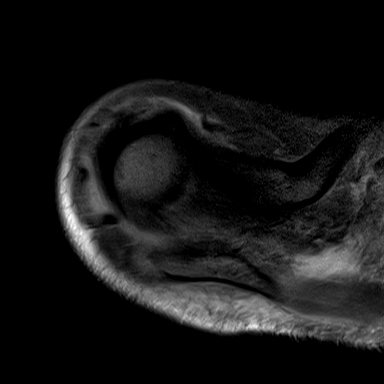
[im 22/25]
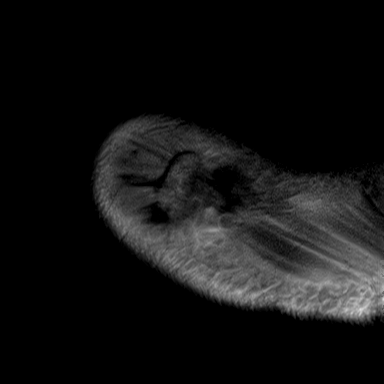
[im 25/25]
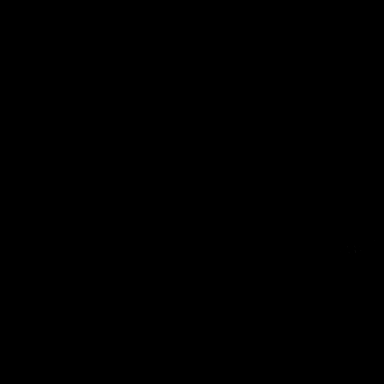

[Series 10: T2 fat-sat · oblique · right · 4.0mm · 0.44mm/px · 6 of 18 slices shown (1 of 2)]
[im 1/18]
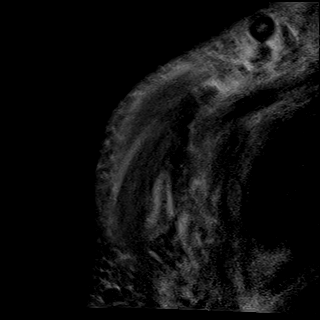
[im 4/18]
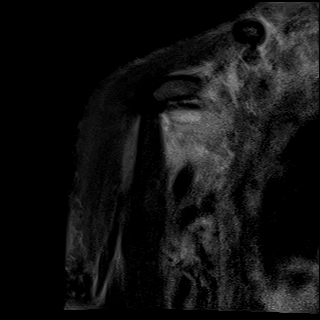
[im 7/18]
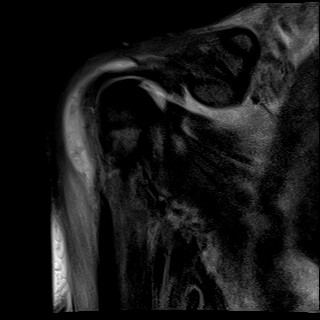
[im 11/18]
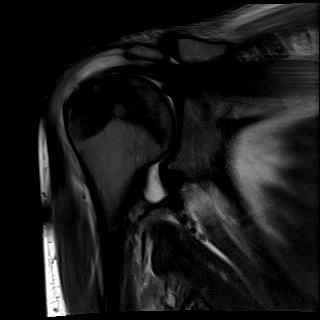
[im 14/18]
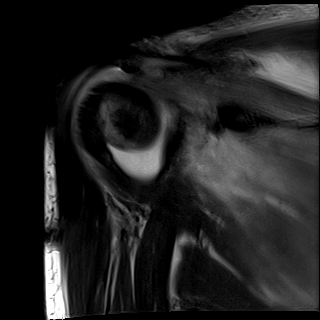
[im 18/18]
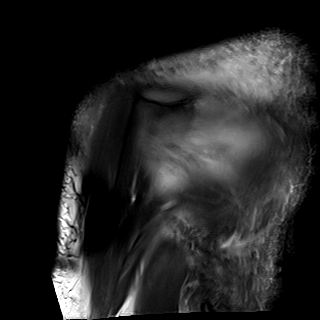

[Series 11: PD fat-sat · oblique · right · 4.0mm · 0.39mm/px · 5 of 18 slices shown (2 of 2)]
[im 1/18]
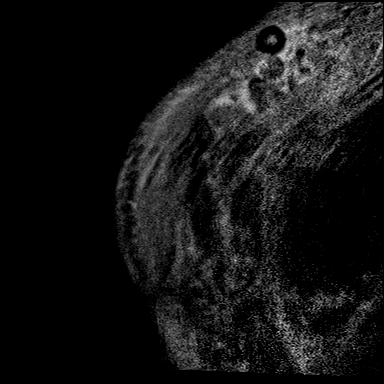
[im 5/18]
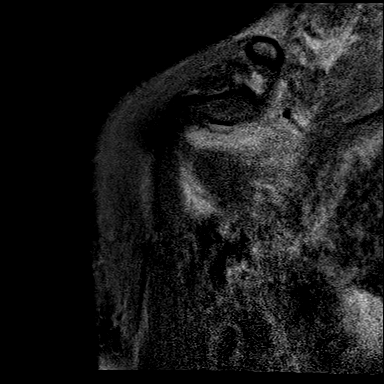
[im 9/18]
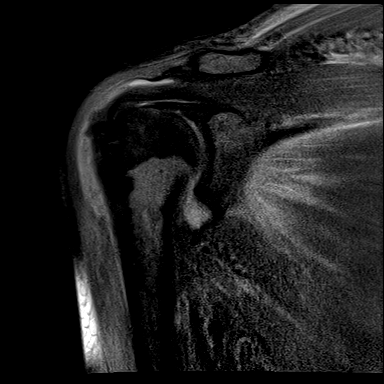
[im 13/18]
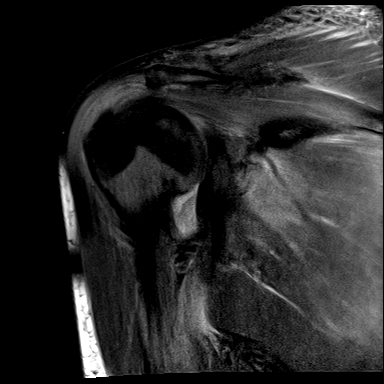
[im 18/18]
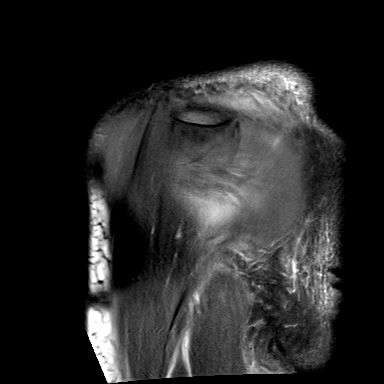

[Series 12: T2 fat-sat · sagittal · right · 4.0mm · 0.44mm/px · 5 of 18 slices shown (2 of 2)]
[im 1/18]
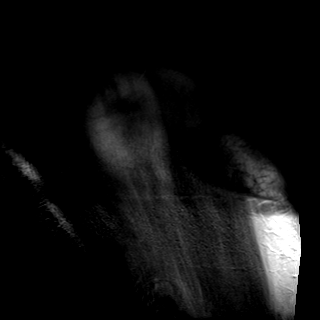
[im 5/18]
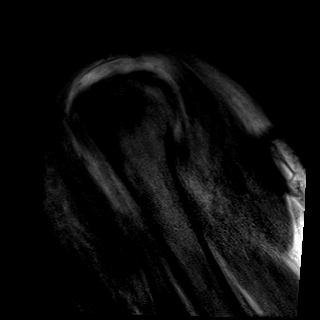
[im 9/18]
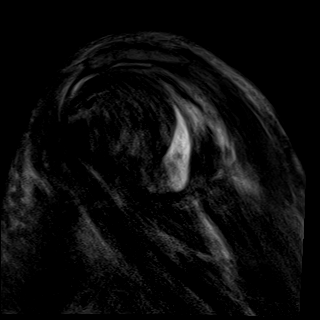
[im 13/18]
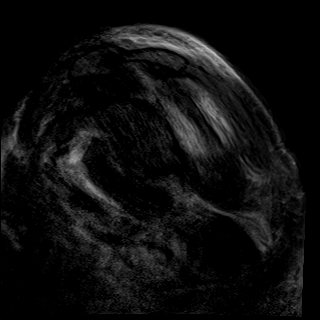
[im 18/18]
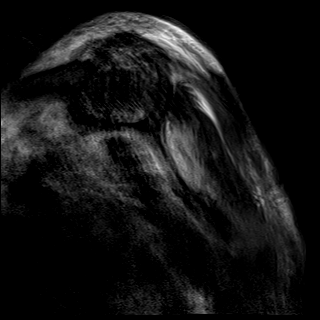

[Series 13: T1 · sagittal · right · 4.0mm · 0.36mm/px · 5 of 18 slices shown (1 of 2)]
[im 1/18]
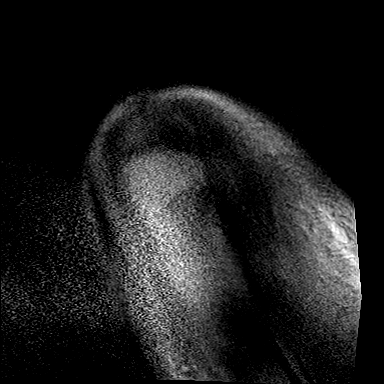
[im 5/18]
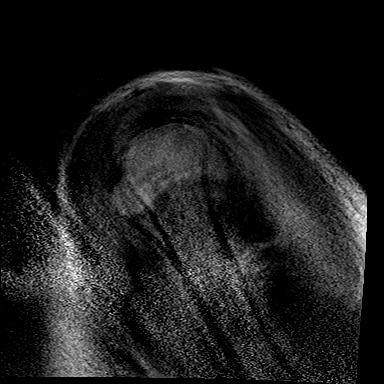
[im 9/18]
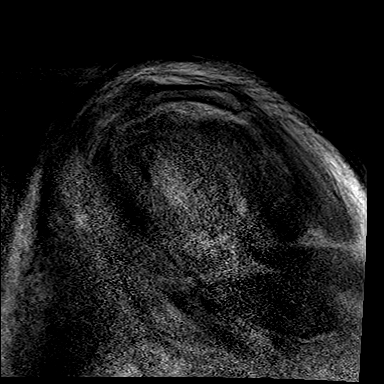
[im 13/18]
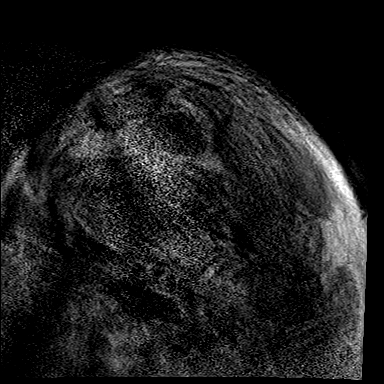
[im 18/18]
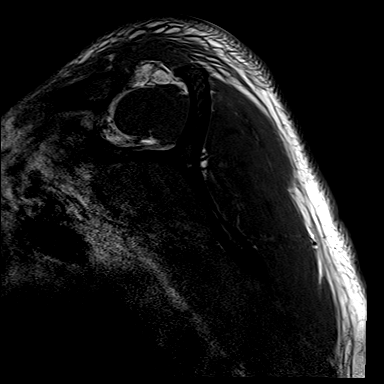

[Series 14: T1 · sagittal · right · 4.0mm · 0.55mm/px · 5 of 18 slices shown (2 of 2)]
[im 1/18]
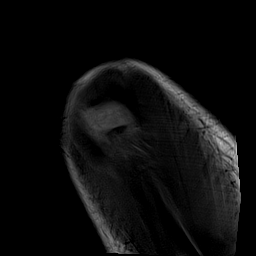
[im 5/18]
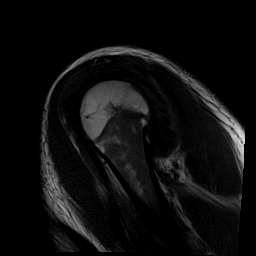
[im 9/18]
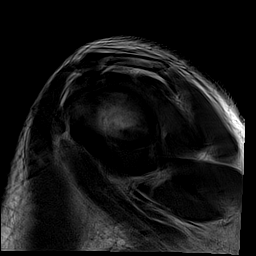
[im 13/18]
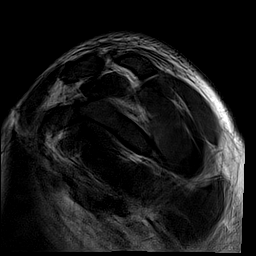
[im 18/18]
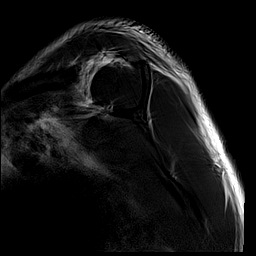

[Series 15: T2 · sagittal · right · 4.0mm · 0.46mm/px · 5 of 18 slices shown]
[im 1/18]
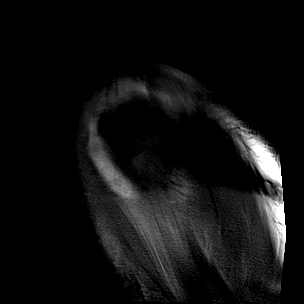
[im 5/18]
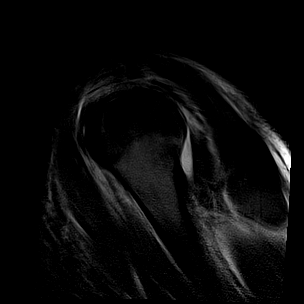
[im 9/18]
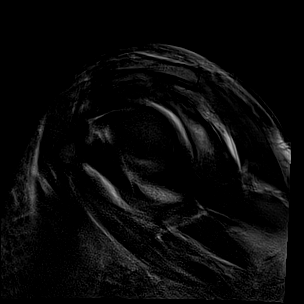
[im 13/18]
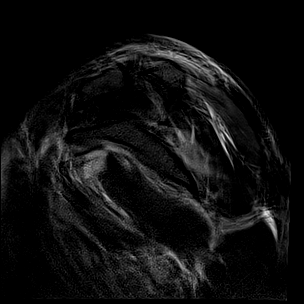
[im 18/18]
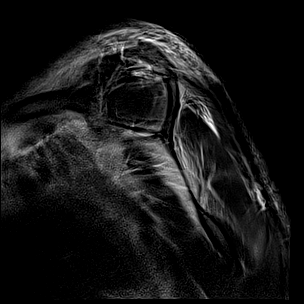

[40 of 40 positions shown; findings below may reference images not displayed]

FINDINGS: Technical note: Examination is significantly degraded by patient
motion artifact. Patient would not tolerate any further repeated
sequences. No contrast was administered.

Rotator cuff: Bursal sided irregularity of the distal supraspinatus
and infraspinatus tendons which could reflect low-grade
partial-thickness tearing or represent changes related to overlying
bursitis within the subacromial-subdeltoid bursa. There is no
full-thickness rotator cuff tear.

Muscles: Rotator cuff musculature is mildly edematous. No atrophy or
fatty infiltration.

Biceps long head: Complex fluid distends the extra-articular biceps
tendon sheath. Biceps tendon is intact without tear.

Acromioclavicular Joint: Normal acromioclavicular joint. Moderate
volume subacromial-subdeltoid bursitis.

Glenohumeral Joint: Moderate to large glenohumeral joint effusion
with synovitis. No chondral defect within the limitations of this
motion limited exam.

Labrum:  Grossly intact, poorly evaluated.

Bones: No marrow abnormality, fracture or dislocation. No evidence
to suggest osteomyelitis.

Other: Extensive soft tissue edema within the visualized chest wall.
Ill-defined patchy opacity within the visualized portion of the
right lung, which is poorly evaluated by MRI.
IMPRESSION: 1. Limited exam.
2. Moderate-to-large glenohumeral joint effusion with synovitis.
Findings are suspicious for septic arthritis in the setting of known
systemic infection. Arthrocentesis is recommended.
3. Moderate subacromial-subdeltoid bursitis.
4. No evidence to suggest osteomyelitis.
5. Bursal sided irregularity of the distal supraspinatus and
infraspinatus tendons which could reflect low-grade
partial-thickness tearing or represent changes related to overlying
bursitis within the subacromial-subdeltoid bursa. No full-thickness
rotator cuff tear.

These results will be called to the ordering clinician or
representative by the Radiologist Assistant, and communication
documented in the PACS or zVision Dashboard.

## 2019-05-23 SURGERY — ECHOCARDIOGRAM, TRANSESOPHAGEAL
Anesthesia: Monitor Anesthesia Care

## 2019-05-23 MED ORDER — ENOXAPARIN SODIUM 40 MG/0.4ML ~~LOC~~ SOLN
40.0000 mg | SUBCUTANEOUS | Status: DC
  Filled 2019-05-23 (×14): qty 0.4

## 2019-05-23 MED ORDER — SODIUM CHLORIDE 0.9 % IV SOLN: INTRAVENOUS | Status: DC

## 2019-05-23 MED ORDER — PROPOFOL 10 MG/ML IV BOLUS
INTRAVENOUS | Status: DC | PRN
  Administered 2019-05-23: 20 mg via INTRAVENOUS
  Administered 2019-05-23: 10 mg via INTRAVENOUS
  Administered 2019-05-23: 20 mg via INTRAVENOUS

## 2019-05-23 MED ORDER — PROPOFOL 500 MG/50ML IV EMUL
INTRAVENOUS | Status: DC | PRN
  Administered 2019-05-23: 175 ug/kg/min via INTRAVENOUS

## 2019-05-23 NOTE — Progress Notes (Addendum)
Pharmacy Antibiotic Note  Christy Pearson is a 29 y.o. female admitted on 05/18/2019 with MRSA bacteremia and TV endocarditis. Blood culture on 1/28 was positive for MRSA and repeat blood cultures on 1/29 are no growth to date. TEE found one 0.5cm vegetations on at least 2 leaflets of TV.  Pharmacy has been consulted for vancomycin dosing for endocarditis. Patient has been receiving vancomycin 1000mg  IV q12h. Scr is stable. WBC wnl. Afebrile.   Vancomycin levels obtained on 2/2. Vancomycin trough was 9 and vancomycin peak was 29. Calculated AUC is 442 which is in goal of 400-550.  Of note, MRI of right shoulder today found moderate to large glenohumeral joint effusion with synovitis and findings suspicious for septic arthritis in setting of known systemic infection.   Plan: Continue vancomycin 1000mg  IV q12h  Obtain vancomycin levels as appropriate  Monitor renal function, cultures/sensitivites, and clinical progression  Follow up CVTS recommendations   Height: 5\' 7"  (170.2 cm) Weight: 124 lb 9 oz (56.5 kg) IBW/kg (Calculated) : 61.6  Temp (24hrs), Avg:99.2 F (37.3 C), Min:98.4 F (36.9 C), Max:100.3 F (37.9 C)  Recent Labs  Lab 05/18/19 2252 05/18/19 2252 05/18/19 2338 05/19/19 0126 05/19/19 0728 05/20/19 0258 05/21/19 0049 05/21/19 0902 05/21/19 1035 05/22/19 0406 05/23/19 0536 05/23/19 1006  WBC 10.3   < >  --   --   --  2.5* 8.0  --  10.5 11.5* 9.2  --   CREATININE 0.90  --    < >  --  0.87 0.61 0.62  --   --  0.65 0.55  --   LATICACIDVEN 2.4*  --   --  1.7  --   --   --   --   --   --   --   --   VANCOTROUGH  --   --   --   --   --   --   --  9*  --   --   --  9*  VANCOPEAK  --   --   --   --   --   --  21*  --   --   --   --   --    < > = values in this interval not displayed.    Estimated Creatinine Clearance: 95 mL/min (by C-G formula based on SCr of 0.55 mg/dL).    Allergies  Allergen Reactions  . Tylenol [Acetaminophen] Rash    Pt just reported tylenol  allergy at this visit at 2338pm  . Penicillins     Antimicrobials this admission: Vancomycin 1/29 >>  CTX 1/30 >> 2/1 Cefepime 1/29 x3 doses  Dose adjustments this admission: 1/31: VP 29, VT 9, AUC 366 - increased dose to 1000 mg Q12 hrs  Microbiology results: 1/28 BCx: MRSA 1/28 UCx: Ecoli (UA contaminated)  1/29 resp panel - neg 1/29 BCx: ngtd   2/28, PharmD PGY1 Pharmacy Resident [employer]: [phone]  05/23/2019 12:15 PM

## 2019-05-23 NOTE — Progress Notes (Signed)
Echocardiogram Echocardiogram Transesophageal has been performed.  Christy Pearson 05/23/2019, 8:23 AM

## 2019-05-23 NOTE — Anesthesia Postprocedure Evaluation (Signed)
Anesthesia Post Note  Patient: Christy Pearson Patient  Procedure(s) Performed: TRANSESOPHAGEAL ECHOCARDIOGRAM (TEE) (N/A ) BUBBLE STUDY     Patient location during evaluation: PACU Anesthesia Type: MAC Level of consciousness: awake and alert and oriented Pain management: pain level controlled Vital Signs Assessment: post-procedure vital signs reviewed and stable Respiratory status: spontaneous breathing, nonlabored ventilation and respiratory function stable Cardiovascular status: blood pressure returned to baseline Postop Assessment: no apparent nausea or vomiting Anesthetic complications: no    Last Vitals:  Vitals:   05/23/19 0707 05/23/19 0819  BP: (!) 122/91 116/80  Pulse: 79 88  Resp: 16 19  Temp: 37.4 C 37.6 C  SpO2: 97% 99%    Last Pain:  Vitals:   05/23/19 0819  TempSrc: Oral  PainSc:                  Brennan Bailey

## 2019-05-23 NOTE — Progress Notes (Signed)
Pt was brought to MRI for exam(s)  Pt has several orders pending which is going to take about 7 hours to complete.  Pt was unable to cooperate for even 30 minutes of imaging.  Pt kept moving while she was in the scanner and after 30 minutes aborted the first procedure saying she was claustrophobic and couldn't take it anymore.  I tried calling her RN several times and did not get an answer on her phone.  Unsure of what the next step is if they want diagnostic images perhaps anesthesia is warranted in this case.

## 2019-05-23 NOTE — Progress Notes (Signed)
MRI called with patient's results to right shoulder. Dr. Jomarie Longs text paged via Amion system at this time.

## 2019-05-23 NOTE — Plan of Care (Signed)
Continue to monitor

## 2019-05-23 NOTE — Progress Notes (Addendum)
PROGRESS NOTE  Cherokee Boccio ZOX:[id number] DOB: 08/28/1990 DOA: 05/18/2019 PCP: Patient, No Pcp Per  Brief History   The patient is a 29 yr old woman with a medical history significant for hepatitis C, IV heroin, crystal meth abuse, and kidney stone. She stated using about 14 mg methadone from the street currently. She is on 30 mg daily here. She was transferred to [facility] for investigation of bacteremia and endocarditis, and for severe sepsis. She is found to have cavitary pulmonary lesions that likely represent septic emboli, 2/2 blood cultures drawn on 05/18/2019 which have grown out MRSA. She also has significant pain in her thoracic spine, left shoulder, wrist and pelvis. 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 Sepsis related -h/o Hep C, but would not expect this to worsen -Abdominal exam is benign, monitor -?  Passive congestion of the liver from TR -Liver ultrasound was benign, continue to monitor  UTI -Urine culture grew E. coli, completed ceftriaxone course for this 2/1  Anemia -Secondary to chronic disease and iron deficiency -Transfused 1 unit of PRBC this admission -Give IV iron x1 -Monitor   History of hepatitis C.  Active IV drug abuse -Currently using crystal meth -Continue  methadone  DVT prophylaxis:  lovenox Code Status: Full Code Family Communication: None available Disposition Plan: Work up for suspected widespread osteomyelitis and tricuspid endocarditis. Further disposition will depend on results of work up and plans for treatment. Due to the patient's history of heroin abuse, I doubt that outpatient treatment of her MRSA bacteremia/endocarditis/osteomyelitis would be appropriate.       Consultants  . Infectious disease . Cardiology  Procedures  . Transfusion of one unit of PRBC's.  Antibiotics   Anti-infectives (From admission, onward)   Start     Dose/Rate Route Frequency Ordered Stop   05/21/19 1000  vancomycin (VANCOCIN) IVPB 1000 mg/200 mL premix     1,000 mg 200 mL/hr over 60 Minutes Intravenous Every 12 hours 05/21/19 0948     05/20/19 1400  cefTRIAXone (ROCEPHIN) 2 g in sodium chloride 0.9 % 100 mL IVPB  Status:  Discontinued     2 g 200 mL/hr over 30 Minutes Intravenous Every 24 hours 05/20/19 1328 05/22/19 0947   05/19/19 2200  levofloxacin (LEVAQUIN) IVPB 750 mg  Status:  Discontinued     750 mg 100 mL/hr over 90 Minutes Intravenous Every 24 hours 05/19/19 0212 05/19/19 0213   05/19/19 2200  levofloxacin (LEVAQUIN) IVPB 500 mg  Status:  Discontinued     500 mg 100 mL/hr over 60 Minutes Intravenous Every 24 hours 05/19/19 0213 05/19/19 0539   05/19/19 1000  vancomycin (VANCOREADY) IVPB 750 mg/150 mL  Status:  Discontinued     750 mg 150 mL/hr over 60 Minutes Intravenous Every 12 hours 05/19/19 0539 05/21/19 0948   05/19/19 0600  ceFEPIme (MAXIPIME) 2 g in sodium chloride 0.9 % 100  mL IVPB  Status:  Discontinued     2 g 200 mL/hr over 30 Minutes Intravenous Every 8 hours 05/19/19 0539 05/19/19 2348   05/19/19 0130  vancomycin (VANCOCIN) IVPB 1000 mg/200 mL premix     1,000 mg 200 mL/hr over 60 Minutes Intravenous  Once 05/19/19 0122 05/19/19 0253   05/18/19 2345  levofloxacin (LEVAQUIN) IVPB 750 mg     750 mg 100 mL/hr over 90  Minutes Intravenous  Once 05/18/19 2338 05/19/19 0140     Subjective  -Just back from TEE, is a little drowsy  -Complains of pain all over, primarily in her shoulders  Objective   Vitals:  Vitals:   05/23/19 1200 05/23/19 1300  BP:    Pulse: 83 95  Resp: (!) 23 (!) 21  Temp:    SpO2: 96% 95%   Exam:  Gen: Little drowsy, slightly uncomfortable, resting in bed, just back from TEE HEENT: No JVD, no icterus Lungs: Good air movement bilaterally, CTAB CVS: S1-S2, regular rhythm, tachycardic, diastolic murmur Abd: soft, Non tender, non distended, BS present Extremities: Right shoulder tender with decreased range of motion, some tenderness in her left shoulder and left wrist as well Skin: no new rashes    I have personally reviewed the following:   Today's Data  . T Max 24, Vitals, CMP, CBC  Micro Data  . Blood cultures x 2 from 05/18/2019 MRSA . Blood cultures x 2 from 05/18/2019 - No growth . Urine Culture 05/18/2019 - E. coli  Imaging  . CT renal stone study: Multiple cavitary and non-cavitary pulmonary lesions consistent with septic emboli. . MRI pending  Cardiology Data  . Echocardiogram 05/19/2019 demonstrated severe tricuspid regurgitation and abnormalities suspicious for valvular vegetation due to endocarditis. . TEE: Report is pending.  Scheduled Meds: . ferrous sulfate  325 mg Oral BID  . methadone  15 mg Oral Daily  . prenatal multivitamin  1 tablet Oral Daily   Continuous Infusions: . sodium chloride 100 mL/hr at 05/23/19 0742  . vancomycin 1,000 mg (05/23/19 1217)    Principal Problem:   MRSA bacteremia Active Problems:   Injection of illicit drug within last 12 months   Hepatitis C antibody positive, s/p spontaneously cleared infection   Sepsis ([place])   Septic pulmonary embolism ([place])   Substance use disorder   Suspected endocarditis   IV drug abuse ([place])   Septic embolism ([facility])   Staphylococcal arthritis of right shoulder ([facility])   Staphylococcal  arthritis of left wrist ([place])   LOS: 4 days   A & P  Domenic Polite, MD [facility]  05/22/2019, 12:29 PM  LOS: 3 days

## 2019-05-23 NOTE — Consult Note (Addendum)
[address]       [place] [address]             [phone]        Jesslyn [place] Medical Record #[id number] Date of Birth: 08-13-1990  Referring: Dr. Virgina Jock, MD Primary Care: Patient, No Pcp Per   Chief Complaint:    Chief Complaint  Patient presents with  . Fever  . 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 According to medical records, patient states she fell down some stairs the day prior to admission. She has a history of heroin abuse and states she last used the day before coming to the ED. She was febrile and tachycardic upon admission. She was put on Vancomycin and Cefepime initially for sepsis. She was also given Ceftriaxone for E. Coli on urine culture. CXR showed patchy bilateral airspace disease concerning for pneumonia. CT of the abdomen and pelvis showed numerous bilateral cavitary and non cavitary pulmonary nodules. This was concerning for septic emboli or atypical/fungal infection, small pericardial effusion, splenomegaly, and no renal or ureteral stones or hydronephrosis. US abdomen showed no gallstones or acute gallbladder disease. Echo done 05/19/2019 showed LVEF 60-65%, mild MR, TV regurgitation moderate to severe with the anterior leaflet thickened and suspicious for endocarditis, although a distinct vegetation was not seen. TEE showed probable PFO, 1X0.5 cm vegetations on at least two tricuspid leaflet, and severe TR. Of note, because of her complaints of pain in numerous joints, an MRI of the thoracic and lumbar spine and shoulder has been ordered as well. Infectious disease was consulted and as of today, she is on Vancomycin for MRSA. Dr. Kipp Brood has been  consulted regarding TV endocarditis and probable PFO. Of note, she is COVID negative.  Current Activity/ Functional Status: Patient is independent with mobility/ambulation, transfers, ADL's, IADL's.   Zubrod Score: At the time of surgery this patient's most appropriate activity status/level should be described as: []     0    Normal activity, no symptoms [x]     1    Restricted in physical strenuous activity but ambulatory, able to do out light work []     2    Ambulatory and capable of self care, unable to do work activities, up and about more than 50%  Of the time                            []     3    Only limited self care, in bed greater than 50% of waking hours []     4    Completely disabled, no self care, confined to bed or chair []     5    Moribund  Past Medical History:  Diagnosis Date  . Hepatitis C   . Heroin abuse ([place])   . Kidney stones     Past Surgical History:  Procedure Laterality Date  . CESAREAN SECTION  02/02/2012    Social History   Tobacco Use  Smoking Status Current Every Day Smoker  . Packs/day: 1.00  . Years: 5.00  . Pack years: 5.00  . Types: Cigarettes  Smokeless Tobacco Never Used  Social History   Substance and Sexual Activity  Alcohol Use No  Patient is not employed and lives with her 3 children (ages 57 and 3 year old twins) and mother. She is not married and does not have a boyfriend.   Allergies  Allergen Reactions  . Tylenol [Acetaminophen] Rash    Pt just reported tylenol allergy at this visit at 2338pm  . Penicillins     Current Facility-Administered Medications  Medication Dose Route Frequency Provider Last Rate Last Admin  . 0.9 %  sodium chloride infusion   Intravenous Continuous Georgette Shell, MD 100 mL/hr at 05/23/19 8127 Continued from Pre-op at 05/23/19 0742  . ferrous sulfate tablet 325 mg  325 mg Oral BID Cristescu, Linard Millers, MD   Stopped at 05/22/19 2314  . ibuprofen (ADVIL) tablet 400 mg  400 mg Oral Q8H PRN  Donnamae Jude, RPH   400 mg at 05/21/19 5170   Or  . ketorolac (TORADOL) 15 MG/ML injection 15 mg  15 mg Intravenous Q6H PRN Donnamae Jude, RPH   15 mg at 05/23/19 0351  . LORazepam (ATIVAN) tablet 1 mg  1 mg Oral Once PRN Swayze, Ava, DO      . methadone (DOLOPHINE) tablet 15 mg  15 mg Oral Daily Dana Allan I, MD   15 mg at 05/22/19 0945  . ondansetron (ZOFRAN) tablet 4 mg  4 mg Oral Q6H PRN Cristescu, Mircea G, MD       Or  . ondansetron (ZOFRAN) injection 4 mg  4 mg Intravenous Q6H PRN Cristescu, Mircea G, MD      . prenatal multivitamin tablet 1 tablet  1 tablet Oral Daily Cristescu, Linard Millers, MD   1 tablet at 05/22/19 0945  . vancomycin (VANCOCIN) IVPB 1000 mg/200 mL premix  1,000 mg Intravenous Joan Mayans, [facility]   Stopped at 05/22/19 2309    Medications Prior to Admission  Medication Sig Dispense Refill Last Dose  . methadone (DOLOPHINE) 10 MG tablet Take 55 mg by mouth daily. Pt states this was from an old rx.   Past Week at Unknown time  . ferrous sulfate (FERROUSUL) 325 (65 FE) MG tablet Take 1 tablet (325 mg total) by mouth 2 (two) times daily. (Patient not taking: Reported on 05/19/2019) 120 tablet 4 Not Taking at Unknown time  . ibuprofen (ADVIL) 600 MG tablet Take 600 mg by mouth every 6 (six) hours as needed.   Not Taking at Unknown time  . Magnesium Salicylate 017 MG TABS Take 325 mg by mouth daily.   Not Taking at Unknown time  . Prenatal Multivit-Min-Fe-FA (PRENATAL VITAMINS) 0.8 MG tablet Take 1 tablet by mouth daily. (Patient not taking: Reported on 05/19/2019) 180 tablet 1 Not Taking at Unknown time   Family History: Parents are both alive, in their 58's and have a history of drug and alcohol abuse  Review of Systems:     Cardiac Review of Systems: Y or  [ N   ]= no  Chest Pain [  N  ] Pedal Edema [  N ]   Syncope  [ N ]   Presyncope [  N ]  General Review of Systems: [Y] = yes [N  ]=no Constitional:  nausea [ N ];fever [Y  ]; fatigue [Y]  Eye : Amaurosis fugax [ N ]; Resp: cough [ N ];  wheezing[N  ];  hemoptysis[N  ]; shortness of breath[N  ]; GI:  gallstones[N  ], vomiting[ N ];  dysphagia[ N ]; melena[ N];  hematochezia [N  ];  GU: kidney stones [not on this admission but has a history  ];              Skin: rash, swelling Aqua.Slicker  ]; Musculosketetal:  joint pain[ Y ];  back pain[ Y ];  Heme/Lymphanemia[Y  ];  Neuro: TIA[ N ] stroke[N  ];  vertigo[N  ];  seizures[N  ];      Endocrine: diabetes[ N ];  thyroid dysfunction[N  ];            Physical Exam: BP 100/68 (BP Location: Left Arm)   Pulse (!) 107   Temp 98.4 F (36.9 C) (Oral)   Resp 12   Ht 5' 7"  (1.702 m)   Wt 56.5 kg   LMP 04/27/2019 (Within Days)   SpO2 97%   BMI 19.51 kg/m    General appearance: alert and no distress Head: Normocephalic, without obvious abnormality, atraumatic Neck: no carotid bruit and supple, symmetrical, trachea midline Resp: clear to auscultation bilaterally Cardio: RRR, grade II/VI murmur GI: Soft, non tender, bowel sounds present Extremities: No LE edema;palpable DP/PT bilaterally Neurologic: Grossly normal Musculoskeletal exam not done as patient was in pain and preferred me not to examine at this time  Diagnostic Studies & Laboratory data:     Recent Radiology Findings:    CLINICAL DATA:  Sepsis, pain  EXAM: PORTABLE CHEST 1 VIEW  COMPARISON:  06/14/2014  FINDINGS: Patchy airspace opacities noted in both lungs, right greater than left with somewhat peripheral predilection. No effusions. Heart is normal size. No acute bony abnormality.  IMPRESSION: Patchy bilateral airspace disease concerning for pneumonia.   Electronically Signed   By: Rolm Baptise M.D.   On: 05/19/2019 00:31   CLINICAL DATA:  Back pain.  History of hepatitis-C.  Kidney stones.  EXAM: CT ABDOMEN AND PELVIS WITHOUT CONTRAST  TECHNIQUE: Multidetector CT imaging of the abdomen and pelvis was performed following  the standard protocol without IV contrast.  COMPARISON:  06/13/2017  FINDINGS: Lower chest: Numerous bilateral pulmonary nodules in the visualized lower lungs, many of which are cavitary. Appearance is concerning for septic emboli or atypical infection. Heart is normal size. Small pericardial effusion. No pleural effusions.  Hepatobiliary: No focal hepatic abnormality. Gallbladder unremarkable.  Pancreas: No focal abnormality or ductal dilatation.  Spleen: Spleen is enlarged with a craniocaudal length of the spleen measuring 19.4 cm.  Adrenals/Urinary Tract: No renal or ureteral stones. No hydronephrosis.  Stomach/Bowel: Moderate stool burden throughout the colon. Stomach, large and small bowel grossly unremarkable.  Vascular/Lymphatic:  No evidence of aneurysm or adenopathy.  Reproductive: Uterus and adnexa unremarkable.  No mass.  Other: No free fluid or free air.  Musculoskeletal: No acute bony abnormality.  IMPRESSION: Numerous bilateral cavitary and non cavitary pulmonary nodules. This is concerning for septic emboli or atypical/fungal infection.  Splenomegaly.  Moderate stool burden throughout the colon.  No renal or ureteral stones.  No hydronephrosis.   Electronically Signed   By: Rolm Baptise M.D.   On: 05/19/2019 00:45   CLINICAL DATA:  Elevated LFTs  EXAM: ULTRASOUND ABDOMEN LIMITED RIGHT UPPER QUADRANT  COMPARISON:  CT 05/19/2019  FINDINGS: Gallbladder:  No shadowing stones. Wall thickness upper normal. Negative sonographic Murphy.  Common bile duct:  Diameter:  3.1 mm  Liver:  No focal lesion identified. Within normal limits in parenchymal echogenicity. Portal vein is patent on color Doppler imaging with normal direction of blood flow towards the liver.  Other: None.  IMPRESSION: 1. Negative for gallstones or acute gallbladder disease. 2. Ultrasound appearance of the liver is within normal  limits.   Electronically Signed   By: Donavan Foil M.D.   On: 05/19/2019 22:18       I have independently reviewed the above radiologic studies and discussed with the patient   Recent Lab Findings: Lab Results  Component Value Date   WBC 9.2 05/23/2019   HGB 7.4 (L) 05/23/2019   HCT 23.5 (L) 05/23/2019   PLT 324 05/23/2019   GLUCOSE 91 05/23/2019   ALT 112 (H) 05/23/2019   AST 158 (H) 05/23/2019   NA 136 05/23/2019   K 3.8 05/23/2019   CL 108 05/23/2019   CREATININE 0.55 05/23/2019   BUN 6 05/23/2019   CO2 20 (L) 05/23/2019   TSH 2.414 05/19/2019   INR 1.3 (H) 05/20/2019    Assessment / Plan:   1. MRSA TV endocarditis, numerous bilateral cavitary pulmonary nodules (septic emboli or atypical infection) and probable PFO. On Vancomycin forTV endocarditis.  Dr. Kipp Brood to determine if any surgical intervention necessary 2. Mildly elevated transaminases-ALT 112, AST 158, Alk phosphatase 159. She has a history of Hepatitis C 3. Anemia-H and H this am 7.4 and 23.5. She previously has receivecd a transfusion. On Ferrous sulfate. 4. History of IV drug abuse (heroin)  I  spent 10 minutes counseling the patient face to face.   Lars Pinks PA-C 05/23/2019 9:31 AM    Images reviewed and case discussed with patient Hx of IV drug use now with evidence of MRSA tricuspid valve endocarditis, severe TR, PFO, and septic arthritis.  Most recent use was 1 day prior to admission.  Most recent blood cultures are negative.  The vegetation is small, and likely affecting the anterior and posterior leaftlets.  Unfortunately due to the PFO, she is not a candidate for Angiovac debridement.  Due to the septic arthritis, she would not be a candidate for surgical valve replacement.  If we are able to clear extra-cardiac sources of infection, and obtain some degree of drug rehabilitation, we would discuss the option of surgical repair of her valve.    Recs: - Medical therapy, drug  rehabilitation, and re-evaluation upon completion of antibiotic course.  Kaveh Kissinger Bary Leriche

## 2019-05-23 NOTE — Progress Notes (Signed)
Patient back from procedure. V/s and assessment complete. Patient alert and oriented.  Patient ordered breakfast. Call bed within reach.

## 2019-05-23 NOTE — Progress Notes (Signed)
Pharmacy Antibiotic Note  Christy Pearson is a 29 y.o. female admitted on 05/18/2019 with MRSA Bacteremia and TV endocarditis (TEE on 2/2 showed vegetations on at least 2 leaflets of TV). CVTS was consulted. Pharmacy has been consulted for Vancomycin dosing.  Vancomycin levels pending for today. Renal function stable.   Plan: Vancomycin 1g IV Q12 hrs Monitor clinical progress, c/s, renal function F/u vancomycin levels today and adjust dose as appropriate F/u ID plans, CVTS recommendations   Height: 5\' 7"  (170.2 cm) Weight: 124 lb 9 oz (56.5 kg) IBW/kg (Calculated) : 61.6  Temp (24hrs), Avg:99.2 F (37.3 C), Min:98.4 F (36.9 C), Max:100.3 F (37.9 C)  Recent Labs  Lab 05/18/19 2252 05/18/19 2252 05/18/19 2338 05/19/19 0126 05/19/19 0728 05/20/19 0258 05/21/19 0049 05/21/19 0902 05/21/19 1035 05/22/19 0406 05/23/19 0536  WBC 10.3   < >  --   --   --  2.5* 8.0  --  10.5 11.5* 9.2  CREATININE 0.90  --    < >  --  0.87 0.61 0.62  --   --  0.65 0.55  LATICACIDVEN 2.4*  --   --  1.7  --   --   --   --   --   --   --   VANCOTROUGH  --   --   --   --   --   --   --  9*  --   --   --   VANCOPEAK  --   --   --   --   --   --  21*  --   --   --   --    < > = values in this interval not displayed.    Estimated Creatinine Clearance: 95 mL/min (by C-G formula based on SCr of 0.55 mg/dL).    Allergies  Allergen Reactions  . Tylenol [Acetaminophen] Rash    Pt just reported tylenol allergy at this visit at 2338pm  . Penicillins     Antimicrobials this admission: Vancomycin 1/29 >>  CTX 1/30 >> (2/1) Cefepime 1/29 x3 doses  Dose adjustments this admission: 1/31: VP 21, VT 9, AUC 366 - increase dose to 1000 mg Q12 hrs  Microbiology results: 1/28 BCx: MRSA 1/28 UCx: Ecoli (UA contaminated)  1/29 resp panel - neg 1/29 BCx: ngtd   2/29, PharmD, BCPS Please check AMION for all [facility] contact numbers Clinical Pharmacist 05/23/2019 11:38 AM

## 2019-05-23 NOTE — Progress Notes (Signed)
Grandmother updated w/ pt permission. Grandmother Royann Shivers) requests update from MD when possible.

## 2019-05-23 NOTE — Interval H&P Note (Signed)
History and Physical Interval Note:  05/23/2019 7:42 AM  Christy Pearson  has presented today for surgery, with the diagnosis of endocarditis.  The various methods of treatment have been discussed with the patient and family. After consideration of risks, benefits and other options for treatment, the patient has consented to  Procedure(s): TRANSESOPHAGEAL ECHOCARDIOGRAM (TEE) (N/A) as a surgical intervention.  The patient's history has been reviewed, patient examined, no change in status, stable for surgery.  I have reviewed the patient's chart and labs.  Questions were answered to the patient's satisfaction.     Kushi Kun J Melquan Ernsberger

## 2019-05-23 NOTE — CV Procedure (Signed)
TEE: Under moderate sedation, TEE was performed without complications: LV: Normal. Normal EF. RV: Normal LA: Normal. Left atrial appendage: Normal without thrombus. Normal function. Aneurysmal interatrial septum with probable PFO, weakly positive bubble study. RA: Normal  MV: Normal. Mild MR. No vegetation. TV: 1X0.5 cm vegetations on at least two tricuspid leaflets. Severe TR. AV: Normal. No AI or AS. No vegetation. PV: Normal. Trace PI. No vegetation.  Thoracic and ascending aorta: Normal without significant plaque or atheromatous changes.  Deep sedation administered and monitored by anesthesiology.   Impression: Tricuspid valve endocarditis with MRSA bacteremia, with likely paradoxical septic embolization (renal septic emboli). In addition to appropriate antibiotics, consider CVTS consultation.   Elder Negus, MD [facility]. PA Pager: [phone] Office: [phone]

## 2019-05-23 NOTE — Progress Notes (Signed)
Dr. Jomarie Longs at bedside to update patient's mother and patient on patient status and care plan.

## 2019-05-23 NOTE — Progress Notes (Signed)
[facility]  Date of Admission:  05/18/2019      Total days of antibiotics 6   Vancomycin, day 6            ASSESSMENT: Christy Pearson is a 29 y.o. 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 She has bilateral cavitary findings on chest CT without hypoxia. Dr. Cliffton Asters to evaluate for possible surgical intervention given remaining vegetation and possible PFO. ?candidacy for percutaneous debulking of valve.   Injection drug use with substance use disorder, she has been getting clean needles she uses only once with harm reduction/needle exchange clinic locally. 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   She has no Hepatitis C viral RNA detected indicating she has already spontaneously cleared without DAA therapy. Hep B surface antigen negative. Discussed results with her today.   Unable to tolerate MRIs - would like to prioritize b/l shoulders, left wrist and L-spine. D/W Dr. Jomarie Longs and Dr. Daiva Eves as well as radiology team - will modify order for anesthesia supported study. R shoulder study was completed without contrast and pending report read. She will probably need the help of orthopedic surgery team to gain control over what is likely septic arthritis +/- osteomyelitis of the shoulder and wrist.    PLAN: 1. Continue vancomycin  2. Follow up TCTS recs 3. Follow up MRIs L shoulder, L wrist and L-spine - orders changed to request anesthesia 4. Likely needs ortho consult pending #3   Principal Problem:   MRSA bacteremia Active Problems:   Septic pulmonary embolism (HCC)   Suspected endocarditis   Injection of illicit drug within last 12 months   Hepatitis C antibody positive in blood   Sepsis (HCC)   Substance use disorder   IV drug abuse (HCC)   Septic embolism (HCC)   Staphylococcal arthritis of right shoulder (HCC)   Staphylococcal arthritis of left wrist  (HCC)   . ferrous sulfate  325 mg Oral BID  . methadone  15 mg Oral Daily  . prenatal multivitamin  1 tablet Oral Daily     SUBJECTIVE: Still with intense lower back pain without leg weakness or altered sensation or bowel/bladder incontinence. Same pain in bilateral shoulders and wrist. She is able to raise her hips up and down on bed pan without much difficulty.   Reporting some intermittent loose stools but no associated nausea/cramping.   Febrile overnight with temp 100.3 F. WBC normalized. She is getting IV Toradol as part of pain regimen. Creatinine remains stable     Review of Systems: Review of Systems  Constitutional: Positive for fever and malaise/fatigue.  HENT: Negative for sore throat.   Respiratory: Negative for cough and shortness of breath.   Cardiovascular: Positive for chest pain.  Gastrointestinal: Negative for abdominal pain and diarrhea.  Genitourinary: Negative for dysuria.  Musculoskeletal: Positive for back pain and joint pain.  Skin: Negative for itching and rash.  Neurological: Negative for dizziness and headaches.    Allergies  Allergen Reactions  . Tylenol [Acetaminophen] Rash    Pt just reported tylenol allergy at this visit at 2338pm  . Penicillins     OBJECTIVE: Vitals:   05/23/19 0820 05/23/19 0830 05/23/19 0834 05/23/19 0909  BP: (!) 159/129 119/73 119/73 100/68  Pulse: 94 92 85 (!) 107  Resp: 18 17 12    Temp:    98.4 F (36.9 C)  TempSrc:  Oral  SpO2: 98% 98% 97% 97%  Weight:      Height:       Body mass index is 19.51 kg/m.  Physical Exam Constitutional:      Comments: Appears uncomfortably resting in bed.   HENT:     Mouth/Throat:     Mouth: Mucous membranes are moist.     Pharynx: Oropharynx is clear.  Eyes:     General: No scleral icterus. Cardiovascular:     Rate and Rhythm: Regular rhythm. Tachycardia present.     Heart sounds: Murmur present.  Abdominal:     General: Bowel sounds are normal. There is no  distension.  Musculoskeletal:     Right shoulder: Tenderness present. Decreased range of motion.     Left shoulder: Tenderness present. Decreased range of motion.     Left wrist: Swelling and tenderness present. Decreased range of motion.  Skin:    General: Skin is warm and dry.     Capillary Refill: Capillary refill takes less than 2 seconds.     Coloration: Skin is not jaundiced.  Neurological:     Mental Status: She is alert and oriented to person, place, and time.     Lab Results Lab Results  Component Value Date   WBC 9.2 05/23/2019   HGB 7.4 (L) 05/23/2019   HCT 23.5 (L) 05/23/2019   MCV 78.9 (L) 05/23/2019   PLT 324 05/23/2019    Lab Results  Component Value Date   CREATININE 0.55 05/23/2019   BUN 6 05/23/2019   NA 136 05/23/2019   K 3.8 05/23/2019   CL 108 05/23/2019   CO2 20 (L) 05/23/2019    Lab Results  Component Value Date   ALT 112 (H) 05/23/2019   AST 158 (H) 05/23/2019   ALKPHOS 159 (H) 05/23/2019   BILITOT 0.7 05/23/2019     Microbiology: Recent Results (from the past 240 hour(s))  Blood Culture (routine x 2)     Status: Abnormal   Collection Time: 05/18/19 10:52 PM   Specimen: BLOOD  Result Value Ref Range Status   Specimen Description   Final    BLOOD BLOOD LEFT ARM Performed at [facility], [address] [place], [place] [address]    Special Requests   Final    BOTTLES DRAWN AEROBIC AND ANAEROBIC Blood Culture adequate volume Performed at [facility], [address] [place], [place] [address]    Culture  Setup Time   Final    IN BOTH AEROBIC AND ANAEROBIC BOTTLES GRAM POSITIVE COCCI CRITICAL RESULT CALLED TO, READ BACK BY AND VERIFIED WITH: D[place] Brick [facility] 05/19/19 2254 JDW Performed at [facility], [place] [address] [place], [place] [address]    Culture METHICILLIN RESISTANT STAPHYLOCOCCUS AUREUS (A)  Final   Report Status 05/21/2019 FINAL  Final   Organism ID, Bacteria METHICILLIN RESISTANT STAPHYLOCOCCUS  AUREUS  Final      Susceptibility   Methicillin resistant staphylococcus aureus - MIC*    CIPROFLOXACIN <=0.5 SENSITIVE Sensitive     ERYTHROMYCIN >=8 RESISTANT Resistant     GENTAMICIN <=0.5 SENSITIVE Sensitive     OXACILLIN >=4 RESISTANT Resistant     TETRACYCLINE <=1 SENSITIVE Sensitive     VANCOMYCIN 1 SENSITIVE Sensitive     TRIMETH/SULFA <=10 SENSITIVE Sensitive     CLINDAMYCIN >=8 RESISTANT Resistant     RIFAMPIN <=0.5 SENSITIVE Sensitive     Inducible Clindamycin NEGATIVE Sensitive     * METHICILLIN  RESISTANT STAPHYLOCOCCUS AUREUS  Blood Culture ID Panel (Reflexed)     Status: Abnormal   Collection Time: 05/18/19 10:52 PM  Result Value Ref Range Status   Enterococcus species NOT DETECTED NOT DETECTED Final   Listeria monocytogenes NOT DETECTED NOT DETECTED Final   Staphylococcus species DETECTED (A) NOT DETECTED Final    Comment: CRITICAL RESULT CALLED TO, READ BACK BY AND VERIFIED WITH: G ABBOTT PHARMD 05/19/19 2254 JDW    Staphylococcus aureus (BCID) DETECTED (A) NOT DETECTED Final    Comment: Methicillin (oxacillin)-resistant Staphylococcus aureus (MRSA). MRSA is predictably resistant to beta-lactam antibiotics (except ceftaroline). Preferred therapy is vancomycin unless clinically contraindicated. Patient requires contact precautions if  hospitalized. CRITICAL RESULT CALLED TO, READ BACK BY AND VERIFIED WITH: G ABBOTT PHARMD 05/19/19 2254 JDW    Methicillin resistance DETECTED (A) NOT DETECTED Final    Comment: CRITICAL RESULT CALLED TO, READ BACK BY AND VERIFIED WITH: G ABBOTT PHARMD 05/19/19 2254 JDW    Streptococcus species NOT DETECTED NOT DETECTED Final   Streptococcus agalactiae NOT DETECTED NOT DETECTED Final   Streptococcus pneumoniae NOT DETECTED NOT DETECTED Final   Streptococcus pyogenes NOT DETECTED NOT DETECTED Final   Acinetobacter baumannii NOT DETECTED NOT DETECTED Final   Enterobacteriaceae species NOT DETECTED NOT DETECTED Final   Enterobacter cloacae  complex NOT DETECTED NOT DETECTED Final   Escherichia coli NOT DETECTED NOT DETECTED Final   Klebsiella oxytoca NOT DETECTED NOT DETECTED Final   Klebsiella pneumoniae NOT DETECTED NOT DETECTED Final   Proteus species NOT DETECTED NOT DETECTED Final   Serratia marcescens NOT DETECTED NOT DETECTED Final   Haemophilus influenzae NOT DETECTED NOT DETECTED Final   Neisseria meningitidis NOT DETECTED NOT DETECTED Final   Pseudomonas aeruginosa NOT DETECTED NOT DETECTED Final   Candida albicans NOT DETECTED NOT DETECTED Final   Candida glabrata NOT DETECTED NOT DETECTED Final   Candida krusei NOT DETECTED NOT DETECTED Final   Candida parapsilosis NOT DETECTED NOT DETECTED Final   Candida tropicalis NOT DETECTED NOT DETECTED Final    Comment: Performed at [facility], [address] [place], [place] [address]  Urine culture     Status: Abnormal   Collection Time: 05/18/19 11:38 PM   Specimen: In/Out Cath Urine  Result Value Ref Range Status   Specimen Description   Final    IN/OUT CATH URINE Performed at [facility] [place], [address] [place], [place] [address]    Special Requests   Final    NONE Performed at [facility], [address] [place], [place] [address]    Culture >=100,000 COLONIES/mL ESCHERICHIA COLI (A)  Final   Report Status 05/21/2019 FINAL  Final   Organism ID, Bacteria ESCHERICHIA COLI (A)  Final      Susceptibility   Escherichia coli - MIC*    AMPICILLIN >=32 RESISTANT Resistant     CEFAZOLIN >=64 RESISTANT Resistant     CEFTRIAXONE 0.5 SENSITIVE Sensitive     CIPROFLOXACIN <=0.25 SENSITIVE Sensitive     GENTAMICIN <=1 SENSITIVE Sensitive     IMIPENEM <=0.25 SENSITIVE Sensitive     NITROFURANTOIN <=16 SENSITIVE Sensitive     TRIMETH/SULFA <=20 SENSITIVE Sensitive     AMPICILLIN/SULBACTAM >=32 RESISTANT Resistant     PIP/TAZO 8 SENSITIVE Sensitive     * >=100,000 COLONIES/mL ESCHERICHIA COLI  SARS CORONAVIRUS 2 (TAT 6-24 HRS)  Nasopharyngeal In/Out Cath Urine     Status: None   Collection Time: 05/18/19 11:39 PM  Specimen: In/Out Cath Urine; Nasopharyngeal  Result Value Ref Range Status   SARS Coronavirus 2 NEGATIVE NEGATIVE Final    Comment: (NOTE) SARS-CoV-2 target nucleic acids are NOT DETECTED. The SARS-CoV-2 RNA is generally detectable in upper and lower respiratory specimens during the acute phase of infection. Negative results do not preclude SARS-CoV-2 infection, do not rule out co-infections with other pathogens, and should not be used as the sole basis for treatment or other patient management decisions. Negative results must be combined with clinical observations, patient history, and epidemiological information. The expected result is Negative. Fact Sheet for Patients: HairSlick.no Fact Sheet for Healthcare Providers: quierodirigir.com This test is not yet approved or cleared by the [place] FDA and  has been authorized for detection and/or diagnosis of SARS-CoV-2 by FDA under an Emergency Use Authorization (EUA). This EUA will remain  in effect (meaning this test can be used) for the duration of the COVID-19 declaration under Section 56 4(b)(1) of the Act, 21 U.S.C. section 360bbb-3(b)(1), unless the authorization is terminated or revoked sooner. Performed at [facility], [address], [place], [place] [address]   SARS Coronavirus 2 Ag (30 min TAT) - In/Out Cath Urine     Status: None   Collection Time: 05/18/19 11:40 PM   Specimen: In/Out Cath Urine; Nasal Swab  Result Value Ref Range Status   SARS Coronavirus 2 Ag NEGATIVE NEGATIVE Final    Comment: (NOTE) SARS-CoV-2 antigen NOT DETECTED.  Negative results are presumptive.  Negative results do not preclude SARS-CoV-2 infection and should not be used as the sole basis for treatment or other patient management decisions, including infection  control decisions,  particularly in the presence of clinical signs and  symptoms consistent with COVID-19, or in those who have been in contact with the virus.  Negative results must be combined with clinical observations, patient history, and epidemiological information. The expected result is Negative. Fact Sheet for Patients: https://sanders-williams.net/ Fact Sheet for Healthcare Providers: https://martinez.com/ This test is not yet approved or cleared by the [place] FDA and  has been authorized for detection and/or diagnosis of SARS-CoV-2 by FDA under an Emergency Use Authorization (EUA).  This EUA will remain in effect (meaning this test can be used) for the duration of  the COVID-19 de claration under Section 564(b)(1) of the Act, 21 U.S.C. section 360bbb-3(b)(1), unless the authorization is terminated or revoked sooner. Performed at [facility], [address] [place], [place] [address]   Blood Culture (routine x 2)     Status: Abnormal   Collection Time: 05/18/19 11:50 PM   Specimen: BLOOD  Result Value Ref Range Status   Specimen Description   Final    BLOOD BLOOD RIGHT ARM Performed at [facility], [address] [place], [place] [address]    Special Requests   Final    BOTTLES DRAWN AEROBIC AND ANAEROBIC Blood Culture adequate volume Performed at [facility], [address] [place], [place] [address]    Culture  Setup Time   Final    IN BOTH AEROBIC AND ANAEROBIC BOTTLES GRAM POSITIVE COCCI CRITICAL RESULT CALLED TO, READ BACK BY AND VERIFIED WITH: G ABBOTT PHARMD 05/19/19 2254 JDW    Culture (A)  Final    STAPHYLOCOCCUS AUREUS SUSCEPTIBILITIES PERFORMED ON PREVIOUS CULTURE WITHIN THE LAST 5 DAYS. Performed at [facility], [address], [place], [place] [address]    Report Status 05/21/2019 FINAL  Final  Culture, blood (single) w Reflex to ID Panel     Status: None (Preliminary result)   Collection  Time: 05/19/19  1:40 AM   Specimen: BLOOD LEFT ARM  Result Value Ref Range Status   Specimen Description BLOOD LEFT ARM  Final   Special Requests   Final    BOTTLES DRAWN AEROBIC AND ANAEROBIC Blood Culture adequate volume Performed at [facility], [address] [place], [place] [address]    Culture NO GROWTH 4 DAYS  Final   Report Status PENDING  Incomplete  Respiratory Panel by PCR     Status: None   Collection Time: 05/19/19  5:58 AM   Specimen: Nasopharyngeal Swab; Respiratory  Result Value Ref Range Status   Adenovirus NOT DETECTED NOT DETECTED Final   Coronavirus 229E NOT DETECTED NOT DETECTED Final    Comment: (NOTE) The Coronavirus on the Respiratory Panel, DOES NOT test for the novel  Coronavirus (2019 nCoV)    Coronavirus HKU1 NOT DETECTED NOT DETECTED Final   Coronavirus NL63 NOT DETECTED NOT DETECTED Final   Coronavirus OC43 NOT DETECTED NOT DETECTED Final   Metapneumovirus NOT DETECTED NOT DETECTED Final   Rhinovirus / Enterovirus NOT DETECTED NOT DETECTED Final   Influenza A NOT DETECTED NOT DETECTED Final   Influenza B NOT DETECTED NOT DETECTED Final   Parainfluenza Virus 1 NOT DETECTED NOT DETECTED Final   Parainfluenza Virus 2 NOT DETECTED NOT DETECTED Final   Parainfluenza Virus 3 NOT DETECTED NOT DETECTED Final   Parainfluenza Virus 4 NOT DETECTED NOT DETECTED Final   Respiratory Syncytial Virus NOT DETECTED NOT DETECTED Final   Bordetella pertussis NOT DETECTED NOT DETECTED Final   Chlamydophila pneumoniae NOT DETECTED NOT DETECTED Final   Mycoplasma pneumoniae NOT DETECTED NOT DETECTED Final    Comment: Performed at [facility], [address], [place], [place] [address]  Culture, blood (routine x 2)     Status: None (Preliminary result)   Collection Time: 05/19/19  7:15 AM   Specimen: BLOOD  Result Value Ref Range Status   Specimen Description BLOOD LEFT ANTECUBITAL  Final   Special Requests   Final    BOTTLES DRAWN AEROBIC ONLY  Blood Culture results may not be optimal due to an inadequate volume of blood received in culture bottles Performed at [facility], [address], [place], [place] [address]    Culture NO GROWTH 4 DAYS  Final   Report Status PENDING  Incomplete  Culture, blood (routine x 2)     Status: None (Preliminary result)   Collection Time: 05/19/19  7:20 AM   Specimen: BLOOD RIGHT HAND  Result Value Ref Range Status   Specimen Description BLOOD RIGHT HAND  Final   Special Requests   Final    BOTTLES DRAWN AEROBIC ONLY Blood Culture results may not be optimal due to an inadequate volume of blood received in culture bottles Performed at [facility], [address] [place], [place] [address]    Culture NO GROWTH 4 DAYS  Final   Report Status PENDING  Incomplete     Rexene Alberts, MSN, NP-C [facility]  [email or web address] Pager: [phone] Office: [phone] [facility] Main Line: [phone]

## 2019-05-23 NOTE — Transfer of Care (Signed)
Immediate Anesthesia Transfer of Care Note  Patient: Christy Pearson  Procedure(s) Performed: TRANSESOPHAGEAL ECHOCARDIOGRAM (TEE) (N/A ) BUBBLE STUDY  Patient Location: Endoscopy Unit  Anesthesia Type:MAC  Level of Consciousness: awake, drowsy and patient cooperative  Airway & Oxygen Therapy: Patient Spontanous Breathing and Patient connected to nasal cannula oxygen  Post-op Assessment: Report given to RN, Post -op Vital signs reviewed and stable and Patient moving all extremities X 4  Post vital signs: Reviewed and stable  Last Vitals:  Vitals Value Taken Time  BP 159/129 05/23/19 0819  Temp    Pulse 94 05/23/19 0819  Resp 18 05/23/19 0819  SpO2 98 % 05/23/19 0819  Vitals shown include unvalidated device data.  Last Pain:  Vitals:   05/23/19 0707  TempSrc: Oral  PainSc: 9       Patients Stated Pain Goal: 2 (09/32/35 5732)  Complications: No apparent anesthesia complications

## 2019-05-24 ENCOUNTER — Inpatient Hospital Stay (HOSPITAL_COMMUNITY): Payer: Self-pay | Admitting: Certified Registered Nurse Anesthetist

## 2019-05-24 ENCOUNTER — Inpatient Hospital Stay (HOSPITAL_COMMUNITY): Payer: Self-pay

## 2019-05-24 ENCOUNTER — Encounter (HOSPITAL_COMMUNITY)
Admission: EM | Disposition: A | Discharge status: Home / Self Care | Payer: Self-pay | Source: Home / Self Care | Attending: Internal Medicine

## 2019-05-24 ENCOUNTER — Encounter (HOSPITAL_COMMUNITY): Payer: Self-pay | Admitting: Emergency Medicine

## 2019-05-24 HISTORY — PX: RADIOLOGY WITH ANESTHESIA: SHX6223

## 2019-05-24 LAB — CBC
HCT: 28.3 % — ABNORMAL LOW (ref 36.0–46.0)
Hemoglobin: 8.9 g/dL — ABNORMAL LOW (ref 12.0–15.0)
MCH: 25 pg — ABNORMAL LOW (ref 26.0–34.0)
MCHC: 31.4 g/dL (ref 30.0–36.0)
MCV: 79.5 fL — ABNORMAL LOW (ref 80.0–100.0)
Platelets: 410 10*3/uL — ABNORMAL HIGH (ref 150–400)
RBC: 3.56 MIL/uL — ABNORMAL LOW (ref 3.87–5.11)
RDW: 19.4 % — ABNORMAL HIGH (ref 11.5–15.5)
WBC: 7.9 10*3/uL (ref 4.0–10.5)
nRBC: 0 % (ref 0.0–0.2)

## 2019-05-24 LAB — CULTURE, BLOOD (SINGLE)
Culture: NO GROWTH
Special Requests: ADEQUATE

## 2019-05-24 LAB — COMPREHENSIVE METABOLIC PANEL
ALT: 141 U/L — ABNORMAL HIGH (ref 0–44)
AST: 161 U/L — ABNORMAL HIGH (ref 15–41)
Albumin: 1.4 g/dL — ABNORMAL LOW (ref 3.5–5.0)
Alkaline Phosphatase: 173 U/L — ABNORMAL HIGH (ref 38–126)
Anion gap: 8 (ref 5–15)
BUN: 7 mg/dL (ref 6–20)
CO2: 23 mmol/L (ref 22–32)
Calcium: 8.1 mg/dL — ABNORMAL LOW (ref 8.9–10.3)
Chloride: 112 mmol/L — ABNORMAL HIGH (ref 98–111)
Creatinine, Ser: 0.53 mg/dL (ref 0.44–1.00)
GFR calc Af Amer: >60 mL/min (ref >60)
GFR calc non Af Amer: >60 mL/min (ref >60)
Glucose, Bld: 89 mg/dL (ref 70–99)
Potassium: 4.3 mmol/L (ref 3.5–5.1)
Sodium: 143 mmol/L (ref 135–145)
Total Bilirubin: 0.5 mg/dL (ref 0.3–1.2)
Total Protein: 6 g/dL — ABNORMAL LOW (ref 6.5–8.1)

## 2019-05-24 LAB — CULTURE, BLOOD (ROUTINE X 2)
Culture: NO GROWTH
Culture: NO GROWTH

## 2019-05-24 IMAGING — MR MR WRIST*L* WO/W CM
4 of 6 series · 18 of 40 positions shown · IV contrast (gadavist)
Comparison: None.

CLINICAL DATA: Left wrist pain. Sepsis. Concern for septic
arthritis

EXAM:
MR OF THE LEFT WRIST WITHOUT AND WITH CONTRAST
TECHNIQUE: Multiplanar multisequence MR imaging of the left wrist was performed
both before and after the administration of intravenous contrast.
CONTRAST:  5.5mL GADAVIST GADOBUTROL 1 MMOL/ML IV SOLN

[Series 11: T2 fat-sat · axial · 3.0mm · 0.21mm/px · z∈[-125,-57]mm · 4 of 20 slices shown]
[im 1/20]
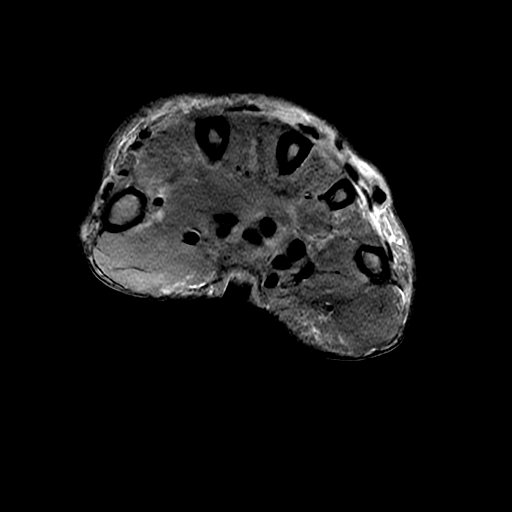
[im 4/20]
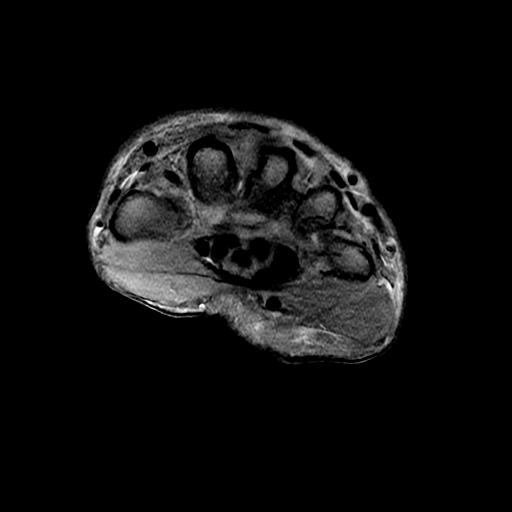
[im 12/20]
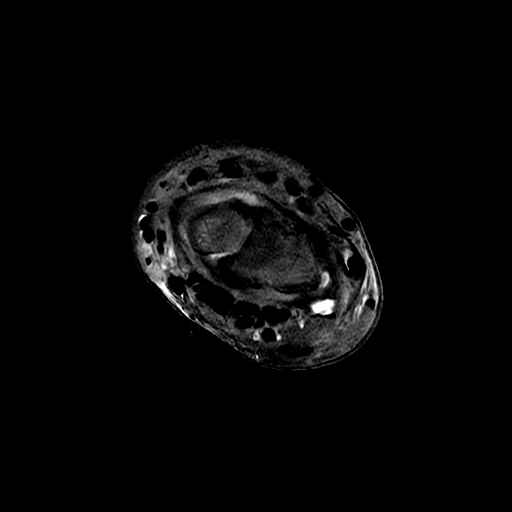
[im 20/20]
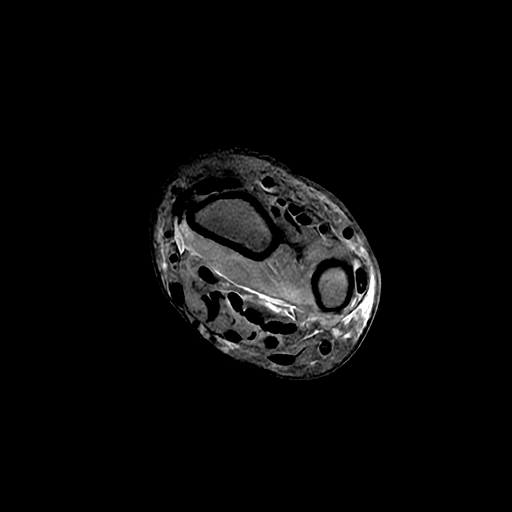

[Series 12: T1 · axial · 3.0mm · 0.21mm/px · z∈[-114,-71]mm · 3 of 20 slices shown (1 of 2)]
[im 4/20]
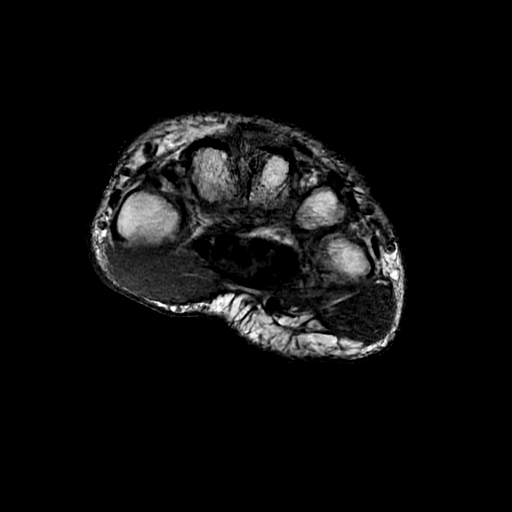
[im 10/20]
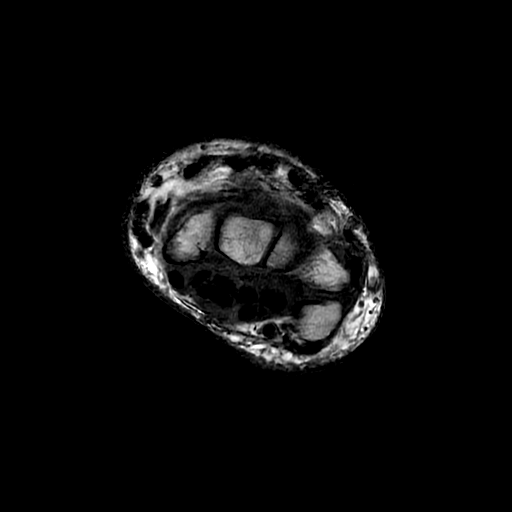
[im 16/20]
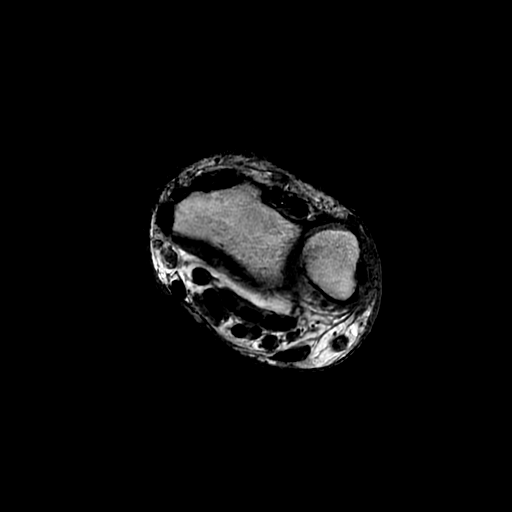

[Series 13: T1 · coronal · 3.0mm · 0.21mm/px · 3 of 18 slices shown (2 of 2)]
[im 4/18]
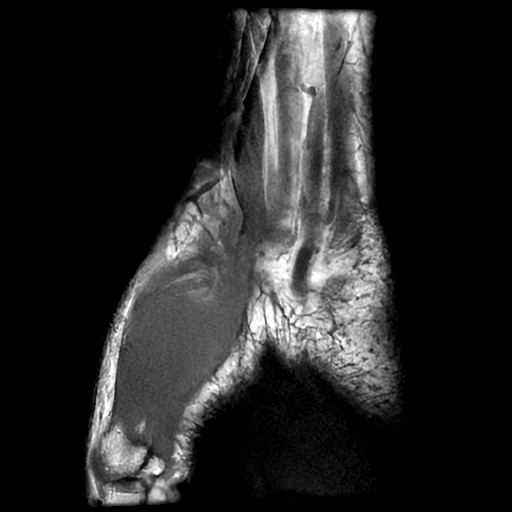
[im 11/18]
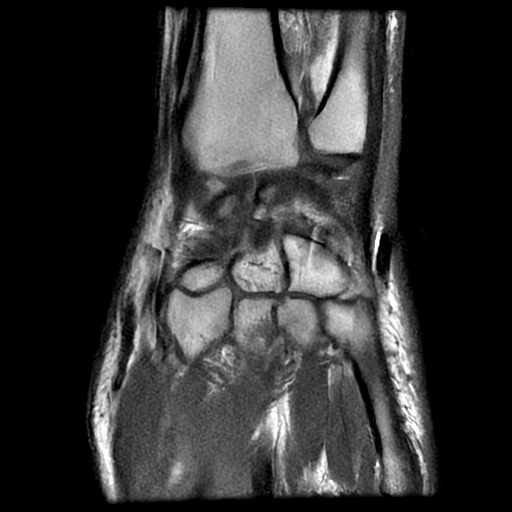
[im 18/18]
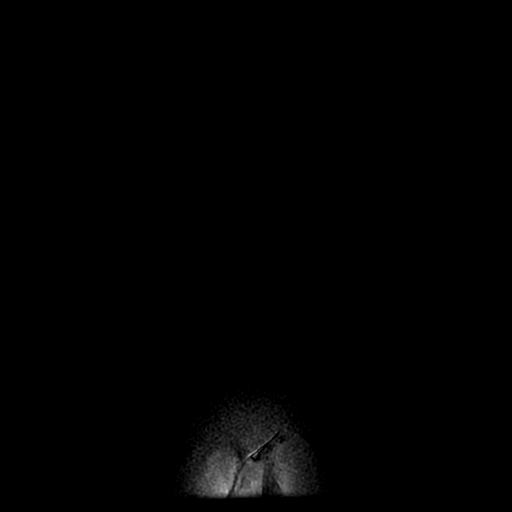

[Series 17: PD fat-sat · sagittal · 2.5mm · 0.20mm/px · 8 of 23 slices shown]
[im 1/23]
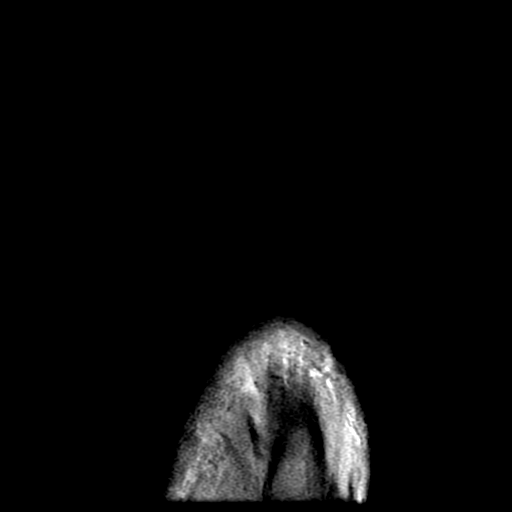
[im 4/23]
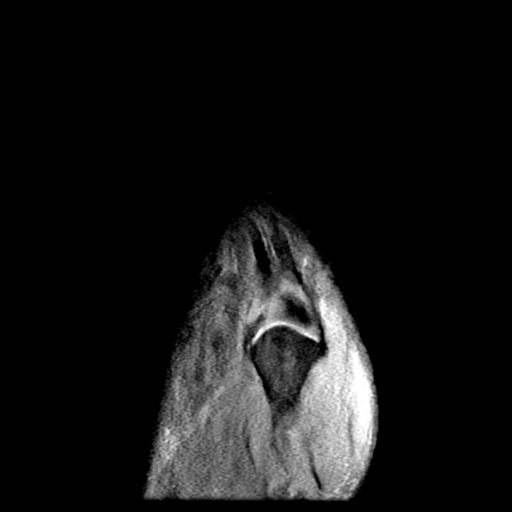
[im 7/23]
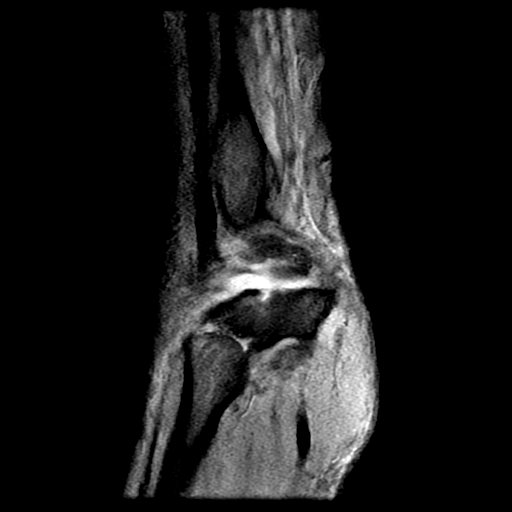
[im 10/23]
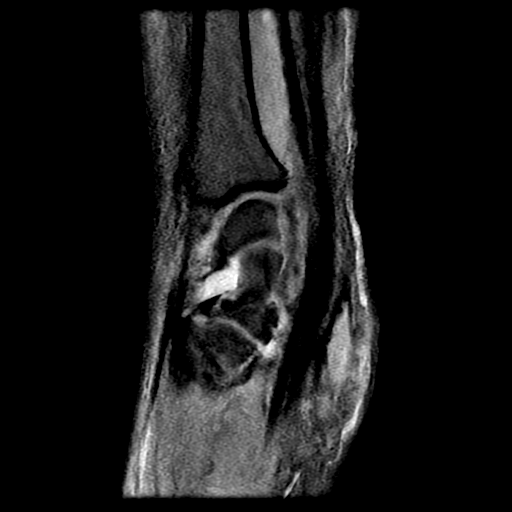
[im 13/23]
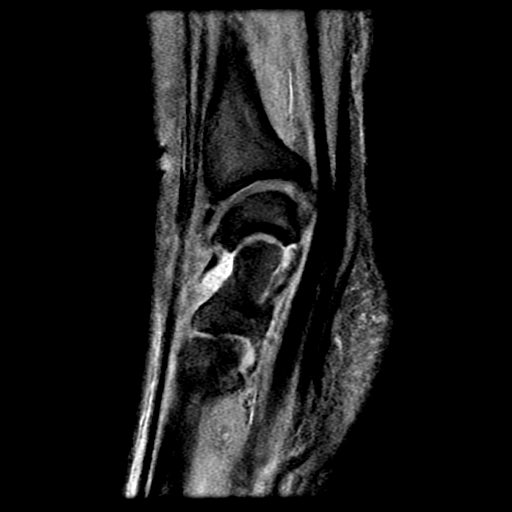
[im 16/23]
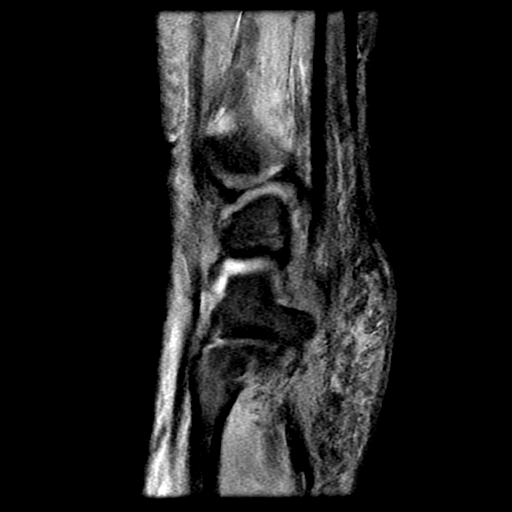
[im 19/23]
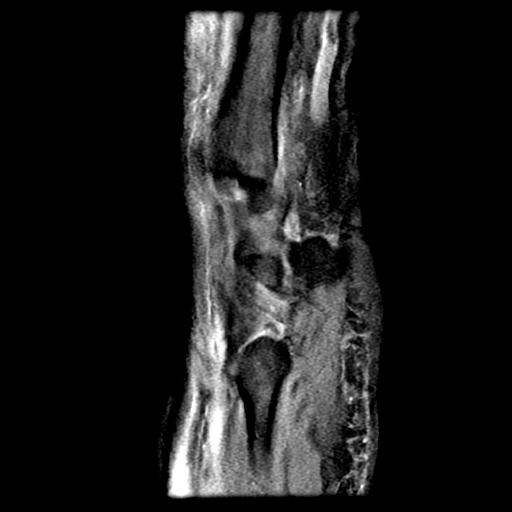
[im 23/23]
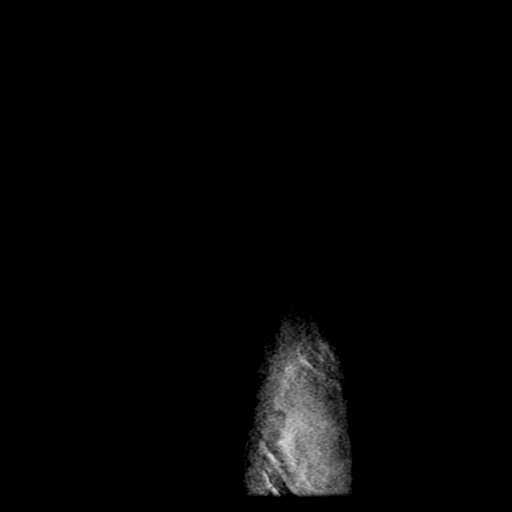

[18 of 40 positions shown; findings below may reference images not displayed]

FINDINGS: Technical note: Examination is mildly degraded by motion artifact.

Ligaments: Scapholunate ligament is grossly intact.

Triangular fibrocartilage: Heterogeneity of the articular disc near
the radial attachment (series 15, image 11) with a small amount of
fluid in the distal radioulnar joint raising the suspicion for a
small TFC perforation.

Tendons: Mild tendinosis and tenosynovitis of the extensor carpi
ulnaris tendon at the level of the ulnar styloid (series 21, image
19). Extensor tendons appear otherwise intact. Flexor tendons appear
intact.

Carpal tunnel/median nerve: Unremarkable.

Guyon's canal: Unremarkable.

Joint: Midcarpal joint effusion with synovitis and enhancement. No
joint fluid within the proximal carpal row.

Bones/carpal alignment: No acute fracture. There is preservation of
the T1 fatty marrow signal without evidence of osteomyelitis.
Incidental note of lunotriquetral coalition. Osseous alignment is
maintained without dislocation.

Other: Mild soft tissue edema without a well-defined or rim
enhancing fluid collection.
IMPRESSION: 1. Midcarpal joint effusion with synovitis and enhancement. Findings
can be seen in the setting of inflammatory or infectious
arthropathy. Arthrocentesis is recommended to evaluate for septic
arthritis.
2. No evidence of osteomyelitis.
3. Mild tendinosis and tenosynovitis of the ECU tendon at the level
of the ulnar styloid.
4. Heterogeneity of the articular disc near the radial attachment
with a small amount of fluid in the distal radioulnar joint raising
the suspicion for a small TFC perforation.
5. Lunotriquetral coalition incidentally noted.

These results will be called to the ordering clinician or
representative by the Radiologist Assistant, and communication
documented in the PACS or zVision Dashboard.

## 2019-05-24 IMAGING — MR MR SHOULDER*L* WO/W CM
4 of 8 series · 18 of 40 positions shown · IV contrast (gadavist)
Comparison: None.

CLINICAL DATA: Left shoulder pain, sepsis. Concern for septic
arthritis

EXAM:
MRI OF THE LEFT SHOULDER WITHOUT AND WITH CONTRAST
TECHNIQUE: Multiplanar, multisequence MR imaging of the left shoulder was
performed before and after the administration of intravenous
contrast.
CONTRAST:  5.5mL GADAVIST GADOBUTROL 1 MMOL/ML IV SOLN

[Series 2: PD fat-sat · axial · 4.0mm · 0.31mm/px · z∈[+25,+117]mm · 5 of 21 slices shown (1 of 2)]
[im 1/21]
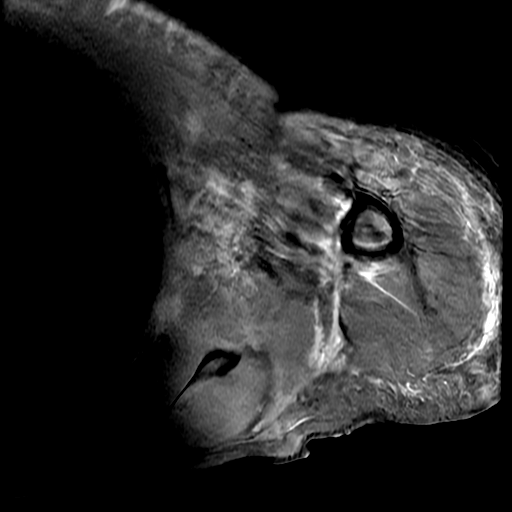
[im 6/21]
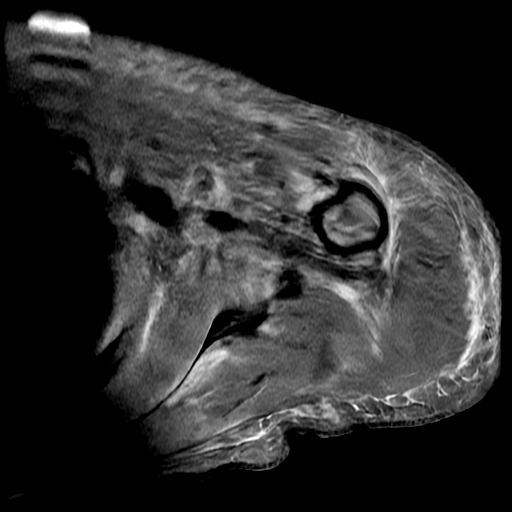
[im 11/21]
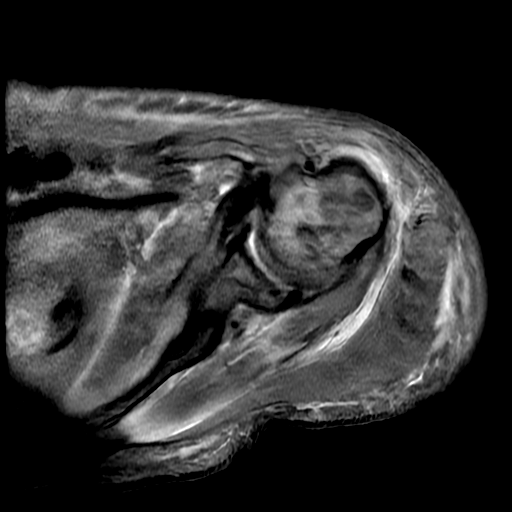
[im 16/21]
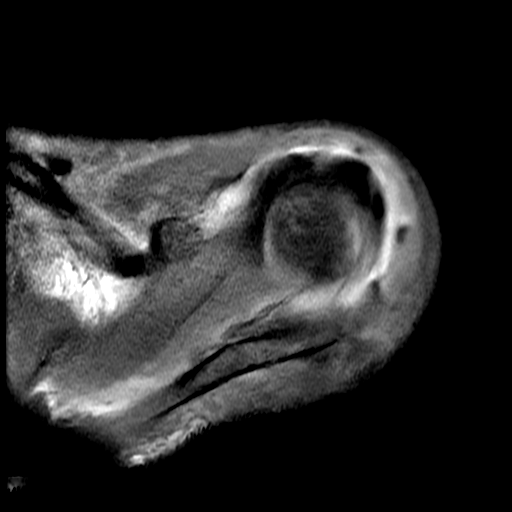
[im 21/21]
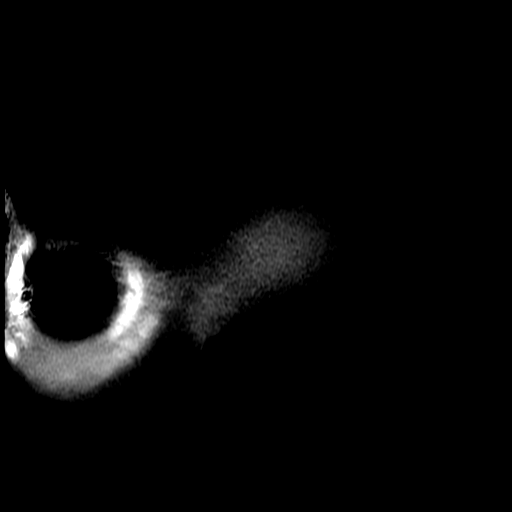

[Series 3: T2 fat-sat · coronal · 4.0mm · 0.31mm/px · 5 of 21 slices shown (1 of 2)]
[im 1/21]
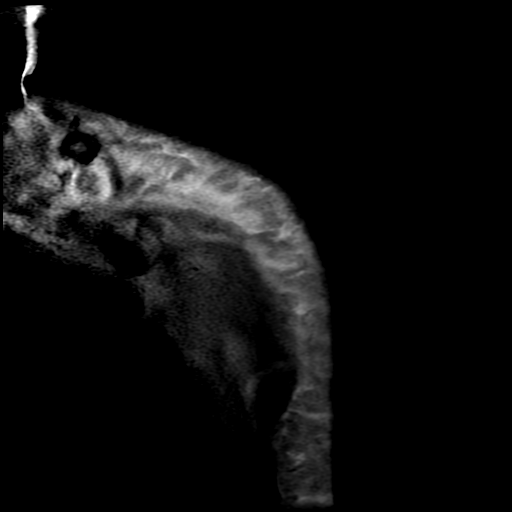
[im 6/21]
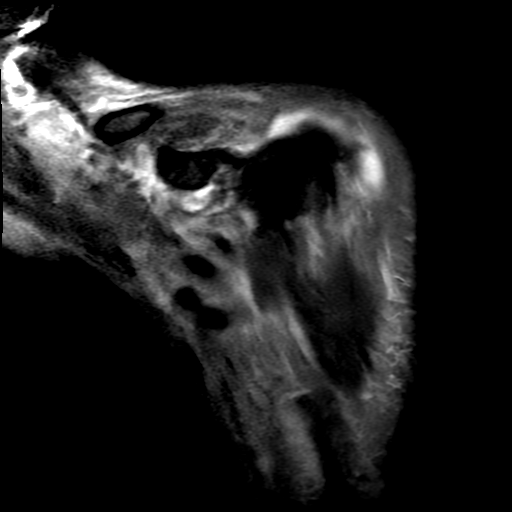
[im 11/21]
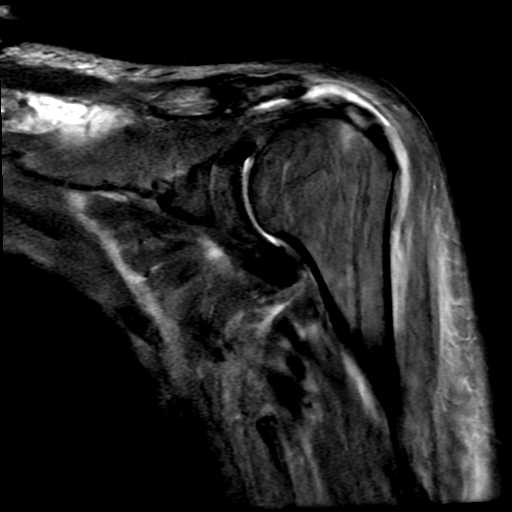
[im 16/21]
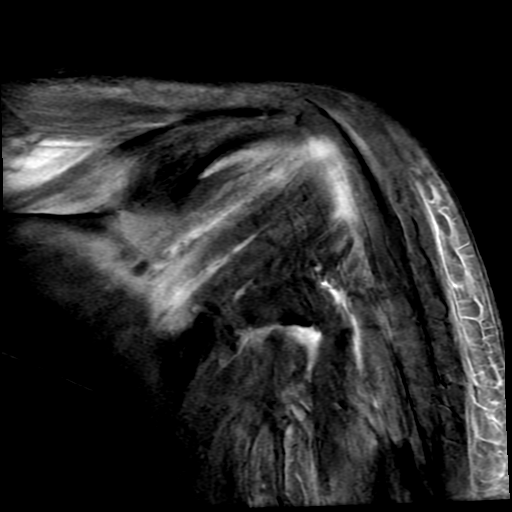
[im 21/21]
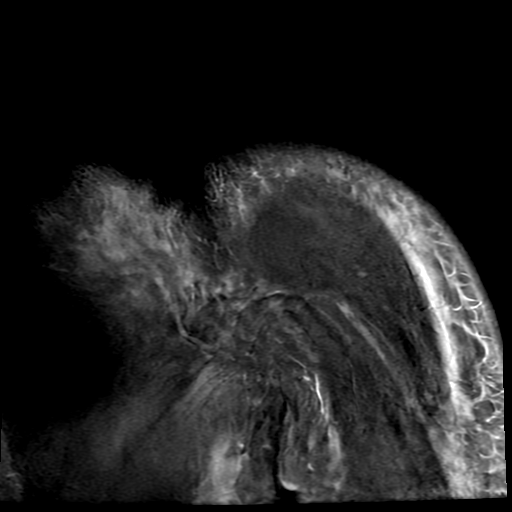

[Series 4: PD fat-sat · coronal · 4.0mm · 0.31mm/px · 5 of 21 slices shown (2 of 2)]
[im 1/21]
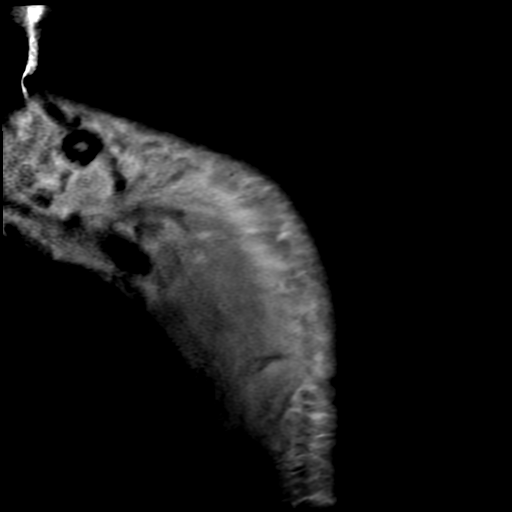
[im 6/21]
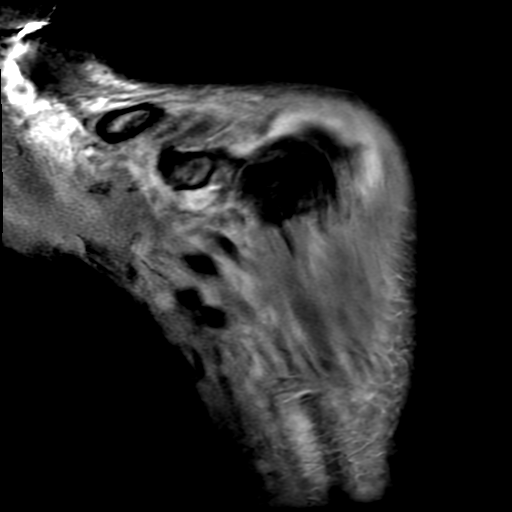
[im 11/21]
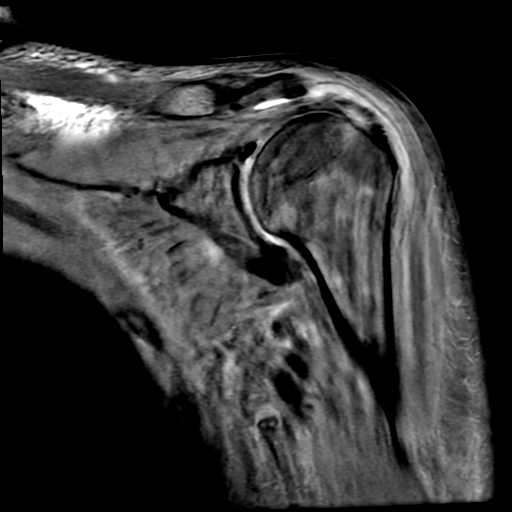
[im 16/21]
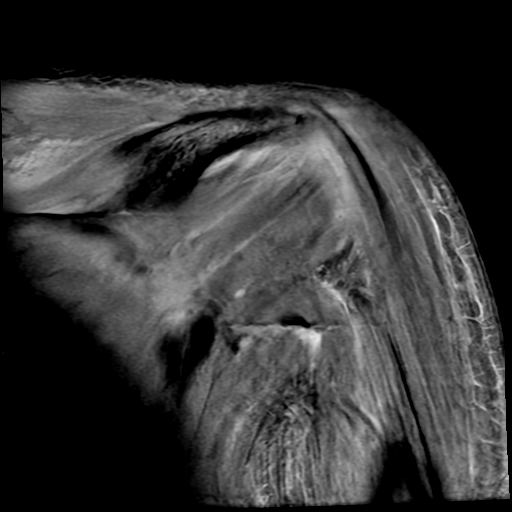
[im 21/21]
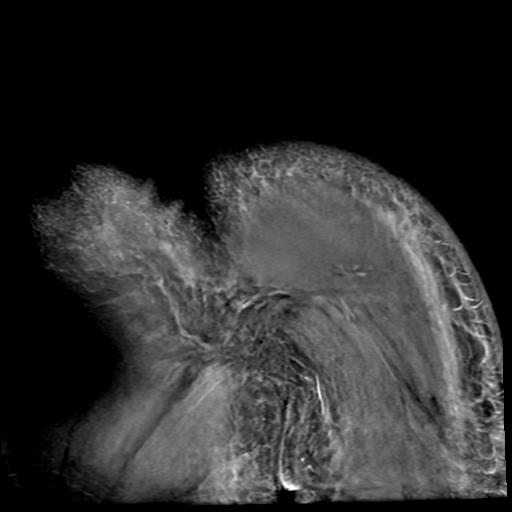

[Series 5: T2 fat-sat · oblique · 4.0mm · 0.31mm/px · 3 of 23 slices shown (2 of 2)]
[im 1/23]
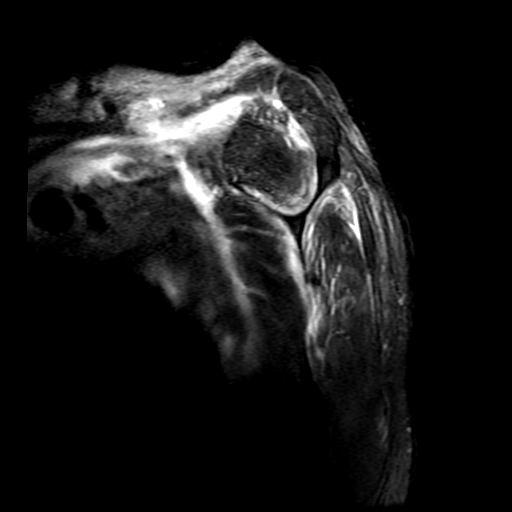
[im 12/23]
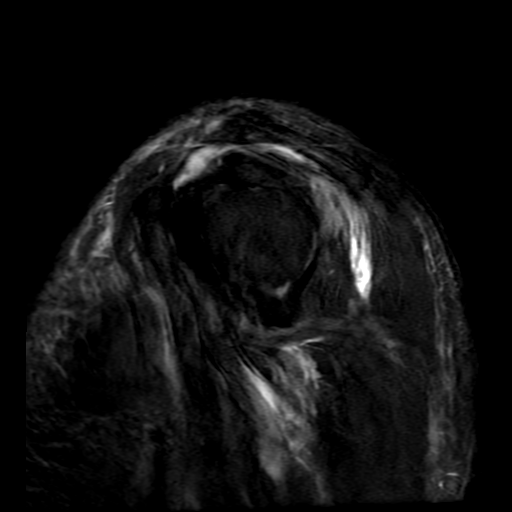
[im 23/23]
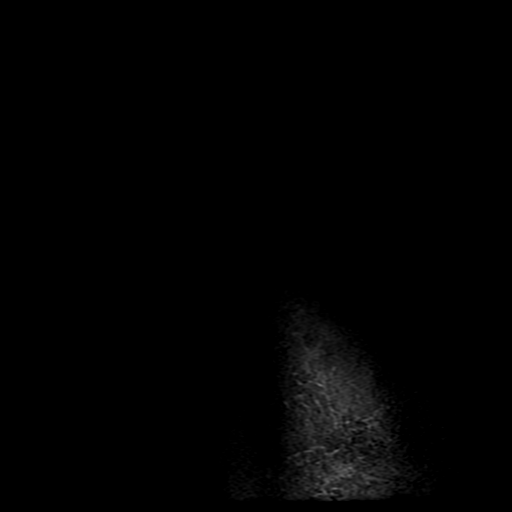

[18 of 40 positions shown; findings below may reference images not displayed]

FINDINGS: Technical note: Examination is mildly degraded by motion artifact.

Rotator cuff: Heterogeneously increased signal in the distal
supraspinatus and infraspinatus tendons with probable articular
sided tear of the infraspinatus tendon (series 3, image 10) and
questionable bursal sided tearing of the anterior supraspinatus
tendon (series 3, image 14) no full-thickness or retracted tear.
Mildly thickened heterogeneous appearance of the subscapularis
tendon which may be reactive or secondary to tendinosis. Teres minor
intact.

Muscles: Intramuscular edema throughout the rotator cuff
musculature, most pronounced within the infraspinatus muscle belly.

Biceps long head:  Intact.

Acromioclavicular Joint: Normal acromioclavicular joint. Moderate
volume subacromial-subdeltoid bursitis.

Glenohumeral Joint: Small volume glenohumeral joint effusion. No
discrete chondral defect.

Labrum:  Grossly intact, poorly evaluated.

Bones: Prominent patchy isointense T1 marrow signal within the
proximal humeral metaphysis suggestive of prominent red marrow. No
associated cortical destruction or bone marrow edema to suggest
osteomyelitis. Mild reactive subcortical marrow signal changes at
the posterior aspect of the greater tuberosity.

Other: Extensive surrounding soft tissue edema and pericapsular
enhancement. There is edema within the visualized chest wall and
supraclavicular regions. Partially visualized patchy signal within
the left lung, suggestive of multifocal pneumonia as seen on recent
chest x-ray.
IMPRESSION: 1. Small glenohumeral joint effusion with prominent pericapsular
soft tissue enhancement surrounding the left shoulder, suspicious
for septic arthritis in the setting of known systemic infection.
Arthrocentesis is recommended.
2. Moderate subacromial-subdeltoid bursitis.
3. Heterogeneously increased signal within the distal supraspinatus
and infraspinatus tendons with probable articular sided tear of the
infraspinatus tendon and questionable bursal sided tearing of the
anterior supraspinatus tendon. No full-thickness rotator cuff tendon
tear.
4. Intramuscular edema throughout the rotator cuff musculature, most
pronounced within the infraspinatus muscle belly. Findings are
nonspecific but may reflect myositis.
5. Prominent red marrow within the proximal humeral metaphysis
without associated cortical destruction or bone marrow edema to
suggest osteomyelitis.

These results will be called to the ordering clinician or
representative by the Radiologist Assistant, and communication
documented in the PACS or zVision Dashboard.

## 2019-05-24 IMAGING — MR MR WRIST*L* WO/W CM
4 series · 18 of 40 positions shown · IV contrast (gadavist)
Comparison: None.

CLINICAL DATA: Left wrist pain. Sepsis. Concern for septic
arthritis

EXAM:
MR OF THE LEFT WRIST WITHOUT AND WITH CONTRAST
TECHNIQUE: Multiplanar multisequence MR imaging of the left wrist was performed
both before and after the administration of intravenous contrast.
CONTRAST:  5.5mL GADAVIST GADOBUTROL 1 MMOL/ML IV SOLN

[Series 15: PD fat-sat · coronal · 3.0mm · 0.21mm/px · 9 of 18 slices shown]
[im 1/18]
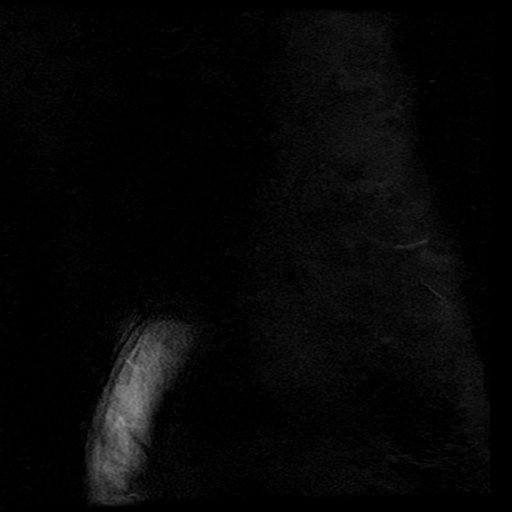
[im 3/18]
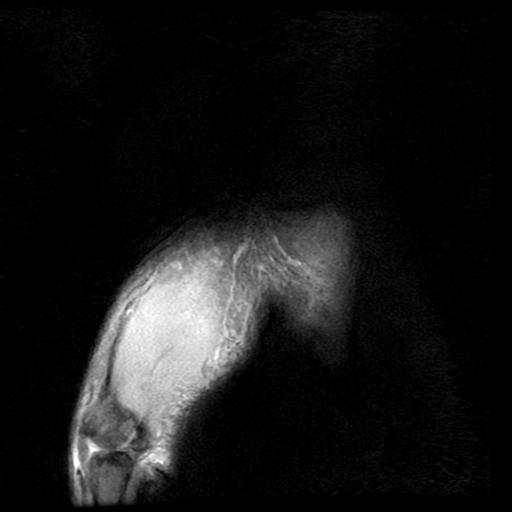
[im 5/18]
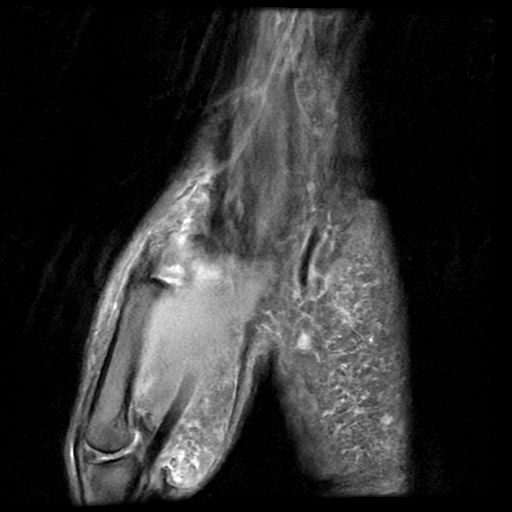
[im 7/18]
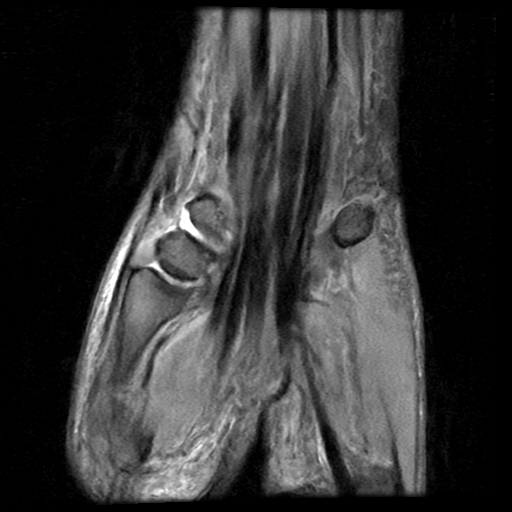
[im 9/18]
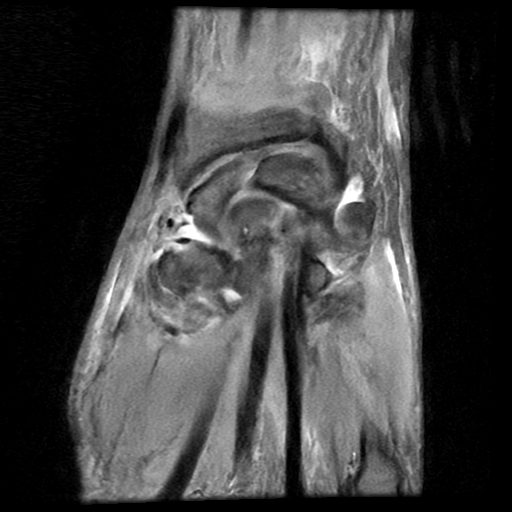
[im 11/18]
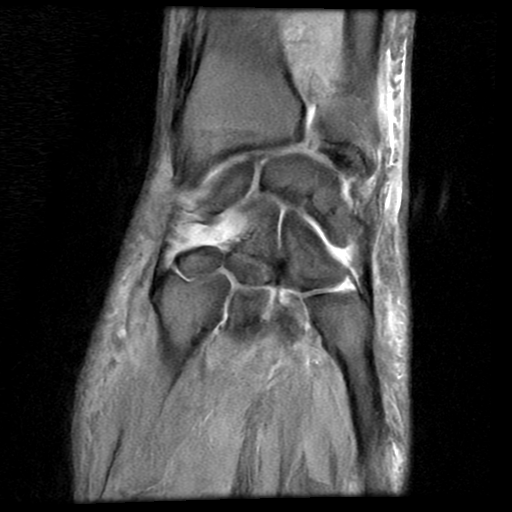
[im 13/18]
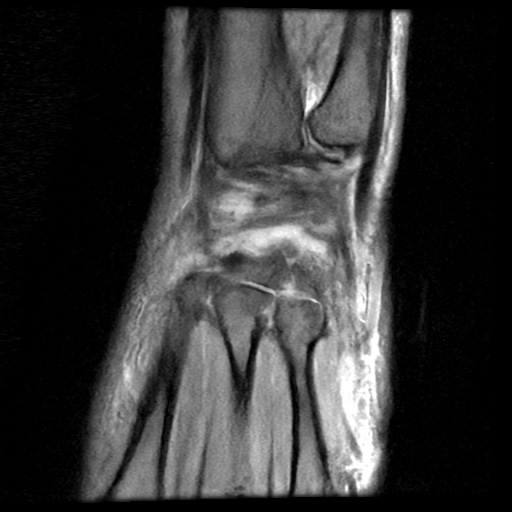
[im 15/18]
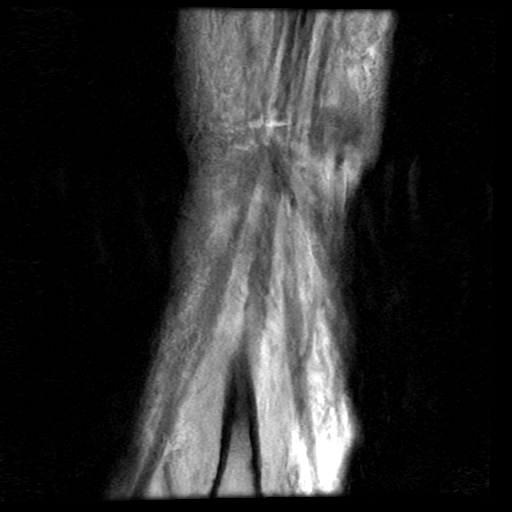
[im 18/18]
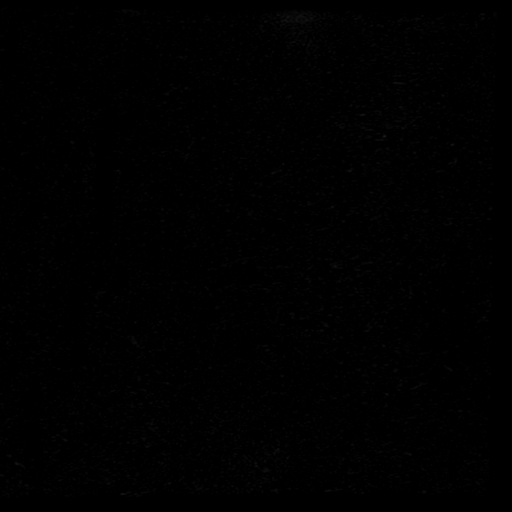

[Series 16: T2 fat-sat · coronal · 3.0mm · 0.21mm/px · 3 of 18 slices shown]
[im 2/18]
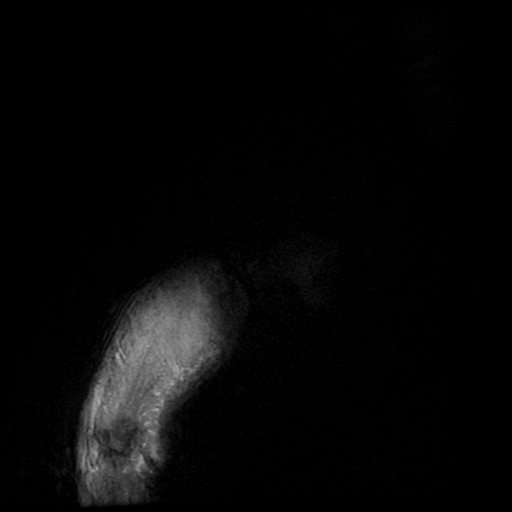
[im 10/18]
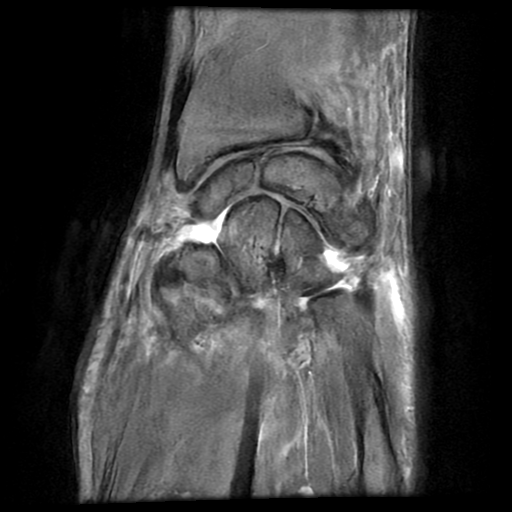
[im 16/18]
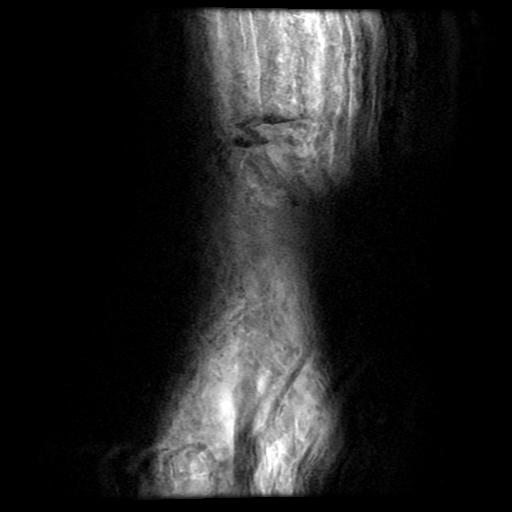

[Series 18: T1 fat-sat · axial · non-contrast · 3.0mm · 0.21mm/px · z∈[-121,-64]mm · 3 of 20 slices shown]
[im 2/20]
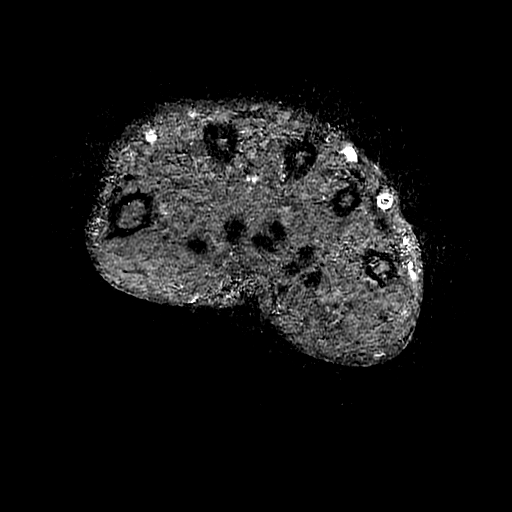
[im 10/20]
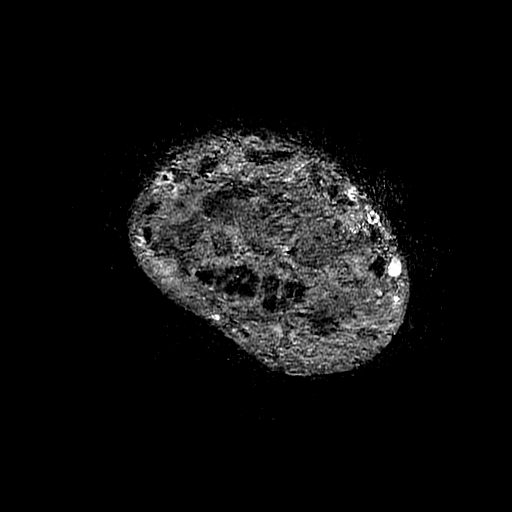
[im 18/20]
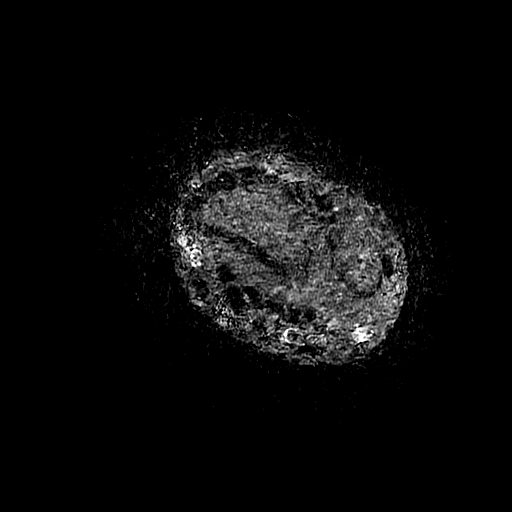

[Series 20: T1 fat-sat post-contrast · coronal · 3.0mm · 0.23mm/px · 3 of 18 slices shown]
[im 2/18]
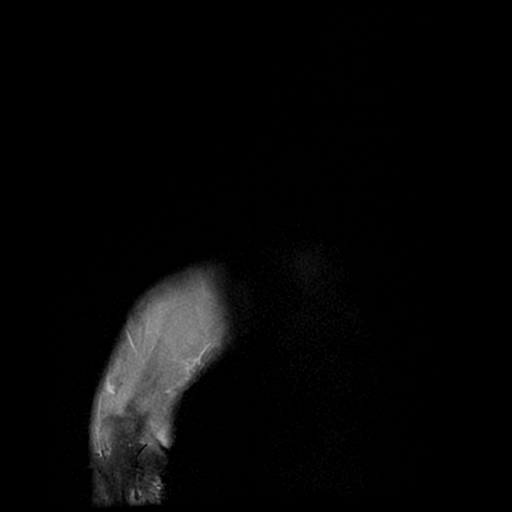
[im 10/18]
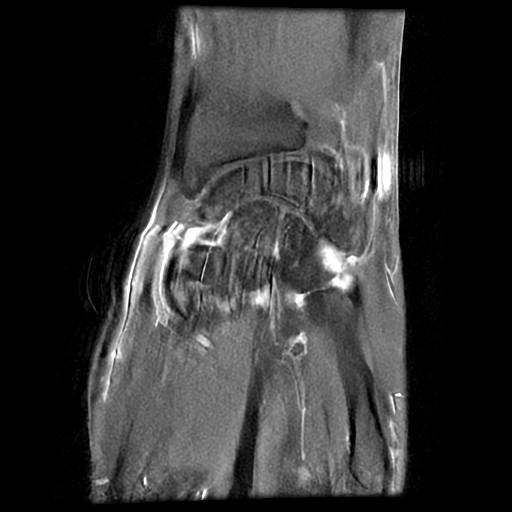
[im 16/18]
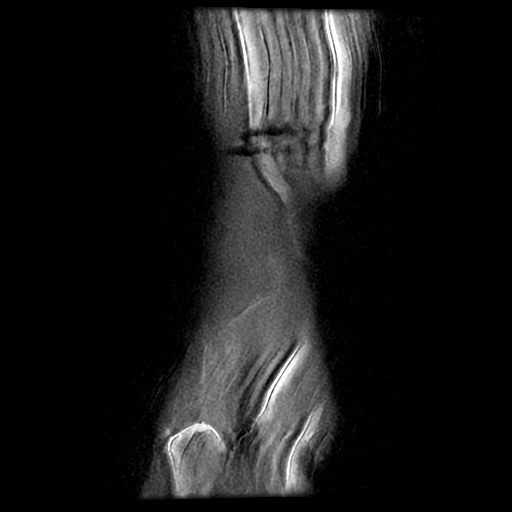

[18 of 40 positions shown; findings below may reference images not displayed]

FINDINGS: Technical note: Examination is mildly degraded by motion artifact.

Ligaments: Scapholunate ligament is grossly intact.

Triangular fibrocartilage: Heterogeneity of the articular disc near
the radial attachment (series 15, image 11) with a small amount of
fluid in the distal radioulnar joint raising the suspicion for a
small TFC perforation.

Tendons: Mild tendinosis and tenosynovitis of the extensor carpi
ulnaris tendon at the level of the ulnar styloid (series 21, image
19). Extensor tendons appear otherwise intact. Flexor tendons appear
intact.

Carpal tunnel/median nerve: Unremarkable.

Guyon's canal: Unremarkable.

Joint: Midcarpal joint effusion with synovitis and enhancement. No
joint fluid within the proximal carpal row.

Bones/carpal alignment: No acute fracture. There is preservation of
the T1 fatty marrow signal without evidence of osteomyelitis.
Incidental note of lunotriquetral coalition. Osseous alignment is
maintained without dislocation.

Other: Mild soft tissue edema without a well-defined or rim
enhancing fluid collection.
IMPRESSION: 1. Midcarpal joint effusion with synovitis and enhancement. Findings
can be seen in the setting of inflammatory or infectious
arthropathy. Arthrocentesis is recommended to evaluate for septic
arthritis.
2. No evidence of osteomyelitis.
3. Mild tendinosis and tenosynovitis of the ECU tendon at the level
of the ulnar styloid.
4. Heterogeneity of the articular disc near the radial attachment
with a small amount of fluid in the distal radioulnar joint raising
the suspicion for a small TFC perforation.
5. Lunotriquetral coalition incidentally noted.

These results will be called to the ordering clinician or
representative by the Radiologist Assistant, and communication
documented in the PACS or zVision Dashboard.

## 2019-05-24 IMAGING — MR MR LUMBAR SPINE WO/W CM
6 of 14 series · 19 of 48 positions shown · IV contrast (gadavist)
Comparison: None.

CLINICAL DATA: Back pain, history of IV drug use

EXAM:
MRI LUMBAR SPINE WITHOUT AND WITH CONTRAST
TECHNIQUE: Multiplanar and multiecho pulse sequences of the lumbar spine were
obtained without and with intravenous contrast.
CONTRAST:  5.5mL GADAVIST GADOBUTROL 1 MMOL/ML IV SOLN

[Series 3: T2 · sagittal · 4.0mm · 0.55mm/px · 1 of 15 slices shown (1 of 3)]
[im 1/15]
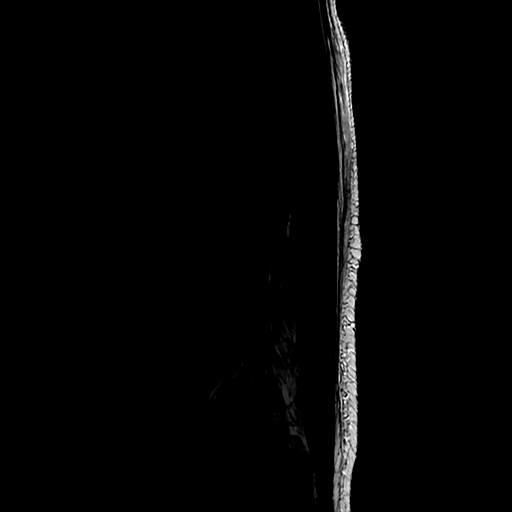

[Series 5: T1 · sagittal · 4.0mm · 0.55mm/px · 2 of 15 slices shown (1 of 2)]
[im 1/15]
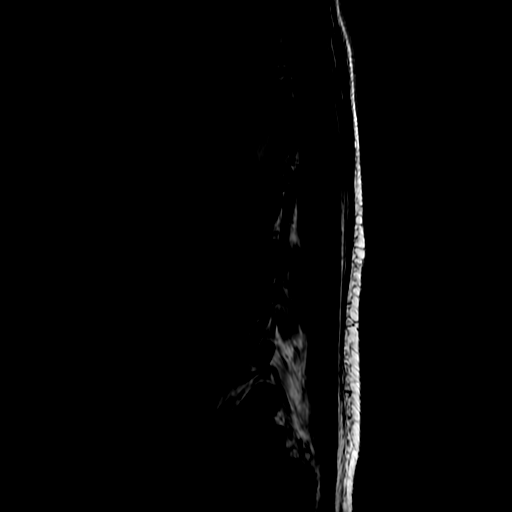
[im 15/15]
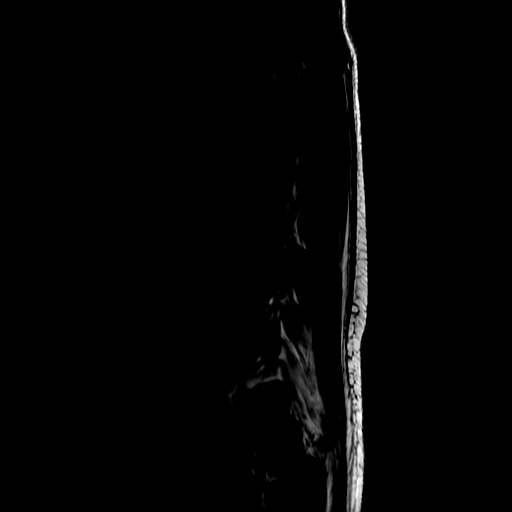

[Series 9: STIR · sagittal · 4.0mm · 0.55mm/px · 1 of 15 slices shown]
[im 1/15]
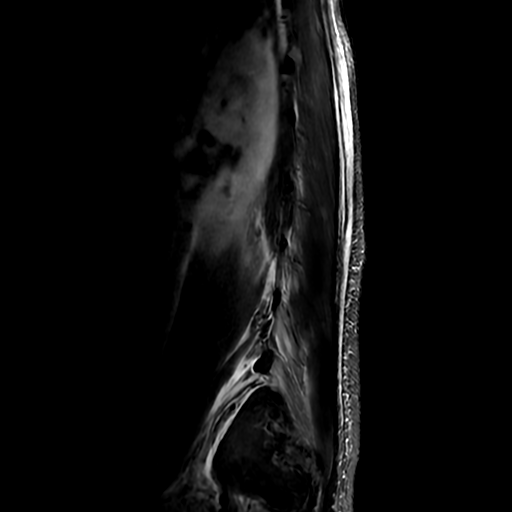

[Series 10: T2 · axial · 4.0mm · 0.39mm/px · z∈[-170,+41]mm · 5 of 42 slices shown (2 of 3)]
[im 1/42]
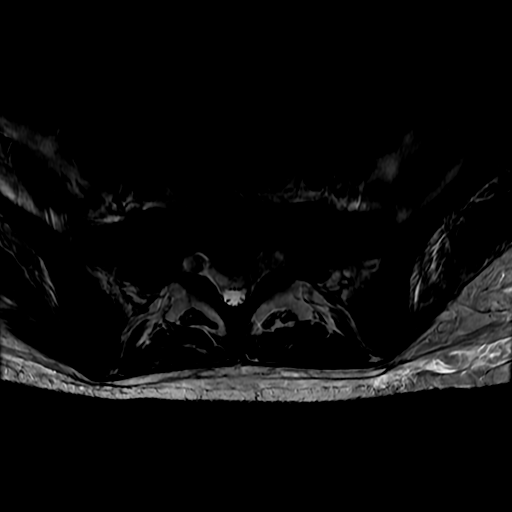
[im 11/42]
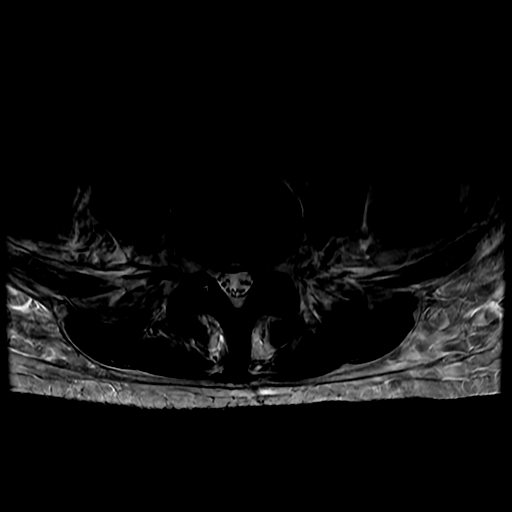
[im 21/42]
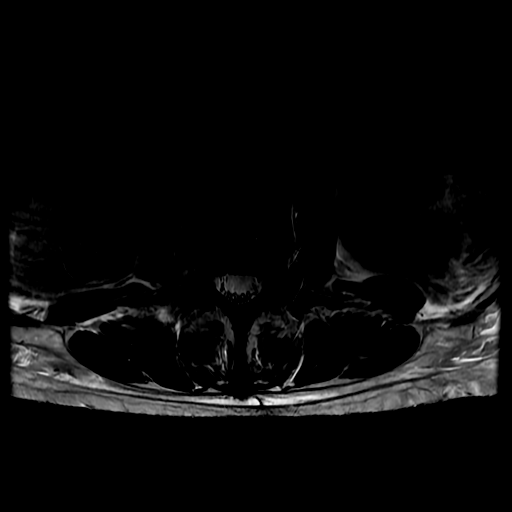
[im 31/42]
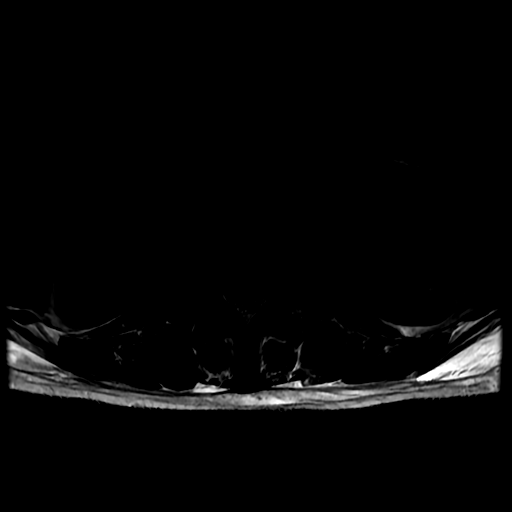
[im 42/42]
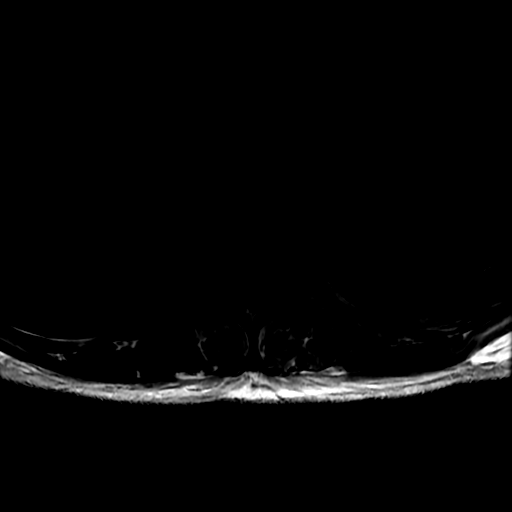

[Series 11: T1 · axial · 4.0mm · 0.39mm/px · z∈[-170,+41]mm · 5 of 42 slices shown (2 of 2)]
[im 1/42]
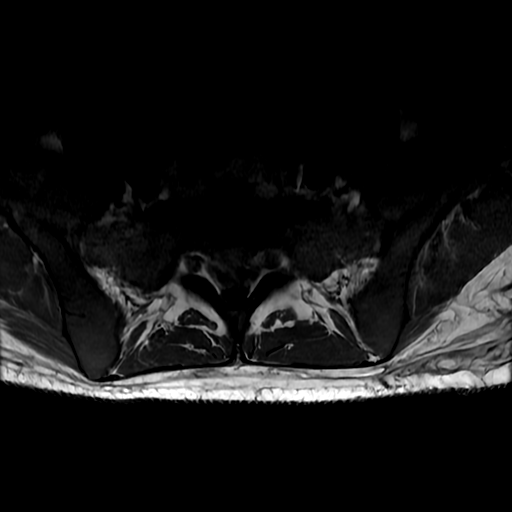
[im 11/42]
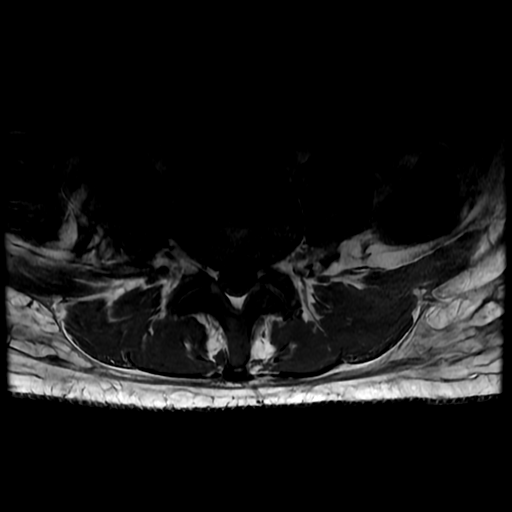
[im 21/42]
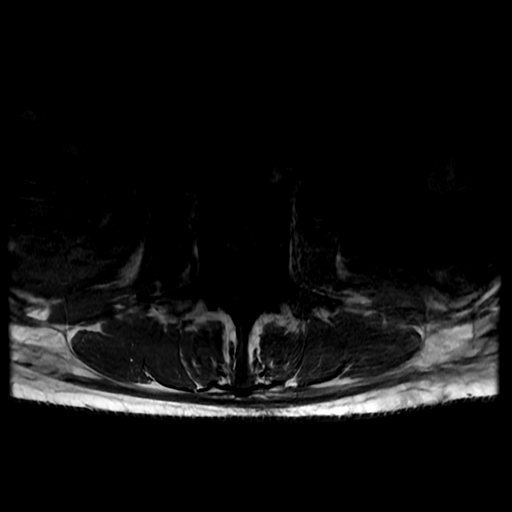
[im 31/42]
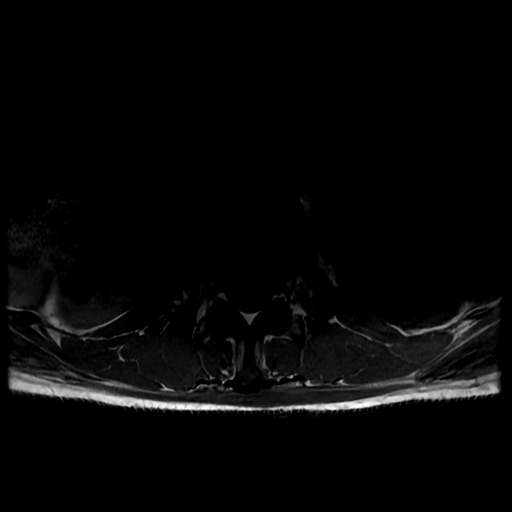
[im 42/42]
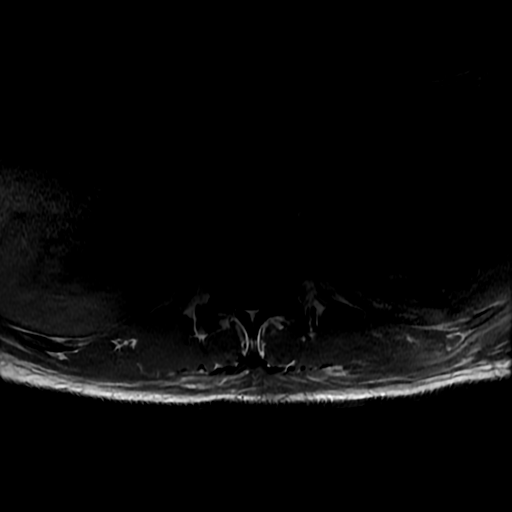

[Series 12: T2 · axial · 4.0mm · 0.39mm/px · z∈[-170,+41]mm · 5 of 42 slices shown (3 of 3)]
[im 1/42]
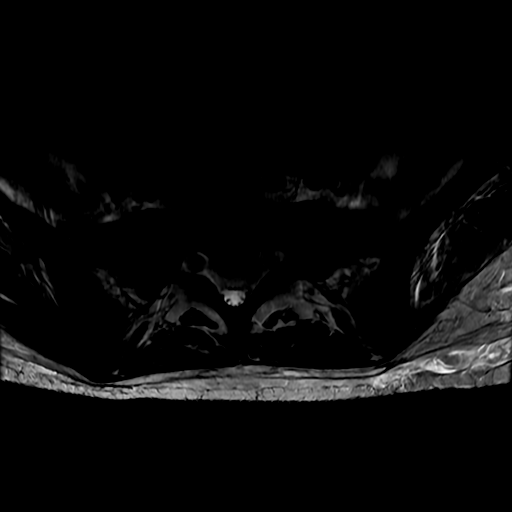
[im 11/42]
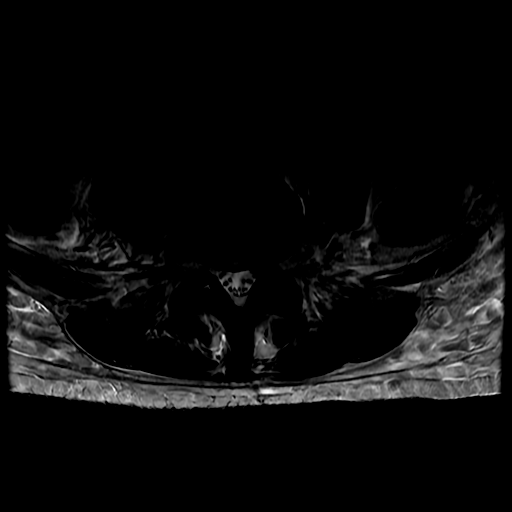
[im 21/42]
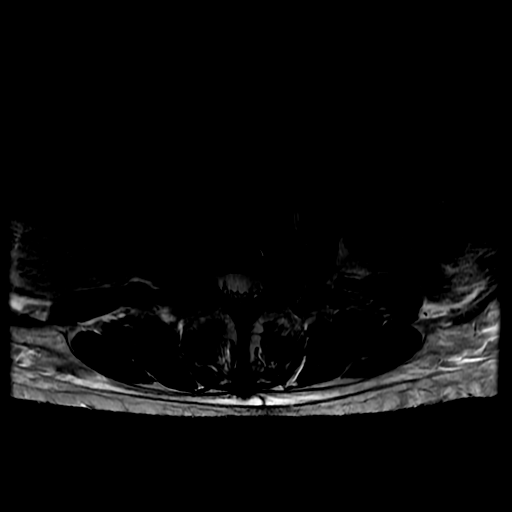
[im 31/42]
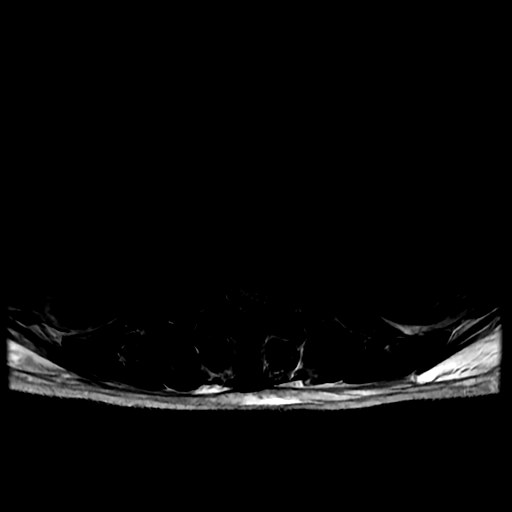
[im 42/42]
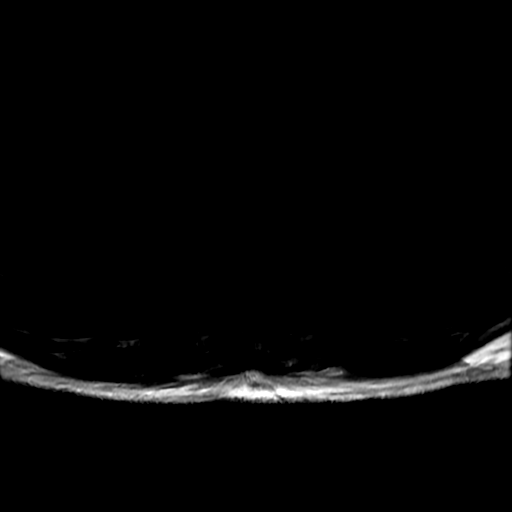

[19 of 48 positions shown; findings below may reference images not displayed]

FINDINGS: Motion artifact is present particularly on axial imaging.

Segmentation:  Standard.

Alignment: Straightening of the lumbar lordosis. No significant
anteroposterior listhesis.

Vertebrae: Vertebral body heights are preserved. There is diffusely
abnormal T1 marrow signal. Mild marrow edema is present at the
opposing L5-S1 endplates.

Conus medullaris and cauda equina: Conus extends to the L1 level.
Conus and cauda equina appear normal.

Paraspinal and other soft tissues: Mild presacral edema.

Disc levels:

Mild disc desiccation at L4-L5 with small central protrusion and
annular fissure. No stenosis.

Mild disc desiccation and height loss at L5-S1. Superimposed mild
disc bulge with endplate osteophytic ridging. There is enhancement
of the disc posteriorly. There is ventral epidural enhancing T2
hyperintense soft tissue extending inferiorly from the disc to the
lower S1 level with resulting mild effacement of the thecal sac.
This abuts the traversing S1 nerve roots. Mild to moderate foraminal
stenosis.
IMPRESSION: Enhancing ventral epidural soft tissue at the L5-S1 disc level
extending to the lower S1 level favored to reflect phlegmon. There
is partial effacement of the thecal sac and potential compression of
the traversing S1 nerve roots.

Enhancement of the posterior L5-S1 disc and mild marrow edema at
L5-S1 endplates is presumed to reflect discitis/osteomyelitis given
above.

Diffusely abnormal T1 marrow signal, which may reflect hematopoietic
marrow in the setting of anemia with other marrow infiltrative
process not excluded.

## 2019-05-24 SURGERY — MRI WITH ANESTHESIA
Anesthesia: General

## 2019-05-24 MED ORDER — FENTANYL CITRATE (PF) 250 MCG/5ML IJ SOLN
INTRAMUSCULAR | Status: DC | PRN
  Administered 2019-05-24 (×2): 100 ug via INTRAVENOUS

## 2019-05-24 MED ORDER — DEXAMETHASONE SODIUM PHOSPHATE 10 MG/ML IJ SOLN
INTRAMUSCULAR | Status: DC | PRN
  Administered 2019-05-24: 4 mg via INTRAVENOUS

## 2019-05-24 MED ORDER — LIDOCAINE 2% (20 MG/ML) 5 ML SYRINGE
INTRAMUSCULAR | Status: DC | PRN
  Administered 2019-05-24: 100 mg via INTRAVENOUS

## 2019-05-24 MED ORDER — KETOROLAC TROMETHAMINE 15 MG/ML IJ SOLN
15.0000 mg | Freq: Once | INTRAMUSCULAR | Status: AC
  Administered 2019-05-24: 15 mg via INTRAVENOUS
  Filled 2019-05-24: qty 1

## 2019-05-24 MED ORDER — PHENYLEPHRINE 40 MCG/ML (10ML) SYRINGE FOR IV PUSH (FOR BLOOD PRESSURE SUPPORT)
PREFILLED_SYRINGE | INTRAVENOUS | Status: DC | PRN
  Administered 2019-05-24 (×2): 120 ug via INTRAVENOUS

## 2019-05-24 MED ORDER — GADOBUTROL 1 MMOL/ML IV SOLN
5.5000 mL | Freq: Once | INTRAVENOUS | Status: AC | PRN
  Administered 2019-05-24: 15:00:00 5.5 mL via INTRAVENOUS

## 2019-05-24 MED ORDER — PROPOFOL 10 MG/ML IV BOLUS
INTRAVENOUS | Status: DC | PRN
  Administered 2019-05-24: 150 mg via INTRAVENOUS

## 2019-05-24 MED ORDER — MIDAZOLAM HCL 5 MG/5ML IJ SOLN
INTRAMUSCULAR | Status: DC | PRN
  Administered 2019-05-24 (×2): 2 mg via INTRAVENOUS

## 2019-05-24 MED ORDER — ROCURONIUM BROMIDE 10 MG/ML (PF) SYRINGE
PREFILLED_SYRINGE | INTRAVENOUS | Status: DC | PRN
  Administered 2019-05-24: 10 mg via INTRAVENOUS
  Administered 2019-05-24: 50 mg via INTRAVENOUS

## 2019-05-24 MED ORDER — SUGAMMADEX SODIUM 200 MG/2ML IV SOLN
INTRAVENOUS | Status: DC | PRN
  Administered 2019-05-24: 200 mg via INTRAVENOUS

## 2019-05-24 MED ORDER — PROMETHAZINE HCL 25 MG/ML IJ SOLN: 6.2500 mg | INTRAMUSCULAR | Status: DC | PRN

## 2019-05-24 MED ORDER — EPHEDRINE SULFATE-NACL 50-0.9 MG/10ML-% IV SOSY
PREFILLED_SYRINGE | INTRAVENOUS | Status: DC | PRN
  Administered 2019-05-24: 5 mg via INTRAVENOUS

## 2019-05-24 MED ORDER — PHENYLEPHRINE HCL-NACL 10-0.9 MG/250ML-% IV SOLN
INTRAVENOUS | Status: DC | PRN
  Administered 2019-05-24: 25 ug/min via INTRAVENOUS

## 2019-05-24 MED ORDER — LACTATED RINGERS IV SOLN: INTRAVENOUS | Status: DC

## 2019-05-24 NOTE — Plan of Care (Signed)
Continue to monitor

## 2019-05-24 NOTE — Anesthesia Preprocedure Evaluation (Addendum)
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, Hepatitis -, C  Endo/Other    Renal/GU      Musculoskeletal   Abdominal Normal abdominal exam  (+)   Peds  Hematology  (+) anemia ,   Anesthesia Other Findings   Reproductive/Obstetrics                            Anesthesia Physical Anesthesia Plan  ASA: III  Anesthesia Plan: General   Post-op Pain Management:    Induction: Intravenous  PONV Risk Score and Plan: 2 and Ondansetron, Dexamethasone and Treatment may vary due to age or medical condition  Airway Management Planned: Oral ETT  Additional Equipment: None  Intra-op Plan:   Post-operative Plan: Extubation in OR  Informed Consent: I have reviewed the patients History and Physical, chart, labs and discussed the procedure including the risks, benefits and alternatives for the proposed anesthesia with the patient or authorized representative who has indicated his/her understanding and acceptance.     Dental advisory given  Plan Discussed with: CRNA  Anesthesia Plan Comments:        Anesthesia Quick Evaluation                                  Anesthesia Evaluation  Patient identified by MRN, date of birth, ID band Patient awake    Reviewed: Allergy & Precautions, NPO status , Patient's Chart, lab work & pertinent test results  History of  Anesthesia Complications Negative for: history of anesthetic complications  Airway Mallampati: II  TM Distance: >3 FB Neck ROM: Full  Mouth opening: Limited Mouth Opening  Dental  (+) Poor Dentition, Chipped, Missing,    Pulmonary Current Smoker,    Pulmonary exam normal        Cardiovascular Normal cardiovascular exam  TTE 05/19/19: normal EF, mild MR, mod-severe TR with suspicious lesion for endocarditis   Neuro/Psych negative neurological ROS  negative psych ROS   GI/Hepatic negative GI ROS, (+)     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  Reproductive/Obstetrics negative OB ROS                           Anesthesia Physical Anesthesia Plan  ASA: III  Anesthesia Plan: MAC   Post-op Pain Management:    Induction:   PONV Risk Score and Plan: 1 and Treatment may vary due to age or medical condition and Propofol infusion  Airway Management Planned: Natural Airway and Nasal Cannula  Additional Equipment: None  Intra-op Plan:   Post-operative Plan:   Informed Consent: I have reviewed the patients History and Physical, chart, labs and discussed the procedure including the risks, benefits and alternatives for the proposed anesthesia with the patient or authorized representative who has indicated his/her understanding and acceptance.     Dental advisory given  Plan Discussed with: CRNA  Anesthesia Plan Comments:        Anesthesia Quick Evaluation

## 2019-05-24 NOTE — Anesthesia Procedure Notes (Signed)
Procedure Name: MAC Date/Time: 05/24/2019 9:10 AM Performed by: Colin Benton, CRNA Pre-anesthesia Checklist: Patient identified, Emergency Drugs available, Suction available and Patient being monitored Patient Re-evaluated:Patient Re-evaluated prior to induction Oxygen Delivery Method: Nasal cannula Placement Confirmation: positive ETCO2 Dental Injury: Teeth and Oropharynx as per pre-operative assessment

## 2019-05-24 NOTE — Transfer of Care (Signed)
Immediate Anesthesia Transfer of Care Note  Patient: Christy Pearson  Procedure(s) Performed: MRI WITH ANESTHESIA - LUMBAR , SHOULDER (N/A )  Patient Location: PACU  Anesthesia Type:General  Level of Consciousness: awake, alert , oriented and patient cooperative  Airway & Oxygen Therapy: Patient Spontanous Breathing and Patient connected to nasal cannula oxygen  Post-op Assessment: Report given to RN, Post -op Vital signs reviewed and stable and Patient moving all extremities X 4  Post vital signs: Reviewed and stable  Last Vitals:  Vitals Value Taken Time  BP 91/49 05/24/19 1308  Temp    Pulse 92 05/24/19 1312  Resp 14 05/24/19 1312  SpO2 91 % 05/24/19 1312  Vitals shown include unvalidated device data.  Last Pain:  Vitals:   05/24/19 1308  TempSrc:   PainSc: (P) 0-No pain      Patients Stated Pain Goal: 2 (05/24/19 2194)  Complications: No apparent anesthesia complications

## 2019-05-24 NOTE — Progress Notes (Signed)
MRI sheets faxed

## 2019-05-24 NOTE — Progress Notes (Signed)
PT's grandmother, Royann Shivers advised of hospital policy. Grandmother and daughter, Christain [place] have been switching visits. Both advised of policy. Grandmother will finish visit today and starting tomorrow daughter will be the only one for the entire hospital stay. Lacy Duverney, RN

## 2019-05-24 NOTE — Anesthesia Procedure Notes (Signed)
Procedure Name: Intubation Date/Time: 05/24/2019 10:44 AM Performed by: Colin Benton, CRNA Pre-anesthesia Checklist: Patient identified, Emergency Drugs available and Suction available Patient Re-evaluated:Patient Re-evaluated prior to induction Oxygen Delivery Method: Circle system utilized Preoxygenation: Pre-oxygenation with 100% oxygen Induction Type: IV induction Ventilation: Mask ventilation without difficulty Laryngoscope Size: Mac and 4 Grade View: Grade III Tube type: Oral Tube size: 6.5 mm Number of attempts: 2 Airway Equipment and Method: Stylet Placement Confirmation: ETT inserted through vocal cords under direct vision,  positive ETCO2 and breath sounds checked- equal and bilateral Secured at: 22 cm Tube secured with: Tape Dental Injury: Teeth and Oropharynx as per pre-operative assessment  Comments: DL x 1 with Mil 2.  Grade 3 view.  Anterior airway.  Attempted to pass ETT however unsuccessful.  Positive BMV.  DL x2 with MAC 4.  Grade 3 view.  Successful intubation and EBBS.  VSS.

## 2019-05-25 ENCOUNTER — Encounter: Payer: Self-pay | Admitting: *Deleted

## 2019-05-25 ENCOUNTER — Inpatient Hospital Stay (HOSPITAL_COMMUNITY): Payer: Self-pay | Admitting: Certified Registered Nurse Anesthetist

## 2019-05-25 ENCOUNTER — Encounter (HOSPITAL_COMMUNITY)
Admission: EM | Disposition: A | Discharge status: Home / Self Care | Payer: Self-pay | Source: Home / Self Care | Attending: Internal Medicine

## 2019-05-25 DIAGNOSIS — M25411 Effusion, right shoulder: Secondary | ICD-10-CM

## 2019-05-25 DIAGNOSIS — R768 Other specified abnormal immunological findings in serum: Secondary | ICD-10-CM | POA: Diagnosis not present

## 2019-05-25 DIAGNOSIS — G061 Intraspinal abscess and granuloma: Secondary | ICD-10-CM

## 2019-05-25 DIAGNOSIS — M4646 Discitis, unspecified, lumbar region: Secondary | ICD-10-CM

## 2019-05-25 DIAGNOSIS — N12 Tubulo-interstitial nephritis, not specified as acute or chronic: Secondary | ICD-10-CM | POA: Diagnosis not present

## 2019-05-25 DIAGNOSIS — R7881 Bacteremia: Secondary | ICD-10-CM | POA: Diagnosis not present

## 2019-05-25 DIAGNOSIS — B9562 Methicillin resistant Staphylococcus aureus infection as the cause of diseases classified elsewhere: Secondary | ICD-10-CM | POA: Diagnosis not present

## 2019-05-25 DIAGNOSIS — M009 Pyogenic arthritis, unspecified: Secondary | ICD-10-CM | POA: Diagnosis present

## 2019-05-25 DIAGNOSIS — F1910 Other psychoactive substance abuse, uncomplicated: Secondary | ICD-10-CM

## 2019-05-25 DIAGNOSIS — I079 Rheumatic tricuspid valve disease, unspecified: Secondary | ICD-10-CM | POA: Diagnosis not present

## 2019-05-25 DIAGNOSIS — M25412 Effusion, left shoulder: Secondary | ICD-10-CM

## 2019-05-25 DIAGNOSIS — I2690 Septic pulmonary embolism without acute cor pulmonale: Secondary | ICD-10-CM | POA: Diagnosis not present

## 2019-05-25 DIAGNOSIS — M4626 Osteomyelitis of vertebra, lumbar region: Secondary | ICD-10-CM

## 2019-05-25 DIAGNOSIS — I330 Acute and subacute infective endocarditis: Secondary | ICD-10-CM | POA: Diagnosis not present

## 2019-05-25 HISTORY — PX: SHOULDER ARTHROSCOPY: SHX128

## 2019-05-25 LAB — TYPE AND SCREEN
ABO/RH(D): A POS
Antibody Screen: NEGATIVE
Unit division: 0

## 2019-05-25 LAB — BPAM RBC
Blood Product Expiration Date: 202102232359
ISSUE DATE / TIME: 202101302200
Unit Type and Rh: 6200

## 2019-05-25 LAB — SURGICAL PCR SCREEN
MRSA, PCR: NEGATIVE
Staphylococcus aureus: NEGATIVE

## 2019-05-25 SURGERY — ARTHROSCOPY, SHOULDER
Anesthesia: General | Site: Shoulder | Laterality: Bilateral

## 2019-05-25 MED ORDER — MIDAZOLAM HCL 2 MG/2ML IJ SOLN
INTRAMUSCULAR | Status: AC
  Filled 2019-05-25: qty 2

## 2019-05-25 MED ORDER — HYDROMORPHONE HCL 1 MG/ML IJ SOLN
0.2500 mg | INTRAMUSCULAR | Status: DC | PRN
  Administered 2019-05-25 (×3): 0.5 mg via INTRAVENOUS

## 2019-05-25 MED ORDER — SUGAMMADEX SODIUM 200 MG/2ML IV SOLN
INTRAVENOUS | Status: DC | PRN
  Administered 2019-05-25: 150 mg via INTRAVENOUS

## 2019-05-25 MED ORDER — ONDANSETRON HCL 4 MG/2ML IJ SOLN
INTRAMUSCULAR | Status: AC
  Filled 2019-05-25: qty 2

## 2019-05-25 MED ORDER — BUPIVACAINE HCL (PF) 0.5 % IJ SOLN
INTRAMUSCULAR | Status: DC | PRN
  Administered 2019-05-25 (×2): 10 mL

## 2019-05-25 MED ORDER — PHENYLEPHRINE 40 MCG/ML (10ML) SYRINGE FOR IV PUSH (FOR BLOOD PRESSURE SUPPORT)
PREFILLED_SYRINGE | INTRAVENOUS | Status: DC | PRN
  Administered 2019-05-25: 160 ug via INTRAVENOUS
  Administered 2019-05-25 (×2): 120 ug via INTRAVENOUS

## 2019-05-25 MED ORDER — DEXAMETHASONE SODIUM PHOSPHATE 10 MG/ML IJ SOLN
INTRAMUSCULAR | Status: DC | PRN
  Administered 2019-05-25: 10 mg via INTRAVENOUS

## 2019-05-25 MED ORDER — KETAMINE HCL 50 MG/5ML IJ SOSY
PREFILLED_SYRINGE | INTRAMUSCULAR | Status: AC
  Filled 2019-05-25: qty 5

## 2019-05-25 MED ORDER — KETAMINE HCL 10 MG/ML IJ SOLN
INTRAMUSCULAR | Status: DC | PRN
  Administered 2019-05-25: 20 mg via INTRAVENOUS
  Administered 2019-05-25: 10 mg via INTRAVENOUS
  Administered 2019-05-25: 20 mg via INTRAVENOUS

## 2019-05-25 MED ORDER — PROPOFOL 10 MG/ML IV BOLUS
INTRAVENOUS | Status: DC | PRN
  Administered 2019-05-25: 110 mg via INTRAVENOUS

## 2019-05-25 MED ORDER — ONDANSETRON HCL 4 MG/2ML IJ SOLN
INTRAMUSCULAR | Status: DC | PRN
  Administered 2019-05-25: 4 mg via INTRAVENOUS

## 2019-05-25 MED ORDER — SODIUM CHLORIDE 0.9 % IR SOLN
Status: DC | PRN
  Administered 2019-05-25 (×2): 9000 mL

## 2019-05-25 MED ORDER — HYDROMORPHONE HCL 1 MG/ML IJ SOLN
INTRAMUSCULAR | Status: AC
  Filled 2019-05-25: qty 1

## 2019-05-25 MED ORDER — BUPIVACAINE HCL (PF) 0.25 % IJ SOLN
INTRAMUSCULAR | Status: AC
  Filled 2019-05-25: qty 30

## 2019-05-25 MED ORDER — ROCURONIUM BROMIDE 50 MG/5ML IV SOSY
PREFILLED_SYRINGE | INTRAVENOUS | Status: DC | PRN
  Administered 2019-05-25: 10 mg via INTRAVENOUS
  Administered 2019-05-25: 50 mg via INTRAVENOUS
  Administered 2019-05-25: 20 mg via INTRAVENOUS

## 2019-05-25 MED ORDER — KETOROLAC TROMETHAMINE 30 MG/ML IJ SOLN
INTRAMUSCULAR | Status: AC
  Filled 2019-05-25: qty 1

## 2019-05-25 MED ORDER — AMOXICILLIN 500 MG PO CAPS
500.0000 mg | ORAL_CAPSULE | Freq: Once | ORAL | Status: AC
  Administered 2019-05-25: 500 mg via ORAL
  Filled 2019-05-25 (×2): qty 1

## 2019-05-25 MED ORDER — PHENYLEPHRINE 40 MCG/ML (10ML) SYRINGE FOR IV PUSH (FOR BLOOD PRESSURE SUPPORT)
PREFILLED_SYRINGE | INTRAVENOUS | Status: AC
  Filled 2019-05-25: qty 20

## 2019-05-25 MED ORDER — PHENYLEPHRINE HCL-NACL 10-0.9 MG/250ML-% IV SOLN
INTRAVENOUS | Status: DC | PRN
  Administered 2019-05-25: 25 ug/min via INTRAVENOUS

## 2019-05-25 MED ORDER — MIDAZOLAM HCL 5 MG/5ML IJ SOLN
INTRAMUSCULAR | Status: DC | PRN
  Administered 2019-05-25: 2 mg via INTRAVENOUS

## 2019-05-25 MED ORDER — DEXAMETHASONE SODIUM PHOSPHATE 10 MG/ML IJ SOLN
INTRAMUSCULAR | Status: AC
  Filled 2019-05-25: qty 1

## 2019-05-25 MED ORDER — CHLORHEXIDINE GLUCONATE 4 % EX LIQD
60.0000 mL | Freq: Once | CUTANEOUS | Status: AC
  Administered 2019-05-25: 4 via TOPICAL
  Filled 2019-05-25: qty 60

## 2019-05-25 MED ORDER — BUPIVACAINE HCL (PF) 0.5 % IJ SOLN
INTRAMUSCULAR | Status: AC
  Filled 2019-05-25: qty 30

## 2019-05-25 MED ORDER — EPHEDRINE 5 MG/ML INJ
INTRAVENOUS | Status: AC
  Filled 2019-05-25: qty 10

## 2019-05-25 MED ORDER — LIDOCAINE 2% (20 MG/ML) 5 ML SYRINGE
INTRAMUSCULAR | Status: DC | PRN
  Administered 2019-05-25: 60 mg via INTRAVENOUS

## 2019-05-25 MED ORDER — LIDOCAINE HCL (PF) 1 % IJ SOLN
INTRAMUSCULAR | Status: AC
  Filled 2019-05-25: qty 30

## 2019-05-25 MED ORDER — LACTATED RINGERS IV SOLN: INTRAVENOUS | Status: DC

## 2019-05-25 MED ORDER — POVIDONE-IODINE 10 % EX SWAB: 2.0000 "application " | Freq: Once | CUTANEOUS | Status: DC

## 2019-05-25 MED ORDER — FENTANYL CITRATE (PF) 250 MCG/5ML IJ SOLN
INTRAMUSCULAR | Status: DC | PRN
  Administered 2019-05-25 (×3): 50 ug via INTRAVENOUS
  Administered 2019-05-25: 100 ug via INTRAVENOUS

## 2019-05-25 MED ORDER — KETOROLAC TROMETHAMINE 15 MG/ML IJ SOLN
15.0000 mg | Freq: Four times a day (QID) | INTRAMUSCULAR | Status: DC | PRN
  Administered 2019-05-25 – 2019-05-27 (×7): 15 mg via INTRAVENOUS
  Filled 2019-05-25 (×7): qty 1

## 2019-05-25 MED ORDER — ROCURONIUM BROMIDE 10 MG/ML (PF) SYRINGE
PREFILLED_SYRINGE | INTRAVENOUS | Status: AC
  Filled 2019-05-25: qty 20

## 2019-05-25 MED ORDER — 0.9 % SODIUM CHLORIDE (POUR BTL) OPTIME
TOPICAL | Status: DC | PRN
  Administered 2019-05-25: 1000 mL

## 2019-05-25 MED ORDER — FENTANYL CITRATE (PF) 250 MCG/5ML IJ SOLN
INTRAMUSCULAR | Status: AC
  Filled 2019-05-25: qty 5

## 2019-05-25 MED ORDER — PROPOFOL 10 MG/ML IV BOLUS
INTRAVENOUS | Status: AC
  Filled 2019-05-25: qty 20

## 2019-05-25 SURGICAL SUPPLY — 50 items
BLADE CUTTER GATOR 3.5 (BLADE) IMPLANT
BLADE EXCALIBUR 4.0MM X 13CM (MISCELLANEOUS) ×2
BLADE EXCALIBUR 4.0X13 (MISCELLANEOUS) ×4 IMPLANT
BLADE SURG 15 STRL LF DISP TIS (BLADE) IMPLANT
BLADE SURG 15 STRL SS (BLADE)
BOOTCOVER CLEANROOM LRG (PROTECTIVE WEAR) ×12 IMPLANT
CANISTER SUCT 3000ML PPV (MISCELLANEOUS) IMPLANT
CANNULA SHOULDER 7CM (CANNULA) IMPLANT
CLOSURE WOUND 1/2 X4 (GAUZE/BANDAGES/DRESSINGS) ×2
COVER WAND RF STERILE (DRAPES) ×3 IMPLANT
DRAPE HALF SHEET 40X57 (DRAPES) ×6 IMPLANT
DRAPE IMP U-DRAPE 54X76 (DRAPES) ×6 IMPLANT
DRAPE INCISE IOBAN 66X45 STRL (DRAPES) ×6 IMPLANT
DRAPE ORTHO SPLIT 77X108 STRL (DRAPES) ×8
DRAPE SHOULDER BEACH CHAIR (DRAPES) ×6 IMPLANT
DRAPE STERI 35X30 U-POUCH (DRAPES) ×6 IMPLANT
DRAPE SURG ORHT 6 SPLT 77X108 (DRAPES) ×4 IMPLANT
DRAPE U-SHAPE 47X51 STRL (DRAPES) ×6 IMPLANT
DRSG PAD ABDOMINAL 8X10 ST (GAUZE/BANDAGES/DRESSINGS) IMPLANT
DURAPREP 26ML APPLICATOR (WOUND CARE) ×6 IMPLANT
ELECT REM PT RETURN 9FT ADLT (ELECTROSURGICAL) ×3
ELECTRODE REM PT RTRN 9FT ADLT (ELECTROSURGICAL) ×1 IMPLANT
FACESHIELD WRAPAROUND (MASK) ×12 IMPLANT
GAUZE SPONGE 4X4 12PLY STRL (GAUZE/BANDAGES/DRESSINGS) ×3 IMPLANT
GAUZE SPONGE 4X4 12PLY STRL LF (GAUZE/BANDAGES/DRESSINGS) ×3 IMPLANT
GLOVE BIOGEL PI IND STRL 8 (GLOVE) ×2 IMPLANT
GLOVE BIOGEL PI INDICATOR 8 (GLOVE) ×4
GLOVE BIOGEL PI ORTHO PRO SZ8 (GLOVE)
GLOVE ORTHO TXT STRL SZ7.5 (GLOVE) ×18 IMPLANT
GLOVE PI ORTHO PRO STRL SZ8 (GLOVE) IMPLANT
GLOVE SURG ORTHO 8.0 STRL STRW (GLOVE) IMPLANT
GOWN STRL REUS W/ TWL XL LVL3 (GOWN DISPOSABLE) ×2 IMPLANT
GOWN STRL REUS W/TWL XL LVL3 (GOWN DISPOSABLE) ×4
KIT BASIN OR (CUSTOM PROCEDURE TRAY) ×6 IMPLANT
PACK ARTHROSCOPY DSU (CUSTOM PROCEDURE TRAY) ×6 IMPLANT
PAD ABD 8X10 STRL (GAUZE/BANDAGES/DRESSINGS) ×6 IMPLANT
PENCIL BUTTON HOLSTER BLD 10FT (ELECTRODE) IMPLANT
SLING ARM FOAM STRAP LRG (SOFTGOODS) IMPLANT
SLING ARM IMMOBILIZER LRG (SOFTGOODS) ×6 IMPLANT
SLING ARM IMMOBILIZER MED (SOFTGOODS) IMPLANT
SPONGE LAP 4X18 RFD (DISPOSABLE) IMPLANT
STRIP CLOSURE SKIN 1/2X4 (GAUZE/BANDAGES/DRESSINGS) ×4 IMPLANT
SUT ETHILON 3 0 PS 1 (SUTURE) ×3 IMPLANT
SYR 20ML LL LF (SYRINGE) ×6 IMPLANT
SYR CONTROL 10ML LL (SYRINGE) ×6 IMPLANT
TOWEL GREEN STERILE FF (TOWEL DISPOSABLE) ×3 IMPLANT
TUBE CONNECTING 12'X1/4 (SUCTIONS) ×1
TUBE CONNECTING 12X1/4 (SUCTIONS) ×2 IMPLANT
TUBING ARTHROSCOPY IRRIG 16FT (MISCELLANEOUS) ×3 IMPLANT
WATER STERILE IRR 1000ML POUR (IV SOLUTION) ×3 IMPLANT

## 2019-05-25 NOTE — Anesthesia Preprocedure Evaluation (Addendum)
Anesthesia Evaluation  Patient identified by MRN, date of birth, ID band Patient awake    Reviewed: Allergy & Precautions, H&P , NPO status , Patient's Chart, lab work & pertinent test results  Airway Mallampati: III  TM Distance: >3 FB Neck ROM: Full    Dental no notable dental hx. (+) Poor Dentition, Dental Advisory Given   Pulmonary Current Smoker and Patient abstained from smoking.,    Pulmonary exam normal breath sounds clear to auscultation       Cardiovascular negative cardio ROS   Rhythm:Regular Rate:Normal     Neuro/Psych negative neurological ROS  negative psych ROS   GI/Hepatic negative GI ROS, (+)     substance abuse  IV drug use, Hepatitis -, C  Endo/Other  negative endocrine ROS  Renal/GU negative Renal ROS  negative genitourinary   Musculoskeletal  (+) Arthritis , Osteoarthritis,    Abdominal   Peds  Hematology  (+) Blood dyscrasia, anemia ,   Anesthesia Other Findings   Reproductive/Obstetrics negative OB ROS                            Anesthesia Physical Anesthesia Plan  ASA: III  Anesthesia Plan: General   Post-op Pain Management:    Induction: Intravenous  PONV Risk Score and Plan: 3 and Ondansetron, Dexamethasone and Midazolam  Airway Management Planned: Oral ETT  Additional Equipment:   Intra-op Plan:   Post-operative Plan: Extubation in OR  Informed Consent: I have reviewed the patients History and Physical, chart, labs and discussed the procedure including the risks, benefits and alternatives for the proposed anesthesia with the patient or authorized representative who has indicated his/her understanding and acceptance.     Dental advisory given  Plan Discussed with: CRNA  Anesthesia Plan Comments:         Anesthesia Quick Evaluation

## 2019-05-25 NOTE — Op Note (Addendum)
05/18/2019 - 05/25/2019  10:13 PM  PATIENT:  Christy Pearson    PRE-OPERATIVE DIAGNOSIS:  Bilateral septic shoulder  POST-OPERATIVE DIAGNOSIS: Evidence for bilateral septic glenohumeral joint and subacromial space   PROCEDURE: 1.  Right shoulder arthroscopy with irrigation, debridement, extensive debridement of the joint and subacromial space with subacromial bursectomy 2.  Left shoulder arthroscopy with irrigation, debridement, extensive debridement of the joint and subacromial space with subacromial bursectomy   Of note, these are not truly considered incisional, or excisional debridements, but rather arthroscopic debridements, with irrigation.  SURGEON:  Johnny Bridge, MD  PHYSICIAN ASSISTANT: Merlene Pulling, PA-C, present and scrubbed throughout the case, critical for completion in a timely fashion, and for retraction, instrumentation, and closure.  ANESTHESIA:   General  PREOPERATIVE INDICATIONS:  Christy Pearson is a  29 y.o. female who is an IV drug user who presented with clinical sepsis, a septic spine, as well as bilateral shoulder pain concerning for septic arthritis.  She elected for urgent surgical intervention.  The risks benefits and alternatives were discussed with the patient preoperatively including but not limited to the risks of infection, bleeding, nerve injury, cardiopulmonary complications, the need for revision surgery, among others, and the patient was willing to proceed.  ESTIMATED BLOOD LOSS: 50 mL  OPERATIVE IMPLANTS: None  OPERATIVE FINDINGS:   In the left shoulder, the glenohumeral articular cartilage was all in good condition, the biceps was normal, rotator cuff was normal, labrum was normal, subscapularis was normal, rotator interval was normal, there was minimal fibrinous debris inside the left glenohumeral joint, it really did not look that bad.  In the left subacromial space however there was significant purulence and fibrinous debris consistent with  a septic subacromial space.  The bursa was inflamed, and the rotator cuff is intact.  There is only some mild fraying of the rotator cuff.  On the right shoulder, the glenohumeral articular space had extensive fibrinous debris and purulence.  The articular structures as well as the labrum, biceps, rotator cuff, subscapularis, supraspinatus, infraspinatus, were all intact.  The subacromial space also had significant fibrinous debris, consistent with an infected subacromial space.  There was subacromial thickening as well, no CA ligament fraying, the rotator cuff was intact from above.  Operative specimens: I sent joint and subacromial aspirations from the left shoulder, under separate specimens, although both of the specimens were somewhat diluted with arthroscopic water.  On the right shoulder, I was able to get a specimen of approximately 2 mL of joint fluid which was turbid and appeared infected that did not have arthroscopic fluid mixed in with it.  The subacromial sample did however have arthroscopic fluid in it.  OPERATIVE PROCEDURE: Patient was brought to the operating room and placed in supine position.  General anesthesia was administered.  I started with the left upper extremity because the right upper extremity had IV access.  She was placed in a beachchair position, and the left upper extremity was prepped and draped in usual sterile fashion.  Timeout performed.  Posterior portal was utilized to introduce a cannula, and cultures were taken.  I placed a second cannula anteriorly, and irrigated a total of 6 L of fluid through her joint, debriding any inflamed synovium using the arthroscopic shaver.  Fluid was also sent for Gram stain culture and sensitivity.  I then went to the subacromial space, and irrigated an additional 3 L of fluid through the subacromial space and performed a complete bursectomy.  All the fibrinous material was removed.  The instruments were removed, the portals  closed, and we turned our attention to the right side.  The same exact procedure was performed on the right side, irrigating 6 L of fluid through the right glenohumeral joint, with an additional 3 L of fluid through the right subacromial space.  The joint space on the right side looked worse from a fibrinous material standpoint.  There was also subacromial fibrinous debris.  The portals were closed, sterile dressings applied, and the patient was awakened and returned to the PACU in stable and satisfactory condition.  There were no complications and she tolerated the procedure well.

## 2019-05-25 NOTE — Progress Notes (Signed)
Pt transferred to [id number] with all belongings. Pt called mother to update her of new room.  Versie Starks, RN

## 2019-05-25 NOTE — Anesthesia Procedure Notes (Signed)
Procedure Name: Intubation Date/Time: 05/25/2019 7:40 PM Performed by: Adonis Housekeeper, CRNA Pre-anesthesia Checklist: Patient identified, Emergency Drugs available, Suction available and Patient being monitored Patient Re-evaluated:Patient Re-evaluated prior to induction Oxygen Delivery Method: Circle system utilized Preoxygenation: Pre-oxygenation with 100% oxygen Induction Type: IV induction Ventilation: Mask ventilation without difficulty Laryngoscope Size: Glidescope and 3 Grade View: Grade I Tube type: Oral Tube size: 7.0 mm Number of attempts: 1 Airway Equipment and Method: Stylet and Video-laryngoscopy Placement Confirmation: ETT inserted through vocal cords under direct vision,  positive ETCO2 and breath sounds checked- equal and bilateral Secured at: 20 cm Tube secured with: Tape Dental Injury: Teeth and Oropharynx as per pre-operative assessment

## 2019-05-25 NOTE — Anesthesia Postprocedure Evaluation (Signed)
Anesthesia Post Note  Patient: Christy Pearson  Procedure(s) Performed: MRI WITH ANESTHESIA - LUMBAR , SHOULDER (N/A )     Patient location during evaluation: PACU Anesthesia Type: General Level of consciousness: awake and sedated Pain management: pain level controlled Vital Signs Assessment: post-procedure vital signs reviewed and stable Cardiovascular status: stable Postop Assessment: no apparent nausea or vomiting Anesthetic complications: no    Last Vitals:  Vitals:   05/25/19 0553 05/25/19 0921  BP: 120/90 112/84  Pulse: 66 76  Resp: 18 18  Temp: 36.5 C 36.4 C  SpO2: 96% 97%    Last Pain:  Vitals:   05/25/19 0921  TempSrc: Oral  PainSc:    Pain Goal: Patients Stated Pain Goal: 2 (05/24/19 0836)                 Caren Macadam

## 2019-05-25 NOTE — Anesthesia Postprocedure Evaluation (Signed)
Anesthesia Post Note  Patient: Christy Pearson  Procedure(s) Performed: ARTHROSCOPY SHOULDER (Bilateral Shoulder)     Patient location during evaluation: PACU Anesthesia Type: General Level of consciousness: awake and alert Pain management: pain level controlled Vital Signs Assessment: post-procedure vital signs reviewed and stable Respiratory status: spontaneous breathing, nonlabored ventilation and respiratory function stable Cardiovascular status: blood pressure returned to baseline and stable Postop Assessment: no apparent nausea or vomiting Anesthetic complications: no    Last Vitals:  Vitals:   05/25/19 2315 05/25/19 2328  BP: 102/74 99/71  Pulse: 79 82  Resp: 15 18  Temp: 36.6 C 36.5 C  SpO2: 96% 98%    Last Pain:  Vitals:   05/25/19 2328  TempSrc: Oral  PainSc:                  Nga Rabon,W. EDMOND

## 2019-05-25 NOTE — Consult Note (Signed)
Reason for Consult:Septic shoulder Referring Physician: C Dorianna Pearson is an 29 y.o. female.  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 She notes that both shoulders and her left wrist have been hurting for 2-3 weeks. She notes that they are somewhat better today than they have been but still hurt quite a bit. She denies prior hx/o similar. ID has requested arthrocentesis to aid with control of the infection.  Past Medical History:  Diagnosis Date  . Hepatitis Christy   . Heroin abuse (HCC)   . Kidney stones     Past Surgical History:  Procedure Laterality Date  . BUBBLE STUDY  05/23/2019   Procedure: BUBBLE STUDY;  Surgeon: Christy Negus, MD;  Location: [facility] ENDOSCOPY;  Service: Cardiovascular;;  . CESAREAN SECTION  02/02/2012  . RADIOLOGY WITH ANESTHESIA N/A 05/24/2019   Procedure: MRI WITH ANESTHESIA - LUMBAR , SHOULDER;  Surgeon: Radiologist, Medication, MD;  Location: [facility] OR;  Service: Radiology;  Laterality: N/A;  . TEE WITHOUT CARDIOVERSION N/A 05/23/2019   Procedure: TRANSESOPHAGEAL ECHOCARDIOGRAM (TEE);  Surgeon: Christy Negus, MD;  Location: [facility] ENDOSCOPY;  Service: Cardiovascular;  Laterality: N/A;    History reviewed. No pertinent family history.  Social History:  reports that she has been smoking cigarettes. She has a 5.00 pack-year smoking history. She has never used smokeless tobacco. She reports current drug use. Drug: Heroin. She reports that she does not drink alcohol.  Allergies:  Allergies  Allergen Reactions  . Tylenol [Acetaminophen] Rash    Pt just reported tylenol allergy at this visit at 2338pm  . Penicillins     Medications: I have reviewed the patient's current medications.  Results for orders placed or performed during the hospital encounter of 05/18/19 (from the past 48 hour(s))  Vancomycin, peak     Status: Abnormal   Collection Time: 05/23/19  2:12 PM  Result Value Ref Range   Vancomycin Pk 29 (L) 30 -  40 ug/mL    Comment: Performed at [facility], [address] [place], [place] [address]  CBC     Status: Abnormal   Collection Time: 05/24/19  2:24 AM  Result Value Ref Range   WBC 7.9 4.0 - 10.5 K/uL   RBC 3.56 (L) 3.87 - 5.11 MIL/uL   Hemoglobin 8.9 (L) 12.0 - 15.0 g/dL   HCT 41.9 (L) 37.9 - 02.4 %   MCV 79.5 (L) 80.0 - 100.0 fL   MCH 25.0 (L) 26.0 - 34.0 pg   MCHC 31.4 30.0 - 36.0 g/dL   RDW 09.7 (H) 35.3 - 29.9 %   Platelets 410 (H) 150 - 400 K/uL   nRBC 0.0 0.0 - 0.2 %    Comment: Performed at [facility], [address] [place], [place] [address]  Comprehensive metabolic panel     Status: Abnormal   Collection Time: 05/24/19  2:24 AM  Result Value Ref Range   Sodium 143 135 - 145 mmol/L   Potassium 4.3 3.5 - 5.1 mmol/L   Chloride 112 (H) 98 - 111 mmol/L   CO2 23 22 - 32 mmol/L   Glucose, Bld 89 70 - 99 mg/dL   BUN 7 6 - 20 mg/dL   Creatinine, Ser 3.41 0.44 - 1.00 mg/dL   Calcium 8.1 (L) 8.9 - 10.3 mg/dL   Total Protein 6.0 (L) 6.5 - 8.1 g/dL   Albumin 1.4 (L) 3.5 - 5.0 g/dL   AST 962 (H) 15 - 41 U/L  ALT 141 (H) 0 - 44 U/L   Alkaline Phosphatase 173 (H) 38 - 126 U/L   Total Bilirubin 0.5 0.3 - 1.2 mg/dL   GFR calc non Af Amer >60 >60 mL/min   GFR calc Af Amer >60 >60 mL/min   Anion gap 8 5 - 15    Comment: Performed at [facility] [address], [place], [place] [address]    MR Lumbar Spine W Wo Contrast  Result Date: 05/24/2019 CLINICAL DATA:  Back pain, history of IV drug use EXAM: MRI LUMBAR SPINE WITHOUT AND WITH CONTRAST TECHNIQUE: Multiplanar and multiecho pulse sequences of the lumbar spine were obtained without and with intravenous contrast. CONTRAST:  5.85mL GADAVIST GADOBUTROL 1 MMOL/ML IV SOLN COMPARISON:  None. FINDINGS: Motion artifact is present particularly on axial imaging. Segmentation:  Standard. Alignment: Straightening of the lumbar lordosis. No significant anteroposterior listhesis. Vertebrae: Vertebral body heights are  preserved. There is diffusely abnormal T1 marrow signal. Mild marrow edema is present at the opposing L5-S1 endplates. Conus medullaris and cauda equina: Conus extends to the L1 level. Conus and cauda equina appear normal. Paraspinal and other soft tissues: Mild presacral edema. Disc levels: Mild disc desiccation at L4-L5 with small central protrusion and annular fissure. No stenosis. Mild disc desiccation and height loss at L5-S1. Superimposed mild disc bulge with endplate osteophytic ridging. There is enhancement of the disc posteriorly. There is ventral epidural enhancing T2 hyperintense soft tissue extending inferiorly from the disc to the lower S1 level with resulting mild effacement of the thecal sac. This abuts the traversing S1 nerve roots. Mild to moderate foraminal stenosis. IMPRESSION: Enhancing ventral epidural soft tissue at the L5-S1 disc level extending to the lower S1 level favored to reflect phlegmon. There is partial effacement of the thecal sac and potential compression of the traversing S1 nerve roots. Enhancement of the posterior L5-S1 disc and mild marrow edema at L5-S1 endplates is presumed to reflect discitis/osteomyelitis given above. Diffusely abnormal T1 marrow signal, which may reflect hematopoietic marrow in the setting of anemia with other marrow infiltrative process not excluded. Electronically Signed   By: Christy Pearson M.D.   On: 05/24/2019 16:42   MR SHOULDER RIGHT WO CONTRAST  Result Date: 05/23/2019 CLINICAL DATA:  Sepsis, shoulder pain EXAM: MRI OF THE RIGHT SHOULDER WITHOUT CONTRAST TECHNIQUE: Multiplanar, multisequence MR imaging of the shoulder was performed. No intravenous contrast was administered. COMPARISON:  None. FINDINGS: Technical note: Examination is significantly degraded by patient motion artifact. Patient would not tolerate any further repeated sequences. No contrast was administered. Rotator cuff: Bursal sided irregularity of the distal supraspinatus and  infraspinatus tendons which could reflect low-grade partial-thickness tearing or represent changes related to overlying bursitis within the subacromial-subdeltoid bursa. There is no full-thickness rotator cuff tear. Muscles: Rotator cuff musculature is mildly edematous. No atrophy or fatty infiltration. Biceps long head: Complex fluid distends the extra-articular biceps tendon sheath. Biceps tendon is intact without tear. Acromioclavicular Joint: Normal acromioclavicular joint. Moderate volume subacromial-subdeltoid bursitis. Glenohumeral Joint: Moderate to large glenohumeral joint effusion with synovitis. No chondral defect within the limitations of this motion limited exam. Labrum:  Grossly intact, poorly evaluated. Bones: No marrow abnormality, fracture or dislocation. No evidence to suggest osteomyelitis. Other: Extensive soft tissue edema within the visualized chest wall. Ill-defined patchy opacity within the visualized portion of the right lung, which is poorly evaluated by MRI. IMPRESSION: 1. Limited exam. 2. Moderate-to-large glenohumeral joint effusion with synovitis. Findings are suspicious for septic arthritis in the setting  of known systemic infection. Arthrocentesis is recommended. 3. Moderate subacromial-subdeltoid bursitis. 4. No evidence to suggest osteomyelitis. 5. Bursal sided irregularity of the distal supraspinatus and infraspinatus tendons which could reflect low-grade partial-thickness tearing or represent changes related to overlying bursitis within the subacromial-subdeltoid bursa. No full-thickness rotator cuff tear. These results will be called to the ordering clinician or representative by the Radiologist Assistant, and communication documented in the PACS or zVision Dashboard. Electronically Signed   By: Duanne Guess D.O.   On: 05/23/2019 12:57    Review of Systems  Constitutional: Positive for fever. Negative for chills and diaphoresis.  HENT: Negative for ear discharge, ear  pain, hearing loss and tinnitus.   Eyes: Negative for photophobia and pain.  Respiratory: Negative for cough and shortness of breath.   Cardiovascular: Negative for chest pain.  Gastrointestinal: Positive for abdominal pain. Negative for nausea and vomiting.  Genitourinary: Negative for dysuria, flank pain, frequency and urgency.  Musculoskeletal: Positive for arthralgias (Bilateral shoulders, left wrist) and back pain. Negative for myalgias and neck pain.  Neurological: Negative for dizziness and headaches.  Hematological: Does not bruise/bleed easily.  Psychiatric/Behavioral: The patient is not nervous/anxious.    Blood pressure 112/84, pulse 76, temperature 97.6 F (36.4 Christy), temperature source Oral, resp. rate 18, height 5\' 7"  (1.702 m), weight 56.5 kg, last menstrual period 04/27/2019, SpO2 97 %, unknown if currently breastfeeding. Physical Exam  Constitutional: She appears well-developed and well-nourished. No distress.  HENT:  Head: Normocephalic and atraumatic.  Eyes: Conjunctivae are normal. Right eye exhibits no discharge. Left eye exhibits no discharge. No scleral icterus.  Cardiovascular: Normal rate and regular rhythm.  Respiratory: Effort normal. No respiratory distress.  Musculoskeletal:     Cervical back: Normal range of motion.     Comments: Right shoulder, elbow, wrist, digits- no skin wounds, minimal TTP shoulder, severe pain with AROM/PROM, no instability, no blocks to motion  Sens  Ax/R/M/U intact  Mot   Ax/ R/ PIN/ M/ AIN/ U intact  Rad 2+  Left shoulder, elbow, wrist, digits- no skin wounds, minimal TTP shoulder, severe pain with AROM/PROM, wrist with minimal TTP, excellent ROM w/o pain, no instability, no blocks to motion  Sens  Ax/R/M/U intact  Mot   Ax/ R/ PIN/ M/ AIN/ U intact  Rad 2+  Neurological: She is alert.  Skin: Skin is warm and dry. She is not diaphoretic.  Psychiatric: She has a normal mood and affect. Her behavior is normal.     Assessment/Plan: Bilateral septic shoulders -- Plan I&D by Dr. 06/25/2019 this evening. Please keep NPO. Multiple medical problems including endocarditis, MRSA bacteremia, septic pulmonary emboli, IVDU, and hepatitis Christy -- per primary service    Christy Saucier, PA-[facility] [phone] 05/25/2019, 10:26 AM

## 2019-05-25 NOTE — Transfer of Care (Signed)
Immediate Anesthesia Transfer of Care Note  Patient: Christy Pearson  Procedure(s) Performed: ARTHROSCOPY SHOULDER (Bilateral Shoulder)  Patient Location: PACU  Anesthesia Type:General  Level of Consciousness: awake, alert , oriented and patient cooperative  Airway & Oxygen Therapy: Patient Spontanous Breathing  Post-op Assessment: Report given to RN and Post -op Vital signs reviewed and stable  Post vital signs: Reviewed and stable  Last Vitals:  Vitals Value Taken Time  BP 105/71 05/25/19 2230  Temp    Pulse 89 05/25/19 2231  Resp 19 05/25/19 2231  SpO2 93 % 05/25/19 2231  Vitals shown include unvalidated device data.  Last Pain:  Vitals:   05/25/19 1626  TempSrc:   PainSc: 8       Patients Stated Pain Goal: 2 (05/24/19 0836)  Complications: No apparent anesthesia complications

## 2019-05-25 NOTE — Progress Notes (Addendum)
PROGRESS NOTE  Analyse Angst KNL:[id number] DOB: Jul 08, 1990 DOA: 05/18/2019 PCP: Patient, No Pcp Per  Brief History   The patient is a 29 yr old woman with a medical history significant for hepatitis C, IV heroin, crystal meth abuse, and kidney stone. She stated using about 14 mg methadone from the street currently. She is on 30 mg daily here. She was transferred to [facility] for investigation of bacteremia and endocarditis, and for severe sepsis. She is found to have cavitary pulmonary lesions that likely represent septic emboli, 2/2 blood cultures drawn on 05/18/2019 which have grown out MRSA. She also has significant pain in her thoracic spine, left shoulder, wrist and pelvis. 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  -Repeat blood cultures from 1/29 are negative to date  Septic shoulder arthritis - Orthopedics consulted.  Dr Dion Saucier planning to take to OR later today for arthrocentesis and sending fluid for cell count and gram stain studies.  Cultures likely will not be helpful as she has been on antibiotics for several days.   S1 Nerve Compression - Neurosurgery consult requested. Dr. Venetia Maxon will see her.   Abnormal LFTs -worsening, ? Sepsis related -h/o Hep C, but would not expect this to worsen -Abdominal exam is benign, monitor -?  Passive congestion of the liver from TR -Liver ultrasound was benign, continue to monitor  UTI -Urine culture grew E. coli, completed  ceftriaxone course for this 2/1  Anemia -Secondary to chronic disease and iron deficiency -Transfused 1 unit of PRBC this admission -Give IV iron x1 -Monitor   History of hepatitis C.  Active IV drug abuse -Currently using crystal meth -Continue methadone  DVT prophylaxis:  lovenox Code Status: Full Code Family Communication: None available Disposition Plan: Work up for suspected widespread osteomyelitis and tricuspid endocarditis. Further disposition will depend on results of work up and plans for treatment. Due to the patient's history of heroin abuse, I doubt that outpatient treatment of her MRSA bacteremia/endocarditis/osteomyelitis would be appropriate.  Consultants  . Infectious disease . Cardiology . Orthopedics Dion Saucier)  Procedures  . Transfusion of one unit of PRBC's.  Antibiotics   Anti-infectives (From admission, onward)   Start     Dose/Rate Route Frequency Ordered Stop   05/25/19 1230  amoxicillin (AMOXIL) capsule 500 mg     500 mg Oral  Once 05/25/19 1120     05/21/19 1000  vancomycin (VANCOCIN) IVPB 1000 mg/200 mL premix     1,000 mg 200 mL/hr over 60 Minutes Intravenous Every 12 hours 05/21/19 0948     05/20/19 1400  cefTRIAXone (ROCEPHIN) 2 g in sodium chloride 0.9 % 100 mL IVPB  Status:  Discontinued     2 g 200 mL/hr over 30 Minutes Intravenous Every 24 hours 05/20/19 1328 05/22/19 0947   05/19/19 2200  levofloxacin (LEVAQUIN) IVPB 750 mg  Status:  Discontinued     750 mg 100 mL/hr over 90 Minutes Intravenous Every 24 hours 05/19/19 0212 05/19/19 0213   05/19/19 2200  levofloxacin (LEVAQUIN) IVPB 500 mg  Status:  Discontinued  500 mg 100 mL/hr over 60 Minutes Intravenous Every 24 hours 05/19/19 0213 05/19/19 0539   05/19/19 1000  vancomycin (VANCOREADY) IVPB 750 mg/150 mL  Status:  Discontinued     750 mg 150 mL/hr over 60 Minutes Intravenous Every 12 hours 05/19/19 0539 05/21/19 0948   05/19/19 0600  ceFEPIme (MAXIPIME) 2 g in sodium chloride  0.9 % 100 mL IVPB  Status:  Discontinued     2 g 200 mL/hr over 30 Minutes Intravenous Every 8 hours 05/19/19 0539 05/19/19 2348   05/19/19 0130  vancomycin (VANCOCIN) IVPB 1000 mg/200 mL premix     1,000 mg 200 mL/hr over 60 Minutes Intravenous  Once 05/19/19 0122 05/19/19 0253   05/18/19 2345  levofloxacin (LEVAQUIN) IVPB 750 mg     750 mg 100 mL/hr over 90 Minutes Intravenous  Once 05/18/19 2338 05/19/19 0140     Subjective  No specific complaints at this time.   Objective   Vitals:  Vitals:   05/25/19 0921 05/25/19 1243  BP: 112/84 96/68  Pulse: 76 80  Resp: 18 18  Temp: 97.6 F (36.4 C) 97.7 F (36.5 C)  SpO2: 97% 97%   Exam:  Gen: awake, alert, NAD. Cooperative.  HEENT: NCAT. Neck supple.  No JVD, no icterus Lungs: BBS, CTAB CVS: normal S1-S2, regular rhythm, tachycardic, diastolic murmur Abd: soft, Non tender, non distended, BS present Extremities: Right shoulder tender with decreased range of motion, some tenderness in her left shoulder and left wrist as well Skin: no new rashes  I have personally reviewed the following:   Today's Data  . T Max 24, Vitals, CMP, CBC  Micro Data  . Blood cultures x 2 from 05/18/2019 MRSA . Blood cultures x 2 from 05/18/2019 - No growth . Urine Culture 05/18/2019 - E. coli  Imaging  . CT renal stone study: Multiple cavitary and non-cavitary pulmonary lesions consistent with septic emboli. . MRI MRI of shoulders reveal large joint effusion indicating at least septic arthritis. MRI of spine also reveals acute discitis/osteomyelitis at L5-S1 level with ventral epidural enhancement representing phlegmon with partial effacement of the thecal sac and potential compression of traversing S1 nerve roots.  Cardiology Data  . Echocardiogram 05/19/2019 demonstrated severe tricuspid regurgitation and abnormalities suspicious for valvular vegetation due to endocarditis. . TEE: 05/23/19 Tricuspid valve endocarditis with MRSA bacteremia, with  likely paradoxical septic embolization (renal septic emboli).  Scheduled Meds: . amoxicillin  500 mg Oral Once  . chlorhexidine  60 mL Topical Once  . enoxaparin (LOVENOX) injection  40 mg Subcutaneous Q24H  . ferrous sulfate  325 mg Oral BID  . methadone  15 mg Oral Daily  . povidone-iodine  2 application Topical Once  . prenatal multivitamin  1 tablet Oral Daily   Continuous Infusions: . lactated ringers 10 mL/hr at 05/24/19 0902  . vancomycin 1,000 mg (05/25/19 0924)    Principal Problem:   MRSA bacteremia Active Problems:   Injection of illicit drug within last 12 months   Hepatitis C antibody positive, s/p spontaneously cleared infection   Sepsis (HCC)   Septic pulmonary embolism (HCC)   Substance use disorder   Suspected endocarditis   IV drug abuse (HCC)   Septic embolism (HCC)   Staphylococcal arthritis of right shoulder (HCC)   Staphylococcal arthritis of left wrist (HCC)   PFO (patent foramen ovale)   Endocarditis of tricuspid valve   LOS: 6 days   A & P  Rachard Isidro MD [facility] How to contact  the [facility] Attending or Consulting provider [place] or covering provider during after hours [place], for this patient?  1. Check the care team in [facility] and look for a) attending/consulting [facility] provider listed and b) the [facility] team listed 2. Log into [email or web address] and use [place]'s universal password to access. If you do not have the password, please contact the hospital operator. 3. Locate the [facility] provider you are looking for under [facility] and page to a number that you can be directly reached. 4. If you still have difficulty reaching the provider, please page the [facility] (Director on Call) for the Hospitalists listed on amion for assistance.  05/22/2019, 12:29 PM  LOS: 3 days

## 2019-05-25 NOTE — Progress Notes (Signed)
Pharmacy Penicillin Allergy Assessment   History of B-lactam allergy: Christy Pearson had a reaction to penicillins back in 2009 that she does not recall specifically due to her young age. She does not recall having to go to the hospital or doctor's office for treatment. Per Epic, patient had vomiting and a small rash back in 2009.   It was noted that Christy Pearson had amoxicillin back in 2015 for a dental infection and had no reaction at that time. Christy Pearson also self-reports that she can take amoxicillin. I explained that she is no longer allergic to penicillin if she tolerates amoxicillin but she still prefers not to take straight penicillin in the future.   Christy Pearson is safe to take derivatives of penicillin including amoxicillin and Zosyn but prefers not to have straight penicillin if possible. Will plan on giving a 1 time dose of amoxicillin to document tolerance in our health system.  Plan Give 1 time dose of amoxicillin  Discussed plan with nurse Will remove allergy if no reaction   Christy Pearson, PharmD, BCPS, BCIDP Infectious Diseases Clinical Pharmacist Phone: [phone] 05/25/2019 11:09 AM

## 2019-05-25 NOTE — Progress Notes (Signed)
[facility]  Date of Admission:  05/18/2019      Total days of antibiotics 8  Vancomycin, day 8            ASSESSMENT: Christy Pearson is a 29 y.o. female with MRSA bacteremia in the setting of native tricuspid valve endocarditis with TEE indicating possible PFO and 1 x 0.5 cm vegetation. She has bilateral cavitary septic emboli findings on chest CT without hypoxia. Dr. Cliffton Asters to evaluate for possible surgical intervention given remaining vegetation and possible PFO. ?candidacy for percutaneous debulking of valve.   MRI of shoulders reveal large joint effusion indicating at least septic arthritis. Orthopedic team has seen her and Dr. Dion Saucier recommending bilateral shoulder I&D - appreciate their assistance. Her left wrist appears improved with swelling and function - report on this is still pending.   MRI of spine also reveals acute discitis/osteomyelitis at L5-S1 level with ventral epidural enhancement representing phlegmon with partial effacement of the thecal sac and potential compression of traversing S1 nerve roots. On exam she describes some pain to the top of the left thigh but no saddle anesthesia, altered voiding/bowels and can raise her hips up off the bed independently. Would have neurosurgery see her as well to ensure medical therapy alone is acceptable to treat this infection.   Injection drug use with substance use disorder, she has been getting clean needles she uses only once with harm reduction/needle exchange clinic locally. 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   She has no Hepatitis C viral RNA detected indicating she has already spontaneously cleared without DAA therapy. Hep B surface antigen negative. Discussed results with her today.   Creatinine remains stable. Was still getting ketorolac with vanc up until 2/3.  Lab Results  Component Value Date   CREATININE 0.53 05/24/2019   CREATININE 0.55 05/23/2019   CREATININE 0.65 05/22/2019      PLAN: 1. Continue vancomycin  2. Hold on PICC for now 3. Follow up TCTS recs 4. Would call Neurosurgery to evaluate with possible nerve root compression on MRI  5. Surgery today per orthopedics for b/l shoulder I&D 6. Follow MRI report L wrist     Principal Problem:   MRSA bacteremia Active Problems:   Septic pulmonary embolism (HCC)   Suspected endocarditis   Injection of illicit drug within last 12 months   Hepatitis C antibody positive, s/p spontaneously cleared infection   Sepsis (HCC)   Substance use disorder   IV drug abuse (HCC)   Septic embolism (HCC)   Staphylococcal arthritis of right shoulder (HCC)   Staphylococcal arthritis of left wrist (HCC)   PFO (patent foramen ovale)   Endocarditis of tricuspid valve    enoxaparin (LOVENOX) injection  40 mg Subcutaneous Q24H   ferrous sulfate  325 mg Oral BID   methadone  15 mg Oral Daily   prenatal multivitamin  1 tablet Oral Daily     SUBJECTIVE: Afebrile since 2/3 AM. WBC normalized. Still with severe pain and limited ROM b/l shoulders. Top of her left thigh is a bit painful but no numbness to inner thighs/buttocks.  Able to void/pass BM normally.  No other complaints at this time.  L wrist feels a little better with some improvement in function.    Review of Systems: Review of Systems  Constitutional: Positive for malaise/fatigue. Negative for chills and fever.  Respiratory: Negative for cough and shortness of breath.   Cardiovascular: Negative for chest pain.  Gastrointestinal: Negative for abdominal pain, diarrhea, nausea and vomiting.  Genitourinary: Negative for dysuria.  Musculoskeletal: Positive for back pain and joint pain.  Skin: Negative for itching and rash.  Neurological: Negative for dizziness, sensory change, weakness and headaches.    Allergies  Allergen Reactions   Tylenol [Acetaminophen] Rash    Pt just reported tylenol allergy at this visit at  2338pm   Penicillins     OBJECTIVE: Vitals:   05/24/19 1600 05/24/19 2135 05/25/19 0553 05/25/19 0921  BP:  108/82 120/90 112/84  Pulse: 81 76 66 76  Resp: (!) 21 20 18 18   Temp:  98.1 F (36.7 C) 97.7 F (36.5 C) 97.6 F (36.4 C)  TempSrc:  Oral Oral Oral  SpO2: 96% 97% 96% 97%  Weight:      Height:       Body mass index is 19.51 kg/m.  Physical Exam Constitutional:      Comments: Appears uncomfortably resting in bed.   HENT:     Mouth/Throat:     Mouth: Mucous membranes are moist.     Pharynx: Oropharynx is clear.  Eyes:     General: No scleral icterus. Cardiovascular:     Rate and Rhythm: Normal rate and regular rhythm.     Heart sounds: Murmur present.  Abdominal:     General: Bowel sounds are normal. There is no distension.  Musculoskeletal:     Right shoulder: Tenderness present. Decreased range of motion.     Left shoulder: Tenderness present. Decreased range of motion.     Left wrist: Tenderness (improved) present. No swelling. Decreased range of motion (able to eat food).  Skin:    General: Skin is warm and dry.     Capillary Refill: Capillary refill takes less than 2 seconds.     Coloration: Skin is not jaundiced.  Neurological:     Mental Status: She is alert and oriented to person, place, and time.     Lab Results Lab Results  Component Value Date   WBC 7.9 05/24/2019   HGB 8.9 (L) 05/24/2019   HCT 28.3 (L) 05/24/2019   MCV 79.5 (L) 05/24/2019   PLT 410 (H) 05/24/2019    Lab Results  Component Value Date   CREATININE 0.53 05/24/2019   BUN 7 05/24/2019   NA 143 05/24/2019   K 4.3 05/24/2019   CL 112 (H) 05/24/2019   CO2 23 05/24/2019    Lab Results  Component Value Date   ALT 141 (H) 05/24/2019   AST 161 (H) 05/24/2019   ALKPHOS 173 (H) 05/24/2019   BILITOT 0.5 05/24/2019     Microbiology: Recent Results (from the past 240 hour(s))  Blood Culture (routine x 2)     Status: Abnormal   Collection Time: 05/18/19 10:52 PM    Specimen: BLOOD  Result Value Ref Range Status   Specimen Description   Final    BLOOD BLOOD LEFT ARM Performed at [facility], [address] [place], [place] [address]    Special Requests   Final    BOTTLES DRAWN AEROBIC AND ANAEROBIC Blood Culture adequate volume Performed at [facility], [address] [place], [place] [address]    Culture  Setup Time   Final    IN BOTH AEROBIC AND ANAEROBIC BOTTLES GRAM POSITIVE COCCI CRITICAL RESULT CALLED TO, READ BACK BY AND VERIFIED WITH: Evelena Peat [facility] 05/19/19 2254 JDW Performed at [facility], [address] [place], [place]  [address]    Culture METHICILLIN RESISTANT STAPHYLOCOCCUS AUREUS (A)  Final   Report Status 05/21/2019 FINAL  Final   Organism ID, Bacteria METHICILLIN RESISTANT STAPHYLOCOCCUS AUREUS  Final      Susceptibility   Methicillin resistant staphylococcus aureus - MIC*    CIPROFLOXACIN <=0.5 SENSITIVE Sensitive     ERYTHROMYCIN >=8 RESISTANT Resistant     GENTAMICIN <=0.5 SENSITIVE Sensitive     OXACILLIN >=4 RESISTANT Resistant     TETRACYCLINE <=1 SENSITIVE Sensitive     VANCOMYCIN 1 SENSITIVE Sensitive     TRIMETH/SULFA <=10 SENSITIVE Sensitive     CLINDAMYCIN >=8 RESISTANT Resistant     RIFAMPIN <=0.5 SENSITIVE Sensitive     Inducible Clindamycin NEGATIVE Sensitive     * METHICILLIN RESISTANT STAPHYLOCOCCUS AUREUS  Blood Culture ID Panel (Reflexed)     Status: Abnormal   Collection Time: 05/18/19 10:52 PM  Result Value Ref Range Status   Enterococcus species NOT DETECTED NOT DETECTED Final   Listeria monocytogenes NOT DETECTED NOT DETECTED Final   Staphylococcus species DETECTED (A) NOT DETECTED Final    Comment: CRITICAL RESULT CALLED TO, READ BACK BY AND VERIFIED WITH: G ABBOTT PHARMD 05/19/19 2254 JDW    Staphylococcus aureus (BCID) DETECTED (A) NOT DETECTED Final    Comment: Methicillin (oxacillin)-resistant Staphylococcus aureus (MRSA). MRSA is predictably resistant to  beta-lactam antibiotics (except ceftaroline). Preferred therapy is vancomycin unless clinically contraindicated. Patient requires contact precautions if  hospitalized. CRITICAL RESULT CALLED TO, READ BACK BY AND VERIFIED WITH: G ABBOTT PHARMD 05/19/19 2254 JDW    Methicillin resistance DETECTED (A) NOT DETECTED Final    Comment: CRITICAL RESULT CALLED TO, READ BACK BY AND VERIFIED WITH: G ABBOTT PHARMD 05/19/19 2254 JDW    Streptococcus species NOT DETECTED NOT DETECTED Final   Streptococcus agalactiae NOT DETECTED NOT DETECTED Final   Streptococcus pneumoniae NOT DETECTED NOT DETECTED Final   Streptococcus pyogenes NOT DETECTED NOT DETECTED Final   Acinetobacter baumannii NOT DETECTED NOT DETECTED Final   Enterobacteriaceae species NOT DETECTED NOT DETECTED Final   Enterobacter cloacae complex NOT DETECTED NOT DETECTED Final   Escherichia coli NOT DETECTED NOT DETECTED Final   Klebsiella oxytoca NOT DETECTED NOT DETECTED Final   Klebsiella pneumoniae NOT DETECTED NOT DETECTED Final   Proteus species NOT DETECTED NOT DETECTED Final   Serratia marcescens NOT DETECTED NOT DETECTED Final   Haemophilus influenzae NOT DETECTED NOT DETECTED Final   Neisseria meningitidis NOT DETECTED NOT DETECTED Final   Pseudomonas aeruginosa NOT DETECTED NOT DETECTED Final   Candida albicans NOT DETECTED NOT DETECTED Final   Candida glabrata NOT DETECTED NOT DETECTED Final   Candida krusei NOT DETECTED NOT DETECTED Final   Candida parapsilosis NOT DETECTED NOT DETECTED Final   Candida tropicalis NOT DETECTED NOT DETECTED Final    Comment: Performed at [facility], [place]. [address] [place], [place] [address]  Urine culture     Status: Abnormal   Collection Time: 05/18/19 11:38 PM   Specimen: In/Out Cath Urine  Result Value Ref Range Status   Specimen Description   Final    IN/OUT CATH URINE Performed at [facility], [address] [place], [place] [address]    Special Requests    Final    NONE Performed at [facility], [address] [place], [place] [address]    Culture >=100,000 COLONIES/mL ESCHERICHIA COLI (A)  Final   Report Status 05/21/2019 FINAL  Final   Organism ID, Bacteria  ESCHERICHIA COLI (A)  Final      Susceptibility   Escherichia coli - MIC*    AMPICILLIN >=32 RESISTANT Resistant     CEFAZOLIN >=64 RESISTANT Resistant     CEFTRIAXONE 0.5 SENSITIVE Sensitive     CIPROFLOXACIN <=0.25 SENSITIVE Sensitive     GENTAMICIN <=1 SENSITIVE Sensitive     IMIPENEM <=0.25 SENSITIVE Sensitive     NITROFURANTOIN <=16 SENSITIVE Sensitive     TRIMETH/SULFA <=20 SENSITIVE Sensitive     AMPICILLIN/SULBACTAM >=32 RESISTANT Resistant     PIP/TAZO 8 SENSITIVE Sensitive     * >=100,000 COLONIES/mL ESCHERICHIA COLI  SARS CORONAVIRUS 2 (TAT 6-24 HRS) Nasopharyngeal In/Out Cath Urine     Status: None   Collection Time: 05/18/19 11:39 PM   Specimen: In/Out Cath Urine; Nasopharyngeal  Result Value Ref Range Status   SARS Coronavirus 2 NEGATIVE NEGATIVE Final    Comment: (NOTE) SARS-CoV-2 target nucleic acids are NOT DETECTED. The SARS-CoV-2 RNA is generally detectable in upper and lower respiratory specimens during the acute phase of infection. Negative results do not preclude SARS-CoV-2 infection, do not rule out co-infections with other pathogens, and should not be used as the sole basis for treatment or other patient management decisions. Negative results must be combined with clinical observations, patient history, and epidemiological information. The expected result is Negative. Fact Sheet for Patients: HairSlick.no Fact Sheet for Healthcare Providers: quierodirigir.com This test is not yet approved or cleared by the [place] FDA and  has been authorized for detection and/or diagnosis of SARS-CoV-2 by FDA under an Emergency Use Authorization (EUA). This EUA will remain  in effect (meaning  this test can be used) for the duration of the COVID-19 declaration under Section 56 4(b)(1) of the Act, 21 U.S.C. section 360bbb-3(b)(1), unless the authorization is terminated or revoked sooner. Performed at [facility], [address] [place], [place] [address]   SARS Coronavirus 2 Ag (30 min TAT) - In/Out Cath Urine     Status: None   Collection Time: 05/18/19 11:40 PM   Specimen: In/Out Cath Urine; Nasal Swab  Result Value Ref Range Status   SARS Coronavirus 2 Ag NEGATIVE NEGATIVE Final    Comment: (NOTE) SARS-CoV-2 antigen NOT DETECTED.  Negative results are presumptive.  Negative results do not preclude SARS-CoV-2 infection and should not be used as the sole basis for treatment or other patient management decisions, including infection  control decisions, particularly in the presence of clinical signs and  symptoms consistent with COVID-19, or in those who have been in contact with the virus.  Negative results must be combined with clinical observations, patient history, and epidemiological information. The expected result is Negative. Fact Sheet for Patients: https://sanders-williams.net/ Fact Sheet for Healthcare Providers: https://martinez.com/ This test is not yet approved or cleared by the [place] FDA and  has been authorized for detection and/or diagnosis of SARS-CoV-2 by FDA under an Emergency Use Authorization (EUA).  This EUA will remain in effect (meaning this test can be used) for the duration of  the COVID-19 de claration under Section 564(b)(1) of the Act, 21 U.S.C. section 360bbb-3(b)(1), unless the authorization is terminated or revoked sooner. Performed at [facility], [address] [place], [place] [address]   Blood Culture (routine x 2)     Status: Abnormal   Collection Time: 05/18/19 11:50 PM   Specimen: BLOOD  Result Value Ref Range Status   Specimen Description   Final    BLOOD BLOOD RIGHT  ARM Performed at [facility], [address] [place], [place] [address]    Special Requests   Final    BOTTLES DRAWN AEROBIC AND ANAEROBIC Blood Culture adequate volume Performed at [facility], [address] [place], [place] [address]    Culture  Setup Time   Final    IN BOTH AEROBIC AND ANAEROBIC BOTTLES GRAM POSITIVE COCCI CRITICAL RESULT CALLED TO, READ BACK BY AND VERIFIED WITH: G ABBOTT PHARMD 05/19/19 2254 JDW    Culture (A)  Final    STAPHYLOCOCCUS AUREUS SUSCEPTIBILITIES PERFORMED ON PREVIOUS CULTURE WITHIN THE LAST 5 DAYS. Performed at [facility], [address] [place], [place] [address]    Report Status 05/21/2019 FINAL  Final  Culture, blood (single) w Reflex to ID Panel     Status: None   Collection Time: 05/19/19  1:40 AM   Specimen: BLOOD LEFT ARM  Result Value Ref Range Status   Specimen Description   Final    BLOOD LEFT ARM Performed at [facility], [address] [place], [place] [address]    Special Requests   Final    BOTTLES DRAWN AEROBIC AND ANAEROBIC Blood Culture adequate volume Performed at [facility], [address] [place], [place] [address]    Culture   Final    NO GROWTH 5 DAYS Performed at [facility], [address], [place], [place] [address]    Report Status 05/24/2019 FINAL  Final  Respiratory Panel by PCR     Status: None   Collection Time: 05/19/19  5:58 AM   Specimen: Nasopharyngeal Swab; Respiratory  Result Value Ref Range Status   Adenovirus NOT DETECTED NOT DETECTED Final   Coronavirus 229E NOT DETECTED NOT DETECTED Final    Comment: (NOTE) The Coronavirus on the Respiratory Panel, DOES NOT test for the novel  Coronavirus (2019 nCoV)    Coronavirus HKU1 NOT DETECTED NOT DETECTED Final   Coronavirus NL63 NOT DETECTED NOT DETECTED Final   Coronavirus OC43 NOT DETECTED NOT DETECTED Final   Metapneumovirus NOT DETECTED NOT DETECTED Final   Rhinovirus / Enterovirus  NOT DETECTED NOT DETECTED Final   Influenza A NOT DETECTED NOT DETECTED Final   Influenza B NOT DETECTED NOT DETECTED Final   Parainfluenza Virus 1 NOT DETECTED NOT DETECTED Final   Parainfluenza Virus 2 NOT DETECTED NOT DETECTED Final   Parainfluenza Virus 3 NOT DETECTED NOT DETECTED Final   Parainfluenza Virus 4 NOT DETECTED NOT DETECTED Final   Respiratory Syncytial Virus NOT DETECTED NOT DETECTED Final   Bordetella pertussis NOT DETECTED NOT DETECTED Final   Chlamydophila pneumoniae NOT DETECTED NOT DETECTED Final   Mycoplasma pneumoniae NOT DETECTED NOT DETECTED Final    Comment: Performed at [facility], [address], [place], [place] [address]  Culture, blood (routine x 2)     Status: None   Collection Time: 05/19/19  7:15 AM   Specimen: BLOOD  Result Value Ref Range Status   Specimen Description BLOOD LEFT ANTECUBITAL  Final   Special Requests   Final    BOTTLES DRAWN AEROBIC ONLY Blood Culture results may not be optimal due to an inadequate volume of blood received in culture bottles   Culture   Final    NO GROWTH 5 DAYS Performed at [facility], [address], [place], [place] [address]    Report Status 05/24/2019 FINAL  Final  Culture, blood (  routine x 2)     Status: None   Collection Time: 05/19/19  7:20 AM   Specimen: BLOOD RIGHT HAND  Result Value Ref Range Status   Specimen Description BLOOD RIGHT HAND  Final   Special Requests   Final    BOTTLES DRAWN AEROBIC ONLY Blood Culture results may not be optimal due to an inadequate volume of blood received in culture bottles   Culture   Final    NO GROWTH 5 DAYS Performed at [facility], [address] [place], [place] [address]    Report Status 05/24/2019 FINAL  Final     Rexene AlbertsStephanie Khamarion Bjelland, MSN, NP-C [facility]  [email or web address] Pager: [phone] Office: [phone] [facility] [address] Line: [phone]

## 2019-05-26 DIAGNOSIS — M00012 Staphylococcal arthritis, left shoulder: Secondary | ICD-10-CM | POA: Diagnosis not present

## 2019-05-26 DIAGNOSIS — I2690 Septic pulmonary embolism without acute cor pulmonale: Secondary | ICD-10-CM | POA: Diagnosis not present

## 2019-05-26 DIAGNOSIS — F1990 Other psychoactive substance use, unspecified, uncomplicated: Secondary | ICD-10-CM | POA: Diagnosis not present

## 2019-05-26 DIAGNOSIS — I330 Acute and subacute infective endocarditis: Secondary | ICD-10-CM | POA: Diagnosis not present

## 2019-05-26 DIAGNOSIS — I079 Rheumatic tricuspid valve disease, unspecified: Secondary | ICD-10-CM | POA: Diagnosis not present

## 2019-05-26 DIAGNOSIS — R7881 Bacteremia: Secondary | ICD-10-CM | POA: Diagnosis not present

## 2019-05-26 DIAGNOSIS — M4627 Osteomyelitis of vertebra, lumbosacral region: Secondary | ICD-10-CM

## 2019-05-26 DIAGNOSIS — M4647 Discitis, unspecified, lumbosacral region: Secondary | ICD-10-CM

## 2019-05-26 DIAGNOSIS — B9562 Methicillin resistant Staphylococcus aureus infection as the cause of diseases classified elsewhere: Secondary | ICD-10-CM | POA: Diagnosis not present

## 2019-05-26 DIAGNOSIS — M009 Pyogenic arthritis, unspecified: Secondary | ICD-10-CM

## 2019-05-26 DIAGNOSIS — R768 Other specified abnormal immunological findings in serum: Secondary | ICD-10-CM | POA: Diagnosis not present

## 2019-05-26 NOTE — Consult Note (Signed)
ORTHOPAEDIC CONSULTATION  REQUESTING PHYSICIAN: Dorcas Carrow, MD  PCP:  Patient, No Pcp Per  Chief Complaint: Left wrist pain  HPI: Christy Pearson is a 29 y.o. female who complains of left wrist pain.  Patient is a history of IV drug use and was admitted for metastatic MRSA infection.  She had bilateral septic shoulders that were treated by Dr. Dion Saucier with arthroscopic I&D's.  She did also have some left wrist pain at admission and an MRI was obtained.  There was some fluid seen on the MRI around the midcarpal joint so hand surgery was recommend to be consulted.  She states the left wrist did hurt her at 1 point several weeks ago but much of the pain has resolved.  On exam today she currently has an IV in the left wrist.  She denies any pain or swelling in the hand or wrist.  She denies numbness or tingling.  Past Medical History:  Diagnosis Date  . Hepatitis C   . Heroin abuse (HCC)   . Kidney stones    Past Surgical History:  Procedure Laterality Date  . BUBBLE STUDY  05/23/2019   Procedure: BUBBLE STUDY;  Surgeon: Elder Negus, MD;  Location: [facility] ENDOSCOPY;  Service: Cardiovascular;;  . CESAREAN SECTION  02/02/2012  . RADIOLOGY WITH ANESTHESIA N/A 05/24/2019   Procedure: MRI WITH ANESTHESIA - LUMBAR , SHOULDER;  Surgeon: Radiologist, Medication, MD;  Location: [facility] OR;  Service: Radiology;  Laterality: N/A;  . SHOULDER ARTHROSCOPY Bilateral 05/25/2019   Procedure: ARTHROSCOPY SHOULDER;  Surgeon: Teryl Lucy, MD;  Location: [facility] OR;  Service: Orthopedics;  Laterality: Bilateral;  . TEE WITHOUT CARDIOVERSION N/A 05/23/2019   Procedure: TRANSESOPHAGEAL ECHOCARDIOGRAM (TEE);  Surgeon: Elder Negus, MD;  Location: [facility] ENDOSCOPY;  Service: Cardiovascular;  Laterality: N/A;   Social History   Socioeconomic History  . Marital status: Married    Spouse name: Not on file  . Number of children: Not on file  . Years of education: Not on file  . Highest education level: Not on  file  Occupational History  . Not on file  Tobacco Use  . Smoking status: Current Every Day Smoker    Packs/day: 1.00    Years: 5.00    Pack years: 5.00    Types: Cigarettes  . Smokeless tobacco: Never Used  Substance and Sexual Activity  . Alcohol use: No  . Drug use: Yes    Types: Heroin    Comment: Heroin, Xanax  . Sexual activity: Not Currently  Other Topics Concern  . Not on file  Social History Narrative  . Not on file   Social Determinants of Health   Financial Resource Strain:   . Difficulty of Paying Living Expenses: Not on file  Food Insecurity:   . Worried About Programme researcher, broadcasting/film/video in the Last Year: Not on file  . Ran Out of Food in the Last Year: Not on file  Transportation Needs:   . Lack of Transportation (Medical): Not on file  . Lack of Transportation (Non-Medical): Not on file  Physical Activity:   . Days of Exercise per Week: Not on file  . Minutes of Exercise per Session: Not on file  Stress:   . Feeling of Stress : Not on file  Social Connections:   . Frequency of Communication with Friends and Family: Not on file  . Frequency of Social Gatherings with Friends and Family: Not on file  . Attends Religious Services: Not on file  .  Active Member of Clubs or Organizations: Not on file  . Attends Archivist Meetings: Not on file  . Marital Status: Not on file   History reviewed. No pertinent family history. Allergies  Allergen Reactions  . Tylenol [Acetaminophen] Rash    Pt just reported tylenol allergy at this visit at 48pm   Prior to Admission medications   Medication Sig Start Date End Date Taking? Authorizing Provider  methadone (DOLOPHINE) 10 MG tablet Take 55 mg by mouth daily. Pt states this was from an old rx.   Yes [provider]  ferrous sulfate (FERROUSUL) 325 (65 FE) MG tablet Take 1 tablet (325 mg total) by mouth 2 (two) times daily. Patient not taking: Reported on 05/19/2019 10/02/15   Gwynne Edinger, MD   ibuprofen (ADVIL) 600 MG tablet Take 600 mg by mouth every 6 (six) hours as needed.    [provider]  Magnesium Salicylate 865 MG TABS Take 325 mg by mouth daily.    [provider]  Prenatal Multivit-Min-Fe-FA (PRENATAL VITAMINS) 0.8 MG tablet Take 1 tablet by mouth daily. Patient not taking: Reported on 05/19/2019 10/02/15   Gwynne Edinger, MD   No results found.  Positive ROS: All other systems have been reviewed and were otherwise negative with the exception of those mentioned in the HPI and as above.  Physical Exam: General: Alert, no acute distress Cardiovascular: No pedal edema Respiratory: No cyanosis, no use of accessory musculature Skin: No lesions in the area of chief complaint Psychiatric: Patient is competent for consent with normal mood and affect Lymphatic: No axillary or cervical lymphadenopathy  MUSCULOSKELETAL: Bilateral shoulders have a well-padded surgical dressings.  In terms of the left wrist, there is an IV over the dorsal wrist.  There is no swelling, erythema or signs of infection.  She has no tenderness to palpation except right around the insertion site of her IV.  This includes directly over the dorsal, midcarpal joint.  She has full wrist flexion and extension compared to the contralateral side.  Additionally she has full pronation supination compared to the contralateral side.  She has full range of motion of her digits.  There are no palpable fluid collections.  Her fingertips are all warm well perfused with brisk capillary refill.  Her sensation is intact to light touch throughout all digits.  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  Plan: -I reviewed the MRI images as well as the report.  There is some signal within the midcarpal joint but overall the exam was rather benign. -Clinically today she has no signs of an acute infection in her wrist.  I have no concern for a septic wrist arthritis.  I see no indication for  aspiration or surgical intervention -I recommend she continue with IV antibiotics per infectious disease recommendations -She can use the left hand and wrist as tolerated. -She can follow-up with me on as-needed basis. -Please call with any questions, concerns or changes in her exam.    Verner Mould, MD [phone]   05/26/2019 4:50 PM

## 2019-05-26 NOTE — Evaluation (Signed)
Occupational Therapy Evaluation Patient Details Name: Christy Pearson MRN: [id number] DOB: 28-Dec-1990 Today's Date: 05/26/2019    History of Present Illness 29 yo female admitted with fevere chills and back pain. 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 PMH hepatitis C, Kidney stones, IV drug abuse,    Clinical Impression   PTA pt fully independent with BADL/IADL, reports she was not working. At time of eval, pt declines OOB mobility 2/2 pain. She is hesitant to move shoulders other than light circumduction shoulder shrugs. Educated on importance of AROM for progression of BADLs. Will continue to progress for BUE ROM and BADL training. Recommend pt f/u per surgeon's recommendations for OP therapies as needed.    Follow Up Recommendations  No OT follow up;Follow surgeon's recommendation for DC plan and follow-up therapies    Equipment Recommendations  3 in 1 bedside commode    Recommendations for Other Services       Precautions / Restrictions Precautions Precautions: Fall Restrictions Weight Bearing Restrictions: No RUE Weight Bearing: Weight bearing as tolerated LUE Weight Bearing: Weight bearing as tolerated      Mobility Bed Mobility               General bed mobility comments: adamentaly declines OOB  Transfers                      Balance                                           ADL either performed or assessed with clinical judgement   ADL Overall ADL's : Needs assistance/impaired         Upper Body Bathing: Minimal assistance;Sitting       Upper Body Dressing : Minimal assistance;Sitting               Tub/ Shower Transfer: Minimal assistance;Shower seat;Ambulation     General ADL Comments: pt limited by B shoulder pain     Vision Baseline  Vision/History: No visual deficits       Perception     Praxis      Pertinent Vitals/Pain Pain Assessment: Faces Faces Pain Scale: Hurts even more Pain Location: B shoulders Pain Descriptors / Indicators: Guarding;Sore Pain Intervention(s): Limited activity within patient's tolerance;Monitored during session;Premedicated before session     Hand Dominance     Extremity/Trunk Assessment Upper Extremity Assessment Upper Extremity Assessment: RUE deficits/detail;LUE deficits/detail RUE Deficits / Details: shoulder flexion 45 degrees, abduction 30 degrees, full circumduction, WFL elbow wrist and hand  LUE Deficits / Details: shoulder flexion 60 degrees, abduction 45 degrees, full circumduction, WFL elbow wrist and hand            Communication     Cognition Arousal/Alertness: Awake/alert Behavior During Therapy: WFL for tasks assessed/performed Overall Cognitive Status: Within Functional Limits for tasks assessed                                 General Comments: self limiting   General Comments       Exercises Exercises: General Upper Extremity General Exercises - Upper Extremity Shoulder Flexion: AROM;10 reps;Supine Shoulder ABduction: AROM;Both;10 reps;Sidelying Shoulder Horizontal ABduction: AROM;Left;5 reps;Supine Elbow Flexion: AROM;Both;10 reps;Supine Wrist Flexion: AROM;Both;10  reps;Supine Digit Composite Flexion: AROM;Both;10 reps;Supine   Shoulder Instructions Shoulder Instructions ROM for elbow, wrist and digits of operated UE: Independent    Home Living                                          Prior Functioning/Environment                   OT Problem List: Decreased strength;Decreased knowledge of use of DME or AE;Decreased coordination;Decreased range of motion;Impaired UE functional use;Pain      OT Treatment/Interventions: Self-care/ADL training;Therapeutic exercise;Patient/family education;Balance  training;Energy conservation;Therapeutic activities;DME and/or AE instruction    OT Goals(Current goals can be found in the care plan section) Acute Rehab OT Goals Patient Stated Goal: less pain OT Goal Formulation: With patient Time For Goal Achievement: 06/09/19  OT Frequency: Min 3X/week   Barriers to D/C:            Co-evaluation              AM-PAC OT "6 Clicks" Daily Activity     Outcome Measure Help from another person eating meals?: None Help from another person taking care of personal grooming?: A Little Help from another person toileting, which includes using toliet, bedpan, or urinal?: A Little Help from another person bathing (including washing, rinsing, drying)?: A Little Help from another person to put on and taking off regular upper body clothing?: A Little Help from another person to put on and taking off regular lower body clothing?: A Lot 6 Click Score: 18   End of Session Nurse Communication: Mobility status;Precautions  Activity Tolerance: Patient tolerated treatment well Patient left: in bed;with call bell/phone within reach;with family/visitor present  OT Visit Diagnosis: Unsteadiness on feet (R26.81);Muscle weakness (generalized) (M62.81);Pain Pain - Right/Left: Right Pain - part of body: Shoulder                Time: 1202-1220 OT Time Calculation (min): 18 min Charges:  OT General Charges $OT Visit: 1 Visit OT Evaluation $OT Eval Low Complexity: 1 Low OT Treatments $Therapeutic Exercise: 8-22 mins  Zenovia Jarred, MSOT, OTR/L Acute Rehabilitation Services [facility] Office Number: [phone]  Zenovia Jarred 05/26/2019, 5:57 PM

## 2019-05-26 NOTE — Progress Notes (Signed)
OT Cancellation Note  Patient Details Name: Christy Pearson MRN: [id number] DOB: 11-05-1990   Cancelled Treatment:    Reason Eval/Treat Not Completed: Other (comment) Attempted to see pt this AM , requesting OT return after few hours of additional sleep despite encouragement and education. Will check backer later this date.  Dalphine Handing, MSOT, OTR/L Acute Rehabilitation Services [facility] Office Number: [phone]  Dalphine Handing 05/26/2019, 8:31 AM

## 2019-05-26 NOTE — Plan of Care (Signed)
  Problem: Clinical Measurements: Goal: Will remain free from infection Outcome: Progressing Note: Pt has remained free from any signs of infection during my care.    Problem: Pain Managment: Goal: General experience of comfort will improve Outcome: Progressing Note: Pt has ranked her pain 6-8/10 on pain scale during my care. Pt has requested pain medication once.    Problem: Safety: Goal: Ability to remain free from injury will improve Outcome: Progressing Note: Pt has remained free from falls during my care.

## 2019-05-26 NOTE — Progress Notes (Signed)
     Subjective: 1-day post-op bilateral shoulder arthroscopy with irrigation, debridement, extensive debridement of joint and subacromial space with subacromial bursectomy.  Patient laying comfortably in bed. She states she is in moderate pain today. Pain is worse with forward flexion at shoulder. Patient reports "I've slept on both shoulders and it was fine". Reports no pain in left wrist.   Objective:   VITALS:   Vitals:   05/25/19 2305 05/25/19 2315 05/25/19 2328 05/26/19 0624  BP:  102/74 99/71 108/89  Pulse: 88 79 82 62  Resp: 12 15 18 18   Temp:  97.8 F (36.6 C) 97.7 F (36.5 C) 98.1 F (36.7 C)  TempSrc:   Oral Oral  SpO2: 97% 96% 98% 98%  Weight:      Height:       MSK: Right shoulder - operative dressing in place. Patient able to shrug shoulders without pain. Will not perform active range of motion due to pain, patient will not allow me to test passive range of motion.  Left shoulder - operative dressing in place. Patient able to shrug shoulder without pain. Will not perform active range of motion due to pain, patient will not allow me to test passive range of motion.  Left wrist - full ROM of left wrist. No pain to palpation. No edema noted.    Lab Results  Component Value Date   WBC 7.9 05/24/2019   HGB 8.9 (L) 05/24/2019   HCT 28.3 (L) 05/24/2019   MCV 79.5 (L) 05/24/2019   PLT 410 (H) 05/24/2019   BMET    Component Value Date/Time   NA 143 05/24/2019 0224   K 4.3 05/24/2019 0224   CL 112 (H) 05/24/2019 0224   CO2 23 05/24/2019 0224   GLUCOSE 89 05/24/2019 0224   BUN 7 05/24/2019 0224   CREATININE 0.53 05/24/2019 0224   CALCIUM 8.1 (L) 05/24/2019 0224   GFRNONAA >60 05/24/2019 0224   GFRAA >60 05/24/2019 0224     Assessment/Plan: 1 Day Post-Op   Principal Problem:   Septic arthritis of shoulder, left (HCC) Active Problems:   Injection of illicit drug within last 12 months   Hepatitis C antibody positive, s/p spontaneously cleared infection  Sepsis (HCC)   Septic pulmonary embolism (HCC)   Substance use disorder   Suspected endocarditis   MRSA bacteremia   IV drug abuse (HCC)   Septic embolism (HCC)   Staphylococcal arthritis of right shoulder (HCC)   Staphylococcal arthritis of left wrist (HCC)   PFO (patent foramen ovale)   Endocarditis of tricuspid valve   Septic arthritis of shoulder, right (HCC)  Bilateral shoulder septic arthritis:  - Post-op Day 1 bilateral shoulder arthroscopy with irrigation, debridement, extensive debridement of joint and subacromial space with subacromial bursectom - Antibiotics per infectious disease - will continue to watch operative culture results, though patient was on antibiotics prior to cultures taken  - will continue to assess patient's bilateral active ROM  -WBAT bilateral upper extremities, does not need to use slings   07/22/2019 05/26/2019, 8:21 AM   07/24/2019, MD Cell [phone]

## 2019-05-26 NOTE — Progress Notes (Signed)
Pharmacy Antibiotic Note  Christy Pearson is a 29 y.o. female admitted on 05/18/2019 with MRSA bacteremia and TV endocarditis. Blood culture on 1/28 was positive for MRSA and repeat blood cultures on 1/29 are no growth to date. TEE found one 0.5cm vegetations on at least 2 leaflets of TV.  Pharmacy has been consulted for vancomycin dosing for endocarditis. Patient has been receiving vancomycin 1000mg  IV q12h. Scr is stable. WBC wnl. Afebrile.   Vancomycin levels obtained on 2/2. Vancomycin trough was 9 and vancomycin peak was 29. Calculated AUC is 442 which is in goal of 400-550.  Of note, MRI of right shoulder found moderate to large glenohumeral joint effusion with synovitis and findings suspicious for septic arthritis in setting of known systemic infection. MRI of left shoulder found small joint effusion suspicious for septic arthritis and concerning of osteomyelitis. MRI of left wrist found midcarpal joint effusion concerning for possible septic arthritis. Bilateral should arthroscopy was performed on 2/4. Left and right shoulder fluid cultures found no organisms on gram stain, culture pending.  Plan: Continue vancomycin 1000mg  IV q12h  Obtain vancomycin levels as appropriate  Monitor renal function, cultures/sensitivites, and clinical progression  Follow up CVTS recommendations   Height: 5' 7.01" (170.2 cm) Weight: 124 lb 9 oz (56.5 kg) IBW/kg (Calculated) : 61.62  Temp (24hrs), Avg:97.8 F (36.6 C), Min:97.7 F (36.5 C), Max:98.1 F (36.7 C)  Recent Labs  Lab 05/20/19 0258 05/20/19 0258 05/21/19 0049 05/21/19 0902 05/21/19 1035 05/22/19 0406 05/23/19 0536 05/23/19 1006 05/23/19 1412 05/24/19 0224  WBC 2.5*   < > 8.0  --  10.5 11.5* 9.2  --   --  7.9  CREATININE 0.61  --  0.62  --   --  0.65 0.55  --   --  0.53  VANCOTROUGH  --   --   --  9*  --   --   --  9*  --   --   VANCOPEAK  --   --  21*  --   --   --   --   --  29*  --    < > = values in this interval not displayed.     Estimated Creatinine Clearance: 95 mL/min (by C-G formula based on SCr of 0.53 mg/dL).    Allergies  Allergen Reactions  . Tylenol [Acetaminophen] Rash    Pt just reported tylenol allergy at this visit at 2338pm    Antimicrobials this admission: Vancomycin 1/29 >>  CTX 1/30 >> 2/1 Cefepime 1/29 x3 doses  Dose adjustments this admission: 1/31: VP 29, VT 9, AUC 366 - increased dose to 1000 mg Q12 hrs  Microbiology results: 1/28 BCx: MRSA 1/28 UCx: Ecoli (UA contaminated)  1/29 resp panel: neg 1/29 BCx: ngtd 2/4 R shoulder fluid: no organisms 2/4 L shoulder fluid: no organisms  2/29, PharmD PGY1 Pharmacy Resident [employer]: [phone]  05/26/2019 9:36 AM

## 2019-05-26 NOTE — Progress Notes (Signed)
Subjective: No new complaints   Antibiotics:  Anti-infectives (From admission, onward)   Start     Dose/Rate Route Frequency Ordered Stop   05/25/19 1230  amoxicillin (AMOXIL) capsule 500 mg     500 mg Oral  Once 05/25/19 1120 05/25/19 1435   05/21/19 1000  vancomycin (VANCOCIN) IVPB 1000 mg/200 mL premix     1,000 mg 200 mL/hr over 60 Minutes Intravenous Every 12 hours 05/21/19 0948     05/20/19 1400  cefTRIAXone (ROCEPHIN) 2 g in sodium chloride 0.9 % 100 mL IVPB  Status:  Discontinued     2 g 200 mL/hr over 30 Minutes Intravenous Every 24 hours 05/20/19 1328 05/22/19 0947   05/19/19 2200  levofloxacin (LEVAQUIN) IVPB 750 mg  Status:  Discontinued     750 mg 100 mL/hr over 90 Minutes Intravenous Every 24 hours 05/19/19 0212 05/19/19 0213   05/19/19 2200  levofloxacin (LEVAQUIN) IVPB 500 mg  Status:  Discontinued     500 mg 100 mL/hr over 60 Minutes Intravenous Every 24 hours 05/19/19 0213 05/19/19 0539   05/19/19 1000  vancomycin (VANCOREADY) IVPB 750 mg/150 mL  Status:  Discontinued     750 mg 150 mL/hr over 60 Minutes Intravenous Every 12 hours 05/19/19 0539 05/21/19 0948   05/19/19 0600  ceFEPIme (MAXIPIME) 2 g in sodium chloride 0.9 % 100 mL IVPB  Status:  Discontinued     2 g 200 mL/hr over 30 Minutes Intravenous Every 8 hours 05/19/19 0539 05/19/19 2348   05/19/19 0130  vancomycin (VANCOCIN) IVPB 1000 mg/200 mL premix     1,000 mg 200 mL/hr over 60 Minutes Intravenous  Once 05/19/19 0122 05/19/19 0253   05/18/19 2345  levofloxacin (LEVAQUIN) IVPB 750 mg     750 mg 100 mL/hr over 90 Minutes Intravenous  Once 05/18/19 2338 05/19/19 0140      Medications: Scheduled Meds: . enoxaparin (LOVENOX) injection  40 mg Subcutaneous Q24H  . ferrous sulfate  325 mg Oral BID  . HYDROmorphone      . HYDROmorphone      . methadone  15 mg Oral Daily  . prenatal multivitamin  1 tablet Oral Daily   Continuous Infusions: . lactated ringers 10 mL/hr at 05/24/19 0902  .  vancomycin 1,000 mg (05/25/19 0924)   PRN Meds:.ketorolac, ondansetron **OR** ondansetron (ZOFRAN) IV    Objective: Weight change: 0 kg  Intake/Output Summary (Last 24 hours) at 05/26/2019 1016 Last data filed at 05/26/2019 7096 Gross per 24 hour  Intake 240 ml  Output 1950 ml  Net -1710 ml   Blood pressure 108/89, pulse 62, temperature 98.1 F (36.7 C), temperature source Oral, resp. rate 18, height 5' 7.01" (1.702 m), weight 56.5 kg, last menstrual period 04/27/2019, SpO2 98 %, unknown if currently breastfeeding. Temp:  [97.7 F (36.5 C)-98.1 F (36.7 C)] 98.1 F (36.7 C) (02/05 0624) Pulse Rate:  [62-88] 62 (02/05 0624) Resp:  [11-19] 18 (02/05 0624) BP: (96-108)/(68-89) 108/89 (02/05 0624) SpO2:  [93 %-98 %] 98 % (02/05 0624) Weight:  [56.5 kg] 56.5 kg (02/04 1728)  Physical Exam: General: Alert and awake, oriented x3, not in any acute distress. HEENT: anicteric sclera, EOMI CVS regular rate, normal  Chest: , no wheezing, no respiratory distress Abdomen: soft non-distended,  Extremities: bilaterally dresssed shoulders less tenderness in left wrist Skin: no rashes Neuro: nonfocal  CBC:    BMET Recent Labs    05/24/19 0224  NA 143  K  4.3  CL 112*  CO2 23  GLUCOSE 89  BUN 7  CREATININE 0.53  CALCIUM 8.1*     Liver Panel  Recent Labs    05/24/19 0224  PROT 6.0*  ALBUMIN 1.4*  AST 161*  ALT 141*  ALKPHOS 173*  BILITOT 0.5       Sedimentation Rate No results for input(s): ESRSEDRATE in the last 72 hours. C-Reactive Protein No results for input(s): CRP in the last 72 hours.  Micro Results: Recent Results (from the past 720 hour(s))  Blood Culture (routine x 2)     Status: Abnormal   Collection Time: 05/18/19 10:52 PM   Specimen: BLOOD  Result Value Ref Range Status   Specimen Description   Final    BLOOD BLOOD LEFT ARM Performed at [facility], [address] [place], [place] [address]    Special Requests   Final     BOTTLES DRAWN AEROBIC AND ANAEROBIC Blood Culture adequate volume Performed at [facility], [address] [place], [place] [address]    Culture  Setup Time   Final    IN BOTH AEROBIC AND ANAEROBIC BOTTLES GRAM POSITIVE COCCI CRITICAL RESULT CALLED TO, READ BACK BY AND VERIFIED WITH: Evelena Peat Dr [facility] 05/19/19 2254 JDW Performed at [facility], [address] [place], [place] [address]    Culture METHICILLIN RESISTANT STAPHYLOCOCCUS AUREUS (A)  Final   Report Status 05/21/2019 FINAL  Final   Organism ID, Bacteria METHICILLIN RESISTANT STAPHYLOCOCCUS AUREUS  Final      Susceptibility   Methicillin resistant staphylococcus aureus - MIC*    CIPROFLOXACIN <=0.5 SENSITIVE Sensitive     ERYTHROMYCIN >=8 RESISTANT Resistant     GENTAMICIN <=0.5 SENSITIVE Sensitive     OXACILLIN >=4 RESISTANT Resistant     TETRACYCLINE <=1 SENSITIVE Sensitive     VANCOMYCIN 1 SENSITIVE Sensitive     TRIMETH/SULFA <=10 SENSITIVE Sensitive     CLINDAMYCIN >=8 RESISTANT Resistant     RIFAMPIN <=0.5 SENSITIVE Sensitive     Inducible Clindamycin NEGATIVE Sensitive     * METHICILLIN RESISTANT STAPHYLOCOCCUS AUREUS  Blood Culture ID Panel (Reflexed)     Status: Abnormal   Collection Time: 05/18/19 10:52 PM  Result Value Ref Range Status   Enterococcus species NOT DETECTED NOT DETECTED Final   Listeria monocytogenes NOT DETECTED NOT DETECTED Final   Staphylococcus species DETECTED (A) NOT DETECTED Final    Comment: CRITICAL RESULT CALLED TO, READ BACK BY AND VERIFIED WITH: G ABBOTT PHARMD 05/19/19 2254 JDW    Staphylococcus aureus (BCID) DETECTED (A) NOT DETECTED Final    Comment: Methicillin (oxacillin)-resistant Staphylococcus aureus (MRSA). MRSA is predictably resistant to beta-lactam antibiotics (except ceftaroline). Preferred therapy is vancomycin unless clinically contraindicated. Patient requires contact precautions if  hospitalized. CRITICAL RESULT CALLED TO, READ BACK BY AND VERIFIED  WITH: G ABBOTT PHARMD 05/19/19 2254 JDW    Methicillin resistance DETECTED (A) NOT DETECTED Final    Comment: CRITICAL RESULT CALLED TO, READ BACK BY AND VERIFIED WITH: G ABBOTT PHARMD 05/19/19 2254 JDW    Streptococcus species NOT DETECTED NOT DETECTED Final   Streptococcus agalactiae NOT DETECTED NOT DETECTED Final   Streptococcus pneumoniae NOT DETECTED NOT DETECTED Final   Streptococcus pyogenes NOT DETECTED NOT DETECTED Final   Acinetobacter baumannii NOT DETECTED NOT DETECTED Final   Enterobacteriaceae species NOT DETECTED NOT DETECTED Final   Enterobacter cloacae complex NOT DETECTED NOT DETECTED Final   Escherichia coli NOT DETECTED NOT  DETECTED Final   Klebsiella oxytoca NOT DETECTED NOT DETECTED Final   Klebsiella pneumoniae NOT DETECTED NOT DETECTED Final   Proteus species NOT DETECTED NOT DETECTED Final   Serratia marcescens NOT DETECTED NOT DETECTED Final   Haemophilus influenzae NOT DETECTED NOT DETECTED Final   Neisseria meningitidis NOT DETECTED NOT DETECTED Final   Pseudomonas aeruginosa NOT DETECTED NOT DETECTED Final   Candida albicans NOT DETECTED NOT DETECTED Final   Candida glabrata NOT DETECTED NOT DETECTED Final   Candida krusei NOT DETECTED NOT DETECTED Final   Candida parapsilosis NOT DETECTED NOT DETECTED Final   Candida tropicalis NOT DETECTED NOT DETECTED Final    Comment: Performed at [facility], [place] [address] [place], [place] [address]  Urine culture     Status: Abnormal   Collection Time: 05/18/19 11:38 PM   Specimen: In/Out Cath Urine  Result Value Ref Range Status   Specimen Description   Final    IN/OUT CATH URINE Performed at [facility], [address] [place], [place] [address]    Special Requests   Final    NONE Performed at [facility], [address] [place], [place] [address]    Culture >=100,000 COLONIES/mL ESCHERICHIA COLI (A)  Final   Report Status 05/21/2019 FINAL  Final   Organism ID, Bacteria  ESCHERICHIA COLI (A)  Final      Susceptibility   Escherichia coli - MIC*    AMPICILLIN >=32 RESISTANT Resistant     CEFAZOLIN >=64 RESISTANT Resistant     CEFTRIAXONE 0.5 SENSITIVE Sensitive     CIPROFLOXACIN <=0.25 SENSITIVE Sensitive     GENTAMICIN <=1 SENSITIVE Sensitive     IMIPENEM <=0.25 SENSITIVE Sensitive     NITROFURANTOIN <=16 SENSITIVE Sensitive     TRIMETH/SULFA <=20 SENSITIVE Sensitive     AMPICILLIN/SULBACTAM >=32 RESISTANT Resistant     PIP/TAZO 8 SENSITIVE Sensitive     * >=100,000 COLONIES/mL ESCHERICHIA COLI  SARS CORONAVIRUS 2 (TAT 6-24 HRS) Nasopharyngeal In/Out Cath Urine     Status: None   Collection Time: 05/18/19 11:39 PM   Specimen: In/Out Cath Urine; Nasopharyngeal  Result Value Ref Range Status   SARS Coronavirus 2 NEGATIVE NEGATIVE Final    Comment: (NOTE) SARS-CoV-2 target nucleic acids are NOT DETECTED. The SARS-CoV-2 RNA is generally detectable in upper and lower respiratory specimens during the acute phase of infection. Negative results do not preclude SARS-CoV-2 infection, do not rule out co-infections with other pathogens, and should not be used as the sole basis for treatment or other patient management decisions. Negative results must be combined with clinical observations, patient history, and epidemiological information. The expected result is Negative. Fact Sheet for Patients: SugarRoll.be Fact Sheet for Healthcare Providers: https://www.woods-mathews.com/ This test is not yet approved or cleared by the [place] FDA and  has been authorized for detection and/or diagnosis of SARS-CoV-2 by FDA under an Emergency Use Authorization (EUA). This EUA will remain  in effect (meaning this test can be used) for the duration of the COVID-19 declaration under Section 56 4(b)(1) of the Act, 21 U.S.C. section 360bbb-3(b)(1), unless the authorization is terminated or revoked sooner. Performed at [facility], [place] [address], [place], [place] [address]   SARS Coronavirus 2 Ag (30 min TAT) - In/Out Cath Urine     Status: None   Collection Time: 05/18/19 11:40 PM   Specimen: In/Out Cath Urine; Nasal Swab  Result Value Ref Range Status  SARS Coronavirus 2 Ag NEGATIVE NEGATIVE Final    Comment: (NOTE) SARS-CoV-2 antigen NOT DETECTED.  Negative results are presumptive.  Negative results do not preclude SARS-CoV-2 infection and should not be used as the sole basis for treatment or other patient management decisions, including infection  control decisions, particularly in the presence of clinical signs and  symptoms consistent with COVID-19, or in those who have been in contact with the virus.  Negative results must be combined with clinical observations, patient history, and epidemiological information. The expected result is Negative. Fact Sheet for Patients: https://sanders-williams.net/ Fact Sheet for Healthcare Providers: https://martinez.com/ This test is not yet approved or cleared by the [place] FDA and  has been authorized for detection and/or diagnosis of SARS-CoV-2 by FDA under an Emergency Use Authorization (EUA).  This EUA will remain in effect (meaning this test can be used) for the duration of  the COVID-19 de claration under Section 564(b)(1) of the Act, 21 U.S.C. section 360bbb-3(b)(1), unless the authorization is terminated or revoked sooner. Performed at [facility], [address] [place], [place] [address]   Blood Culture (routine x 2)     Status: Abnormal   Collection Time: 05/18/19 11:50 PM   Specimen: BLOOD  Result Value Ref Range Status   Specimen Description   Final    BLOOD BLOOD RIGHT ARM Performed at [facility], [address] [place], [place] [address]    Special Requests   Final    BOTTLES DRAWN AEROBIC AND ANAEROBIC Blood Culture adequate volume Performed at [facility],  [address] [place], [place] [address]    Culture  Setup Time   Final    IN BOTH AEROBIC AND ANAEROBIC BOTTLES GRAM POSITIVE COCCI CRITICAL RESULT CALLED TO, READ BACK BY AND VERIFIED WITH: G ABBOTT PHARMD 05/19/19 2254 JDW    Culture (A)  Final    STAPHYLOCOCCUS AUREUS SUSCEPTIBILITIES PERFORMED ON PREVIOUS CULTURE WITHIN THE LAST 5 DAYS. Performed at [facility], [address], [place], [place] [address]    Report Status 05/21/2019 FINAL  Final  Culture, blood (single) w Reflex to ID Panel     Status: None   Collection Time: 05/19/19  1:40 AM   Specimen: BLOOD LEFT ARM  Result Value Ref Range Status   Specimen Description   Final    BLOOD LEFT ARM Performed at [facility], [address] [place], [place] [address]    Special Requests   Final    BOTTLES DRAWN AEROBIC AND ANAEROBIC Blood Culture adequate volume Performed at [facility], [address], [place], [place] [address]    Culture   Final    NO GROWTH 5 DAYS Performed at [facility], [address] [place], [place] [address]    Report Status 05/24/2019 FINAL  Final  Respiratory Panel by PCR     Status: None   Collection Time: 05/19/19  5:58 AM   Specimen: Nasopharyngeal Swab; Respiratory  Result Value Ref Range Status   Adenovirus NOT DETECTED NOT DETECTED Final   Coronavirus 229E NOT DETECTED NOT DETECTED Final    Comment: (NOTE) The Coronavirus on the Respiratory Panel, DOES NOT test for the novel  Coronavirus (2019 nCoV)    Coronavirus HKU1 NOT DETECTED NOT DETECTED Final   Coronavirus NL63 NOT DETECTED NOT DETECTED Final   Coronavirus OC43 NOT DETECTED NOT DETECTED Final   Metapneumovirus NOT DETECTED NOT  DETECTED Final   Rhinovirus / Enterovirus NOT DETECTED NOT DETECTED Final   Influenza A NOT DETECTED NOT DETECTED Final   Influenza B NOT DETECTED NOT DETECTED Final   Parainfluenza Virus 1 NOT DETECTED NOT DETECTED Final   Parainfluenza Virus 2 NOT DETECTED  NOT DETECTED Final   Parainfluenza Virus 3 NOT DETECTED NOT DETECTED Final   Parainfluenza Virus 4 NOT DETECTED NOT DETECTED Final   Respiratory Syncytial Virus NOT DETECTED NOT DETECTED Final   Bordetella pertussis NOT DETECTED NOT DETECTED Final   Chlamydophila pneumoniae NOT DETECTED NOT DETECTED Final   Mycoplasma pneumoniae NOT DETECTED NOT DETECTED Final    Comment: Performed at [facility], [address] [place], [place] [address]  Culture, blood (routine x 2)     Status: None   Collection Time: 05/19/19  7:15 AM   Specimen: BLOOD  Result Value Ref Range Status   Specimen Description BLOOD LEFT ANTECUBITAL  Final   Special Requests   Final    BOTTLES DRAWN AEROBIC ONLY Blood Culture results may not be optimal due to an inadequate volume of blood received in culture bottles   Culture   Final    NO GROWTH 5 DAYS Performed at [facility], [address], [place], [place] [address]    Report Status 05/24/2019 FINAL  Final  Culture, blood (routine x 2)     Status: None   Collection Time: 05/19/19  7:20 AM   Specimen: BLOOD RIGHT HAND  Result Value Ref Range Status   Specimen Description BLOOD RIGHT HAND  Final   Special Requests   Final    BOTTLES DRAWN AEROBIC ONLY Blood Culture results may not be optimal due to an inadequate volume of blood received in culture bottles   Culture   Final    NO GROWTH 5 DAYS Performed at [facility], [address], [place], [place] [address]    Report Status 05/24/2019 FINAL  Final  Surgical pcr screen     Status: None   Collection Time: 05/25/19  2:07 PM   Specimen: Nasal Mucosa; Nasal Swab  Result Value Ref Range Status   MRSA, PCR NEGATIVE NEGATIVE Final   Staphylococcus aureus NEGATIVE NEGATIVE Final    Comment: (NOTE) The Xpert SA Assay (FDA approved for NASAL specimens in patients 75 years of age and older), is one component of a comprehensive surveillance program. It is not intended to diagnose infection nor  to guide or monitor treatment. Performed at [facility], [address], [place], [place] [address]   Aerobic/Anaerobic Culture (surgical/deep wound)     Status: None (Preliminary result)   Collection Time: 05/25/19 10:08 PM   Specimen: Other Source; Body Fluid  Result Value Ref Range Status   Specimen Description FLUID LEFT SHOULDER  Final   Special Requests PATIENT ON FOLLOWING VANC  Final   Gram Stain   Final    NO WBC SEEN NO ORGANISMS SEEN Performed at [facility], [address] [place], [place] [address]    Culture PENDING  Incomplete   Report Status PENDING  Incomplete  Aerobic/Anaerobic Culture (surgical/deep wound)     Status: None (Preliminary result)   Collection Time: 05/25/19 10:08 PM   Specimen: Other Source; Body Fluid  Result Value Ref Range Status   Specimen Description FLUID LEFT SHOULDER SUBACROMIAL  Final   Special Requests PATIENT ON FOLLOWING VANC  Final   Gram Stain   Final  RARE WBC PRESENT, PREDOMINANTLY MONONUCLEAR NO ORGANISMS SEEN Performed at [facility], [address] [place], [place] [address]    Culture PENDING  Incomplete   Report Status PENDING  Incomplete  Aerobic/Anaerobic Culture (surgical/deep wound)     Status: None (Preliminary result)   Collection Time: 05/25/19 10:08 PM   Specimen: Other Source; Body Fluid  Result Value Ref Range Status   Specimen Description FLUID RIGHT SHOULDER  Final   Special Requests PATIENT ON FOLLOWING VANC  Final   Gram Stain   Final    ABUNDANT WBC PRESENT, PREDOMINANTLY PMN NO ORGANISMS SEEN Performed at [facility], [address] [place], [place] [address]    Culture PENDING  Incomplete   Report Status PENDING  Incomplete  Aerobic/Anaerobic Culture (surgical/deep wound)     Status: None (Preliminary result)   Collection Time: 05/25/19 10:27 PM   Specimen: Other Source; Body Fluid  Result Value Ref Range Status   Specimen Description FLUID RIGHT SHOULDER SUBACROMIAL   Final   Special Requests PATIENT ON FOLLOWING VANC  Final   Gram Stain   Final    RARE WBC PRESENT,BOTH PMN AND MONONUCLEAR NO ORGANISMS SEEN Performed at [facility], [address] [place], [place] [address]    Culture PENDING  Incomplete   Report Status PENDING  Incomplete    Studies/Results: MR Lumbar Spine W Wo Contrast  Result Date: 05/24/2019 CLINICAL DATA:  Back pain, history of IV drug use EXAM: MRI LUMBAR SPINE WITHOUT AND WITH CONTRAST TECHNIQUE: Multiplanar and multiecho pulse sequences of the lumbar spine were obtained without and with intravenous contrast. CONTRAST:  5.485mL GADAVIST GADOBUTROL 1 MMOL/ML IV SOLN COMPARISON:  None. FINDINGS: Motion artifact is present particularly on axial imaging. Segmentation:  Standard. Alignment: Straightening of the lumbar lordosis. No significant anteroposterior listhesis. Vertebrae: Vertebral body heights are preserved. There is diffusely abnormal T1 marrow signal. Mild marrow edema is present at the opposing L5-S1 endplates. Conus medullaris and cauda equina: Conus extends to the L1 level. Conus and cauda equina appear normal. Paraspinal and other soft tissues: Mild presacral edema. Disc levels: Mild disc desiccation at L4-L5 with small central protrusion and annular fissure. No stenosis. Mild disc desiccation and height loss at L5-S1. Superimposed mild disc bulge with endplate osteophytic ridging. There is enhancement of the disc posteriorly. There is ventral epidural enhancing T2 hyperintense soft tissue extending inferiorly from the disc to the lower S1 level with resulting mild effacement of the thecal sac. This abuts the traversing S1 nerve roots. Mild to moderate foraminal stenosis. IMPRESSION: Enhancing ventral epidural soft tissue at the L5-S1 disc level extending to the lower S1 level favored to reflect phlegmon. There is partial effacement of the thecal sac and potential compression of the traversing S1 nerve roots. Enhancement of  the posterior L5-S1 disc and mild marrow edema at L5-S1 endplates is presumed to reflect discitis/osteomyelitis given above. Diffusely abnormal T1 marrow signal, which may reflect hematopoietic marrow in the setting of anemia with other marrow infiltrative process not excluded. Electronically Signed   By: Guadlupe SpanishPraneil  Patel M.D.   On: 05/24/2019 16:42   MR WRIST LEFT W WO CONTRAST  Result Date: 05/25/2019 CLINICAL DATA:  Left wrist pain. Sepsis. Concern for septic arthritis EXAM: MR OF THE LEFT WRIST WITHOUT AND WITH CONTRAST TECHNIQUE: Multiplanar multisequence MR imaging of the left wrist was performed both before and after the administration of intravenous contrast. CONTRAST:  5.355mL GADAVIST GADOBUTROL 1 MMOL/ML IV SOLN COMPARISON:  None. FINDINGS: Technical note: Examination is mildly degraded by motion artifact. Ligaments: Scapholunate ligament is grossly intact. Triangular fibrocartilage: Heterogeneity of the articular disc near the radial attachment (series 15, image 11) with a small amount of fluid in the distal radioulnar joint raising the suspicion for a small TFC perforation. Tendons: Mild tendinosis and tenosynovitis of the extensor carpi ulnaris tendon at the level of the ulnar styloid (series 21, image 19). Extensor tendons appear otherwise intact. Flexor tendons appear intact. Carpal tunnel/median nerve: Unremarkable. Guyon's canal: Unremarkable. Joint: Midcarpal joint effusion with synovitis and enhancement. No joint fluid within the proximal carpal row. Bones/carpal alignment: No acute fracture. There is preservation of the T1 fatty marrow signal without evidence of osteomyelitis. Incidental note of lunotriquetral coalition. Osseous alignment is maintained without dislocation. Other: Mild soft tissue edema without a well-defined or rim enhancing fluid collection. IMPRESSION: 1. Midcarpal joint effusion with synovitis and enhancement. Findings can be seen in the setting of inflammatory or infectious  arthropathy. Arthrocentesis is recommended to evaluate for septic arthritis. 2. No evidence of osteomyelitis. 3. Mild tendinosis and tenosynovitis of the ECU tendon at the level of the ulnar styloid. 4. Heterogeneity of the articular disc near the radial attachment with a small amount of fluid in the distal radioulnar joint raising the suspicion for a small TFC perforation. 5. Lunotriquetral coalition incidentally noted. These results will be called to the ordering clinician or representative by the Radiologist Assistant, and communication documented in the PACS or zVision Dashboard. Electronically Signed   By: Duanne Guess D.O.   On: 05/25/2019 11:25   MR SHOULDER LEFT W WO CONTRAST  Result Date: 05/25/2019 CLINICAL DATA:  Left shoulder pain, sepsis. Concern for septic arthritis EXAM: MRI OF THE LEFT SHOULDER WITHOUT AND WITH CONTRAST TECHNIQUE: Multiplanar, multisequence MR imaging of the left shoulder was performed before and after the administration of intravenous contrast. CONTRAST:  5.38mL GADAVIST GADOBUTROL 1 MMOL/ML IV SOLN COMPARISON:  None. FINDINGS: Technical note: Examination is mildly degraded by motion artifact. Rotator cuff: Heterogeneously increased signal in the distal supraspinatus and infraspinatus tendons with probable articular sided tear of the infraspinatus tendon (series 3, image 10) and questionable bursal sided tearing of the anterior supraspinatus tendon (series 3, image 14) no full-thickness or retracted tear. Mildly thickened heterogeneous appearance of the subscapularis tendon which may be reactive or secondary to tendinosis. Teres minor intact. Muscles: Intramuscular edema throughout the rotator cuff musculature, most pronounced within the infraspinatus muscle belly. Biceps long head:  Intact. Acromioclavicular Joint: Normal acromioclavicular joint. Moderate volume subacromial-subdeltoid bursitis. Glenohumeral Joint: Small volume glenohumeral joint effusion. No discrete chondral  defect. Labrum:  Grossly intact, poorly evaluated. Bones: Prominent patchy isointense T1 marrow signal within the proximal humeral metaphysis suggestive of prominent red marrow. No associated cortical destruction or bone marrow edema to suggest osteomyelitis. Mild reactive subcortical marrow signal changes at the posterior aspect of the greater tuberosity. Other: Extensive surrounding soft tissue edema and pericapsular enhancement. There is edema within the visualized chest wall and supraclavicular regions. Partially visualized patchy signal within the left lung, suggestive of multifocal pneumonia as seen on recent chest x-ray. IMPRESSION: 1. Small glenohumeral joint effusion with prominent pericapsular soft tissue enhancement surrounding the left shoulder, suspicious for septic arthritis in the setting of known systemic infection. Arthrocentesis is recommended. 2. Moderate subacromial-subdeltoid bursitis. 3. Heterogeneously increased signal within the distal supraspinatus and infraspinatus tendons with probable articular sided tear of the infraspinatus tendon and questionable bursal sided tearing of the anterior supraspinatus tendon. No full-thickness  rotator cuff tendon tear. 4. Intramuscular edema throughout the rotator cuff musculature, most pronounced within the infraspinatus muscle belly. Findings are nonspecific but may reflect myositis. 5. Prominent red marrow within the proximal humeral metaphysis without associated cortical destruction or bone marrow edema to suggest osteomyelitis. These results will be called to the ordering clinician or representative by the Radiologist Assistant, and communication documented in the PACS or zVision Dashboard. Electronically Signed   By: Duanne GuessNicholas  Plundo D.O.   On: 05/25/2019 11:12      Assessment/Plan:  INTERVAL HISTORY: Status post bilateral shoulder arthroscopic surgeries by Dr. Dion SaucierLandau   Principal Problem:   Septic arthritis of shoulder, left (HCC) Active  Problems:   Injection of illicit drug within last 12 months   Hepatitis C antibody positive, s/p spontaneously cleared infection   Sepsis (HCC)   Septic pulmonary embolism (HCC)   Substance use disorder   Suspected endocarditis   MRSA bacteremia   IV drug abuse (HCC)   Septic embolism (HCC)   Staphylococcal arthritis of right shoulder (HCC)   Staphylococcal arthritis of left wrist (HCC)   PFO (patent foramen ovale)   Endocarditis of tricuspid valve   Septic arthritis of shoulder, right (HCC)    Christy BougieSavannah Pearson is a 29 y.o. 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 Dion SaucierLandau, posssible septic wrist, discitis and osteomyelitis from L5-S1 with ventral epidural enhancement concerning for phlegmon with partial effacement of the thecal sac.  #1 Metastatic MRSA infection  --continue vancomycin  --repeat blood cultures are NG FINAL --she is sp I and D of bilateral shoulders --she needs to be seen by Neurosurgery --" hand surgery --Need to stay in the hospital to complete effective treatment.  #2  Bilateral septic shoulders: Status post surgery by orthopedics  #3 possible septic left wrist: She has less pain there compared to the other day but the MRI findings are botherseome, I think it would be prudent to have orthopedic hand surgery see her  #4  L5-S1 discitis with ventral epidural enhancement and possible phlegmon:  He has no neurological deficits but I think neurosurgery should see her at least be aware of her.  She needs close neurological monitoring and likely will need to repeat imaging which hopefully will not require general anesthesia the next time.  #5 IV drug use: We will need to plan for treatment as an outpatient  Dr. Orvan Falconerampbell is available for questions this weekend and will follow up on cultures from her surgery.    LOS: 7 days    Acey LavCornelius Van Dam 05/26/2019, 10:16 AM

## 2019-05-26 NOTE — Progress Notes (Signed)
Occupational Therapy Treatment Patient Details Name: Christy Pearson MRN: [id number] DOB: 04/14/1991 Today's Date: 05/26/2019    History of present illness 29 yo female admitted with fevere chills and back pain. 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 PMH hepatitis C, Kidney stones, IV drug abuse,    OT comments  Grandmother present and encouraged patient participation. Pt states "my left doesn't hurt only my Right one" pt with decrease ROM of R UE for shoulder flexion and abduction. Pt instructed on shoulder flexion, abduction, circumduction and scapula retraction 10 - 15 reps x3 per day minimum. Pt declined OOB but educated next session would be OOB to help with basic transfers. Pt states "okay". Pt understands prolonged hospital stay for medications. Grandmother asking for copies of blood work. RN made aware. Pt agreeable to grandmother having access to medical records.    Follow Up Recommendations  No OT follow up    Equipment Recommendations  3 in 1 bedside commode    Recommendations for Other Services      Precautions / Restrictions Precautions Precautions: Fall Restrictions RUE Weight Bearing: Weight bearing as tolerated LUE Weight Bearing: Weight bearing as tolerated       Mobility Bed Mobility               General bed mobility comments: declined OOB stating "my back is hurt"   Transfers                      Balance                                           ADL either performed or assessed with clinical judgement   ADL                                               Vision       Perception     Praxis      Cognition Arousal/Alertness: Awake/alert Behavior During Therapy: WFL for tasks assessed/performed Overall Cognitive Status: Within Functional  Limits for tasks assessed                                          Exercises Exercises: General Upper Extremity General Exercises - Upper Extremity Shoulder Flexion: AROM;10 reps;Supine Shoulder ABduction: AROM;Both;10 reps;Sidelying Shoulder Horizontal ABduction: AROM;Left;5 reps;Supine Elbow Flexion: AROM;Both;10 reps;Supine Wrist Flexion: AROM;Both;10 reps;Supine Digit Composite Flexion: AROM;Both;10 reps;Supine   Shoulder Instructions Shoulder Instructions ROM for elbow, wrist and digits of operated UE: Independent     General Comments      Pertinent Vitals/ Pain       Pain Assessment: Faces Faces Pain Scale: Hurts a little bit Pain Location: R shoulder Pain Descriptors / Indicators: Guarding;Sore Pain Intervention(s): Premedicated before session;Repositioned  Home Living                                          Prior Functioning/Environment  Frequency  Min 3X/week        Progress Toward Goals  OT Goals(current goals can now be found in the care plan section)  Progress towards OT goals: Progressing toward goals     Plan Discharge plan remains appropriate    Co-evaluation                 AM-PAC OT "6 Clicks" Daily Activity     Outcome Measure   Help from another person eating meals?: None Help from another person taking care of personal grooming?: A Little Help from another person toileting, which includes using toliet, bedpan, or urinal?: A Little Help from another person bathing (including washing, rinsing, drying)?: A Little Help from another person to put on and taking off regular upper body clothing?: A Little Help from another person to put on and taking off regular lower body clothing?: A Lot 6 Click Score: 18    End of Session    OT Visit Diagnosis: Unsteadiness on feet (R26.81);Muscle weakness (generalized) (M62.81)   Activity Tolerance Patient tolerated treatment well   Patient  Left in bed;with call bell/phone within reach;with family/visitor present   Nurse Communication Mobility status;Precautions        Time: 3570-1779 OT Time Calculation (min): 12 min  Charges: OT General Charges $OT Visit: 1 Visit OT Treatments $Therapeutic Exercise: 8-22 mins   Brynn, OTR/L  Acute Rehabilitation Services Pager: [phone] Office: [phone] .    Mateo Flow 05/26/2019, 4:13 PM

## 2019-05-26 NOTE — Plan of Care (Signed)
  Problem: Education: Goal: Knowledge of General Education information will improve Description Including pain rating scale, medication(s)/side effects and non-pharmacologic comfort measures Outcome: Progressing   

## 2019-05-26 NOTE — Progress Notes (Signed)
PROGRESS NOTE  Christy Pearson IRJ:[id number] DOB: 1990-08-23 DOA: 05/18/2019 PCP: Patient, No Pcp Per  Brief History   The patient is a 29 yr old woman with a medical history significant for hepatitis C, IV heroin, crystal meth abuse, and kidney stone presented to the emergency room at [facility] with about 1 week of right flank pain. She was found to have cavitary pulmonary lesions with septic emboli. Blood cultures with MRSA. 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  Assessment and Plan MRSA bacteremia with severe sepsis, tricuspid valve endocarditis, multilevel vertebral osteomyelitis and septic pulmonary emboli:  Sepsis improved.   Repeat blood cultures negative from 1/29. On IV vancomycin and followed by infectious disease.   She will need prolonged antibiotic therapy and will need to stay in the hospital as she is not outpatient therapy candidate.    Bilateral septic shoulder arthritis: Underwent arthrocentesis and debridement, 05/25/2019.  Followed by orthopedic surgery.    Suspected right wrist septic arthritis: Clinically improved.  Followed by orthopedics.    Vertebral osteomyelitis with suspected S1 nerve compression: No cauda equina.  Neurosurgery consulted.  Anticipating conservative management.     Abnormal LFTs: From sepsis.  Liver ultrasound was benign.  Fairly stabilizing.  Will need close follow-up.  E. coli UTI: Treated with ceftriaxone and finished therapy.  Anemia of chronic disease: 1 unit blood transfusion.  1 unit IV iron given.  Stable.  Polysubstance abuse/IV drug use: Counseled.  On methadone.  Stable.   DVT prophylaxis:  lovenox Code Status: Full Code Family Communication: None available Disposition Plan: Work up for suspected widespread osteomyelitis and tricuspid endocarditis. Further disposition will depend on results of work up and plans for treatment. Due to the patient's history of heroin abuse, I doubt that outpatient  treatment of her MRSA bacteremia/endocarditis/osteomyelitis would be appropriate.  Consultants  . Infectious disease . Cardiology . Orthopedics Dion Saucier)  Procedures  . Transfusion of one unit of PRBC's.  Antibiotics   Anti-infectives (From admission, onward)   Start     Dose/Rate Route Frequency Ordered Stop   05/25/19 1230  amoxicillin (AMOXIL) capsule 500 mg     500 mg Oral  Once 05/25/19 1120 05/25/19 1435   05/21/19 1000  vancomycin (VANCOCIN) IVPB 1000 mg/200 mL premix     1,000 mg 200 mL/hr over 60 Minutes Intravenous Every 12 hours 05/21/19 0948     05/20/19 1400  cefTRIAXone (ROCEPHIN) 2 g in sodium chloride 0.9 % 100 mL IVPB  Status:  Discontinued     2 g 200 mL/hr over 30 Minutes Intravenous Every 24 hours 05/20/19 1328 05/22/19 0947   05/19/19 2200  levofloxacin (LEVAQUIN) IVPB 750 mg  Status:  Discontinued     750 mg 100 mL/hr over 90 Minutes Intravenous Every 24 hours 05/19/19 0212 05/19/19 0213   05/19/19 2200  levofloxacin (LEVAQUIN) IVPB 500 mg  Status:  Discontinued     500 mg 100 mL/hr over 60 Minutes Intravenous Every 24 hours 05/19/19 0213 05/19/19 0539   05/19/19 1000  vancomycin (VANCOREADY) IVPB 750 mg/150 mL  Status:  Discontinued     750 mg 150 mL/hr over 60 Minutes Intravenous Every 12 hours 05/19/19 0539 05/21/19 0948   05/19/19 0600  ceFEPIme (MAXIPIME) 2 g in sodium chloride 0.9 % 100 mL IVPB  Status:  Discontinued     2 g 200 mL/hr over 30 Minutes Intravenous Every 8 hours 05/19/19 0539 05/19/19 2348   05/19/19 0130  vancomycin (VANCOCIN) IVPB 1000 mg/200  mL premix     1,000 mg 200 mL/hr over 60 Minutes Intravenous  Once 05/19/19 0122 05/19/19 0253   05/18/19 2345  levofloxacin (LEVAQUIN) IVPB 750 mg     750 mg 100 mL/hr over 90 Minutes Intravenous  Once 05/18/19 2338 05/19/19 0140     Subjective  Patient seen and examined in the morning.  Went to examine her.  She wanted to sleep.  She did not want me to touch her shoulders.  Afebrile  overnight.  Objective   Vitals:  Vitals:   05/26/19 0624 05/26/19 1100  BP: 108/89 (!) 117/92  Pulse: 62 75  Resp: 18 16  Temp: 98.1 F (36.7 C) 97.8 F (36.6 C)  SpO2: 98% 99%   Exam:  Gen: awake, alert, NAD. Cooperative.  Chronically sick looking.  Not in any distress. HEENT: NCAT. Neck supple.  No JVD, no icterus Lungs: BBS, CTAB CVS: normal S1-S2, regular rhythm, tachycardic,  Abd: soft, Non tender, non distended, BS present Extremities: Bilateral shoulders with surgical dressing.  Not removed by me.  Limited range of motion.  Distal neurovascular status intact.   Skin: no new rashes  I have personally reviewed the following:   Today's Data  . T Max 24, Vitals, CMP, CBC  Micro Data  . Blood cultures x 2 from 05/18/2019 MRSA . Blood cultures x 2 from 05/18/2019 - No growth . Urine Culture 05/18/2019 - E. coli  Imaging  . CT renal stone study: Multiple cavitary and non-cavitary pulmonary lesions consistent with septic emboli. . MRI MRI of shoulders reveal large joint effusion indicating at least septic arthritis. MRI of spine also reveals acute discitis/osteomyelitis at L5-S1 level with ventral epidural enhancement representing phlegmon with partial effacement of the thecal sac and potential compression of traversing S1 nerve roots.  Cardiology Data  . Echocardiogram 05/19/2019 demonstrated severe tricuspid regurgitation and abnormalities suspicious for valvular vegetation due to endocarditis. . TEE: 05/23/19 Tricuspid valve endocarditis with MRSA bacteremia, with likely paradoxical septic embolization (renal septic emboli).  Scheduled Meds: . enoxaparin (LOVENOX) injection  40 mg Subcutaneous Q24H  . ferrous sulfate  325 mg Oral BID  . methadone  15 mg Oral Daily  . prenatal multivitamin  1 tablet Oral Daily   Continuous Infusions: . lactated ringers 10 mL/hr at 05/24/19 0902  . vancomycin 1,000 mg (05/25/19 0924)    Principal Problem:   Septic arthritis of  shoulder, left (HCC) Active Problems:   Injection of illicit drug within last 12 months   Hepatitis C antibody positive, s/p spontaneously cleared infection   Sepsis ([place])   Septic pulmonary embolism ([place])   Substance use disorder   Suspected endocarditis   MRSA bacteremia   IV drug abuse ([place])   Septic embolism (HCC)   Staphylococcal arthritis of right shoulder (HCC)   Staphylococcal arthritis of left wrist (HCC)   PFO (patent foramen ovale)   Endocarditis of tricuspid valve   Septic arthritis of shoulder, right (HCC)   LOS: 7 days   Total time spent: 30 minutes

## 2019-05-27 DIAGNOSIS — I079 Rheumatic tricuspid valve disease, unspecified: Secondary | ICD-10-CM | POA: Diagnosis not present

## 2019-05-27 DIAGNOSIS — M00012 Staphylococcal arthritis, left shoulder: Secondary | ICD-10-CM | POA: Diagnosis not present

## 2019-05-27 DIAGNOSIS — F1990 Other psychoactive substance use, unspecified, uncomplicated: Secondary | ICD-10-CM | POA: Diagnosis not present

## 2019-05-27 DIAGNOSIS — R768 Other specified abnormal immunological findings in serum: Secondary | ICD-10-CM | POA: Diagnosis not present

## 2019-05-27 MED ORDER — CYCLOBENZAPRINE HCL 10 MG PO TABS
10.0000 mg | ORAL_TABLET | Freq: Three times a day (TID) | ORAL | Status: DC | PRN
  Administered 2019-05-27 – 2019-06-06 (×21): 10 mg via ORAL
  Filled 2019-05-27 (×22): qty 1

## 2019-05-27 MED ORDER — KETOROLAC TROMETHAMINE 15 MG/ML IJ SOLN: 7.5000 mg | Freq: Four times a day (QID) | INTRAMUSCULAR | Status: AC | PRN

## 2019-05-27 MED ORDER — IBUPROFEN 600 MG PO TABS
600.0000 mg | ORAL_TABLET | Freq: Four times a day (QID) | ORAL | Status: DC | PRN
  Administered 2019-05-28 – 2019-06-06 (×22): 600 mg via ORAL
  Filled 2019-05-27 (×23): qty 1

## 2019-05-27 NOTE — Progress Notes (Signed)
PROGRESS NOTE  Christy Pearson GBT:[id number] DOB: 10-17-1990 DOA: 05/18/2019 PCP: Patient, No Pcp Per  Brief History   The patient is a 29 yr old woman with a medical history significant for hepatitis C, IV heroin, crystal meth abuse, and kidney stone presented to the emergency room at [facility] with about 1 week of right flank pain. She was found to have cavitary pulmonary lesions with septic emboli. Blood cultures with MRSA. 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  Assessment and Plan MRSA bacteremia with severe sepsis, tricuspid valve endocarditis, multilevel vertebral osteomyelitis and septic pulmonary emboli:  Sepsis improved.   Repeat blood cultures negative from 1/29. On IV vancomycin and followed by infectious disease.   She will need prolonged antibiotic therapy and will need to stay in the hospital as she is not outpatient therapy candidate.    Bilateral septic shoulder arthritis: Underwent arthrocentesis and debridement, 05/25/2019.  Followed by orthopedic surgery.   No growth so far.  Weightbearing and mobility.  Suspected right wrist septic arthritis: Clinically improved.  Followed by orthopedics.  No need for intervention.  Vertebral osteomyelitis with suspected S1 nerve compression: No cauda equina.  Neurosurgery has seen the patient.   Mobility.  Abnormal LFTs: From sepsis.  Liver ultrasound was benign.  Fairly stabilizing.  Will need close follow-up.  Recheck in the morning.  E. coli UTI: Treated with ceftriaxone and finished therapy.  Anemia of chronic disease: 1 unit blood transfusion.  1 unit IV iron given.  Stable.  Recheck hemoglobin in the morning.  Polysubstance abuse/IV drug use: Counseled.  On methadone.  Stable.   DVT prophylaxis:  lovenox Code Status: Full Code Family Communication: None available Disposition Plan: Work up for suspected widespread osteomyelitis and tricuspid endocarditis. Further disposition will depend on  results of work up and plans for treatment. Due to the patient's history of heroin abuse, I doubt that outpatient treatment of her MRSA bacteremia/endocarditis/osteomyelitis would be appropriate.  Consultants  . Infectious disease . Cardiology . Orthopedics Mardelle Matte)  Procedures  . Transfusion of one unit of PRBC's.  Antibiotics   Anti-infectives (From admission, onward)   Start     Dose/Rate Route Frequency Ordered Stop   05/25/19 1230  amoxicillin (AMOXIL) capsule 500 mg     500 mg Oral  Once 05/25/19 1120 05/25/19 1435   05/21/19 1000  vancomycin (VANCOCIN) IVPB 1000 mg/200 mL premix     1,000 mg 200 mL/hr over 60 Minutes Intravenous Every 12 hours 05/21/19 0948     05/20/19 1400  cefTRIAXone (ROCEPHIN) 2 g in sodium chloride 0.9 % 100 mL IVPB  Status:  Discontinued     2 g 200 mL/hr over 30 Minutes Intravenous Every 24 hours 05/20/19 1328 05/22/19 0947   05/19/19 2200  levofloxacin (LEVAQUIN) IVPB 750 mg  Status:  Discontinued     750 mg 100 mL/hr over 90 Minutes Intravenous Every 24 hours 05/19/19 0212 05/19/19 0213   05/19/19 2200  levofloxacin (LEVAQUIN) IVPB 500 mg  Status:  Discontinued     500 mg 100 mL/hr over 60 Minutes Intravenous Every 24 hours 05/19/19 0213 05/19/19 0539   05/19/19 1000  vancomycin (VANCOREADY) IVPB 750 mg/150 mL  Status:  Discontinued     750 mg 150 mL/hr over 60 Minutes Intravenous Every 12 hours 05/19/19 0539 05/21/19 0948   05/19/19 0600  ceFEPIme (MAXIPIME) 2 g in sodium chloride 0.9 % 100 mL IVPB  Status:  Discontinued     2 g 200 mL/hr  over 30 Minutes Intravenous Every 8 hours 05/19/19 0539 05/19/19 2348   05/19/19 0130  vancomycin (VANCOCIN) IVPB 1000 mg/200 mL premix     1,000 mg 200 mL/hr over 60 Minutes Intravenous  Once 05/19/19 0122 05/19/19 0253   05/18/19 2345  levofloxacin (LEVAQUIN) IVPB 750 mg     750 mg 100 mL/hr over 90 Minutes Intravenous  Once 05/18/19 2338 05/19/19 0140     Subjective  Patient was seen and examined.  No  overnight events.  Encouraged her to get up and walk, patient states that she can stand however it hurts her back.  Afebrile.  Objective   Vitals:  Vitals:   05/26/19 1426 05/27/19 0532  BP: 118/77 (!) 126/95  Pulse: 77 79  Resp: 18 16  Temp: 97.9 F (36.6 C) 99.4 F (37.4 C)  SpO2: 97% 98%   Exam:  Gen: awake, alert, NAD. Cooperative.  Chronically sick looking.  Not in any distress. HEENT: NCAT. Neck supple.  No JVD, no icterus Lungs: BBS, CTAB CVS: normal S1-S2, regular rhythm, tachycardic,  Abd: soft, Non tender, non distended, BS present Extremities: Bilateral shoulders with surgical dressing.  Not removed by me.  Limited range of motion.  Distal neurovascular status intact.  Wrist joint is normal with no pain. Skin: no new rashes  I have personally reviewed the following:   Today's Data  . T Max 24, Vitals, CMP, CBC  Micro Data  . Blood cultures x 2 from 05/18/2019 MRSA . Blood cultures x 2 from 05/18/2019 - No growth . Urine Culture 05/18/2019 - E. coli  Imaging  . CT renal stone study: Multiple cavitary and non-cavitary pulmonary lesions consistent with septic emboli. . MRI MRI of shoulders reveal large joint effusion indicating at least septic arthritis. MRI of spine also reveals acute discitis/osteomyelitis at L5-S1 level with ventral epidural enhancement representing phlegmon with partial effacement of the thecal sac and potential compression of traversing S1 nerve roots.  Cardiology Data  . Echocardiogram 05/19/2019 demonstrated severe tricuspid regurgitation and abnormalities suspicious for valvular vegetation due to endocarditis. . TEE: 05/23/19 Tricuspid valve endocarditis with MRSA bacteremia, with likely paradoxical septic embolization (renal septic emboli).  Scheduled Meds: . enoxaparin (LOVENOX) injection  40 mg Subcutaneous Q24H  . ferrous sulfate  325 mg Oral BID  . methadone  15 mg Oral Daily  . prenatal multivitamin  1 tablet Oral Daily   Continuous  Infusions: . lactated ringers 10 mL/hr at 05/24/19 0902  . vancomycin 1,000 mg (05/27/19 1225)    Principal Problem:   Septic arthritis of shoulder, left (HCC) Active Problems:   Injection of illicit drug within last 12 months   Hepatitis C antibody positive, s/p spontaneously cleared infection   Sepsis (HCC)   Septic pulmonary embolism (HCC)   Substance use disorder   Suspected endocarditis   MRSA bacteremia   IV drug abuse (HCC)   Septic embolism (HCC)   Staphylococcal arthritis of right shoulder (HCC)   Staphylococcal arthritis of left wrist (HCC)   PFO (patent foramen ovale)   Endocarditis of tricuspid valve   Septic arthritis of shoulder, right (HCC)   LOS: 8 days   Total time spent: 30 minutes

## 2019-05-27 NOTE — Progress Notes (Signed)
Occupational Therapy Treatment Patient Details Name: Christy Pearson MRN: [id number] DOB: Mar 29, 1991 Today's Date: 05/27/2019    History of present illness 29 yo female admitted with fevere chills and back pain. 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 PMH hepatitis C, Kidney stones, IV drug abuse,    OT comments  Pt seen for OT f/u with focus on BUE exercise to promote increased ROM and function in BUEs. Pt reports increased pain in RUE this date. She continues to deny OOB activity, but does state that she was up with RN staff earlier. Continues to present self limiting. LUE able to reach ~120 degrees FF and RUE ~60 degrees FF. Completed all shoulder exercises listed below, including circumduction as well. Educated pt on incorporating BADL with exercise to promote increased ROM as well as appropriate sleep positions. Will continue to follow, d/c recs appropriate.    Follow Up Recommendations  No OT follow up;Follow surgeon's recommendation for DC plan and follow-up therapies    Equipment Recommendations  3 in 1 bedside commode    Recommendations for Other Services      Precautions / Restrictions Precautions Precautions: Fall Restrictions Weight Bearing Restrictions: No RUE Weight Bearing: Weight bearing as tolerated LUE Weight Bearing: Weight bearing as tolerated       Mobility Bed Mobility               General bed mobility comments: adamentaly declines OOB, states she was just up with nursing  Transfers                      Balance                                           ADL either performed or assessed with clinical judgement   ADL Overall ADL's : Needs assistance/impaired                                       General ADL Comments: session focused on BUE ther  ex for increased ROM     Vision       Perception     Praxis      Cognition Arousal/Alertness: Awake/alert Behavior During Therapy: Anxious Overall Cognitive Status: Within Functional Limits for tasks assessed                                 General Comments: self limiting        Exercises Shoulder Exercises Shoulder Flexion: AROM;AAROM;Both;5 reps;Supine Shoulder ABduction: AROM;AAROM;Both;Supine Shoulder External Rotation: AROM;AAROM;5 reps;Supine;Both   Shoulder Instructions       General Comments      Pertinent Vitals/ Pain       Pain Assessment: Faces Faces Pain Scale: Hurts even more Pain Location: B shoulders (R>L) Pain Descriptors / Indicators: Guarding;Sore Pain Intervention(s): Limited activity within patient's tolerance;Monitored during session  Home Living                                          Prior Functioning/Environment  Frequency  Min 3X/week        Progress Toward Goals  OT Goals(current goals can now be found in the care plan section)  Progress towards OT goals: Progressing toward goals  Acute Rehab OT Goals Patient Stated Goal: less pain OT Goal Formulation: With patient Time For Goal Achievement: 06/09/19  Plan Discharge plan remains appropriate    Co-evaluation                 AM-PAC OT "6 Clicks" Daily Activity     Outcome Measure   Help from another person eating meals?: None Help from another person taking care of personal grooming?: A Little Help from another person toileting, which includes using toliet, bedpan, or urinal?: A Little Help from another person bathing (including washing, rinsing, drying)?: A Little Help from another person to put on and taking off regular upper body clothing?: A Little Help from another person to put on and taking off regular lower body clothing?: A Lot 6 Click Score: 18    End of Session Equipment Utilized During Treatment:  Gait belt  OT Visit Diagnosis: Unsteadiness on feet (R26.81);Muscle weakness (generalized) (M62.81);Pain Pain - Right/Left: Right Pain - part of body: Shoulder   Activity Tolerance Patient tolerated treatment well   Patient Left in bed;with call bell/phone within reach   Nurse Communication Mobility status;Precautions        Time: 5809-9833 OT Time Calculation (min): 18 min  Charges: OT General Charges $OT Visit: 1 Visit OT Treatments $Therapeutic Exercise: 8-22 mins  Zenovia Jarred, MSOT, OTR/L Acute Rehabilitation Services [facility] Office Number: [phone]  Zenovia Jarred 05/27/2019, 5:20 PM

## 2019-05-27 NOTE — Progress Notes (Signed)
Subjective: 2 Days Post-Op Procedure(s) (LRB): ARTHROSCOPY SHOULDER (Bilateral) Patient reports pain as 3 on 0-10 scale.    Objective: Vital signs in last 24 hours: Temp:  [98.2 F (36.8 C)-99.4 F (37.4 C)] 98.2 F (36.8 C) (02/06 1354) Pulse Rate:  [79-83] 83 (02/06 1354) Resp:  [16] 16 (02/06 1354) BP: (125-126)/(86-95) 125/86 (02/06 1354) SpO2:  [98 %-100 %] 100 % (02/06 1354)  Intake/Output from previous day: 02/05 0701 - 02/06 0700 In: 1715 [P.O.:1137; I.V.:328; IV Piggyback:250] Out: 350 [Urine:350] Intake/Output this shift: Total I/O In: 300 [P.O.:300] Out: 1600 [Urine:1600]  No results for input(s): HGB in the last 72 hours. No results for input(s): WBC, RBC, HCT, PLT in the last 72 hours. No results for input(s): NA, K, CL, CO2, BUN, CREATININE, GLUCOSE, CALCIUM in the last 72 hours. No results for input(s): LABPT, INR in the last 72 hours.  ABD soft Neurovascular intact shoulder ROM on the right FF70   left FF120   working with OT   Assessment/Plan: 2 Days Post-Op Procedure(s) (LRB): ARTHROSCOPY SHOULDER (Bilateral) Up with therapy      Jeramey Lanuza J Jayjay Littles 05/27/2019, 3:14 PM

## 2019-05-28 DIAGNOSIS — R768 Other specified abnormal immunological findings in serum: Secondary | ICD-10-CM | POA: Diagnosis not present

## 2019-05-28 DIAGNOSIS — F1990 Other psychoactive substance use, unspecified, uncomplicated: Secondary | ICD-10-CM | POA: Diagnosis not present

## 2019-05-28 DIAGNOSIS — M00012 Staphylococcal arthritis, left shoulder: Secondary | ICD-10-CM | POA: Diagnosis not present

## 2019-05-28 DIAGNOSIS — I079 Rheumatic tricuspid valve disease, unspecified: Secondary | ICD-10-CM | POA: Diagnosis not present

## 2019-05-28 LAB — COMPREHENSIVE METABOLIC PANEL
ALT: 65 U/L — ABNORMAL HIGH (ref 0–44)
AST: 37 U/L (ref 15–41)
Albumin: 1.9 g/dL — ABNORMAL LOW (ref 3.5–5.0)
Alkaline Phosphatase: 134 U/L — ABNORMAL HIGH (ref 38–126)
Anion gap: 10 (ref 5–15)
BUN: 5 mg/dL — ABNORMAL LOW (ref 6–20)
CO2: 24 mmol/L (ref 22–32)
Calcium: 8.3 mg/dL — ABNORMAL LOW (ref 8.9–10.3)
Chloride: 103 mmol/L (ref 98–111)
Creatinine, Ser: 0.61 mg/dL (ref 0.44–1.00)
GFR calc Af Amer: >60 mL/min (ref >60)
GFR calc non Af Amer: >60 mL/min (ref >60)
Glucose, Bld: 113 mg/dL — ABNORMAL HIGH (ref 70–99)
Potassium: 3.8 mmol/L (ref 3.5–5.1)
Sodium: 137 mmol/L (ref 135–145)
Total Bilirubin: 0.6 mg/dL (ref 0.3–1.2)
Total Protein: 6 g/dL — ABNORMAL LOW (ref 6.5–8.1)

## 2019-05-28 LAB — CBC
HCT: 26.1 % — ABNORMAL LOW (ref 36.0–46.0)
Hemoglobin: 8 g/dL — ABNORMAL LOW (ref 12.0–15.0)
MCH: 25.4 pg — ABNORMAL LOW (ref 26.0–34.0)
MCHC: 30.7 g/dL (ref 30.0–36.0)
MCV: 82.9 fL (ref 80.0–100.0)
Platelets: 393 10*3/uL (ref 150–400)
RBC: 3.15 MIL/uL — ABNORMAL LOW (ref 3.87–5.11)
RDW: 20.9 % — ABNORMAL HIGH (ref 11.5–15.5)
WBC: 6.7 10*3/uL (ref 4.0–10.5)
nRBC: 0 % (ref 0.0–0.2)

## 2019-05-28 LAB — PHOSPHORUS: Phosphorus: 3.5 mg/dL (ref 2.5–4.6)

## 2019-05-28 LAB — MAGNESIUM: Magnesium: 1.5 mg/dL — ABNORMAL LOW (ref 1.7–2.4)

## 2019-05-28 MED ORDER — MAGNESIUM SULFATE 2 GM/50ML IV SOLN
2.0000 g | Freq: Once | INTRAVENOUS | Status: AC
  Administered 2019-05-28: 12:00:00 2 g via INTRAVENOUS
  Filled 2019-05-28: qty 50

## 2019-05-28 MED ORDER — MAGNESIUM OXIDE 400 (241.3 MG) MG PO TABS
400.0000 mg | ORAL_TABLET | Freq: Two times a day (BID) | ORAL | Status: DC
  Administered 2019-05-28 – 2019-06-30 (×65): 400 mg via ORAL
  Filled 2019-05-28 (×66): qty 1

## 2019-05-28 NOTE — Progress Notes (Signed)
PROGRESS NOTE  Christy Pearson EQA:[id number] DOB: 09/16/1990 DOA: 05/18/2019 PCP: Patient, No Pcp Per  Brief History   The patient is a 29 yr old woman with a medical history significant for hepatitis C, IV heroin, crystal meth abuse, and kidney stone presented to the emergency room at [facility] with about 1 week of right flank pain. She was found to have cavitary pulmonary lesions with septic emboli. Blood cultures with MRSA. 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  Assessment and Plan MRSA bacteremia with severe sepsis, tricuspid valve endocarditis, multilevel vertebral osteomyelitis and septic pulmonary emboli:  Sepsis improved.   Repeat blood cultures negative from 1/29. On IV vancomycin and followed by infectious disease.   She will need prolonged antibiotic therapy and will need to stay in the hospital as she is not outpatient therapy candidate.    Bilateral septic shoulder arthritis: Underwent arthrocentesis and debridement, 05/25/2019.  Followed by orthopedic surgery.  No growth so far.  Weightbearing and mobility.  Suspected right wrist septic arthritis: Clinically improved.  Followed by orthopedics.  No need for intervention.  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  Neurosurgery has seen the patient.   Mobility.  Abnormal LFTs: From sepsis.  Liver ultrasound was benign.  Normalized.  E. coli UTI: Treated with ceftriaxone and finished therapy.  Anemia of chronic disease: 1 unit blood transfusion.  1 unit IV iron given.  Hemoglobin 8 today.  Polysubstance abuse/IV drug use: Counseled.  On methadone.  Stable.   DVT prophylaxis:  lovenox Code Status: Full Code Family Communication: None available Disposition Plan: Work up for suspected widespread osteomyelitis and tricuspid endocarditis. Further disposition will depend on results of work up and plans for treatment. Due to the patient's history of  heroin abuse, I doubt that outpatient treatment of her MRSA bacteremia/endocarditis/osteomyelitis would be appropriate.  Consultants  . Infectious disease . Cardiology . Orthopedics Mardelle Matte)  Procedures  . Transfusion of one unit of PRBC's.  Antibiotics   Anti-infectives (From admission, onward)   Start     Dose/Rate Route Frequency Ordered Stop   05/25/19 1230  amoxicillin (AMOXIL) capsule 500 mg     500 mg Oral  Once 05/25/19 1120 05/25/19 1435   05/21/19 1000  vancomycin (VANCOCIN) IVPB 1000 mg/200 mL premix     1,000 mg 200 mL/hr over 60 Minutes Intravenous Every 12 hours 05/21/19 0948     05/20/19 1400  cefTRIAXone (ROCEPHIN) 2 g in sodium chloride 0.9 % 100 mL IVPB  Status:  Discontinued     2 g 200 mL/hr over 30 Minutes Intravenous Every 24 hours 05/20/19 1328 05/22/19 0947   05/19/19 2200  levofloxacin (LEVAQUIN) IVPB 750 mg  Status:  Discontinued     750 mg 100 mL/hr over 90 Minutes Intravenous Every 24 hours 05/19/19 0212 05/19/19 0213   05/19/19 2200  levofloxacin (LEVAQUIN) IVPB 500 mg  Status:  Discontinued     500 mg 100 mL/hr over 60 Minutes Intravenous Every 24 hours 05/19/19 0213 05/19/19 0539   05/19/19 1000  vancomycin (VANCOREADY) IVPB 750 mg/150 mL  Status:  Discontinued     750 mg 150 mL/hr over 60 Minutes Intravenous Every 12 hours 05/19/19 0539 05/21/19 0948   05/19/19 0600  ceFEPIme (MAXIPIME) 2 g in sodium chloride 0.9 % 100 mL IVPB  Status:  Discontinued     2 g 200 mL/hr over 30 Minutes Intravenous Every 8 hours 05/19/19 0539 05/19/19 2348   05/19/19 0130  vancomycin (VANCOCIN) IVPB 1000 mg/200 mL premix     1,000 mg 200 mL/hr over 60 Minutes Intravenous  Once 05/19/19 0122 05/19/19 0253   05/18/19 2345  levofloxacin (LEVAQUIN) IVPB 750 mg     750 mg 100 mL/hr over 90 Minutes Intravenous  Once 05/18/19 2338 05/19/19 0140     Subjective  Seen and examined.  Some back pain and shoulder pain persist otherwise no other overnight events. Objective    Vitals:  Vitals:   05/27/19 2135 05/28/19 0502  BP: 113/77 116/87  Pulse: 65 77  Resp: 19 19  Temp: 97.9 F (36.6 C) 99.6 F (37.6 C)  SpO2: 99% 99%   Exam:  Gen: awake, alert, NAD. Cooperative.  Chronically sick looking.  Not in any distress. HEENT: NCAT. Neck supple.  No JVD, no icterus Lungs: BBS, CTAB CVS: normal S1-S2, regular rhythm,  Abd: soft, Non tender, non distended, BS present Extremities: Bilateral shoulders with surgical dressing.  Limited range of motion.  Distal neurovascular status intact.  Wrist joint is normal with no pain. Skin: no new rashes  I have personally reviewed the following:   Today's Data  . T Max 24, Vitals, CMP, CBC  Micro Data  . Blood cultures x 2 from 05/18/2019 MRSA . Blood cultures x 2 from 05/18/2019 - No growth . Urine Culture 05/18/2019 - E. coli  Imaging  . CT renal stone study: Multiple cavitary and non-cavitary pulmonary lesions consistent with septic emboli. . MRI MRI of shoulders reveal large joint effusion indicating at least septic arthritis. MRI of spine also reveals acute discitis/osteomyelitis at L5-S1 level with ventral epidural enhancement representing phlegmon with partial effacement of the thecal sac and potential compression of traversing S1 nerve roots.  Cardiology Data  . Echocardiogram 05/19/2019 demonstrated severe tricuspid regurgitation and abnormalities suspicious for valvular vegetation due to endocarditis. . TEE: 05/23/19 Tricuspid valve endocarditis with MRSA bacteremia, with likely paradoxical septic embolization (renal septic emboli).  Scheduled Meds: . enoxaparin (LOVENOX) injection  40 mg Subcutaneous Q24H  . ferrous sulfate  325 mg Oral BID  . methadone  15 mg Oral Daily  . prenatal multivitamin  1 tablet Oral Daily   Continuous Infusions: . lactated ringers 10 mL/hr at 05/24/19 0902  . vancomycin 1,000 mg (05/28/19 1011)    Principal Problem:   Septic arthritis of shoulder, left (HCC) Active  Problems:   Injection of illicit drug within last 12 months   Hepatitis C antibody positive, s/p spontaneously cleared infection   Sepsis (HCC)   Septic pulmonary embolism (HCC)   Substance use disorder   Suspected endocarditis   MRSA bacteremia   IV drug abuse (HCC)   Septic embolism (HCC)   Staphylococcal arthritis of right shoulder (HCC)   Staphylococcal arthritis of left wrist (HCC)   PFO (patent foramen ovale)   Endocarditis of tricuspid valve   Septic arthritis of shoulder, right (HCC)   LOS: 9 days   Total time spent: 30 minutes

## 2019-05-28 NOTE — Plan of Care (Signed)

## 2019-05-28 NOTE — Progress Notes (Signed)
Asked pt to allow me to changer her dsg on her shoulders due to them looking dirty and about to fall off.  Pt refused.  Pt sat up in the chair for 30 minutes and then requested to be put back in the bed because of pain. Pt assisted to bed

## 2019-05-28 NOTE — Progress Notes (Signed)
     Subjective: Post-op Day 3: bilateral shoulder arthroscopic irrigation and debridement Patient laying on left side in bed. Reports moderate pain in shoulders, right worse than left. Reports no pain in wrists.   Objective:   VITALS:   Vitals:   05/27/19 0532 05/27/19 1354 05/27/19 2135 05/28/19 0502  BP: (!) 126/95 125/86 113/77 116/87  Pulse: 79 83 65 77  Resp: 16 16 19 19   Temp: 99.4 F (37.4 C) 98.2 F (36.8 C) 97.9 F (36.6 C) 99.6 F (37.6 C)  TempSrc: Oral Oral Oral Oral  SpO2: 98% 100% 99% 99%  Weight:      Height:        ABD soft Neurovascular intact Sensation intact distally Intact pulses distally  Dressing C/D/I Right shoulder forward flexion - Approx. 70 degrees Left shoulder forward flexion -  Approx. 120 degrees  Lab Results  Component Value Date   WBC 6.7 05/28/2019   HGB 8.0 (L) 05/28/2019   HCT 26.1 (L) 05/28/2019   MCV 82.9 05/28/2019   PLT 393 05/28/2019   BMET    Component Value Date/Time   NA 137 05/28/2019 0934   K 3.8 05/28/2019 0934   CL 103 05/28/2019 0934   CO2 24 05/28/2019 0934   GLUCOSE 113 (H) 05/28/2019 0934   BUN 5 (L) 05/28/2019 0934   CREATININE 0.61 05/28/2019 0934   CALCIUM 8.3 (L) 05/28/2019 0934   GFRNONAA >60 05/28/2019 0934   GFRAA >60 05/28/2019 0934     Assessment/Plan: 3 Days Post-Op   Principal Problem:   Septic arthritis of shoulder, left (HCC) Active Problems:   Injection of illicit drug within last 12 months   Hepatitis C antibody positive, s/p spontaneously cleared infection   Sepsis (HCC)   Septic pulmonary embolism (HCC)   Substance use disorder   Suspected endocarditis   MRSA bacteremia   IV drug abuse (HCC)   Septic embolism (HCC)   Staphylococcal arthritis of right shoulder (HCC)   Staphylococcal arthritis of left wrist (HCC)   PFO (patent foramen ovale)   Endocarditis of tricuspid valve   Septic arthritis of shoulder, right (HCC)  Post-op Day 3: bilateral shoulder arthroscopic  irrigation and debridement - pain improving - patient can apply to ice pack to shoulders if she would like - continue OT - watching operative cultures from bilateral shoulders, no growth yet  07/26/2019 05/28/2019, 11:01 AM   07/26/2019, MD Cell [phone]

## 2019-05-29 DIAGNOSIS — M00012 Staphylococcal arthritis, left shoulder: Secondary | ICD-10-CM | POA: Diagnosis not present

## 2019-05-29 DIAGNOSIS — R7989 Other specified abnormal findings of blood chemistry: Secondary | ICD-10-CM | POA: Diagnosis present

## 2019-05-29 DIAGNOSIS — R7881 Bacteremia: Secondary | ICD-10-CM | POA: Diagnosis not present

## 2019-05-29 DIAGNOSIS — I079 Rheumatic tricuspid valve disease, unspecified: Secondary | ICD-10-CM | POA: Diagnosis not present

## 2019-05-29 DIAGNOSIS — B9562 Methicillin resistant Staphylococcus aureus infection as the cause of diseases classified elsewhere: Secondary | ICD-10-CM | POA: Diagnosis not present

## 2019-05-29 DIAGNOSIS — M00011 Staphylococcal arthritis, right shoulder: Secondary | ICD-10-CM | POA: Diagnosis not present

## 2019-05-29 NOTE — Progress Notes (Signed)
Pharmacy Antibiotic Note  Zakara Parkey is a 29 y.o. female admitted on 05/18/2019 with MRSA bacteremia and TV endocarditis. Blood culture on 1/28 was positive for MRSA and repeat blood cultures on 1/29 are no growth to date. TEE found one 0.5cm vegetations on at least 2 leaflets of TV.  Pharmacy has been consulted for vancomycin dosing for endocarditis. Patient has been receiving vancomycin 1000mg  IV q12h. Scr is stable. WBC wnl. Afebrile.   Vancomycin levels obtained on 2/2. Vancomycin trough was 9 and vancomycin peak was 29. Calculated AUC is 442 which is in goal of 400-550.  Of note, MRI of right shoulder found moderate to large glenohumeral joint effusion with synovitis and findings suspicious for septic arthritis in setting of known systemic infection. MRI of left shoulder found small joint effusion suspicious for septic arthritis and concerning of osteomyelitis. MRI of left wrist found midcarpal joint effusion concerning for possible septic arthritis. Bilateral should arthroscopy was performed on 2/4. MRI spine found possible phlegmon at L5-S1 and presumed discitis/osteomyelitis. Neurosurgery consulted.   Plan: Continue vancomycin 1000mg  IV q12h  Obtain vancomycin peak at 0000 on 2/9 and vancomycin trough at 0930 on 2/9 Monitor renal function, cultures/sensitivites, and clinical progression  Follow up CVTS recommendations   Height: 5' 7.01" (170.2 cm) Weight: 124 lb 9 oz (56.5 kg) IBW/kg (Calculated) : 61.62  Temp (24hrs), Avg:97.6 F (36.4 C), Min:97.6 F (36.4 C), Max:97.6 F (36.4 C)  Recent Labs  Lab 05/23/19 0536 05/23/19 1006 05/23/19 1412 05/24/19 0224 05/28/19 0934  WBC 9.2  --   --  7.9 6.7  CREATININE 0.55  --   --  0.53 0.61  VANCOTROUGH  --  9*  --   --   --   VANCOPEAK  --   --  29*  --   --     Estimated Creatinine Clearance: 95 mL/min (by C-G formula based on SCr of 0.61 mg/dL).    Allergies  Allergen Reactions  . Tylenol [Acetaminophen] Rash    Pt just  reported tylenol allergy at this visit at 2338pm    Antimicrobials this admission: Vancomycin 1/29 >>  CTX 1/30 >> 2/1 Cefepime 1/29 x3 doses  Dose adjustments this admission: 1/31: VP 29, VT 9, AUC 366 - increased dose to 1000 mg Q12 hrs  Microbiology results: 1/28 BCx: MRSA 1/28 UCx: Ecoli (UA contaminated)  1/29 resp panel: neg 1/29 BCx: ngtd 2/4 R shoulder fluid: no organisms 2/4 L shoulder fluid: no organisms  2/29, PharmD PGY1 Pharmacy Resident [employer]: [phone]  05/29/2019 8:45 AM

## 2019-05-29 NOTE — Progress Notes (Signed)
PROGRESS NOTE  Christy Pearson PFX:[id number] DOB: June 23, 1990 DOA: 05/18/2019 PCP: Patient, No Pcp Per  Brief History   The patient is a 29 yr old woman with a medical history significant for hepatitis C, IV heroin, crystal meth abuse, and kidney stone presented to the emergency room at [facility] with about 1 week of right flank pain.  She was found to have cavitary pulmonary lesions with septic emboli.  Blood cultures with MRSA. 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  Assessment and Plan # MRSA bacteremia with severe sepsis, tricuspid valve endocarditis, multilevel vertebral osteomyelitis and septic pulmonary emboli:  Sepsis improved.   Repeat blood cultures negative from 1/29. On IV vancomycin and followed by infectious disease.   She will need prolonged antibiotic therapy and will need to stay in the hospital as she is not outpatient therapy candidate.    Bilateral septic shoulder arthritis: Underwent arthrocentesis and debridement, 05/25/2019.  Followed by orthopedic surgery.  No growth so far.  Weightbearing and mobility.  Suspected right wrist septic arthritis: Clinically improved.    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  Neurosurgery has seen the patient.   Mobility.  Abnormal LFTs: From sepsis.  Liver ultrasound was benign.  Normalized.  E. coli UTI: Treated with ceftriaxone and finished therapy.  Anemia of chronic disease: 1 unit blood transfusion.  1 unit IV iron given.  Hemoglobin 8.  Polysubstance abuse/IV drug use: Counseled.  On methadone.  Stable.   DVT prophylaxis:  lovenox Code Status: Full Code Family Communication: None available Disposition Plan: Patient from home.  He still on active treatment for MRSA bacteremia and endocarditis. She is an active IV drug user, she is not safe for discharge to use outpatient antibiotic therapy.  Anticipate she will stay in the hospital for some time to  finish her antibiotic therapy.  Consultants  . Infectious disease . Cardiology . Orthopedics Dion Saucier)  Procedures  . Transfusion of one unit of PRBC's.  Antibiotics   Anti-infectives (From admission, onward)   Start     Dose/Rate Route Frequency Ordered Stop   05/25/19 1230  amoxicillin (AMOXIL) capsule 500 mg     500 mg Oral  Once 05/25/19 1120 05/25/19 1435   05/21/19 1000  vancomycin (VANCOCIN) IVPB 1000 mg/200 mL premix     1,000 mg 200 mL/hr over 60 Minutes Intravenous Every 12 hours 05/21/19 0948     05/20/19 1400  cefTRIAXone (ROCEPHIN) 2 g in sodium chloride 0.9 % 100 mL IVPB  Status:  Discontinued     2 g 200 mL/hr over 30 Minutes Intravenous Every 24 hours 05/20/19 1328 05/22/19 0947   05/19/19 2200  levofloxacin (LEVAQUIN) IVPB 750 mg  Status:  Discontinued     750 mg 100 mL/hr over 90 Minutes Intravenous Every 24 hours 05/19/19 0212 05/19/19 0213   05/19/19 2200  levofloxacin (LEVAQUIN) IVPB 500 mg  Status:  Discontinued     500 mg 100 mL/hr over 60 Minutes Intravenous Every 24 hours 05/19/19 0213 05/19/19 0539   05/19/19 1000  vancomycin (VANCOREADY) IVPB 750 mg/150 mL  Status:  Discontinued     750 mg 150 mL/hr over 60 Minutes Intravenous Every 12 hours 05/19/19 0539 05/21/19 0948   05/19/19 0600  ceFEPIme (MAXIPIME) 2 g in sodium chloride 0.9 % 100 mL IVPB  Status:  Discontinued     2 g 200 mL/hr over 30 Minutes Intravenous Every 8 hours 05/19/19 0539 05/19/19 2348   05/19/19 0130  vancomycin (VANCOCIN) IVPB 1000 mg/200 mL premix     1,000 mg 200 mL/hr over 60 Minutes Intravenous  Once 05/19/19 0122 05/19/19 0253   05/18/19 2345  levofloxacin (LEVAQUIN) IVPB 750 mg     750 mg 100 mL/hr over 90 Minutes Intravenous  Once 05/18/19 2338 05/19/19 0140     Subjective  Seen and examined.  No overnight events.  Not very keen to mobility in conversation. Objective   Vitals:  Vitals:   05/28/19 2211 05/29/19 0446  BP: 98/72 105/79  Pulse: 71 65  Resp: 18 18    Temp: 97.6 F (36.4 C)   SpO2: 98% 99%   Exam:  Gen: awake, alert, NAD. Cooperative.  Chronically sick looking.  Not in any distress. HEENT: NCAT. Neck supple.  No JVD, no icterus Lungs: BBS, CTAB CVS: normal S1-S2, regular rhythm,  Abd: soft, Non tender, non distended, BS present Extremities: Bilateral shoulders with surgical dressing.  Limited range of motion.  Distal neurovascular status intact.  Wrist joint is normal with no pain. Skin: no new rashes  I have personally reviewed the following:   Today's Data  . T Max 24, Vitals, CMP, CBC  Micro Data  . Blood cultures x 2 from 05/18/2019 MRSA . Blood cultures x 2 from 05/18/2019 - No growth . Urine Culture 05/18/2019 - E. coli  Imaging  . CT renal stone study: Multiple cavitary and non-cavitary pulmonary lesions consistent with septic emboli. . MRI MRI of shoulders reveal large joint effusion indicating at least septic arthritis. MRI of spine also reveals acute discitis/osteomyelitis at L5-S1 level with ventral epidural enhancement representing phlegmon with partial effacement of the thecal sac and potential compression of traversing S1 nerve roots.  Cardiology Data  . Echocardiogram 05/19/2019 demonstrated severe tricuspid regurgitation and abnormalities suspicious for valvular vegetation due to endocarditis. . TEE: 05/23/19 Tricuspid valve endocarditis with MRSA bacteremia, with likely paradoxical septic embolization (renal septic emboli).  Scheduled Meds: . enoxaparin (LOVENOX) injection  40 mg Subcutaneous Q24H  . ferrous sulfate  325 mg Oral BID  . magnesium oxide  400 mg Oral BID  . methadone  15 mg Oral Daily  . prenatal multivitamin  1 tablet Oral Daily   Continuous Infusions: . lactated ringers 10 mL/hr at 05/24/19 0902  . vancomycin 1,000 mg (05/29/19 0834)    Principal Problem:   Septic arthritis of shoulder, left (HCC) Active Problems:   Injection of illicit drug within last 12 months   Hepatitis C antibody  positive, s/p spontaneously cleared infection   Sepsis ([place])   Septic pulmonary embolism ([place])   Substance use disorder   Suspected endocarditis   MRSA bacteremia   IV drug abuse ([place])   Septic embolism (HCC)   Staphylococcal arthritis of right shoulder (HCC)   Staphylococcal arthritis of left wrist (HCC)   PFO (patent foramen ovale)   Endocarditis of tricuspid valve   Septic arthritis of shoulder, right (HCC)   LOS: 10 days   Total time spent: 20 minutes

## 2019-05-29 NOTE — Progress Notes (Signed)
Patient educated on the indications to use a Purewick catheter. Patient's Purewick removed. Patient is independent to the bathroom at baseline. Patient instructed to call for assistance in the restroom if she needs it.

## 2019-05-29 NOTE — Progress Notes (Addendum)
OT Cancellation Note  Patient Details Name: Christy Pearson MRN: [id number] DOB: Jan 06, 1991   Cancelled Treatment:     Attempt to see patient in AM however patient sleeping "Can you come back later?" Will re-attempt as time permits.  Re-attempt in PM, patient reports feeling very tired today and declines therapy. Will attempt 2/9 as schedule permits.  Myrtie Neither OT OT office: [phone]   Carmelia Roller 05/29/2019, 11:39 AM

## 2019-05-29 NOTE — Progress Notes (Signed)
[facility]  Date of Admission:  05/18/2019     Total days of antibiotics 12         ASSESSMENT:  Ms. 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 Cultures from surgery with 1/4 growing MRSA. No evidence of nephrotoxicity. She will need prolonged therapy and will need to stay in the hospital given her IVDU.  PLAN:  1. Continue vancomycin 2. Therapeutic drug monitoring of renal function while on vancomycin.  3. Wound care per orthopedics as needed.  4. Substance use disorder per primary team.    Principal Problem:   Septic arthritis of shoulder, left (HCC) Active Problems:   Injection of illicit drug within last 12 months   Hepatitis C antibody positive, s/p spontaneously cleared infection   Sepsis ([place])   Septic pulmonary embolism ([place])   Substance use disorder   Suspected endocarditis   MRSA bacteremia   IV drug abuse ([place])   Septic embolism (HCC)   Staphylococcal arthritis of right shoulder (HCC)   Staphylococcal arthritis of left wrist (HCC)   PFO (patent foramen ovale)   Endocarditis of tricuspid valve   Septic arthritis of shoulder, right ([place])   . enoxaparin (LOVENOX) injection  40 mg Subcutaneous Q24H  . ferrous sulfate  325 mg Oral BID  . magnesium oxide  400 mg Oral BID  . methadone  15 mg Oral Daily  . prenatal multivitamin  1 tablet Oral Daily    SUBJECTIVE:  Afebrile overnight with no acute events. Sleeping upon entry. Arousable and everything is okay and wants to go back to sleep.   Allergies  Allergen Reactions  . Tylenol [Acetaminophen] Rash    Pt just reported tylenol allergy at this visit at 2338pm     Review of Systems: Review of Systems  Constitutional: Negative for chills, fever and weight loss.  Respiratory: Negative for cough, shortness of  breath and wheezing.   Cardiovascular: Negative for chest pain and leg swelling.  Gastrointestinal: Negative for abdominal pain, constipation, diarrhea, nausea and vomiting.  Skin: Negative for rash.      OBJECTIVE: Vitals:   05/27/19 2135 05/28/19 0502 05/28/19 2211 05/29/19 0446  BP: 113/77 116/87 98/72 105/79  Pulse: 65 77 71 65  Resp: 19 19 18 18   Temp: 97.9 F (36.6 C) 99.6 F (37.6 C) 97.6 F (36.4 C)   TempSrc: Oral Oral Oral   SpO2: 99% 99% 98% 99%  Weight:      Height:       Body mass index is 19.5 kg/m.  Physical Exam Constitutional:      General: She is sleeping. She is not in acute distress.    Appearance: She is well-developed.     Comments: Lying in bed sleeping on entry; arousable.   Cardiovascular:     Rate and Rhythm: Normal rate and regular rhythm.     Heart sounds: Normal heart sounds.  Pulmonary:     Effort: Pulmonary effort is normal.     Breath sounds: Normal breath sounds.  Skin:    General: Skin is warm and dry.  Neurological:     Mental Status: She is oriented to person, place, and time.  Psychiatric:        Behavior: Behavior normal.        Thought Content: Thought content normal.        Judgment: Judgment  normal.     Lab Results Lab Results  Component Value Date   WBC 6.7 05/28/2019   HGB 8.0 (L) 05/28/2019   HCT 26.1 (L) 05/28/2019   MCV 82.9 05/28/2019   PLT 393 05/28/2019    Lab Results  Component Value Date   CREATININE 0.61 05/28/2019   BUN 5 (L) 05/28/2019   NA 137 05/28/2019   K 3.8 05/28/2019   CL 103 05/28/2019   CO2 24 05/28/2019    Lab Results  Component Value Date   ALT 65 (H) 05/28/2019   AST 37 05/28/2019   ALKPHOS 134 (H) 05/28/2019   BILITOT 0.6 05/28/2019     Microbiology: Recent Results (from the past 240 hour(s))  Surgical pcr screen     Status: None   Collection Time: 05/25/19  2:07 PM   Specimen: Nasal Mucosa; Nasal Swab  Result Value Ref Range Status   MRSA, PCR NEGATIVE NEGATIVE Final    Staphylococcus aureus NEGATIVE NEGATIVE Final    Comment: (NOTE) The Xpert SA Assay (FDA approved for NASAL specimens in patients 26 years of age and older), is one component of a comprehensive surveillance program. It is not intended to diagnose infection nor to guide or monitor treatment. Performed at [facility], [address], [place], [place] [address]   Aerobic/Anaerobic Culture (surgical/deep wound)     Status: None (Preliminary result)   Collection Time: 05/25/19 10:08 PM   Specimen: Other Source; Body Fluid  Result Value Ref Range Status   Specimen Description FLUID LEFT SHOULDER  Final   Special Requests PATIENT ON FOLLOWING VANC  Final   Gram Stain NO WBC SEEN NO ORGANISMS SEEN   Final   Culture   Final    NO GROWTH 3 DAYS NO ANAEROBES ISOLATED; CULTURE IN PROGRESS FOR 5 DAYS Performed at [facility], [address] [place], [place] [address]    Report Status PENDING  Incomplete  Aerobic/Anaerobic Culture (surgical/deep wound)     Status: None (Preliminary result)   Collection Time: 05/25/19 10:08 PM   Specimen: Other Source; Body Fluid  Result Value Ref Range Status   Specimen Description FLUID LEFT SHOULDER SUBACROMIAL  Final   Special Requests PATIENT ON FOLLOWING VANC  Final   Gram Stain   Final    RARE WBC PRESENT, PREDOMINANTLY MONONUCLEAR NO ORGANISMS SEEN    Culture   Final    NO GROWTH 3 DAYS NO ANAEROBES ISOLATED; CULTURE IN PROGRESS FOR 5 DAYS Performed at [facility], [address], [place], [place] [address]    Report Status PENDING  Incomplete  Aerobic/Anaerobic Culture (surgical/deep wound)     Status: None (Preliminary result)   Collection Time: 05/25/19 10:08 PM   Specimen: Other Source; Body Fluid  Result Value Ref Range Status   Specimen Description FLUID RIGHT SHOULDER  Final   Special Requests PATIENT ON FOLLOWING VANC  Final   Gram Stain   Final    ABUNDANT WBC PRESENT, PREDOMINANTLY PMN NO ORGANISMS  SEEN Performed at [facility], [address], [place], [place] [address]    Culture   Final    RARE METHICILLIN RESISTANT STAPHYLOCOCCUS AUREUS CRITICAL RESULT CALLED TO, READ BACK BY AND VERIFIED WITH: RN J HAMZE [id number] AT 1328 BY CM NO ANAEROBES ISOLATED; CULTURE IN PROGRESS FOR 5 DAYS    Report Status PENDING  Incomplete   Organism ID, Bacteria METHICILLIN RESISTANT STAPHYLOCOCCUS AUREUS  Final      Susceptibility  Methicillin resistant staphylococcus aureus - MIC*    CIPROFLOXACIN <=0.5 SENSITIVE Sensitive     ERYTHROMYCIN >=8 RESISTANT Resistant     GENTAMICIN <=0.5 SENSITIVE Sensitive     OXACILLIN >=4 RESISTANT Resistant     TETRACYCLINE <=1 SENSITIVE Sensitive     VANCOMYCIN 1 SENSITIVE Sensitive     TRIMETH/SULFA <=10 SENSITIVE Sensitive     CLINDAMYCIN >=8 RESISTANT Resistant     RIFAMPIN <=0.5 SENSITIVE Sensitive     Inducible Clindamycin NEGATIVE Sensitive     * RARE METHICILLIN RESISTANT STAPHYLOCOCCUS AUREUS  Aerobic/Anaerobic Culture (surgical/deep wound)     Status: None (Preliminary result)   Collection Time: 05/25/19 10:27 PM   Specimen: Other Source; Body Fluid  Result Value Ref Range Status   Specimen Description FLUID RIGHT SHOULDER SUBACROMIAL  Final   Special Requests PATIENT ON FOLLOWING VANC  Final   Gram Stain   Final    RARE WBC PRESENT,BOTH PMN AND MONONUCLEAR NO ORGANISMS SEEN    Culture   Final    NO GROWTH 3 DAYS NO ANAEROBES ISOLATED; CULTURE IN PROGRESS FOR 5 DAYS Performed at [facility], [address], [place], [place] [address]    Report Status PENDING  Incomplete     Marcos Eke, NP [facility] [phone] Pager  05/29/2019  9:54 AM

## 2019-05-29 NOTE — Progress Notes (Signed)
     Subjective: Post-op Day 4: bilateral shoulder arthroscopic irrigation and debridement Patient seated up in chair. Denies pain in wrists. Denies pain in right shoulder. States left shoulder pain is severe because "I slept on it wrong".   Objective:   VITALS:   Vitals:   05/27/19 2135 05/28/19 0502 05/28/19 2211 05/29/19 0446  BP: 113/77 116/87 98/72 105/79  Pulse: 65 77 71 65  Resp: 19 19 18 18   Temp: 97.9 F (36.6 C) 99.6 F (37.6 C) 97.6 F (36.4 C)   TempSrc: Oral Oral Oral   SpO2: 99% 99% 98% 99%  Weight:      Height:        ABD soft Neurovascular intact Sensation intact distally Intact pulses distally Patient reports dressing changed yesterday. She would not allow me to look at dressings today. She refused to perform active ROM of shoulders for me on exam.   Lab Results  Component Value Date   WBC 6.7 05/28/2019   HGB 8.0 (L) 05/28/2019   HCT 26.1 (L) 05/28/2019   MCV 82.9 05/28/2019   PLT 393 05/28/2019   BMET    Component Value Date/Time   NA 137 05/28/2019 0934   K 3.8 05/28/2019 0934   CL 103 05/28/2019 0934   CO2 24 05/28/2019 0934   GLUCOSE 113 (H) 05/28/2019 0934   BUN 5 (L) 05/28/2019 0934   CREATININE 0.61 05/28/2019 0934   CALCIUM 8.3 (L) 05/28/2019 0934   GFRNONAA >60 05/28/2019 0934   GFRAA >60 05/28/2019 0934     Assessment/Plan: 4 Days Post-Op   Principal Problem:   Septic arthritis of shoulder, left (HCC) Active Problems:   Injection of illicit drug within last 12 months   Hepatitis C antibody positive, s/p spontaneously cleared infection   Sepsis (HCC)   Septic pulmonary embolism (HCC)   Substance use disorder   Suspected endocarditis   MRSA bacteremia   IV drug abuse (HCC)   Septic embolism (HCC)   Staphylococcal arthritis of right shoulder (HCC)   Staphylococcal arthritis of left wrist (HCC)   PFO (patent foramen ovale)   Endocarditis of tricuspid valve   Septic arthritis of shoulder, right (HCC)  Post-op Day 4:  bilateral shoulder arthroscopic irrigation and debridement - pain improving, left worse than right - patient can continue to apply heating pads to shoulders as needed - continue OT, encouraged patient to engaged in therapy - watching operative cultures from bilateral shoulders, no growth yet   07/26/2019 05/29/2019, 4:10 PM   07/27/2019, MD Cell [phone]

## 2019-05-30 DIAGNOSIS — M00012 Staphylococcal arthritis, left shoulder: Secondary | ICD-10-CM | POA: Diagnosis not present

## 2019-05-30 DIAGNOSIS — M00011 Staphylococcal arthritis, right shoulder: Secondary | ICD-10-CM | POA: Diagnosis not present

## 2019-05-30 DIAGNOSIS — I330 Acute and subacute infective endocarditis: Secondary | ICD-10-CM | POA: Diagnosis not present

## 2019-05-30 DIAGNOSIS — R7881 Bacteremia: Secondary | ICD-10-CM | POA: Diagnosis not present

## 2019-05-30 LAB — AEROBIC/ANAEROBIC CULTURE W GRAM STAIN (SURGICAL/DEEP WOUND)
Culture: NO GROWTH
Culture: NO GROWTH
Culture: NO GROWTH
Gram Stain: NONE SEEN

## 2019-05-30 LAB — VANCOMYCIN, TROUGH: Vancomycin Tr: 16 ug/mL (ref 15–20)

## 2019-05-30 LAB — VANCOMYCIN, PEAK: Vancomycin Pk: 37 ug/mL (ref 30–40)

## 2019-05-30 MED ORDER — VANCOMYCIN HCL 750 MG/150ML IV SOLN
750.0000 mg | Freq: Two times a day (BID) | INTRAVENOUS | Status: DC
  Filled 2019-05-30: qty 150

## 2019-05-30 MED ORDER — LACTATED RINGERS IV BOLUS
500.0000 mL | Freq: Once | INTRAVENOUS | Status: AC
  Administered 2019-05-30: 22:00:00 500 mL via INTRAVENOUS

## 2019-05-30 MED ORDER — HYDROXYZINE HCL 25 MG PO TABS
25.0000 mg | ORAL_TABLET | Freq: Three times a day (TID) | ORAL | Status: AC | PRN
  Administered 2019-05-30: 22:00:00 25 mg via ORAL
  Filled 2019-05-30: qty 1

## 2019-05-30 NOTE — Progress Notes (Signed)
     Subjective: Post-op Day 4: bilateral shoulder arthroscopic irrigation and debridement Patient reports pain in left shoulder to be moderate. Reports no pain in right shoulder today.   Objective:   VITALS:   Vitals:   05/28/19 2211 05/29/19 0446 05/29/19 2049 05/30/19 0539  BP: 98/72 105/79 100/72 110/72  Pulse: 71 65 100 98  Resp: 18 18 18 16   Temp: 97.6 F (36.4 C)  98.5 F (36.9 C) 97.8 F (36.6 C)  TempSrc: Oral  Oral Oral  SpO2: 98% 99% 100% 99%  Weight:      Height:       Patient is laying comfortably in bed on right side. In no acute distress. ABD soft Neurovascular intact Sensation intact distally Intact pulses distally Patient would not allow me to examine shoulders though by report she is able to move right shoulder fully. Patient states no change in shoulder ROM overnight.  Lab Results  Component Value Date   WBC 6.7 05/28/2019   HGB 8.0 (L) 05/28/2019   HCT 26.1 (L) 05/28/2019   MCV 82.9 05/28/2019   PLT 393 05/28/2019   BMET    Component Value Date/Time   NA 137 05/28/2019 0934   K 3.8 05/28/2019 0934   CL 103 05/28/2019 0934   CO2 24 05/28/2019 0934   GLUCOSE 113 (H) 05/28/2019 0934   BUN 5 (L) 05/28/2019 0934   CREATININE 0.61 05/28/2019 0934   CALCIUM 8.3 (L) 05/28/2019 0934   GFRNONAA >60 05/28/2019 0934   GFRAA >60 05/28/2019 0934     Assessment/Plan: 5 Days Post-Op   Principal Problem:   Septic arthritis of shoulder, left (HCC) Active Problems:   Injection of illicit drug within last 12 months   Hepatitis C antibody positive, s/p spontaneously cleared infection   Sepsis (HCC)   Septic pulmonary embolism (HCC)   Substance use disorder   Suspected endocarditis   MRSA bacteremia   IV drug abuse (HCC)   Septic embolism (HCC)   Staphylococcal arthritis of right shoulder (HCC)   Staphylococcal arthritis of left wrist (HCC)   PFO (patent foramen ovale)   Endocarditis of tricuspid valve   Septic arthritis of shoulder, right (HCC)    Elevated LFTs  Post-op Day 5: bilateral shoulder arthroscopic irrigation and debridement - pain improving - patient can continue to apply heating pads to shoulders as needed - continue PRN dressing changes - continue OT, encouraged patient to engaged in therapy - watching operative cultures from bilateral shoulders, no growth yet though growth is unlikely because cultures were taken after antibiotic start    07/26/2019 05/30/2019, 8:46 AM   07/28/2019, MD Cell [phone]

## 2019-05-30 NOTE — Significant Event (Signed)
Rapid Response Event Note  Overview: Called d/t RED MEWS-4 for T-102.6 and HR-127. Pt here with sepsis. Per RN, pt says she is very anxious and requesting medication. Ibuprofen 600mg  and Flexeril 10mg  given @2032 . RN notified NP of MEWS and anxiety-500cc LR bolus and atarax 25mg  ordered. Please call RRT if assistance needed.   

## 2019-05-30 NOTE — Progress Notes (Signed)
Pharmacy Antibiotic Note  Christy Pearson is a 29 y.o. female admitted on 05/18/2019 with MRSA bacteremia and TV endocarditis. Blood culture on 1/28 was positive for MRSA and repeat blood cultures on 1/29 are no growth to date. TEE found one 0.5cm vegetations on at least 2 leaflets of TV.  Pharmacy has been consulted for vancomycin dosing for endocarditis. Patient has been receiving vancomycin 1000mg  IV q12h.    Of note, MRI of right shoulder found moderate to large glenohumeral joint effusion with synovitis and findings suspicious for septic arthritis in setting of known systemic infection. MRI of left shoulder found small joint effusion suspicious for septic arthritis and concerning of osteomyelitis. MRI of left wrist found midcarpal joint effusion concerning for possible septic arthritis. Bilateral should arthroscopy was performed on 2/4. MRI spine found possible phlegmon at L5-S1 and presumed discitis/osteomyelitis. Neurosurgery consulted.   Vancomycin levels obtained on 2/9. Vancomycin trough was 16 and vancomycin peak was 37. Calculated AUC is 638 which is supratherapeutic (goal AUC is 400-550). Patient was previously subtherapeutic on this regimen but now is likely accumulating vancomycin. Scr is stable. WBC wnl. Afebrile.   Plan: Decrease vancomycin to 750mg  IV q12h  Obtain vancomycin levels as appropriate Monitor renal function, cultures/sensitivites, and clinical progression    Height: 5' 7.01" (170.2 cm) Weight: 124 lb 9 oz (56.5 kg) IBW/kg (Calculated) : 61.62  Temp (24hrs), Avg:98.2 F (36.8 C), Min:97.8 F (36.6 C), Max:98.5 F (36.9 C)  Recent Labs  Lab 05/23/19 1006 05/23/19 1412 05/24/19 0224 05/28/19 0934 05/30/19 0039  WBC  --   --  7.9 6.7  --   CREATININE  --   --  0.53 0.61  --   VANCOTROUGH 9*  --   --   --   --   VANCOPEAK  --  29*  --   --  37    Estimated Creatinine Clearance: 95 mL/min (by C-G formula based on SCr of 0.61 mg/dL).    Allergies  Allergen  Reactions  . Tylenol [Acetaminophen] Rash    Pt just reported tylenol allergy at this visit at 2338pm    Antimicrobials this admission: Vancomycin 1/29 >>  CTX 1/30 >> 2/1 Cefepime 1/29 x3 doses  Dose adjustments this admission: 1/31: VP 29, VT 9, AUC 366 - increased dose from 750mg  IV q12h to 1000 mg q12h 2/9: VP 37, VT 16, AUC 638 - decreased from 1000mg  IV q12h to 750mg  IV q12h  Microbiology results: 1/28 BCx: MRSA 1/28 UCx: Ecoli (UA contaminated)  1/29 resp panel: neg 1/29 BCx: ngtd 2/4 R shoulder fluid: no organisms 2/4 L shoulder fluid: no organisms  , PharmD PGY1 Pharmacy Resident [employer]: [phone]  05/30/2019 9:44 AM

## 2019-05-30 NOTE — Progress Notes (Signed)
PROGRESS NOTE  Christy Pearson OEU:[id number] DOB: September 24, 1990 DOA: 05/18/2019 PCP: Patient, No Pcp Per  Brief History   The patient is a 29 yr old woman with a medical history significant for hepatitis C, IV heroin, crystal meth abuse, and kidney stone presented to the emergency room at [facility] with about 1 week of right flank pain.  She was found to have cavitary pulmonary lesions with septic emboli.  Blood cultures with MRSA. 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  Assessment and Plan # MRSA bacteremia with severe sepsis, tricuspid valve endocarditis, multilevel vertebral osteomyelitis and septic pulmonary emboli and bilateral septic shoulder arthritis: Sepsis improved.   Repeat blood cultures negative from 1/29. On IV vancomycin and followed by infectious disease.   She will need prolonged antibiotic therapy and will need to stay in the hospital as she is not outpatient therapy candidate.   Underwent arthrocentesis and debridement, 05/25/2019.  Followed by orthopedic surgery.  No growth so far.  Weightbearing and mobility.  Suspected right wrist septic arthritis: Clinically improved.    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  Neurosurgery has seen the patient.   Abnormal LFTs: From sepsis.  Liver ultrasound was benign.  Normalized.  E. coli UTI: Treated with ceftriaxone and finished therapy.  Anemia of chronic disease: 1 unit blood transfusion.  1 unit IV iron given.  Hemoglobin 8.  Polysubstance abuse/IV drug use: Counseled.  On methadone.  Stable.   DVT prophylaxis:  lovenox Code Status: Full Code Family Communication: None available Disposition Plan: Patient from home.  on active treatment for MRSA bacteremia and endocarditis. She is an active IV drug user, she is not safe for discharge to use outpatient antibiotic therapy.  Anticipate she will stay in the hospital for some time to finish her  antibiotic therapy.  Consultants  . Infectious disease . Cardiology . Orthopedics Mardelle Matte)  Procedures  . Transfusion of one unit of PRBC's.  Antibiotics   Anti-infectives (From admission, onward)   Start     Dose/Rate Route Frequency Ordered Stop   05/25/19 1230  amoxicillin (AMOXIL) capsule 500 mg     500 mg Oral  Once 05/25/19 1120 05/25/19 1435   05/21/19 1000  vancomycin (VANCOCIN) IVPB 1000 mg/200 mL premix     1,000 mg 200 mL/hr over 60 Minutes Intravenous Every 12 hours 05/21/19 0948     05/20/19 1400  cefTRIAXone (ROCEPHIN) 2 g in sodium chloride 0.9 % 100 mL IVPB  Status:  Discontinued     2 g 200 mL/hr over 30 Minutes Intravenous Every 24 hours 05/20/19 1328 05/22/19 0947   05/19/19 2200  levofloxacin (LEVAQUIN) IVPB 750 mg  Status:  Discontinued     750 mg 100 mL/hr over 90 Minutes Intravenous Every 24 hours 05/19/19 0212 05/19/19 0213   05/19/19 2200  levofloxacin (LEVAQUIN) IVPB 500 mg  Status:  Discontinued     500 mg 100 mL/hr over 60 Minutes Intravenous Every 24 hours 05/19/19 0213 05/19/19 0539   05/19/19 1000  vancomycin (VANCOREADY) IVPB 750 mg/150 mL  Status:  Discontinued     750 mg 150 mL/hr over 60 Minutes Intravenous Every 12 hours 05/19/19 0539 05/21/19 0948   05/19/19 0600  ceFEPIme (MAXIPIME) 2 g in sodium chloride 0.9 % 100 mL IVPB  Status:  Discontinued     2 g 200 mL/hr over 30 Minutes Intravenous Every 8 hours 05/19/19 0539 05/19/19 2348   05/19/19 0130  vancomycin (VANCOCIN) IVPB 1000  mg/200 mL premix     1,000 mg 200 mL/hr over 60 Minutes Intravenous  Once 05/19/19 0122 05/19/19 0253   05/18/19 2345  levofloxacin (LEVAQUIN) IVPB 750 mg     750 mg 100 mL/hr over 90 Minutes Intravenous  Once 05/18/19 2338 05/19/19 0140     Subjective  No overnight events.  Pain controlled.  She is not very keen to conversation and not very keen to mobility whenever I went to examine her. Objective   Vitals:  Vitals:   05/29/19 2049 05/30/19 0539  BP:  100/72 110/72  Pulse: 100 98  Resp: 18 16  Temp: 98.5 F (36.9 C) 97.8 F (36.6 C)  SpO2: 100% 99%   Exam:  Gen: awake, alert, NAD. Chronically sick looking.  Not in any distress. HEENT: NCAT. Neck supple.  No JVD, no icterus Lungs: BBS, CTAB CVS: normal S1-S2, regular rhythm,  Abd: soft, Non tender, non distended, BS present Extremities: Bilateral shoulders with surgical dressing.  Distal neurovascular status intact.  Wrist joint is normal with no pain.  I have personally reviewed the following:   Today's Data  . T Max 24, Vitals, CMP, CBC  Micro Data  . Blood cultures x 2 from 05/18/2019 MRSA . Blood cultures x 2 from 05/18/2019 - No growth . Urine Culture 05/18/2019 - E. coli  Imaging  . CT renal stone study: Multiple cavitary and non-cavitary pulmonary lesions consistent with septic emboli. . MRI MRI of shoulders reveal large joint effusion indicating at least septic arthritis. MRI of spine also reveals acute discitis/osteomyelitis at L5-S1 level with ventral epidural enhancement representing phlegmon with partial effacement of the thecal sac and potential compression of traversing S1 nerve roots.  Cardiology Data  . Echocardiogram 05/19/2019 demonstrated severe tricuspid regurgitation and abnormalities suspicious for valvular vegetation due to endocarditis. . TEE: 05/23/19 Tricuspid valve endocarditis with MRSA bacteremia, with likely paradoxical septic embolization (renal septic emboli).  Scheduled Meds: . enoxaparin (LOVENOX) injection  40 mg Subcutaneous Q24H  . ferrous sulfate  325 mg Oral BID  . magnesium oxide  400 mg Oral BID  . methadone  15 mg Oral Daily  . prenatal multivitamin  1 tablet Oral Daily   Continuous Infusions: . lactated ringers 10 mL/hr at 05/24/19 0902  . vancomycin 1,000 mg (05/30/19 0936)    Principal Problem:   Septic arthritis of shoulder, left (HCC) Active Problems:   Injection of illicit drug within last 12 months   Hepatitis C antibody  positive, s/p spontaneously cleared infection   Sepsis (HCC)   Septic pulmonary embolism (HCC)   Substance use disorder   Suspected endocarditis   MRSA bacteremia   IV drug abuse (HCC)   Septic embolism (HCC)   Staphylococcal arthritis of right shoulder (HCC)   Staphylococcal arthritis of left wrist (HCC)   PFO (patent foramen ovale)   Endocarditis of tricuspid valve   Septic arthritis of shoulder, right (HCC)   Elevated LFTs   LOS: 11 days   Total time spent: 20 minutes

## 2019-05-30 NOTE — Plan of Care (Signed)
  Problem: Activity: Goal: Risk for activity intolerance will decrease Outcome: Progressing   Problem: Nutrition: Goal: Adequate nutrition will be maintained Outcome: Progressing   Problem: Elimination: Goal: Will not experience complications related to bowel motility Outcome: Progressing   Problem: Pain Managment: Goal: General experience of comfort will improve Outcome: Progressing   

## 2019-05-30 NOTE — Progress Notes (Signed)
Occupational Therapy Treatment Patient Details Name: Christy Pearson MRN: [id number] DOB: Feb 15, 1991 Today's Date: 05/30/2019    History of present illness 29 yo female admitted with fevere chills and back pain. 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 PMH hepatitis C, Kidney stones, IV drug abuse,    OT comments  Pt seen for continued B UE ROM and encouraging use of UE for functional tasks. RN verbalized pt is not taking herself to bathroom within room with supervision. Patient confirms this and reports it is difficult to do hygiene but she is able with increased time for thoroughness. Pt declines OOB activities again this session. She reports, " I have more pain in the L now because I pulled a muscle". Pt performing AROM for shoulder in all planes of movement x 5 reps each. Pt requesting drink and snack. She was able to reach out laterally to pull table to bed and across herself as well as opening pepsi soda, obtaining crackers, and spreading cheese onto them without issue. OT continues to encourage pt to use UEs in functional ways as much as possible. Pt continues to benefit from OT intervention.   Follow Up Recommendations  No OT follow up;Follow surgeon's recommendation for DC plan and follow-up therapies    Equipment Recommendations  3 in 1 bedside commode       Precautions / Restrictions Precautions Precautions: Fall Restrictions Weight Bearing Restrictions: No RUE Weight Bearing: Weight bearing as tolerated LUE Weight Bearing: Weight bearing as tolerated              ADL either performed or assessed with clinical judgement   ADL Overall ADL's : Needs assistance/impaired Eating/Feeding: Independent;Sitting                     Cognition Arousal/Alertness: Awake/alert Behavior During Therapy: WFL for tasks  assessed/performed Overall Cognitive Status: Within Functional Limits for tasks assessed        General Comments: self limiting                   Pertinent Vitals/ Pain       Pain Assessment: Faces Faces Pain Scale: Hurts even more Pain Location: B shoulders (R>L) Pain Descriptors / Indicators: Guarding;Sore Pain Intervention(s): Limited activity within patient's tolerance;Monitored during session         Frequency  Min 3X/week        Progress Toward Goals  OT Goals(current goals can now be found in the care plan section)  Progress towards OT goals: Progressing toward goals  Acute Rehab OT Goals Patient Stated Goal: less pain and to move my arms more OT Goal Formulation: With patient Time For Goal Achievement: 06/13/19  Plan Discharge plan remains appropriate       AM-PAC OT "6 Clicks" Daily Activity     Outcome Measure     Help from another person taking care of personal grooming?: A Little Help from another person toileting, which includes using toliet, bedpan, or urinal?: A Little Help from another person bathing (including washing, rinsing, drying)?: A Little Help from another person to put on and taking off regular upper body clothing?: A Little Help from another person to put on and taking off regular lower body clothing?: A Little 6 Click Score: 15    End of Session    OT Visit Diagnosis: Unsteadiness on feet (R26.81);Muscle weakness (generalized) (M62.81);Pain Pain -  Right/Left: Right Pain - part of body: Shoulder   Activity Tolerance Patient tolerated treatment well   Patient Left in bed;with call bell/phone within reach   Nurse Communication Mobility status;Precautions        Time: 3491-7915 OT Time Calculation (min): 15 min  Charges: OT Treatments $Therapeutic Activity: 8-22 mins   Jackquline Denmark P MS, OTR/L 05/30/2019, 4:00 PM

## 2019-05-30 NOTE — Progress Notes (Addendum)
Subjective: No new complaints   Antibiotics:  Anti-infectives (From admission, onward)   Start     Dose/Rate Route Frequency Ordered Stop   05/30/19 2200  vancomycin (VANCOREADY) IVPB 750 mg/150 mL     750 mg 150 mL/hr over 60 Minutes Intravenous Every 12 hours 05/30/19 1207     05/25/19 1230  amoxicillin (AMOXIL) capsule 500 mg     500 mg Oral  Once 05/25/19 1120 05/25/19 1435   05/21/19 1000  vancomycin (VANCOCIN) IVPB 1000 mg/200 mL premix  Status:  Discontinued     1,000 mg 200 mL/hr over 60 Minutes Intravenous Every 12 hours 05/21/19 0948 05/30/19 1207   05/20/19 1400  cefTRIAXone (ROCEPHIN) 2 g in sodium chloride 0.9 % 100 mL IVPB  Status:  Discontinued     2 g 200 mL/hr over 30 Minutes Intravenous Every 24 hours 05/20/19 1328 05/22/19 0947   05/19/19 2200  levofloxacin (LEVAQUIN) IVPB 750 mg  Status:  Discontinued     750 mg 100 mL/hr over 90 Minutes Intravenous Every 24 hours 05/19/19 0212 05/19/19 0213   05/19/19 2200  levofloxacin (LEVAQUIN) IVPB 500 mg  Status:  Discontinued     500 mg 100 mL/hr over 60 Minutes Intravenous Every 24 hours 05/19/19 0213 05/19/19 0539   05/19/19 1000  vancomycin (VANCOREADY) IVPB 750 mg/150 mL  Status:  Discontinued     750 mg 150 mL/hr over 60 Minutes Intravenous Every 12 hours 05/19/19 0539 05/21/19 0948   05/19/19 0600  ceFEPIme (MAXIPIME) 2 g in sodium chloride 0.9 % 100 mL IVPB  Status:  Discontinued     2 g 200 mL/hr over 30 Minutes Intravenous Every 8 hours 05/19/19 0539 05/19/19 2348   05/19/19 0130  vancomycin (VANCOCIN) IVPB 1000 mg/200 mL premix     1,000 mg 200 mL/hr over 60 Minutes Intravenous  Once 05/19/19 0122 05/19/19 0253   05/18/19 2345  levofloxacin (LEVAQUIN) IVPB 750 mg     750 mg 100 mL/hr over 90 Minutes Intravenous  Once 05/18/19 2338 05/19/19 0140      Medications: Scheduled Meds: . enoxaparin (LOVENOX) injection  40 mg Subcutaneous Q24H  . ferrous sulfate  325 mg Oral BID  . magnesium oxide   400 mg Oral BID  . methadone  15 mg Oral Daily  . prenatal multivitamin  1 tablet Oral Daily   Continuous Infusions: . lactated ringers 10 mL/hr at 05/24/19 0902  . vancomycin     PRN Meds:.cyclobenzaprine, ibuprofen, ondansetron **OR** ondansetron (ZOFRAN) IV    Objective: Weight change:   Intake/Output Summary (Last 24 hours) at 05/30/2019 1438 Last data filed at 05/30/2019 1030 Gross per 24 hour  Intake 816 ml  Output 1300 ml  Net -484 ml   Blood pressure 110/72, pulse 98, temperature 97.8 F (36.6 C), temperature source Oral, resp. rate 16, height 5' 7.01" (1.702 m), weight 56.5 kg, last menstrual period 04/27/2019, SpO2 99 %, unknown if currently breastfeeding. Temp:  [97.8 F (36.6 C)-98.5 F (36.9 C)] 97.8 F (36.6 C) (02/09 0539) Pulse Rate:  [98-100] 98 (02/09 0539) Resp:  [16-18] 16 (02/09 0539) BP: (100-110)/(72) 110/72 (02/09 0539) SpO2:  [99 %-100 %] 99 % (02/09 0539)  Physical Exam: General: Alert and awake, oriented x3, not in any acute distress. HEENT: anicteric sclera, EOMI CVS regular rate, normal  Chest: , no wheezing, no respiratory distress Abdomen: soft non-distended,  Extremities: bilaterally dresssed shoulders less tenderness in left wrist Skin: no rashes Neuro:  nonfocal  CBC:    BMET Recent Labs    05/28/19 0934  NA 137  K 3.8  CL 103  CO2 24  GLUCOSE 113*  BUN 5*  CREATININE 0.61  CALCIUM 8.3*     Liver Panel  Recent Labs    05/28/19 0934  PROT 6.0*  ALBUMIN 1.9*  AST 37  ALT 65*  ALKPHOS 134*  BILITOT 0.6       Sedimentation Rate No results for input(s): ESRSEDRATE in the last 72 hours. C-Reactive Protein No results for input(s): CRP in the last 72 hours.  Micro Results: Recent Results (from the past 720 hour(s))  Blood Culture (routine x 2)     Status: Abnormal   Collection Time: 05/18/19 10:52 PM   Specimen: BLOOD  Result Value Ref Range Status   Specimen Description   Final    BLOOD BLOOD LEFT  ARM Performed at [facility], [address] [place], [place] [address]    Special Requests   Final    BOTTLES DRAWN AEROBIC AND ANAEROBIC Blood Culture adequate volume Performed at [facility], [address] [place], [place] [address]    Culture  Setup Time   Final    IN BOTH AEROBIC AND ANAEROBIC BOTTLES GRAM POSITIVE COCCI CRITICAL RESULT CALLED TO, READ BACK BY AND VERIFIED WITH: [facility] 05/19/19 2254 JDW Performed at [facility], [address] [place], [place] [address]    Culture METHICILLIN RESISTANT STAPHYLOCOCCUS AUREUS (A)  Final   Report Status 05/21/2019 FINAL  Final   Organism ID, Bacteria METHICILLIN RESISTANT STAPHYLOCOCCUS AUREUS  Final      Susceptibility   Methicillin resistant staphylococcus aureus - MIC*    CIPROFLOXACIN <=0.5 SENSITIVE Sensitive     ERYTHROMYCIN >=8 RESISTANT Resistant     GENTAMICIN <=0.5 SENSITIVE Sensitive     OXACILLIN >=4 RESISTANT Resistant     TETRACYCLINE <=1 SENSITIVE Sensitive     VANCOMYCIN 1 SENSITIVE Sensitive     TRIMETH/SULFA <=10 SENSITIVE Sensitive     CLINDAMYCIN >=8 RESISTANT Resistant     RIFAMPIN <=0.5 SENSITIVE Sensitive     Inducible Clindamycin NEGATIVE Sensitive     * METHICILLIN RESISTANT STAPHYLOCOCCUS AUREUS  Blood Culture ID Panel (Reflexed)     Status: Abnormal   Collection Time: 05/18/19 10:52 PM  Result Value Ref Range Status   Enterococcus species NOT DETECTED NOT DETECTED Final   Listeria monocytogenes NOT DETECTED NOT DETECTED Final   Staphylococcus species DETECTED (A) NOT DETECTED Final    Comment: CRITICAL RESULT CALLED TO, READ BACK BY AND VERIFIED WITH: G ABBOTT PHARMD 05/19/19 2254 JDW    Staphylococcus aureus (BCID) DETECTED (A) NOT DETECTED Final    Comment: Methicillin (oxacillin)-resistant Staphylococcus aureus (MRSA). MRSA is predictably resistant to beta-lactam antibiotics (except ceftaroline). Preferred therapy is vancomycin unless clinically  contraindicated. Patient requires contact precautions if  hospitalized. CRITICAL RESULT CALLED TO, READ BACK BY AND VERIFIED WITH: G ABBOTT PHARMD 05/19/19 2254 JDW    Methicillin resistance DETECTED (A) NOT DETECTED Final    Comment: CRITICAL RESULT CALLED TO, READ BACK BY AND VERIFIED WITH: G ABBOTT PHARMD 05/19/19 2254 JDW    Streptococcus species NOT DETECTED NOT DETECTED Final   Streptococcus agalactiae NOT DETECTED NOT DETECTED Final   Streptococcus pneumoniae NOT DETECTED NOT DETECTED Final   Streptococcus pyogenes NOT DETECTED NOT DETECTED Final   Acinetobacter baumannii NOT DETECTED NOT DETECTED Final   Enterobacteriaceae species NOT DETECTED NOT  DETECTED Final   Enterobacter cloacae complex NOT DETECTED NOT DETECTED Final   Escherichia coli NOT DETECTED NOT DETECTED Final   Klebsiella oxytoca NOT DETECTED NOT DETECTED Final   Klebsiella pneumoniae NOT DETECTED NOT DETECTED Final   Proteus species NOT DETECTED NOT DETECTED Final   Serratia marcescens NOT DETECTED NOT DETECTED Final   Haemophilus influenzae NOT DETECTED NOT DETECTED Final   Neisseria meningitidis NOT DETECTED NOT DETECTED Final   Pseudomonas aeruginosa NOT DETECTED NOT DETECTED Final   Candida albicans NOT DETECTED NOT DETECTED Final   Candida glabrata NOT DETECTED NOT DETECTED Final   Candida krusei NOT DETECTED NOT DETECTED Final   Candida parapsilosis NOT DETECTED NOT DETECTED Final   Candida tropicalis NOT DETECTED NOT DETECTED Final    Comment: Performed at [facility], [address], [place], [place] [address]  Urine culture     Status: Abnormal   Collection Time: 05/18/19 11:38 PM   Specimen: In/Out Cath Urine  Result Value Ref Range Status   Specimen Description   Final    IN/OUT CATH URINE Performed at [facility], [address] [place], [place] [address]    Special Requests   Final    NONE Performed at [facility], [address] [place], [place] [address]      Culture >=100,000 COLONIES/mL ESCHERICHIA COLI (A)  Final   Report Status 05/21/2019 FINAL  Final   Organism ID, Bacteria ESCHERICHIA COLI (A)  Final      Susceptibility   Escherichia coli - MIC*    AMPICILLIN >=32 RESISTANT Resistant     CEFAZOLIN >=64 RESISTANT Resistant     CEFTRIAXONE 0.5 SENSITIVE Sensitive     CIPROFLOXACIN <=0.25 SENSITIVE Sensitive     GENTAMICIN <=1 SENSITIVE Sensitive     IMIPENEM <=0.25 SENSITIVE Sensitive     NITROFURANTOIN <=16 SENSITIVE Sensitive     TRIMETH/SULFA <=20 SENSITIVE Sensitive     AMPICILLIN/SULBACTAM >=32 RESISTANT Resistant     PIP/TAZO 8 SENSITIVE Sensitive     * >=100,000 COLONIES/mL ESCHERICHIA COLI  SARS CORONAVIRUS 2 (TAT 6-24 HRS) Nasopharyngeal In/Out Cath Urine     Status: None   Collection Time: 05/18/19 11:39 PM   Specimen: In/Out Cath Urine; Nasopharyngeal  Result Value Ref Range Status   SARS Coronavirus 2 NEGATIVE NEGATIVE Final    Comment: (NOTE) SARS-CoV-2 target nucleic acids are NOT DETECTED. The SARS-CoV-2 RNA is generally detectable in upper and lower respiratory specimens during the acute phase of infection. Negative results do not preclude SARS-CoV-2 infection, do not rule out co-infections with other pathogens, and should not be used as the sole basis for treatment or other patient management decisions. Negative results must be combined with clinical observations, patient history, and epidemiological information. The expected result is Negative. Fact Sheet for Patients: HairSlick.no Fact Sheet for Healthcare Providers: quierodirigir.com This test is not yet approved or cleared by the [place] FDA and  has been authorized for detection and/or diagnosis of SARS-CoV-2 by FDA under an Emergency Use Authorization (EUA). This EUA will remain  in effect (meaning this test can be used) for the duration of the COVID-19 declaration under Section 56 4(b)(1) of  the Act, 21 U.S.C. section 360bbb-3(b)(1), unless the authorization is terminated or revoked sooner. Performed at [facility], [address] [place], [place] [address]   SARS Coronavirus 2 Ag (30 min TAT) - In/Out Cath Urine     Status: None   Collection  Time: 05/18/19 11:40 PM   Specimen: In/Out Cath Urine; Nasal Swab  Result Value Ref Range Status   SARS Coronavirus 2 Ag NEGATIVE NEGATIVE Final    Comment: (NOTE) SARS-CoV-2 antigen NOT DETECTED.  Negative results are presumptive.  Negative results do not preclude SARS-CoV-2 infection and should not be used as the sole basis for treatment or other patient management decisions, including infection  control decisions, particularly in the presence of clinical signs and  symptoms consistent with COVID-19, or in those who have been in contact with the virus.  Negative results must be combined with clinical observations, patient history, and epidemiological information. The expected result is Negative. Fact Sheet for Patients: https://sanders-williams.net/ Fact Sheet for Healthcare Providers: https://martinez.com/ This test is not yet approved or cleared by the [place] FDA and  has been authorized for detection and/or diagnosis of SARS-CoV-2 by FDA under an Emergency Use Authorization (EUA).  This EUA will remain in effect (meaning this test can be used) for the duration of  the COVID-19 de claration under Section 564(b)(1) of the Act, 21 U.S.C. section 360bbb-3(b)(1), unless the authorization is terminated or revoked sooner. Performed at [facility], [address] [place], [place] [address]   Blood Culture (routine x 2)     Status: Abnormal   Collection Time: 05/18/19 11:50 PM   Specimen: BLOOD  Result Value Ref Range Status   Specimen Description   Final    BLOOD BLOOD RIGHT ARM Performed at [facility], [address] [place], [place] [address]     Special Requests   Final    BOTTLES DRAWN AEROBIC AND ANAEROBIC Blood Culture adequate volume Performed at [facility], [address] [place], [place] [address]    Culture  Setup Time   Final    IN BOTH AEROBIC AND ANAEROBIC BOTTLES GRAM POSITIVE COCCI CRITICAL RESULT CALLED TO, READ BACK BY AND VERIFIED WITH: G ABBOTT PHARMD 05/19/19 2254 JDW    Culture (A)  Final    STAPHYLOCOCCUS AUREUS SUSCEPTIBILITIES PERFORMED ON PREVIOUS CULTURE WITHIN THE LAST 5 DAYS. Performed at [facility], [address], [place], [place] [address]    Report Status 05/21/2019 FINAL  Final  Culture, blood (single) w Reflex to ID Panel     Status: None   Collection Time: 05/19/19  1:40 AM   Specimen: BLOOD LEFT ARM  Result Value Ref Range Status   Specimen Description   Final    BLOOD LEFT ARM Performed at [facility], [address] [place], [place] [address]    Special Requests   Final    BOTTLES DRAWN AEROBIC AND ANAEROBIC Blood Culture adequate volume Performed at [facility], [address], [place], [place] [address]    Culture   Final    NO GROWTH 5 DAYS Performed at [facility], [address], [place], [place] [address]    Report Status 05/24/2019 FINAL  Final  Respiratory Panel by PCR     Status: None   Collection Time: 05/19/19  5:58 AM   Specimen: Nasopharyngeal Swab; Respiratory  Result Value Ref Range Status   Adenovirus NOT DETECTED NOT DETECTED Final   Coronavirus 229E NOT DETECTED NOT DETECTED Final    Comment: (NOTE) The Coronavirus on the Respiratory Panel, DOES NOT test for the novel  Coronavirus (2019 nCoV)    Coronavirus HKU1 NOT DETECTED NOT DETECTED Final   Coronavirus NL63  NOT DETECTED NOT DETECTED Final   Coronavirus OC43 NOT DETECTED NOT DETECTED Final   Metapneumovirus NOT DETECTED NOT DETECTED Final   Rhinovirus / Enterovirus NOT DETECTED NOT DETECTED Final   Influenza A NOT DETECTED NOT DETECTED Final   Influenza  B NOT DETECTED NOT DETECTED Final   Parainfluenza Virus 1 NOT DETECTED NOT DETECTED Final   Parainfluenza Virus 2 NOT DETECTED NOT DETECTED Final   Parainfluenza Virus 3 NOT DETECTED NOT DETECTED Final   Parainfluenza Virus 4 NOT DETECTED NOT DETECTED Final   Respiratory Syncytial Virus NOT DETECTED NOT DETECTED Final   Bordetella pertussis NOT DETECTED NOT DETECTED Final   Chlamydophila pneumoniae NOT DETECTED NOT DETECTED Final   Mycoplasma pneumoniae NOT DETECTED NOT DETECTED Final    Comment: Performed at [facility], [address] [place], [place] [address]  Culture, blood (routine x 2)     Status: None   Collection Time: 05/19/19  7:15 AM   Specimen: BLOOD  Result Value Ref Range Status   Specimen Description BLOOD LEFT ANTECUBITAL  Final   Special Requests   Final    BOTTLES DRAWN AEROBIC ONLY Blood Culture results may not be optimal due to an inadequate volume of blood received in culture bottles   Culture   Final    NO GROWTH 5 DAYS Performed at [facility], [address] [place], [place] [address]    Report Status 05/24/2019 FINAL  Final  Culture, blood (routine x 2)     Status: None   Collection Time: 05/19/19  7:20 AM   Specimen: BLOOD RIGHT HAND  Result Value Ref Range Status   Specimen Description BLOOD RIGHT HAND  Final   Special Requests   Final    BOTTLES DRAWN AEROBIC ONLY Blood Culture results may not be optimal due to an inadequate volume of blood received in culture bottles   Culture   Final    NO GROWTH 5 DAYS Performed at [facility], [address] [place], [place] [address]    Report Status 05/24/2019 FINAL  Final  Surgical pcr screen     Status: None   Collection Time: 05/25/19  2:07 PM   Specimen: Nasal Mucosa; Nasal Swab  Result Value Ref Range Status   MRSA, PCR NEGATIVE NEGATIVE Final   Staphylococcus aureus NEGATIVE NEGATIVE Final    Comment: (NOTE) The Xpert SA Assay (FDA approved for NASAL specimens in patients  27 years of age and older), is one component of a comprehensive surveillance program. It is not intended to diagnose infection nor to guide or monitor treatment. Performed at [facility], [address] [place], [place] [address]   Aerobic/Anaerobic Culture (surgical/deep wound)     Status: None   Collection Time: 05/25/19 10:08 PM   Specimen: Other Source; Body Fluid  Result Value Ref Range Status   Specimen Description FLUID LEFT SHOULDER  Final   Special Requests PATIENT ON FOLLOWING VANC  Final   Gram Stain NO WBC SEEN NO ORGANISMS SEEN   Final   Culture   Final    No growth aerobically or anaerobically. Performed at [facility], [address] [place], [place] [address]    Report Status 05/30/2019 FINAL  Final  Aerobic/Anaerobic Culture (surgical/deep wound)     Status: None   Collection Time: 05/25/19 10:08 PM   Specimen: Other Source; Body Fluid  Result Value Ref Range Status   Specimen Description FLUID LEFT SHOULDER SUBACROMIAL  Final   Special Requests PATIENT ON FOLLOWING VANC  Final   Gram Stain   Final    RARE WBC PRESENT, PREDOMINANTLY MONONUCLEAR NO ORGANISMS SEEN    Culture   Final    No growth aerobically or anaerobically. Performed at [facility], [address], [place], [place] [address]    Report Status 05/30/2019 FINAL  Final  Aerobic/Anaerobic Culture (surgical/deep wound)     Status: None   Collection Time: 05/25/19 10:08 PM   Specimen: Other Source; Body Fluid  Result Value Ref Range Status   Specimen Description FLUID RIGHT SHOULDER  Final   Special Requests PATIENT ON FOLLOWING VANC  Final   Gram Stain   Final    ABUNDANT WBC PRESENT, PREDOMINANTLY PMN NO ORGANISMS SEEN    Culture   Final    RARE METHICILLIN RESISTANT STAPHYLOCOCCUS AUREUS CRITICAL RESULT CALLED TO, READ BACK BY AND VERIFIED WITH: RN J HAMZE [id number] AT 1328 BY CM NO ANAEROBES ISOLATED Performed at [facility], [address], [place],  [place] [address]    Report Status 05/30/2019 FINAL  Final   Organism ID, Bacteria METHICILLIN RESISTANT STAPHYLOCOCCUS AUREUS  Final      Susceptibility   Methicillin resistant staphylococcus aureus - MIC*    CIPROFLOXACIN <=0.5 SENSITIVE Sensitive     ERYTHROMYCIN >=8 RESISTANT Resistant     GENTAMICIN <=0.5 SENSITIVE Sensitive     OXACILLIN >=4 RESISTANT Resistant     TETRACYCLINE <=1 SENSITIVE Sensitive     VANCOMYCIN 1 SENSITIVE Sensitive     TRIMETH/SULFA <=10 SENSITIVE Sensitive     CLINDAMYCIN >=8 RESISTANT Resistant     RIFAMPIN <=0.5 SENSITIVE Sensitive     Inducible Clindamycin NEGATIVE Sensitive     * RARE METHICILLIN RESISTANT STAPHYLOCOCCUS AUREUS  Aerobic/Anaerobic Culture (surgical/deep wound)     Status: None   Collection Time: 05/25/19 10:27 PM   Specimen: Other Source; Body Fluid  Result Value Ref Range Status   Specimen Description FLUID RIGHT SHOULDER SUBACROMIAL  Final   Special Requests PATIENT ON FOLLOWING VANC  Final   Gram Stain   Final    RARE WBC PRESENT,BOTH PMN AND MONONUCLEAR NO ORGANISMS SEEN    Culture   Final    No growth aerobically or anaerobically. Performed at [facility], [address] [place], [place] [address]    Report Status 05/30/2019 FINAL  Final    Studies/Results: No results found.    Assessment/Plan:  INTERVAL HISTORY:   No new events  Principal Problem:   Septic arthritis of shoulder, left (HCC) Active Problems:   Injection of illicit drug within last 12 months   Hepatitis C antibody positive, s/p spontaneously cleared infection   Sepsis (HCC)   Septic pulmonary embolism (HCC)   Substance use disorder   Suspected endocarditis   MRSA bacteremia   IV drug abuse (HCC)   Septic embolism (HCC)   Staphylococcal arthritis of right shoulder (HCC)   Staphylococcal arthritis of left wrist (HCC)   PFO (patent foramen ovale)   Endocarditis of tricuspid valve   Septic arthritis of shoulder, right (HCC)   Elevated  LFTs    Quetzalli Lafferty is a 29 y.o. 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 Dion Saucier, posssible septic wrist, discitis and osteomyelitis from L5-S1 with ventral epidural enhancement concerning for phlegmon with partial effacement of the thecal sac.  #1  Metastatic MRSA infection  --continue vancomycin  --Need to stay in the hospital to complete effective treatment.  #2  Bilateral septic shoulders: Status post surgery by orthopedics  #3 possible septic left wrist: Ortho hand did not see need to intervene and pain improved  #4  L5-S1 discitis with ventral epidural enhancement and possible phlegmon:  He has no neurological deficits and seen by Neurosurgery   #5 IV drug use: We will need to plan for treatment as an outpatient she was getting methadone in past  We will plan on her getting 6 weeks of IV antibiotics since she cleared her bacteremia and at least 8 weeks of antibiotics post shoulder surgery some of which could be highly bio-available antiobiotics  I will make HSFU fo her with us  We will otherwise sign off  Please call with further questions.    Massie BougieSavannah Aispuro has an appointment on 07/12/2019 with Dr. Daiva EvesVan Dam at 945 AM  [facility] is located in the [facility] at  [address] in [place].  [address], which is located to the left of the elevators.  Phone: [phone]  Fax: [phone]  [email or web address]  She should arrive at least 15 minutes prior to her appointment.     LOS: 11 days   Acey LavCornelius Van Dam 05/30/2019, 2:38 PM

## 2019-05-31 DIAGNOSIS — I079 Rheumatic tricuspid valve disease, unspecified: Secondary | ICD-10-CM | POA: Diagnosis not present

## 2019-05-31 DIAGNOSIS — R768 Other specified abnormal immunological findings in serum: Secondary | ICD-10-CM | POA: Diagnosis not present

## 2019-05-31 DIAGNOSIS — F1910 Other psychoactive substance abuse, uncomplicated: Secondary | ICD-10-CM | POA: Diagnosis not present

## 2019-05-31 DIAGNOSIS — M00012 Staphylococcal arthritis, left shoulder: Secondary | ICD-10-CM | POA: Diagnosis not present

## 2019-05-31 MED ORDER — VANCOMYCIN HCL 750 MG/150ML IV SOLN
750.0000 mg | Freq: Two times a day (BID) | INTRAVENOUS | Status: DC
  Administered 2019-05-31 – 2019-06-03 (×7): 750 mg via INTRAVENOUS
  Filled 2019-05-31 (×9): qty 150

## 2019-05-31 NOTE — Progress Notes (Signed)
PROGRESS NOTE  Christy Pearson GGE:[id number] DOB: 1990/08/20 DOA: 05/18/2019 PCP: Patient, No Pcp Per  Brief History   The patient is a 29 yr old woman with a medical history significant for hepatitis C, IV heroin, crystal meth abuse, and kidney stones presented to the emergency room at [facility] with about 1 week of right flank pain.  She was found to have cavitary pulmonary lesions with septic emboli.  Blood cultures with MRSA. 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 Bilateral shoulder infection/ spinal infection.  Assessment and Plan # MRSA bacteremia with severe sepsis, tricuspid valve endocarditis, multilevel vertebral osteomyelitis and septic pulmonary emboli and bilateral septic shoulder arthritis: Sepsis improved.   Repeat blood cultures negative from 1/29. On IV vancomycin and followed by infectious disease.   She will need prolonged antibiotic therapy and will need to stay in the hospital as she is not outpatient therapy candidate.   Underwent arthrocentesis and debridement, 05/25/2019.  Followed by orthopedic surgery.  No growth so far.  Weightbearing and mobility. Temperature 102 at 2/9 night, none since then.  No blood cultures were drawn. We will put orders to repeat blood cultures if patient has temperature more than 101.  Clinically no evidence of new abscesses.  Suspected right wrist septic arthritis: Clinically improved.    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  Neurosurgery has seen the patient.   Abnormal LFTs: From sepsis.  Liver ultrasound was benign.  Normalized.  E. coli UTI: Treated with ceftriaxone and finished therapy.  Anemia of chronic disease: 1 unit blood transfusion.  1 unit IV iron given.  Hemoglobin 8.  Polysubstance abuse/IV drug use: Counseled.  On methadone.  Stable. Patient wants to come off of all drugs before being discharged.   DVT prophylaxis:  lovenox Code Status:  Full Code Family Communication: Patient communicating with her mom. Disposition Plan: Patient from home.  on active treatment for MRSA bacteremia and endocarditis. She is an active IV drug user, she is not safe for discharge to use outpatient antibiotic therapy.  Anticipate she will stay in the hospital for some time to finish her antibiotic therapy.  Consultants  . Infectious disease . Cardiology . Orthopedics Mardelle Matte)  Procedures  . Transfusion of one unit of PRBC's.  Antibiotics   Anti-infectives (From admission, onward)   Start     Dose/Rate Route Frequency Ordered Stop   05/31/19 0200  vancomycin (VANCOREADY) IVPB 750 mg/150 mL     750 mg 150 mL/hr over 60 Minutes Intravenous Every 12 hours 05/31/19 0126     05/30/19 2200  vancomycin (VANCOREADY) IVPB 750 mg/150 mL  Status:  Discontinued     750 mg 150 mL/hr over 60 Minutes Intravenous Every 12 hours 05/30/19 1207 05/30/19 2126   05/25/19 1230  amoxicillin (AMOXIL) capsule 500 mg     500 mg Oral  Once 05/25/19 1120 05/25/19 1435   05/21/19 1000  vancomycin (VANCOCIN) IVPB 1000 mg/200 mL premix  Status:  Discontinued     1,000 mg 200 mL/hr over 60 Minutes Intravenous Every 12 hours 05/21/19 0948 05/30/19 1207   05/20/19 1400  cefTRIAXone (ROCEPHIN) 2 g in sodium chloride 0.9 % 100 mL IVPB  Status:  Discontinued     2 g 200 mL/hr over 30 Minutes Intravenous Every 24 hours 05/20/19 1328 05/22/19 0947   05/19/19 2200  levofloxacin (LEVAQUIN) IVPB 750 mg  Status:  Discontinued     750 mg 100 mL/hr over 90 Minutes Intravenous  Every 24 hours 05/19/19 0212 05/19/19 0213   05/19/19 2200  levofloxacin (LEVAQUIN) IVPB 500 mg  Status:  Discontinued     500 mg 100 mL/hr over 60 Minutes Intravenous Every 24 hours 05/19/19 0213 05/19/19 0539   05/19/19 1000  vancomycin (VANCOREADY) IVPB 750 mg/150 mL  Status:  Discontinued     750 mg 150 mL/hr over 60 Minutes Intravenous Every 12 hours 05/19/19 0539 05/21/19 0948   05/19/19 0600   ceFEPIme (MAXIPIME) 2 g in sodium chloride 0.9 % 100 mL IVPB  Status:  Discontinued     2 g 200 mL/hr over 30 Minutes Intravenous Every 8 hours 05/19/19 0539 05/19/19 2348   05/19/19 0130  vancomycin (VANCOCIN) IVPB 1000 mg/200 mL premix     1,000 mg 200 mL/hr over 60 Minutes Intravenous  Once 05/19/19 0122 05/19/19 0253   05/18/19 2345  levofloxacin (LEVAQUIN) IVPB 750 mg     750 mg 100 mL/hr over 90 Minutes Intravenous  Once 05/18/19 2338 05/19/19 0140     Subjective  Seen and examined.  Overnight events noted.  Patient had a spiked temperature, however she thinks is because her room was hot and she was wrapped up in blanket.  Given 500 mL fluid bolus.  Now asymptomatic.  No recurrence temperature since then. No blood cultures were drawn. Objective   Vitals:  Vitals:   05/31/19 0547 05/31/19 0937  BP: 97/64 (!) 87/61  Pulse: (!) 113 90  Resp: 17 16  Temp: 98.8 F (37.1 C) 97.7 F (36.5 C)  SpO2: 98% 96%   Exam:  Gen: awake, alert, NAD.  Anxious HEENT: NCAT. Neck supple.  No JVD, no icterus Lungs: BBS, CTAB CVS: normal S1-S2, regular rhythm,  Abd: soft, Non tender, non distended, BS present Extremities: Bilateral shoulders with surgical dressing.  No swelling.  Back without swelling.  Wrist with no swelling or pain.   I have personally reviewed the following:   Today's Data   Vitals with BMI 05/31/2019 05/31/2019 05/31/2019  Height - - -  Weight - - -  BMI - - -  Systolic 87 97 90  Diastolic 61 64 65  Pulse 90 113 112  .   Micro Data  . Blood cultures x 2 from 05/18/2019 MRSA . Blood cultures x 2 from 05/18/2019 - No growth . Urine Culture 05/18/2019 - E. coli  Imaging  . CT renal stone study: Multiple cavitary and non-cavitary pulmonary lesions consistent with septic emboli. . MRI MRI of shoulders reveal large joint effusion indicating at least septic arthritis. MRI of spine also reveals acute discitis/osteomyelitis at L5-S1 level with ventral epidural enhancement  representing phlegmon with partial effacement of the thecal sac and potential compression of traversing S1 nerve roots.  Cardiology Data  . Echocardiogram 05/19/2019 demonstrated severe tricuspid regurgitation and abnormalities suspicious for valvular vegetation due to endocarditis. . TEE: 05/23/19 Tricuspid valve endocarditis with MRSA bacteremia, with likely paradoxical septic embolization (renal septic emboli).  Scheduled Meds: . enoxaparin (LOVENOX) injection  40 mg Subcutaneous Q24H  . ferrous sulfate  325 mg Oral BID  . magnesium oxide  400 mg Oral BID  . methadone  15 mg Oral Daily  . prenatal multivitamin  1 tablet Oral Daily   Continuous Infusions: . lactated ringers 10 mL/hr at 05/24/19 0902  . vancomycin 750 mg (05/31/19 0223)    Principal Problem:   Septic arthritis of shoulder, left (HCC) Active Problems:   Injection of illicit drug within last 12 months  Hepatitis C antibody positive, s/p spontaneously cleared infection   Sepsis ([place])   Septic pulmonary embolism (HCC)   Substance use disorder   Suspected endocarditis   MRSA bacteremia   IV drug abuse ([place])   Septic embolism (HCC)   Staphylococcal arthritis of right shoulder (HCC)   Staphylococcal arthritis of left wrist (HCC)   PFO (patent foramen ovale)   Endocarditis of tricuspid valve   Septic arthritis of shoulder, right (HCC)   Elevated LFTs   LOS: 12 days   Total time spent: 20 minutes

## 2019-05-31 NOTE — Progress Notes (Addendum)
RN paged NP regarding Vancomycin cancelled by Gerrit Halls, RPH per me.  I have not spoken with pharmacy today and called and spoke with Rolan Bucco.  There are no new test results since 05/25/19, and no noted reason to cancel.  She is reinstating vancomycin as previously ordered.  Last peak at 0039 on 05/30/19 and last trough at 0854 on 05/30/19.

## 2019-05-31 NOTE — Progress Notes (Signed)
     Subjective: Post-op Day 6: bilateral shoulder arthroscopic irrigation and debridement Patient reports pain in left shoulder to be mild. Reports no pain in right shoulder today. By patient report, dressings were changed this morning. Denies numbness or tingling in bilateral arms or hands.   Objective:   VITALS:   Vitals:   05/31/19 0106 05/31/19 0155 05/31/19 0547 05/31/19 0937  BP: 93/67 90/65 97/64  (!) 87/61  Pulse: (!) 113 (!) 112 (!) 113 90  Resp: 17 17 17 16   Temp: 98.1 F (36.7 C) 98.1 F (36.7 C) 98.8 F (37.1 C) 97.7 F (36.5 C)  TempSrc: Oral Oral Oral Oral  SpO2: 99% 97% 98% 96%  Weight:      Height:       Patient laying comfortably in bed MSK: Dressings in place on bilateral shoulders. Distal sensation and capillary refill intact. No TTP to either wrist.  Lab Results  Component Value Date   WBC 6.7 05/28/2019   HGB 8.0 (L) 05/28/2019   HCT 26.1 (L) 05/28/2019   MCV 82.9 05/28/2019   PLT 393 05/28/2019   BMET    Component Value Date/Time   NA 137 05/28/2019 0934   K 3.8 05/28/2019 0934   CL 103 05/28/2019 0934   CO2 24 05/28/2019 0934   GLUCOSE 113 (H) 05/28/2019 0934   BUN 5 (L) 05/28/2019 0934   CREATININE 0.61 05/28/2019 0934   CALCIUM 8.3 (L) 05/28/2019 0934   GFRNONAA >60 05/28/2019 0934   GFRAA >60 05/28/2019 0934     Assessment/Plan: 6 Days Post-Op   Principal Problem:   Septic arthritis of shoulder, left (HCC) Active Problems:   Injection of illicit drug within last 12 months   Hepatitis C antibody positive, s/p spontaneously cleared infection   Sepsis (HCC)   Septic pulmonary embolism (HCC)   Substance use disorder   Suspected endocarditis   MRSA bacteremia   IV drug abuse (HCC)   Septic embolism (HCC)   Staphylococcal arthritis of right shoulder (HCC)   Staphylococcal arthritis of left wrist (HCC)   PFO (patent foramen ovale)   Endocarditis of tricuspid valve   Septic arthritis of shoulder, right (HCC)   Elevated LFTs   Post-op Day6: bilateral shoulder arthroscopic irrigation and debridement - pain improving - patientcan continue to apply heating pads to shoulders as needed - continue PRN dressing changes - no growth from operative cultures  07/26/2019 05/31/2019, 1:21 PM   Armida Sans, MD Cell [phone]

## 2019-06-01 DIAGNOSIS — M00012 Staphylococcal arthritis, left shoulder: Secondary | ICD-10-CM | POA: Diagnosis not present

## 2019-06-01 DIAGNOSIS — I079 Rheumatic tricuspid valve disease, unspecified: Secondary | ICD-10-CM | POA: Diagnosis not present

## 2019-06-01 DIAGNOSIS — R768 Other specified abnormal immunological findings in serum: Secondary | ICD-10-CM | POA: Diagnosis not present

## 2019-06-01 DIAGNOSIS — F1910 Other psychoactive substance abuse, uncomplicated: Secondary | ICD-10-CM | POA: Diagnosis not present

## 2019-06-01 NOTE — Progress Notes (Signed)
     Subjective: Patient reports increased pain in left shoulder. Denies right shoulder pain. Denies wrist pain. Denies numbness or tingling in either upper extremity.   Objective:   VITALS:   Vitals:   05/31/19 0937 05/31/19 1456 05/31/19 2156 06/01/19 0426  BP: (!) 87/61 96/61 101/77 (!) 122/93  Pulse: 90 76 96 98  Resp: 16 18 16 18   Temp: 97.7 F (36.5 C) 98 F (36.7 C) 98 F (36.7 C) 98.3 F (36.8 C)  TempSrc: Oral Oral Oral Oral  SpO2: 96% 99% 100% 99%  Weight:      Height:       Patient laying comfortably on left side, sleeping.  New sterile dressings in place on bilateral shoulders, no drainage. No erythema or edema noted around sterile dressings on either shoulder. Distal sensation and capillary refill intact. No TTP to either wrist.   Lab Results  Component Value Date   WBC 6.7 05/28/2019   HGB 8.0 (L) 05/28/2019   HCT 26.1 (L) 05/28/2019   MCV 82.9 05/28/2019   PLT 393 05/28/2019   BMET    Component Value Date/Time   NA 137 05/28/2019 0934   K 3.8 05/28/2019 0934   CL 103 05/28/2019 0934   CO2 24 05/28/2019 0934   GLUCOSE 113 (H) 05/28/2019 0934   BUN 5 (L) 05/28/2019 0934   CREATININE 0.61 05/28/2019 0934   CALCIUM 8.3 (L) 05/28/2019 0934   GFRNONAA >60 05/28/2019 0934   GFRAA >60 05/28/2019 0934     Assessment/Plan: 7 Days Post-Op   Principal Problem:   Septic arthritis of shoulder, left (HCC) Active Problems:   Injection of illicit drug within last 12 months   Hepatitis C antibody positive, s/p spontaneously cleared infection   Sepsis (HCC)   Septic pulmonary embolism (HCC)   Substance use disorder   Suspected endocarditis   MRSA bacteremia   IV drug abuse (HCC)   Septic embolism (HCC)   Staphylococcal arthritis of right shoulder (HCC)   Staphylococcal arthritis of left wrist (HCC)   PFO (patent foramen ovale)   Endocarditis of tricuspid valve   Septic arthritis of shoulder, right (HCC)   Elevated LFTs  Post-op Day7: bilateral  shoulder arthroscopic irrigation and debridement - continued left shoulder pain by report but patient able to sleep comfortably on left shoulder - no growth from operative cultures of bilateral shoulders - patientcan continue to apply heating pads to shoulders as needed - continue PRN dressing changes  07/26/2019 06/01/2019, 7:33 AM   07/30/2019, MD Cell [phone]

## 2019-06-01 NOTE — Progress Notes (Signed)
OT Cancellation Note  Patient Details Name: Katreena Schupp MRN: [id number] DOB: 10-14-1990   Cancelled Treatment:     Attempt to see patient ~1100 however patient's room is very dark, multiple attempts to awaken patient however sleeping soundly. Will re-attempt as schedule allows.  Myrtie Neither OT OT office: [phone]   Carmelia Roller 06/01/2019, 12:25 PM

## 2019-06-01 NOTE — Progress Notes (Signed)
PROGRESS NOTE  Christy Pearson GGE:[id number] DOB: 1990/08/20 DOA: 05/18/2019 PCP: Patient, No Pcp Per  Brief History   The patient is a 29 yr old woman with a medical history significant for hepatitis C, IV heroin, crystal meth abuse, and kidney stones presented to the emergency room at [facility] with about 1 week of right flank pain.  She was found to have cavitary pulmonary lesions with septic emboli.  Blood cultures with MRSA. 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 Bilateral shoulder infection/ spinal infection.  Assessment and Plan # MRSA bacteremia with severe sepsis, tricuspid valve endocarditis, multilevel vertebral osteomyelitis and septic pulmonary emboli and bilateral septic shoulder arthritis: Sepsis improved.   Repeat blood cultures negative from 1/29. On IV vancomycin and followed by infectious disease.   She will need prolonged antibiotic therapy and will need to stay in the hospital as she is not outpatient therapy candidate.   Underwent arthrocentesis and debridement, 05/25/2019.  Followed by orthopedic surgery.  No growth so far.  Weightbearing and mobility. Temperature 102 at 2/9 night, none since then.  No blood cultures were drawn. We will put orders to repeat blood cultures if patient has temperature more than 101.  Clinically no evidence of new abscesses.  Suspected right wrist septic arthritis: Clinically improved.    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  Neurosurgery has seen the patient.   Abnormal LFTs: From sepsis.  Liver ultrasound was benign.  Normalized.  E. coli UTI: Treated with ceftriaxone and finished therapy.  Anemia of chronic disease: 1 unit blood transfusion.  1 unit IV iron given.  Hemoglobin 8.  Polysubstance abuse/IV drug use: Counseled.  On methadone.  Stable. Patient wants to come off of all drugs before being discharged.   DVT prophylaxis:  lovenox Code Status:  Full Code Family Communication: Patient communicating with her mom. Disposition Plan: Patient from home.  on active treatment for MRSA bacteremia and endocarditis. She is an active IV drug user, she is not safe for discharge to use outpatient antibiotic therapy.  Anticipate she will stay in the hospital for some time to finish her antibiotic therapy.  Consultants  . Infectious disease . Cardiology . Orthopedics Mardelle Matte)  Procedures  . Transfusion of one unit of PRBC's.  Antibiotics   Anti-infectives (From admission, onward)   Start     Dose/Rate Route Frequency Ordered Stop   05/31/19 0200  vancomycin (VANCOREADY) IVPB 750 mg/150 mL     750 mg 150 mL/hr over 60 Minutes Intravenous Every 12 hours 05/31/19 0126     05/30/19 2200  vancomycin (VANCOREADY) IVPB 750 mg/150 mL  Status:  Discontinued     750 mg 150 mL/hr over 60 Minutes Intravenous Every 12 hours 05/30/19 1207 05/30/19 2126   05/25/19 1230  amoxicillin (AMOXIL) capsule 500 mg     500 mg Oral  Once 05/25/19 1120 05/25/19 1435   05/21/19 1000  vancomycin (VANCOCIN) IVPB 1000 mg/200 mL premix  Status:  Discontinued     1,000 mg 200 mL/hr over 60 Minutes Intravenous Every 12 hours 05/21/19 0948 05/30/19 1207   05/20/19 1400  cefTRIAXone (ROCEPHIN) 2 g in sodium chloride 0.9 % 100 mL IVPB  Status:  Discontinued     2 g 200 mL/hr over 30 Minutes Intravenous Every 24 hours 05/20/19 1328 05/22/19 0947   05/19/19 2200  levofloxacin (LEVAQUIN) IVPB 750 mg  Status:  Discontinued     750 mg 100 mL/hr over 90 Minutes Intravenous  Every 24 hours 05/19/19 0212 05/19/19 0213   05/19/19 2200  levofloxacin (LEVAQUIN) IVPB 500 mg  Status:  Discontinued     500 mg 100 mL/hr over 60 Minutes Intravenous Every 24 hours 05/19/19 0213 05/19/19 0539   05/19/19 1000  vancomycin (VANCOREADY) IVPB 750 mg/150 mL  Status:  Discontinued     750 mg 150 mL/hr over 60 Minutes Intravenous Every 12 hours 05/19/19 0539 05/21/19 0948   05/19/19 0600   ceFEPIme (MAXIPIME) 2 g in sodium chloride 0.9 % 100 mL IVPB  Status:  Discontinued     2 g 200 mL/hr over 30 Minutes Intravenous Every 8 hours 05/19/19 0539 05/19/19 2348   05/19/19 0130  vancomycin (VANCOCIN) IVPB 1000 mg/200 mL premix     1,000 mg 200 mL/hr over 60 Minutes Intravenous  Once 05/19/19 0122 05/19/19 0253   05/18/19 2345  levofloxacin (LEVAQUIN) IVPB 750 mg     750 mg 100 mL/hr over 90 Minutes Intravenous  Once 05/18/19 2338 05/19/19 0140     Subjective  Seen and examined.  No more fever.  Does not want to interact and does not want to get out of the bed.  No other events. Objective   Vitals:  Vitals:   05/31/19 2156 06/01/19 0426  BP: 101/77 (!) 122/93  Pulse: 96 98  Resp: 16 18  Temp: 98 F (36.7 C) 98.3 F (36.8 C)  SpO2: 100% 99%   Exam:  Gen: awake, alert, NAD.  Anxious, sleepy. HEENT: NCAT. Neck supple.  No JVD, no icterus Lungs: BBS, CTAB CVS: normal S1-S2, regular rhythm,  Abd: soft, Non tender, non distended, BS present Extremities: Bilateral shoulders with surgical dressing.  No swelling.  Back without swelling.  Wrist with no swelling or pain.   I have personally reviewed the following:   Today's Data   Vitals with BMI 06/01/2019 05/31/2019 05/31/2019  Height - - -  Weight - - -  BMI - - -  Systolic 122 101 96  Diastolic 93 77 61  Pulse 98 96 76    Micro Data  . Blood cultures x 2 from 05/18/2019 MRSA . Blood cultures x 2 from 05/18/2019 - No growth . Urine Culture 05/18/2019 - E. coli  Imaging  . CT renal stone study: Multiple cavitary and non-cavitary pulmonary lesions consistent with septic emboli. . MRI MRI of shoulders reveal large joint effusion indicating at least septic arthritis. MRI of spine also reveals acute discitis/osteomyelitis at L5-S1 level with ventral epidural enhancement representing phlegmon with partial effacement of the thecal sac and potential compression of traversing S1 nerve roots.  Cardiology Data   . Echocardiogram 05/19/2019 demonstrated severe tricuspid regurgitation and abnormalities suspicious for valvular vegetation due to endocarditis. . TEE: 05/23/19 Tricuspid valve endocarditis with MRSA bacteremia, with likely paradoxical septic embolization (renal septic emboli).  Scheduled Meds: . enoxaparin (LOVENOX) injection  40 mg Subcutaneous Q24H  . ferrous sulfate  325 mg Oral BID  . magnesium oxide  400 mg Oral BID  . methadone  15 mg Oral Daily  . prenatal multivitamin  1 tablet Oral Daily   Continuous Infusions: . lactated ringers 10 mL/hr at 05/24/19 0902  . vancomycin 750 mg (06/01/19 0216)    Principal Problem:   Septic arthritis of shoulder, left (HCC) Active Problems:   Injection of illicit drug within last 12 months   Hepatitis C antibody positive, s/p spontaneously cleared infection   Sepsis (HCC)   Septic pulmonary embolism (HCC)   Substance use disorder  Suspected endocarditis   MRSA bacteremia   IV drug abuse (HCC)   Septic embolism (HCC)   Staphylococcal arthritis of right shoulder (HCC)   Staphylococcal arthritis of left wrist (HCC)   PFO (patent foramen ovale)   Endocarditis of tricuspid valve   Septic arthritis of shoulder, right (HCC)   Elevated LFTs   LOS: 13 days   Total time spent: 20 minutes

## 2019-06-02 DIAGNOSIS — I079 Rheumatic tricuspid valve disease, unspecified: Secondary | ICD-10-CM | POA: Diagnosis not present

## 2019-06-02 DIAGNOSIS — R768 Other specified abnormal immunological findings in serum: Secondary | ICD-10-CM | POA: Diagnosis not present

## 2019-06-02 DIAGNOSIS — M00012 Staphylococcal arthritis, left shoulder: Secondary | ICD-10-CM | POA: Diagnosis not present

## 2019-06-02 DIAGNOSIS — F1910 Other psychoactive substance abuse, uncomplicated: Secondary | ICD-10-CM | POA: Diagnosis not present

## 2019-06-02 LAB — VANCOMYCIN, PEAK: Vancomycin Pk: 23 ug/mL — ABNORMAL LOW (ref 30–40)

## 2019-06-02 MED ORDER — RAMELTEON 8 MG PO TABS
8.0000 mg | ORAL_TABLET | Freq: Once | ORAL | Status: AC
  Administered 2019-06-02: 8 mg via ORAL
  Filled 2019-06-02: qty 1

## 2019-06-02 NOTE — Progress Notes (Signed)
     Subjective: Patient sitting up comfortably in bed today. States right shoulder pain is worse than left today. Mild pain with movement of bilateral shoulders. Reports she is able to feed herself and wipe without issue.  Objective:   VITALS:   Vitals:   06/01/19 0426 06/01/19 1242 06/01/19 2008 06/02/19 0526  BP: (!) 122/93 90/65 113/90 110/77  Pulse: 98 98 95 99  Resp: 18 18 14 18   Temp: 98.3 F (36.8 C) 98 F (36.7 C) 98.5 F (36.9 C) 98.1 F (36.7 C)  TempSrc: Oral Oral Oral Oral  SpO2: 99% 100% 100% 100%  Weight:      Height:       Seated comfortably in bed. Incision sites examined. Stitches in place. No signs of erythema or edema. No drainage. Forward flexion of right shoulder 0-80 degrees and left shoulder 0-90 degrees.  Lab Results  Component Value Date   WBC 6.7 05/28/2019   HGB 8.0 (L) 05/28/2019   HCT 26.1 (L) 05/28/2019   MCV 82.9 05/28/2019   PLT 393 05/28/2019   BMET    Component Value Date/Time   NA 137 05/28/2019 0934   K 3.8 05/28/2019 0934   CL 103 05/28/2019 0934   CO2 24 05/28/2019 0934   GLUCOSE 113 (H) 05/28/2019 0934   BUN 5 (L) 05/28/2019 0934   CREATININE 0.61 05/28/2019 0934   CALCIUM 8.3 (L) 05/28/2019 0934   GFRNONAA >60 05/28/2019 0934   GFRAA >60 05/28/2019 0934     Assessment/Plan: 8 Days Post-Op   Principal Problem:   Septic arthritis of shoulder, left (HCC) Active Problems:   Injection of illicit drug within last 12 months   Hepatitis C antibody positive, s/p spontaneously cleared infection   Sepsis (HCC)   Septic pulmonary embolism (HCC)   Substance use disorder   Suspected endocarditis   MRSA bacteremia   IV drug abuse (HCC)   Septic embolism (HCC)   Staphylococcal arthritis of right shoulder (HCC)   Staphylococcal arthritis of left wrist (HCC)   PFO (patent foramen ovale)   Endocarditis of tricuspid valve   Septic arthritis of shoulder, right (HCC)   Elevated LFTs  Post-op Day8: bilateral shoulder  arthroscopic irrigation and debridement - continue working on AAROM exercises per OT - stitches to be removed most likely on Monday - continue daily dressing changes   Wednesday 06/02/2019, 3:54 PM   07/31/2019, MD Cell [phone]

## 2019-06-02 NOTE — Progress Notes (Signed)
PROGRESS NOTE  Christy Pearson JJO:[id number] DOB: 1990-07-31 DOA: 05/18/2019 PCP: Patient, No Pcp Per  Brief History   The patient is a 29 yr old woman with a medical history significant for hepatitis C, IV heroin, crystal meth abuse, and kidney stones presented to the emergency room at [facility] with about 1 week of right flank pain.  She was found to have cavitary pulmonary lesions with septic emboli.  Blood cultures with MRSA. 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 Bilateral shoulder infection/ spinal infection.  Assessment and Plan # MRSA bacteremia with severe sepsis, tricuspid valve endocarditis, multilevel vertebral osteomyelitis and septic pulmonary emboli and bilateral septic shoulder arthritis: Sepsis improved.   Repeat blood cultures negative from 1/29. On IV vancomycin and followed by infectious disease.   She will need prolonged antibiotic therapy and will need to stay in the hospital as she is not outpatient therapy candidate.   Underwent arthrocentesis and debridement, 05/25/2019.  Followed by orthopedic surgery.  No growth so far.  Weightbearing and mobility. We will put orders to repeat blood cultures if patient has temperature more than 101.  Clinically no evidence of new abscesses. ID recommended 6 weeks of antibiotics until 06/30/2019.  Will contact ID while finishing antibiotics if needs suppressive therapy.  Suspected right wrist septic arthritis: Clinically improved.    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  Neurosurgery has seen the patient.   Abnormal LFTs: From sepsis.  Liver ultrasound was benign.  Normalized.  E. coli UTI: Treated with ceftriaxone and finished therapy.  Anemia of chronic disease: 1 unit blood transfusion.  1 unit IV iron given.  Hemoglobin 8. Recheck labs for morning.  Polysubstance abuse/IV drug use: Counseled.  On methadone.  Stable. Patient wants to come off of all  drugs before being discharged.   DVT prophylaxis:  lovenox Code Status: Full Code Family Communication: Patient communicating with her family. Disposition Plan: Patient from home.  on active treatment for MRSA bacteremia and endocarditis. She is an active IV drug user, she is not safe for discharge to use outpatient antibiotic therapy.  Anticipate she will stay in the hospital for some time to finish her antibiotic therapy. Last dose of IV vancomycin 3/12.  Consultants  . Infectious disease . Cardiology . Orthopedics [facility])  Procedures  . Transfusion of one unit of PRBC's.  Antibiotics   Anti-infectives (From admission, onward)   Start     Dose/Rate Route Frequency Ordered Stop   05/31/19 0200  vancomycin (VANCOREADY) IVPB 750 mg/150 mL     750 mg 150 mL/hr over 60 Minutes Intravenous Every 12 hours 05/31/19 0126     05/30/19 2200  vancomycin (VANCOREADY) IVPB 750 mg/150 mL  Status:  Discontinued     750 mg 150 mL/hr over 60 Minutes Intravenous Every 12 hours 05/30/19 1207 05/30/19 2126   05/25/19 1230  amoxicillin (AMOXIL) capsule 500 mg     500 mg Oral  Once 05/25/19 1120 05/25/19 1435   05/21/19 1000  vancomycin (VANCOCIN) IVPB 1000 mg/200 mL premix  Status:  Discontinued     1,000 mg 200 mL/hr over 60 Minutes Intravenous Every 12 hours 05/21/19 0948 05/30/19 1207   05/20/19 1400  cefTRIAXone (ROCEPHIN) 2 g in sodium chloride 0.9 % 100 mL IVPB  Status:  Discontinued     2 g 200 mL/hr over 30 Minutes Intravenous Every 24 hours 05/20/19 1328 05/22/19 0947   05/19/19 2200  levofloxacin (LEVAQUIN) IVPB 750 mg  Status:  Discontinued     750 mg 100 mL/hr over 90 Minutes Intravenous Every 24 hours 05/19/19 0212 05/19/19 0213   05/19/19 2200  levofloxacin (LEVAQUIN) IVPB 500 mg  Status:  Discontinued     500 mg 100 mL/hr over 60 Minutes Intravenous Every 24 hours 05/19/19 0213 05/19/19 0539   05/19/19 1000  vancomycin (VANCOREADY) IVPB 750 mg/150 mL  Status:  Discontinued      750 mg 150 mL/hr over 60 Minutes Intravenous Every 12 hours 05/19/19 0539 05/21/19 0948   05/19/19 0600  ceFEPIme (MAXIPIME) 2 g in sodium chloride 0.9 % 100 mL IVPB  Status:  Discontinued     2 g 200 mL/hr over 30 Minutes Intravenous Every 8 hours 05/19/19 0539 05/19/19 2348   05/19/19 0130  vancomycin (VANCOCIN) IVPB 1000 mg/200 mL premix     1,000 mg 200 mL/hr over 60 Minutes Intravenous  Once 05/19/19 0122 05/19/19 0253   05/18/19 2345  levofloxacin (LEVAQUIN) IVPB 750 mg     750 mg 100 mL/hr over 90 Minutes Intravenous  Once 05/18/19 2338 05/19/19 0140     Subjective  Seen and examined.  Discussed with nurse.  Patient wants to sleep.  She does not want to talk. Objective   Vitals:  Vitals:   06/01/19 2008 06/02/19 0526  BP: 113/90 110/77  Pulse: 95 99  Resp: 14 18  Temp: 98.5 F (36.9 C) 98.1 F (36.7 C)  SpO2: 100% 100%   Exam:  Gen: awake, alert, NAD.sleepy. HEENT: NCAT. Neck supple.  No JVD, no icterus Lungs: BBS, CTAB CVS: normal S1-S2, regular rhythm,  Abd: soft, Non tender, non distended, BS present Extremities: Bilateral shoulders with surgical dressing.  No swelling.  Back without swelling.  Wrist with no swelling or pain.   I have personally reviewed the following:   Today's Data   Vitals with BMI 06/02/2019 06/01/2019 06/01/2019  Height - - -  Weight - - -  BMI - - -  Systolic 110 113 90  Diastolic 77 90 65  Pulse 99 95 98    Micro Data  . Blood cultures x 2 from 05/18/2019 MRSA . Blood cultures x 2 from 05/18/2019 - No growth . Urine Culture 05/18/2019 - E. coli  Imaging  . CT renal stone study: Multiple cavitary and non-cavitary pulmonary lesions consistent with septic emboli. . MRI MRI of shoulders reveal large joint effusion indicating at least septic arthritis. MRI of spine also reveals acute discitis/osteomyelitis at L5-S1 level with ventral epidural enhancement representing phlegmon with partial effacement of the thecal sac and potential  compression of traversing S1 nerve roots.  Cardiology Data  . Echocardiogram 05/19/2019 demonstrated severe tricuspid regurgitation and abnormalities suspicious for valvular vegetation due to endocarditis. . TEE: 05/23/19 Tricuspid valve endocarditis with MRSA bacteremia, with likely paradoxical septic embolization (renal septic emboli).  Scheduled Meds: . enoxaparin (LOVENOX) injection  40 mg Subcutaneous Q24H  . ferrous sulfate  325 mg Oral BID  . magnesium oxide  400 mg Oral BID  . methadone  15 mg Oral Daily  . prenatal multivitamin  1 tablet Oral Daily   Continuous Infusions: . lactated ringers 10 mL/hr at 05/24/19 0902  . vancomycin 750 mg (06/02/19 0223)    Principal Problem:   Septic arthritis of shoulder, left (HCC) Active Problems:   Injection of illicit drug within last 12 months   Hepatitis C antibody positive, s/p spontaneously cleared infection   Sepsis (HCC)   Septic pulmonary embolism (HCC)  Substance use disorder   Suspected endocarditis   MRSA bacteremia   IV drug abuse ([place])   Septic embolism (HCC)   Staphylococcal arthritis of right shoulder (HCC)   Staphylococcal arthritis of left wrist (HCC)   PFO (patent foramen ovale)   Endocarditis of tricuspid valve   Septic arthritis of shoulder, right (HCC)   Elevated LFTs   LOS: 14 days   Total time spent: 20 minutes

## 2019-06-02 NOTE — Progress Notes (Signed)
Pharmacy Antibiotic Note  Christy Pearson is a 29 y.o. female admitted on 05/18/2019 with MRSA.  Pharmacy has been consulted for Vancomycin dosing.  ID: Metastatic MRSA bacteremia and TV endocarditis with PFO, BL cavitary septic emboli, BL septic shoulders, possibly septic wrist and L shoulder osteo.discitis and osteomyelitis from L5-S1 with ventral epidural enhancement concerning for phlegmon with partial effacement of the thecal sac. S/p shoulder surgery 2/4. Spiked temp 102.6 (on 2/9). Now afebrile.  - TEE: 1X0.5 cm vegetations on at least two tricuspid leaflets of TV. Severe TR - MRI R shoulder: findings suspicious for septic arthritis; no evidence of osteo  - MRI L shoulder: small joint effusion suspicious for septic arthritis; suggestive of osteo - MRI spine: possible phelgmon at L5-S1 and presumed discitis/osteo - MRI L wrist: midcarpal joint effusion - possible septic arthritis (ortho - no intervention) - Bilateral shoulder arthroscopy 2/4 - - CVTS consulted - no intervention for now  - Suspected PFO  Vancomycin 1/29 >>(3/12) CTX 1/30>>2/1 for UTI Cefepime 1/29 x3 doses -2/4: Amoxicillin X 1 to document tolerance (Patient has taken in the past with no issues)  1/31: VP 21, VT 9, AUC 366 - increase to 1000mg  Q12 hrs 2/2: VP 29, VT 9, AUC 442 - continue  2/8: VP 37, VT 16, AUC 638 - dec to 750mg  q12h (expected AUC 478). 2/12: ____________   1/28 Ucx: ecoli 1/28 Bcx: MRSA 1/29 Bcx x 2: neg 1/29 resp panel - neg 2/4 R & L shoulder cx - MRSA    Plan: - Adjust vanc to 750mg  IV q12h on 2/9. Levels 2/12-2/13 - 6 weeks of IV antibiotics since she cleared her bacteremia ( through 3/12) and finish at least 8 weeks of antibiotics post shoulder surgery with oral abx (surgery 2/4) - CMET now ordered daily    Height: 5' 7.01" (170.2 cm) Weight: 124 lb 9 oz (56.5 kg) IBW/kg (Calculated) : 61.62  Temp (24hrs), Avg:98.3 F (36.8 C), Min:98.1 F (36.7 C), Max:98.5 F (36.9  C)  Recent Labs  Lab 05/28/19 0934 05/30/19 0039 05/30/19 0854  WBC 6.7  --   --   CREATININE 0.61  --   --   VANCOTROUGH  --   --  16  VANCOPEAK  --  37  --     Estimated Creatinine Clearance: 95 mL/min (by C-G formula based on SCr of 0.61 mg/dL).    Allergies  Allergen Reactions  . Tylenol [Acetaminophen] Rash    Pt just reported tylenol allergy at this visit at 2338pm     Ishaaq Penna S. 07/26/19, PharmD, BCPS Clinical Staff Pharmacist [email or web address] 07/28/19 06/02/2019 1:35 PM

## 2019-06-02 NOTE — Progress Notes (Signed)
Occupational Therapy Treatment Patient Details Name: Christy Pearson MRN: [id number] DOB: 1990/11/26 Today's Date: 06/02/2019    History of present illness 29 yo female admitted with fevere chills and back pain. 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 PMH hepatitis C, Kidney stones, IV drug abuse,    OT comments  Patient agreeable to OT, requests to use bathroom before exercising. Patient independent with ambulation and toilet transfer, able to manage IV pole safely, no physical assist of loss of balance noted. Patient participate in exercises listed below. OT educate patient on AAROM exercises such as towel slides and wall walks to improve ROM, patient state "I prefer to move my arms on my own without anything." Encourage patient to perform A/AROM exercises 3x/day to maximize mobility in UEs. Patient verbalize understanding.    Follow Up Recommendations  No OT follow up;Follow surgeon's recommendation for DC plan and follow-up therapies    Equipment Recommendations  None recommended by OT       Precautions / Restrictions Precautions Precautions: Fall Restrictions Weight Bearing Restrictions: No RUE Weight Bearing: Weight bearing as tolerated LUE Weight Bearing: Weight bearing as tolerated       Mobility Bed Mobility Overal bed mobility: Modified Independent                Transfers Overall transfer level: Independent Equipment used: None             General transfer comment: no physical assist required, demonstrates adequate safety managing IV pole    Balance Overall balance assessment: No apparent balance deficits (not formally assessed)                                         ADL either performed or assessed with clinical judgement   ADL Overall ADL's : Needs  assistance/impaired Eating/Feeding: Independent;Sitting Eating/Feeding Details (indicate cue type and reason): pt reports she is able to feed herself now using R UE                     Toilet Transfer: Independent;Ambulation;Regular Glass blower/designer Details (indicate cue type and reason): pt manage IV pole independently and transfer on/off toilet without assist Toileting- Clothing Manipulation and Hygiene: Independent                         Cognition Arousal/Alertness: Awake/alert Behavior During Therapy: WFL for tasks assessed/performed Overall Cognitive Status: Within Functional Limits for tasks assessed                                 General Comments: self limiting        Exercises General Exercises - Upper Extremity Shoulder Flexion: AROM;5 reps;Both;Seated Shoulder ABduction: AROM;Both;5 reps;Seated Elbow Flexion: AROM;Both;5 reps;Seated Elbow Extension: AROM;Both;5 reps;Seated   Shoulder Instructions       General Comments educate patient in AAROM exercises with visual demo of wall walks in flexion, abduction and verbal instruction of towel slides, patient states she likes "moving them on her own" encourage to perform exercises 3x/day    Pertinent Vitals/ Pain       Pain Assessment: Faces Faces Pain Scale: Hurts little more Pain Location: B shoulders  Pain Descriptors / Indicators: Guarding;Sore;Grimacing  Pain Intervention(s): Limited activity within patient's tolerance         Frequency  Min 3X/week        Progress Toward Goals  OT Goals(current goals can now be found in the care plan section)  Progress towards OT goals: Progressing toward goals  Acute Rehab OT Goals Patient Stated Goal: less pain and to move my arms more OT Goal Formulation: With patient Time For Goal Achievement: 06/13/19 ADL Goals Pt/caregiver will Perform Home Exercise Program: Increased ROM;Both right and left upper extremity;With written  HEP provided  Plan Discharge plan remains appropriate       AM-PAC OT "6 Clicks" Daily Activity     Outcome Measure   Help from another person eating meals?: None Help from another person taking care of personal grooming?: A Little Help from another person toileting, which includes using toliet, bedpan, or urinal?: A Little Help from another person bathing (including washing, rinsing, drying)?: A Little Help from another person to put on and taking off regular upper body clothing?: A Little Help from another person to put on and taking off regular lower body clothing?: A Little 6 Click Score: 19    End of Session    OT Visit Diagnosis: Muscle weakness (generalized) (M62.81);Pain Pain - Right/Left: (bilateral) Pain - part of body: Shoulder   Activity Tolerance Patient tolerated treatment well   Patient Left in bed;with call bell/phone within reach   Nurse Communication Mobility status        Time: 1354-1410 OT Time Calculation (min): 16 min  Charges: OT General Charges $OT Visit: 1 Visit OT Treatments $Therapeutic Exercise: 8-22 mins  Myrtie Neither OT OT office: [phone]   Carmelia Roller 06/02/2019, 2:21 PM

## 2019-06-03 DIAGNOSIS — M00012 Staphylococcal arthritis, left shoulder: Secondary | ICD-10-CM | POA: Diagnosis not present

## 2019-06-03 LAB — COMPREHENSIVE METABOLIC PANEL
ALT: 38 U/L (ref 0–44)
AST: 24 U/L (ref 15–41)
Albumin: 2.2 g/dL — ABNORMAL LOW (ref 3.5–5.0)
Alkaline Phosphatase: 163 U/L — ABNORMAL HIGH (ref 38–126)
Anion gap: 10 (ref 5–15)
BUN: 9 mg/dL (ref 6–20)
CO2: 26 mmol/L (ref 22–32)
Calcium: 9 mg/dL (ref 8.9–10.3)
Chloride: 105 mmol/L (ref 98–111)
Creatinine, Ser: 0.49 mg/dL (ref 0.44–1.00)
GFR calc Af Amer: >60 mL/min (ref >60)
GFR calc non Af Amer: >60 mL/min (ref >60)
Glucose, Bld: 98 mg/dL (ref 70–99)
Potassium: 4 mmol/L (ref 3.5–5.1)
Sodium: 141 mmol/L (ref 135–145)
Total Bilirubin: 0.3 mg/dL (ref 0.3–1.2)
Total Protein: 6.9 g/dL (ref 6.5–8.1)

## 2019-06-03 LAB — CBC WITH DIFFERENTIAL/PLATELET
Abs Immature Granulocytes: 0.02 10*3/uL (ref 0.00–0.07)
Basophils Absolute: 0.1 10*3/uL (ref 0.0–0.1)
Basophils Relative: 1 %
Eosinophils Absolute: 0.1 10*3/uL (ref 0.0–0.5)
Eosinophils Relative: 2 %
HCT: 28.9 % — ABNORMAL LOW (ref 36.0–46.0)
Hemoglobin: 8.7 g/dL — ABNORMAL LOW (ref 12.0–15.0)
Immature Granulocytes: 1 %
Lymphocytes Relative: 36 %
Lymphs Abs: 1.5 10*3/uL (ref 0.7–4.0)
MCH: 25.3 pg — ABNORMAL LOW (ref 26.0–34.0)
MCHC: 30.1 g/dL (ref 30.0–36.0)
MCV: 84 fL (ref 80.0–100.0)
Monocytes Absolute: 0.4 10*3/uL (ref 0.1–1.0)
Monocytes Relative: 10 %
Neutro Abs: 2.1 10*3/uL (ref 1.7–7.7)
Neutrophils Relative %: 50 %
Platelets: 335 10*3/uL (ref 150–400)
RBC: 3.44 MIL/uL — ABNORMAL LOW (ref 3.87–5.11)
RDW: 20.7 % — ABNORMAL HIGH (ref 11.5–15.5)
WBC: 4.3 10*3/uL (ref 4.0–10.5)
nRBC: 0 % (ref 0.0–0.2)

## 2019-06-03 LAB — PHOSPHORUS: Phosphorus: 5.4 mg/dL — ABNORMAL HIGH (ref 2.5–4.6)

## 2019-06-03 LAB — VANCOMYCIN, TROUGH: Vancomycin Tr: 8 ug/mL — ABNORMAL LOW (ref 15–20)

## 2019-06-03 LAB — MAGNESIUM: Magnesium: 2 mg/dL (ref 1.7–2.4)

## 2019-06-03 MED ORDER — VANCOMYCIN HCL IN DEXTROSE 1-5 GM/200ML-% IV SOLN
1000.0000 mg | Freq: Two times a day (BID) | INTRAVENOUS | Status: DC
  Administered 2019-06-03 – 2019-06-08 (×11): 1000 mg via INTRAVENOUS
  Filled 2019-06-03 (×12): qty 200

## 2019-06-03 MED ORDER — HYDROXYZINE HCL 10 MG PO TABS
10.0000 mg | ORAL_TABLET | Freq: Three times a day (TID) | ORAL | Status: DC | PRN
  Administered 2019-06-03 – 2019-06-30 (×45): 10 mg via ORAL
  Filled 2019-06-03 (×51): qty 1

## 2019-06-03 NOTE — Progress Notes (Signed)
PROGRESS NOTE  Christy Pearson QMV:[id number] DOB: 02-Oct-1990 DOA: 05/18/2019 PCP: Patient, No Pcp Per  HPI/Recap of past 24 hours: The patient is a 29 yr old woman with a medical history significant for hepatitis C, IV heroin, crystal meth abuse, and kidney stones presented to the emergency room at [facility] with about 1 week of right flank pain.  She was found to have cavitary pulmonary lesions with septic emboli.  Blood cultures with MRSA. 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 Bilateral shoulder infection/ spinal infection.  06/03/19: Seen in her room.  She asks to be left alone.  She denies chest pain, palpitations or dyspnea.  She has no new complaints.  Vital signs and labs reviewed and are stable.  Afebrile with no leukocytosis.  Hemoglobin trending up at 8.7.    Assessment/Plan: Principal Problem:   Septic arthritis of shoulder, left (HCC) Active Problems:   Injection of illicit drug within last 12 months   Hepatitis C antibody positive, s/p spontaneously cleared infection   Sepsis (HCC)   Septic pulmonary embolism (HCC)   Substance use disorder   Suspected endocarditis   MRSA bacteremia   IV drug abuse (HCC)   Septic embolism (HCC)   Staphylococcal arthritis of right shoulder (HCC)   Staphylococcal arthritis of left wrist (HCC)   PFO (patent foramen ovale)   Endocarditis of tricuspid valve   Septic arthritis of shoulder, right (HCC)   Elevated LFTs  MRSA bacteremia with severe sepsis, tricuspid valve endocarditis, multilevel vertebral osteomyelitis and septic pulmonary emboli and bilateral septic shoulder arthritis: Sepsis improved.   Repeat blood cultures negative from 1/29. On IV vancomycin and followed by infectious disease.   She will need prolonged antibiotic therapy and will need to stay in the hospital as she is not outpatient therapy candidate.   Underwent arthrocentesis and debridement, 05/25/2019.  Followed by orthopedic surgery.   No growth so far.  Weightbearing and mobility. We will put orders to repeat blood cultures if patient has temperature more than 101.  Clinically no evidence of new abscesses. ID recommended 6 weeks of antibiotics until 06/30/2019.  Will contact ID while finishing antibiotics if needs suppressive therapy.  Suspected right wrist septic arthritis: Clinically improved.    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  Neurosurgery has seen the patient.   Abnormal LFTs: From sepsis.  Liver ultrasound was benign.  Normalized.  E. coli UTI: Treated with ceftriaxone and finished therapy.  Anemia of chronic disease: 1 unit blood transfusion.  1 unit IV iron given.  Hemoglobin 8. Recheck labs for morning.  Polysubstance abuse/IV drug use: Counseled.  On methadone.  Stable. Patient wants to come off of all drugs before being discharged.  Isolated elevated alkaline phosphatase, unclear etiology Trending up to 163 from 134 AST ALT and total bilirubin normal. Continue to closely monitor.   DVT prophylaxis:  lovenox subcu daily. Code Status: Full Code Family Communication: Patient communicating with her family. Disposition Plan: Patient from home.  on active treatment for MRSA bacteremia and endocarditis. She is an active IV drug user, she is not safe for discharge to use outpatient antibiotic therapy.  Anticipate she will stay in the hospital for some time to finish her antibiotic therapy. Last dose of IV vancomycin 3/12.  Consultants   Infectious disease  Cardiology  Orthopedics Dion Saucier)  Procedures   Transfusion of one unit of PRBC's.    Objective: Vitals:   06/01/19 2008 06/02/19 2841 06/02/19 1603  06/02/19 2045  BP: 113/90 110/77 (!) 128/98 115/85  Pulse: 95 99 (!) 110 (!) 101  Resp: 14 18 18 18   Temp: 98.5 F (36.9 C) 98.1 F (36.7 C) 98.9 F (37.2 C) 99.1 F (37.3 C)  TempSrc: Oral Oral Oral Oral  SpO2: 100% 100% 100%  100%  Weight:      Height:        Intake/Output Summary (Last 24 hours) at 06/03/2019 0932 Last data filed at 06/03/2019 0759 Gross per 24 hour  Intake 895.63 ml  Output --  Net 895.63 ml   Filed Weights   05/19/19 0400 05/24/19 0836 05/25/19 1728  Weight: 56.5 kg 56.5 kg 56.5 kg    Exam:  . General: 29 y.o. year-old female well developed well nourished in no acute distress.  Alert and oriented x3. . Cardiovascular: Regular rate and rhythm with no rubs or gallops.  No thyromegaly or JVD noted.   30 Respiratory: Clear to auscultation with no wheezes or rales. Good inspiratory effort. . Abdomen: Soft nontender nondistended with normal bowel sounds x4 quadrants. . Musculoskeletal: No lower extremity edema. 2/4 pulses in all 4 extremities. Marland Kitchen Psychiatry: Mood is appropriate for condition and setting   Data Reviewed: CBC: Recent Labs  Lab 05/28/19 0934 06/03/19 0403  WBC 6.7 4.3  NEUTROABS  --  2.1  HGB 8.0* 8.7*  HCT 26.1* 28.9*  MCV 82.9 84.0  PLT 393 335   Basic Metabolic Panel: Recent Labs  Lab 05/28/19 0934 06/03/19 0403  NA 137 141  K 3.8 4.0  CL 103 105  CO2 24 26  GLUCOSE 113* 98  BUN 5* 9  CREATININE 0.61 0.49  CALCIUM 8.3* 9.0  MG 1.5* 2.0  PHOS 3.5 5.4*   GFR: Estimated Creatinine Clearance: 95 mL/min (by C-G formula based on SCr of 0.49 mg/dL). Liver Function Tests: Recent Labs  Lab 05/28/19 0934 06/03/19 0403  AST 37 24  ALT 65* 38  ALKPHOS 134* 163*  BILITOT 0.6 0.3  PROT 6.0* 6.9  ALBUMIN 1.9* 2.2*   No results for input(s): LIPASE, AMYLASE in the last 168 hours. No results for input(s): AMMONIA in the last 168 hours. Coagulation Profile: No results for input(s): INR, PROTIME in the last 168 hours. Cardiac Enzymes: No results for input(s): CKTOTAL, CKMB, CKMBINDEX, TROPONINI in the last 168 hours. BNP (last 3 results) No results for input(s): PROBNP in the last 8760 hours. HbA1C: No results for input(s): HGBA1C in the last 72  hours. CBG: No results for input(s): GLUCAP in the last 168 hours. Lipid Profile: No results for input(s): CHOL, HDL, LDLCALC, TRIG, CHOLHDL, LDLDIRECT in the last 72 hours. Thyroid Function Tests: No results for input(s): TSH, T4TOTAL, FREET4, T3FREE, THYROIDAB in the last 72 hours. Anemia Panel: No results for input(s): VITAMINB12, FOLATE, FERRITIN, TIBC, IRON, RETICCTPCT in the last 72 hours. Urine analysis:    Component Value Date/Time   COLORURINE YELLOW 05/18/2019 2252   APPEARANCEUR CLOUDY (A) 05/18/2019 2252   LABSPEC 1.020 05/18/2019 2252   PHURINE 5.5 05/18/2019 2252   GLUCOSEU NEGATIVE 05/18/2019 2252   HGBUR NEGATIVE 05/18/2019 2252   BILIRUBINUR MODERATE (A) 05/18/2019 2252   KETONESUR NEGATIVE 05/18/2019 2252   PROTEINUR 30 (A) 05/18/2019 2252   UROBILINOGEN 0.2 10/28/2015 1453   NITRITE POSITIVE (A) 05/18/2019 2252   LEUKOCYTESUR TRACE (A) 05/18/2019 2252   Sepsis Labs: @LABRCNTIP (procalcitonin:4,lacticidven:4)  ) Recent Results (from the past 240 hour(s))  Surgical pcr screen     Status: None  Collection Time: 05/25/19  2:07 PM   Specimen: Nasal Mucosa; Nasal Swab  Result Value Ref Range Status   MRSA, PCR NEGATIVE NEGATIVE Final   Staphylococcus aureus NEGATIVE NEGATIVE Final    Comment: (NOTE) The Xpert SA Assay (FDA approved for NASAL specimens in patients 52 years of age and older), is one component of a comprehensive surveillance program. It is not intended to diagnose infection nor to guide or monitor treatment. Performed at [facility], [address], [place], [place] [address]   Aerobic/Anaerobic Culture (surgical/deep wound)     Status: None   Collection Time: 05/25/19 10:08 PM   Specimen: Other Source; Body Fluid  Result Value Ref Range Status   Specimen Description FLUID LEFT SHOULDER  Final   Special Requests PATIENT ON FOLLOWING VANC  Final   Gram Stain NO WBC SEEN NO ORGANISMS SEEN   Final   Culture   Final    No growth  aerobically or anaerobically. Performed at [facility], [address], [place], [place] [address]    Report Status 05/30/2019 FINAL  Final  Aerobic/Anaerobic Culture (surgical/deep wound)     Status: None   Collection Time: 05/25/19 10:08 PM   Specimen: Other Source; Body Fluid  Result Value Ref Range Status   Specimen Description FLUID LEFT SHOULDER SUBACROMIAL  Final   Special Requests PATIENT ON FOLLOWING VANC  Final   Gram Stain   Final    RARE WBC PRESENT, PREDOMINANTLY MONONUCLEAR NO ORGANISMS SEEN    Culture   Final    No growth aerobically or anaerobically. Performed at [facility], [address] [place], [place] [address]    Report Status 05/30/2019 FINAL  Final  Aerobic/Anaerobic Culture (surgical/deep wound)     Status: None   Collection Time: 05/25/19 10:08 PM   Specimen: Other Source; Body Fluid  Result Value Ref Range Status   Specimen Description FLUID RIGHT SHOULDER  Final   Special Requests PATIENT ON FOLLOWING VANC  Final   Gram Stain   Final    ABUNDANT WBC PRESENT, PREDOMINANTLY PMN NO ORGANISMS SEEN    Culture   Final    RARE METHICILLIN RESISTANT STAPHYLOCOCCUS AUREUS CRITICAL RESULT CALLED TO, READ BACK BY AND VERIFIED WITH: RN J HAMZE [id number] AT 1328 BY CM NO ANAEROBES ISOLATED Performed at [facility], [address], [place], [place] [address]    Report Status 05/30/2019 FINAL  Final   Organism ID, Bacteria METHICILLIN RESISTANT STAPHYLOCOCCUS AUREUS  Final      Susceptibility   Methicillin resistant staphylococcus aureus - MIC*    CIPROFLOXACIN <=0.5 SENSITIVE Sensitive     ERYTHROMYCIN >=8 RESISTANT Resistant     GENTAMICIN <=0.5 SENSITIVE Sensitive     OXACILLIN >=4 RESISTANT Resistant     TETRACYCLINE <=1 SENSITIVE Sensitive     VANCOMYCIN 1 SENSITIVE Sensitive     TRIMETH/SULFA <=10 SENSITIVE Sensitive     CLINDAMYCIN >=8 RESISTANT Resistant     RIFAMPIN <=0.5 SENSITIVE Sensitive     Inducible Clindamycin NEGATIVE  Sensitive     * RARE METHICILLIN RESISTANT STAPHYLOCOCCUS AUREUS  Aerobic/Anaerobic Culture (surgical/deep wound)     Status: None   Collection Time: 05/25/19 10:27 PM   Specimen: Other Source; Body Fluid  Result Value Ref Range Status   Specimen Description FLUID RIGHT SHOULDER SUBACROMIAL  Final   Special Requests PATIENT ON FOLLOWING VANC  Final   Gram Stain   Final    RARE WBC  PRESENT,BOTH PMN AND MONONUCLEAR NO ORGANISMS SEEN    Culture   Final    No growth aerobically or anaerobically. Performed at [facility], [place] [address] [place], [place] [address]    Report Status 05/30/2019 FINAL  Final      Studies: No results found.  Scheduled Meds: . enoxaparin (LOVENOX) injection  40 mg Subcutaneous Q24H  . ferrous sulfate  325 mg Oral BID  . magnesium oxide  400 mg Oral BID  . methadone  15 mg Oral Daily  . prenatal multivitamin  1 tablet Oral Daily    Continuous Infusions: . lactated ringers 10 mL/hr at 05/24/19 0902  . vancomycin       LOS: 15 days     Kayleen Memos, MD [facility] Pager [phone]  If 7PM-7AM, please contact night-coverage [email or web address] Password [facility] 06/03/2019, 9:32 AM

## 2019-06-03 NOTE — Progress Notes (Signed)
Pharmacy Antibiotic Note  Christy Pearson is a 29 y.o. female  with MRSA bacteremia.  Pharmacy has been consulted for Vancomycin dosing.  Vancomycin 1/29 >>(3/12)  1/31: VP 21, VT 9, AUC 366 - increase to 1000mg  Q12 hrs 2/2: VP 29, VT 9, AUC 442 - continue  2/8: VP 37, VT 16, AUC 638 - dec to 750mg  q12h (expected AUC 478). 2/12: VP 23  VT 8  AUC 349   1/28 Ucx: ecoli 1/28 Bcx: MRSA 1/29 Bcx x 2: neg 1/29 resp panel - neg 2/4 R & L shoulder cx - MRSA    Plan: Increase Vancomycin 1000 mg IV q12h Est AUC 464    Height: 5' 7.01" (170.2 cm) Weight: 124 lb 9 oz (56.5 kg) IBW/kg (Calculated) : 61.62  Temp (24hrs), Avg:98.7 F (37.1 C), Min:98.1 F (36.7 C), Max:99.1 F (37.3 C)  Recent Labs  Lab 05/28/19 0934 05/30/19 0039 05/30/19 0854 06/02/19 1815 06/03/19 0403  WBC 6.7  --   --   --  4.3  CREATININE 0.61  --   --   --  0.49  VANCOTROUGH  --   --  16  --  8*  VANCOPEAK  --  37  --  23*  --     Estimated Creatinine Clearance: 95 mL/min (by C-G formula based on SCr of 0.49 mg/dL).    Allergies  Allergen Reactions  . Tylenol [Acetaminophen] Rash    Pt just reported tylenol allergy at this visit at 2338pm     Christy Pearson, Christy Pearson 06/03/2019 4:47 AM

## 2019-06-03 NOTE — Progress Notes (Signed)
Dear Doctor: Margo Aye This patient has been identified as a candidate for PICC  for the following reason (s): drug pH or osmolality (causing phlebitis, infiltration in 24 hours) If you agree, please write an order for the indicated device. For any questions contact the Vascular Access Team at [phone] if no answer, please leave a message.  Thank you for supporting the early vascular access assessment program.

## 2019-06-04 ENCOUNTER — Inpatient Hospital Stay: Payer: Self-pay

## 2019-06-04 DIAGNOSIS — M00012 Staphylococcal arthritis, left shoulder: Secondary | ICD-10-CM | POA: Diagnosis not present

## 2019-06-04 LAB — COMPREHENSIVE METABOLIC PANEL
ALT: 33 U/L (ref 0–44)
AST: 19 U/L (ref 15–41)
Albumin: 2.5 g/dL — ABNORMAL LOW (ref 3.5–5.0)
Alkaline Phosphatase: 156 U/L — ABNORMAL HIGH (ref 38–126)
Anion gap: 13 (ref 5–15)
BUN: 8 mg/dL (ref 6–20)
CO2: 23 mmol/L (ref 22–32)
Calcium: 9 mg/dL (ref 8.9–10.3)
Chloride: 104 mmol/L (ref 98–111)
Creatinine, Ser: 0.52 mg/dL (ref 0.44–1.00)
GFR calc Af Amer: >60 mL/min (ref >60)
GFR calc non Af Amer: >60 mL/min (ref >60)
Glucose, Bld: 87 mg/dL (ref 70–99)
Potassium: 4 mmol/L (ref 3.5–5.1)
Sodium: 140 mmol/L (ref 135–145)
Total Bilirubin: 0.6 mg/dL (ref 0.3–1.2)
Total Protein: 7.5 g/dL (ref 6.5–8.1)

## 2019-06-04 NOTE — Progress Notes (Signed)
Patient refusing to have PICC line placed at this time.

## 2019-06-04 NOTE — Progress Notes (Signed)
PROGRESS NOTE  Christy Pearson WPY:[id number] DOB: Dec 02, 1990 DOA: 05/18/2019 PCP: Patient, No Pcp Per  HPI/Recap of past 24 hours: The patient is a 29 yr old woman with a medical history significant for hepatitis C, IV heroin, crystal meth abuse, and kidney stones presented to the emergency room at [facility] with about 1 week of right flank pain.  She was found to have cavitary pulmonary lesions with septic emboli.  Blood cultures with MRSA. 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 Bilateral shoulder infection/ spinal infection.  06/04/19: Seen and examined.  No acute events overnight.  She has no new complaints.  Denies chest pain, dyspnea or palpitations.  No constipation.  No nausea.    Assessment/Plan: Principal Problem:   Septic arthritis of shoulder, left (HCC) Active Problems:   Injection of illicit drug within last 12 months   Hepatitis C antibody positive, s/p spontaneously cleared infection   Sepsis ([place])   Septic pulmonary embolism ([place])   Substance use disorder   Suspected endocarditis   MRSA bacteremia   IV drug abuse ([place])   Septic embolism (HCC)   Staphylococcal arthritis of right shoulder (HCC)   Staphylococcal arthritis of left wrist (HCC)   PFO (patent foramen ovale)   Endocarditis of tricuspid valve   Septic arthritis of shoulder, right (HCC)   Elevated LFTs  MRSA bacteremia with severe sepsis, tricuspid valve endocarditis, multilevel vertebral osteomyelitis and septic pulmonary emboli and bilateral septic shoulder arthritis: Sepsis improved.   Repeat blood cultures negative from 1/29. On IV vancomycin and followed by infectious disease.   She will need prolonged antibiotic therapy and will need to stay in the hospital as she is not outpatient therapy candidate.   Underwent arthrocentesis and debridement, 05/25/2019.  Followed by orthopedic surgery.  No growth so far.  Weightbearing and mobility. We will put orders to repeat blood  cultures if patient has temperature more than 101.  Clinically no evidence of new abscesses. ID recommended 6 weeks of antibiotics until 06/30/2019.  Will contact ID while finishing antibiotics if needs suppressive therapy.  Suspected right wrist septic arthritis: Clinically improved.    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  Neurosurgery has seen the patient.   Abnormal LFTs: From sepsis.  Liver ultrasound was benign.  Normalized.  E. coli UTI: Treated with ceftriaxone and finished therapy.  Anemia of chronic disease: 1 unit blood transfusion.  1 unit IV iron given.  H&H stable. Hg trending up to 8.7 from 8.0. c/w ferrous sulfate.  Polysubstance abuse/IV drug use: Counseled.  On methadone.  Stable. Patient wants to come off of all drugs before being discharged.  Isolated elevated alkaline phosphatase, unclear etiology Trending up to 163 from 134 AST ALT and total bilirubin normal. Continue to closely monitor.   DVT prophylaxis:  lovenox subcu daily. Code Status: Full Code Family Communication: Patient communicating with her family. Disposition Plan: Patient from home.  on active treatment for MRSA bacteremia and endocarditis. She is an active IV drug user, she is not safe for discharge to use outpatient antibiotic therapy.  Anticipate she will stay in the hospital for some time to finish her antibiotic therapy. Last dose of IV vancomycin 3/12.  Consultants   Infectious disease  Cardiology  Orthopedics Mardelle Matte)  Procedures   Transfusion of one unit of PRBC's.    Objective: Vitals:   06/02/19 2045 06/03/19 1643 06/03/19 2147 06/04/19 0546  BP: 115/85 117/89 101/72 110/80  Pulse: (!) 101 Marland Kitchen)  108 (!) 108 93  Resp: 18 18 20 16   Temp: 99.1 F (37.3 C) 97.7 F (36.5 C) 98.7 F (37.1 C) 98.6 F (37 C)  TempSrc: Oral Oral Oral Oral  SpO2: 100% 100% 100% 98%  Weight:      Height:        Intake/Output Summary  (Last 24 hours) at 06/04/2019 1117 Last data filed at 06/04/2019 0600 Gross per 24 hour  Intake 1064 ml  Output --  Net 1064 ml   Filed Weights   05/19/19 0400 05/24/19 0836 05/25/19 1728  Weight: 56.5 kg 56.5 kg 56.5 kg    Exam:  . General: 29 y.o. year-old female well-developed and nourished in no acute distress.  Alert and active.   . Cardiovascular: Regular rate and rhythm no rubs or gallops.   30 Respiratory: Clear to auscultation no wheezes no rales.   . Abdomen: Soft nontender normal bowel sounds present.   . Musculoskeletal: No lower extremity edema bilaterally. Marland Kitchen Psychiatry: Mood is appropriate for condition and setting.   Data Reviewed: CBC: Recent Labs  Lab 06/03/19 0403  WBC 4.3  NEUTROABS 2.1  HGB 8.7*  HCT 28.9*  MCV 84.0  PLT 335   Basic Metabolic Panel: Recent Labs  Lab 06/03/19 0403  NA 141  K 4.0  CL 105  CO2 26  GLUCOSE 98  BUN 9  CREATININE 0.49  CALCIUM 9.0  MG 2.0  PHOS 5.4*   GFR: Estimated Creatinine Clearance: 95 mL/min (by C-G formula based on SCr of 0.49 mg/dL). Liver Function Tests: Recent Labs  Lab 06/03/19 0403  AST 24  ALT 38  ALKPHOS 163*  BILITOT 0.3  PROT 6.9  ALBUMIN 2.2*   No results for input(s): LIPASE, AMYLASE in the last 168 hours. No results for input(s): AMMONIA in the last 168 hours. Coagulation Profile: No results for input(s): INR, PROTIME in the last 168 hours. Cardiac Enzymes: No results for input(s): CKTOTAL, CKMB, CKMBINDEX, TROPONINI in the last 168 hours. BNP (last 3 results) No results for input(s): PROBNP in the last 8760 hours. HbA1C: No results for input(s): HGBA1C in the last 72 hours. CBG: No results for input(s): GLUCAP in the last 168 hours. Lipid Profile: No results for input(s): CHOL, HDL, LDLCALC, TRIG, CHOLHDL, LDLDIRECT in the last 72 hours. Thyroid Function Tests: No results for input(s): TSH, T4TOTAL, FREET4, T3FREE, THYROIDAB in the last 72 hours. Anemia Panel: No results for  input(s): VITAMINB12, FOLATE, FERRITIN, TIBC, IRON, RETICCTPCT in the last 72 hours. Urine analysis:    Component Value Date/Time   COLORURINE YELLOW 05/18/2019 2252   APPEARANCEUR CLOUDY (A) 05/18/2019 2252   LABSPEC 1.020 05/18/2019 2252   PHURINE 5.5 05/18/2019 2252   GLUCOSEU NEGATIVE 05/18/2019 2252   HGBUR NEGATIVE 05/18/2019 2252   BILIRUBINUR MODERATE (A) 05/18/2019 2252   KETONESUR NEGATIVE 05/18/2019 2252   PROTEINUR 30 (A) 05/18/2019 2252   UROBILINOGEN 0.2 10/28/2015 1453   NITRITE POSITIVE (A) 05/18/2019 2252   LEUKOCYTESUR TRACE (A) 05/18/2019 2252   Sepsis Labs: @LABRCNTIP (procalcitonin:4,lacticidven:4)  ) Recent Results (from the past 240 hour(s))  Surgical pcr screen     Status: None   Collection Time: 05/25/19  2:07 PM   Specimen: Nasal Mucosa; Nasal Swab  Result Value Ref Range Status   MRSA, PCR NEGATIVE NEGATIVE Final   Staphylococcus aureus NEGATIVE NEGATIVE Final    Comment: (NOTE) The Xpert SA Assay (FDA approved for NASAL specimens in patients 38 years of age and older), is one component  of a comprehensive surveillance program. It is not intended to diagnose infection nor to guide or monitor treatment. Performed at [facility], [address] [place], [place] [address]   Aerobic/Anaerobic Culture (surgical/deep wound)     Status: None   Collection Time: 05/25/19 10:08 PM   Specimen: Other Source; Body Fluid  Result Value Ref Range Status   Specimen Description FLUID LEFT SHOULDER  Final   Special Requests PATIENT ON FOLLOWING VANC  Final   Gram Stain NO WBC SEEN NO ORGANISMS SEEN   Final   Culture   Final    No growth aerobically or anaerobically. Performed at [facility], [address], [place], [place] [address]    Report Status 05/30/2019 FINAL  Final  Aerobic/Anaerobic Culture (surgical/deep wound)     Status: None   Collection Time: 05/25/19 10:08 PM   Specimen: Other Source; Body Fluid  Result Value Ref Range Status     Specimen Description FLUID LEFT SHOULDER SUBACROMIAL  Final   Special Requests PATIENT ON FOLLOWING VANC  Final   Gram Stain   Final    RARE WBC PRESENT, PREDOMINANTLY MONONUCLEAR NO ORGANISMS SEEN    Culture   Final    No growth aerobically or anaerobically. Performed at [facility], [address], [place], [place] [address]    Report Status 05/30/2019 FINAL  Final  Aerobic/Anaerobic Culture (surgical/deep wound)     Status: None   Collection Time: 05/25/19 10:08 PM   Specimen: Other Source; Body Fluid  Result Value Ref Range Status   Specimen Description FLUID RIGHT SHOULDER  Final   Special Requests PATIENT ON FOLLOWING VANC  Final   Gram Stain   Final    ABUNDANT WBC PRESENT, PREDOMINANTLY PMN NO ORGANISMS SEEN    Culture   Final    RARE METHICILLIN RESISTANT STAPHYLOCOCCUS AUREUS CRITICAL RESULT CALLED TO, READ BACK BY AND VERIFIED WITH: RN J HAMZE [id number] AT 1328 BY CM NO ANAEROBES ISOLATED Performed at [facility], [address], [place], [place] [address]    Report Status 05/30/2019 FINAL  Final   Organism ID, Bacteria METHICILLIN RESISTANT STAPHYLOCOCCUS AUREUS  Final      Susceptibility   Methicillin resistant staphylococcus aureus - MIC*    CIPROFLOXACIN <=0.5 SENSITIVE Sensitive     ERYTHROMYCIN >=8 RESISTANT Resistant     GENTAMICIN <=0.5 SENSITIVE Sensitive     OXACILLIN >=4 RESISTANT Resistant     TETRACYCLINE <=1 SENSITIVE Sensitive     VANCOMYCIN 1 SENSITIVE Sensitive     TRIMETH/SULFA <=10 SENSITIVE Sensitive     CLINDAMYCIN >=8 RESISTANT Resistant     RIFAMPIN <=0.5 SENSITIVE Sensitive     Inducible Clindamycin NEGATIVE Sensitive     * RARE METHICILLIN RESISTANT STAPHYLOCOCCUS AUREUS  Aerobic/Anaerobic Culture (surgical/deep wound)     Status: None   Collection Time: 05/25/19 10:27 PM   Specimen: Other Source; Body Fluid  Result Value Ref Range Status   Specimen Description FLUID RIGHT SHOULDER SUBACROMIAL  Final   Special Requests  PATIENT ON FOLLOWING VANC  Final   Gram Stain   Final    RARE WBC PRESENT,BOTH PMN AND MONONUCLEAR NO ORGANISMS SEEN    Culture   Final    No growth aerobically or anaerobically. Performed at [facility], [address], [place], [place] [address]    Report Status 05/30/2019 FINAL  Final      Studies: [place] EKG SITE RITE  Result Date:  06/04/2019 If Site Rite image not attached, placement could not be confirmed due to current cardiac rhythm.   Scheduled Meds: . enoxaparin (LOVENOX) injection  40 mg Subcutaneous Q24H  . ferrous sulfate  325 mg Oral BID  . magnesium oxide  400 mg Oral BID  . methadone  15 mg Oral Daily  . prenatal multivitamin  1 tablet Oral Daily    Continuous Infusions: . lactated ringers 10 mL/hr at 05/24/19 0902  . vancomycin 1,000 mg (06/04/19 0544)     LOS: 16 days     Darlin Drop, MD [facility] Pager [phone]  If 7PM-7AM, please contact night-coverage [email or web address] Password [facility] 06/04/2019, 11:17 AM

## 2019-06-04 NOTE — Progress Notes (Signed)
Pt's pulse rate up slightly.  Auscultated heart sounds and heart rate is regular.  Will continue to assess.

## 2019-06-05 DIAGNOSIS — I079 Rheumatic tricuspid valve disease, unspecified: Secondary | ICD-10-CM | POA: Diagnosis not present

## 2019-06-05 DIAGNOSIS — M00012 Staphylococcal arthritis, left shoulder: Secondary | ICD-10-CM | POA: Diagnosis not present

## 2019-06-05 DIAGNOSIS — R768 Other specified abnormal immunological findings in serum: Secondary | ICD-10-CM | POA: Diagnosis not present

## 2019-06-05 DIAGNOSIS — F1990 Other psychoactive substance use, unspecified, uncomplicated: Secondary | ICD-10-CM | POA: Diagnosis not present

## 2019-06-05 LAB — COMPREHENSIVE METABOLIC PANEL
ALT: 29 U/L (ref 0–44)
AST: 19 U/L (ref 15–41)
Albumin: 2.6 g/dL — ABNORMAL LOW (ref 3.5–5.0)
Alkaline Phosphatase: 154 U/L — ABNORMAL HIGH (ref 38–126)
Anion gap: 12 (ref 5–15)
BUN: 9 mg/dL (ref 6–20)
CO2: 25 mmol/L (ref 22–32)
Calcium: 9.2 mg/dL (ref 8.9–10.3)
Chloride: 101 mmol/L (ref 98–111)
Creatinine, Ser: 0.56 mg/dL (ref 0.44–1.00)
GFR calc Af Amer: >60 mL/min (ref >60)
GFR calc non Af Amer: >60 mL/min (ref >60)
Glucose, Bld: 93 mg/dL (ref 70–99)
Potassium: 4.6 mmol/L (ref 3.5–5.1)
Sodium: 138 mmol/L (ref 135–145)
Total Bilirubin: 0.4 mg/dL (ref 0.3–1.2)
Total Protein: 7.5 g/dL (ref 6.5–8.1)

## 2019-06-05 MED ORDER — CARVEDILOL 3.125 MG PO TABS
3.1250 mg | ORAL_TABLET | Freq: Two times a day (BID) | ORAL | Status: DC
  Administered 2019-06-05 – 2019-06-30 (×50): 3.125 mg via ORAL
  Filled 2019-06-05 (×52): qty 1

## 2019-06-05 NOTE — Progress Notes (Signed)
PROGRESS NOTE  Christy Pearson WUJ:[id number] DOB: 04/08/1991 DOA: 05/18/2019 PCP: Patient, No Pcp Per  HPI/Recap of past 7 hours: 29 year old female with past medical history of polysubstance abuse including crystal meth and IV heroin IV with multiple complications including to hepatitis C with spontaneous clearance who presented to the emergency room on 1/28 with complaints of right flank pain x1 week.  Patient was admitted to hospitalist service after CT scan noted multiple pulmonary cavitary lesions felt to be from septic emboli.  Ensuing work-up revealed positive blood cultures for MRSA and a TEE noted tricuspid valve endocarditis and suspected PFO as well as bilateral shoulder and spine infection.  Following admission, sepsis stabilized.  Patient underwent arthrocentesis and debridement by orthopedic surgery on 2/4.  Seen by infectious disease and recommended to get 6 weeks of IV antibiotics, which will continue until 3/12.  Patient's hospital course also noted for blood transfusion and IV iron secondary to anemia of chronic disease.  Also had UTI which was treated.  Patient today doing okay.  No complaints.  Fatigued.  Assessment/Plan: Principal Problem: MRSA bacteremia secondary to IV drug use with severe sepsis and secondary tricuspid valve endocarditis, multilevel vertebral osteomyelitis and septic pulmonary emboli with secondary bilateral septic shoulder arthritis and suspected right wrist septic arthritis: Sepsis stabilized with IV fluids and ongoing IV antibiotics.  Repeat blood cultures negative from 1/29.  Continue IV vancomycin.  Followed by infectious disease.  Will need to stay in hospital until completion of 6 weeks of IV antibiotics, which will be March 12.  Status post arthrocentesis and debridement by orthopedic surgery 2/4.  Right wrist improving.   Active Problems:   Injection of illicit drug within last 12 months: Patient counseled and currently on methadone.  Plan is to be  off of all opiates before being discharged.  Suspected S1 nerve compression secondary to vertebral osteomyelitis: No neurological deficit.  Seen by neurosurgery and no intervention needed.  Anemia of chronic disease: Status post 1 unit blood transfusion as well as IV iron.  Hemoglobin stable, last checked and at 8.7 on 2/13.  E. coli UTI: Treated with IV Rocephin    Elevated LFTs  Mild transaminitis: Felt to be secondary to sepsis possible shock liver.  Liver ultrasound benign.  Transaminases have since normalized. Code Status: Full code  Family Communication: Declined for me to call family  Disposition Plan: Discharge home once completes IV antibiotics   Consultants:  Orthopedic surgery  Infectious disease  Cardiology  Procedures:  Transfusion 1 unit packed red blood cells  2D echocardiogram done 1/29: Moderate to severe tricuspid regurg with suspicions for tricuspid endocarditis although vegetation not seen. TEE done 1/29: Severe tricuspid valve regurg with moderately sized mobile tricuspid valve vegetation on 2 leaflets.  Preserved ejection fraction  Antimicrobials:  IV vancomycin 1/29-present  IV cefepime 1/29 only  P.o. amoxicillin 2/4 only  IV Levaquin 1/28-1/29  IV Rocephin 1/30-2/1  DVT prophylaxis: Lovenox   Objective: Vitals:   06/04/19 2053 06/05/19 0500  BP: 102/72 96/86  Pulse: (!) 109 96  Resp: 16 16  Temp: 98.1 F (36.7 C) 98.6 F (37 C)  SpO2: 100% 100%    Intake/Output Summary (Last 24 hours) at 06/05/2019 1419 Last data filed at 06/05/2019 0521 Gross per 24 hour  Intake 440 ml  Output --  Net 440 ml   Filed Weights   05/19/19 0400 05/24/19 0836 05/25/19 1728  Weight: 56.5 kg 56.5 kg 56.5 kg   Body mass index is 19.5 kg/m.  Exam:   General: Alert and oriented x3, no acute distress  Cardiovascular: Regular rate and rhythm, S1-S2  Respiratory: Clear to auscultation bilaterally  Abdomen: Soft, nontender, nondistended,  positive bowel sounds  Musculoskeletal: No clubbing or cyanosis or edema   Data Reviewed: CBC: Recent Labs  Lab 06/03/19 0403  WBC 4.3  NEUTROABS 2.1  HGB 8.7*  HCT 28.9*  MCV 84.0  PLT 528   Basic Metabolic Panel: Recent Labs  Lab 06/03/19 0403 06/04/19 1215  NA 141 140  K 4.0 4.0  CL 105 104  CO2 26 23  GLUCOSE 98 87  BUN 9 8  CREATININE 0.49 0.52  CALCIUM 9.0 9.0  MG 2.0  --   PHOS 5.4*  --    GFR: Estimated Creatinine Clearance: 95 mL/min (by C-G formula based on SCr of 0.52 mg/dL). Liver Function Tests: Recent Labs  Lab 06/03/19 0403 06/04/19 1215  AST 24 19  ALT 38 33  ALKPHOS 163* 156*  BILITOT 0.3 0.6  PROT 6.9 7.5  ALBUMIN 2.2* 2.5*   No results for input(s): LIPASE, AMYLASE in the last 168 hours. No results for input(s): AMMONIA in the last 168 hours. Coagulation Profile: No results for input(s): INR, PROTIME in the last 168 hours. Cardiac Enzymes: No results for input(s): CKTOTAL, CKMB, CKMBINDEX, TROPONINI in the last 168 hours. BNP (last 3 results) No results for input(s): PROBNP in the last 8760 hours. HbA1C: No results for input(s): HGBA1C in the last 72 hours. CBG: No results for input(s): GLUCAP in the last 168 hours. Lipid Profile: No results for input(s): CHOL, HDL, LDLCALC, TRIG, CHOLHDL, LDLDIRECT in the last 72 hours. Thyroid Function Tests: No results for input(s): TSH, T4TOTAL, FREET4, T3FREE, THYROIDAB in the last 72 hours. Anemia Panel: No results for input(s): VITAMINB12, FOLATE, FERRITIN, TIBC, IRON, RETICCTPCT in the last 72 hours. Urine analysis:    Component Value Date/Time   COLORURINE YELLOW 05/18/2019 2252   APPEARANCEUR CLOUDY (A) 05/18/2019 2252   LABSPEC 1.020 05/18/2019 2252   PHURINE 5.5 05/18/2019 2252   GLUCOSEU NEGATIVE 05/18/2019 2252   HGBUR NEGATIVE 05/18/2019 2252   BILIRUBINUR MODERATE (A) 05/18/2019 2252   KETONESUR NEGATIVE 05/18/2019 2252   PROTEINUR 30 (A) 05/18/2019 2252   UROBILINOGEN 0.2  10/28/2015 1453   NITRITE POSITIVE (A) 05/18/2019 2252   LEUKOCYTESUR TRACE (A) 05/18/2019 2252   Sepsis Labs: @LABRCNTIP (procalcitonin:4,lacticidven:4)  )No results found for this or any previous visit (from the past 240 hour(s)).    Studies: No results found.  Scheduled Meds: . enoxaparin (LOVENOX) injection  40 mg Subcutaneous Q24H  . ferrous sulfate  325 mg Oral BID  . magnesium oxide  400 mg Oral BID  . methadone  15 mg Oral Daily  . prenatal multivitamin  1 tablet Oral Daily    Continuous Infusions: . lactated ringers 10 mL/hr at 05/24/19 0902  . vancomycin 1,000 mg (06/05/19 0505)     LOS: 17 days     Annita Brod, MD [facility]   06/05/2019, 2:19 PM

## 2019-06-05 NOTE — Progress Notes (Signed)
     Subjective: s/p Procedure(s): ARTHROSCOPY SHOULDER  Patient is laying comfortably in bed. She reports shoulder pain to moderate. Right worse than left, worse with movement.  Denies chest pain, SOB, Calf pain. No nausea/vomiting. No other complaints.  Objective:   VITALS:   Vitals:   06/04/19 1424 06/04/19 1430 06/04/19 2053 06/05/19 0500  BP: 113/78  102/72 96/86  Pulse: (!) 113 (!) 113 (!) 109 96  Resp: 18  16 16   Temp: 98 F (36.7 C)  98.1 F (36.7 C) 98.6 F (37 C)  TempSrc: Oral  Oral Oral  SpO2: 99%  100% 100%  Weight:      Height:       Patient laying comfortably in bed. Bilateral upper extremities - bandages and steri-strips removed. Arthroscopic incisions healing well, no signs of erythema or edema around sites. Sutures in place. 0-80 degrees of forward flexion of right shoulder and 0-90 of left. Distal sensation is intact. No TTP to left wrist. Full ROM of left wrist.   Assessment/Plan: Principal Problem:   Septic arthritis of shoulder, left (HCC) Active Problems:   Injection of illicit drug within last 12 months   Hepatitis C antibody positive, s/p spontaneously cleared infection   Sepsis (HCC)   Septic pulmonary embolism (HCC)   Substance use disorder   Suspected endocarditis   MRSA bacteremia   IV drug abuse (HCC)   Septic embolism (HCC)   Staphylococcal arthritis of right shoulder (HCC)   Staphylococcal arthritis of left wrist (HCC)   PFO (patent foramen ovale)   Endocarditis of tricuspid valve   Septic arthritis of shoulder, right (HCC)   Elevated LFTs   11 Days Post-Op s/p Procedure(s): ARTHROSCOPY SHOULDER  Weightbearing: WBAT bilateral upper extremities Insicional and dressing care: Dressings and steristrips removed, all sutures removed today. New sterile dressings placed for hygiene, can removed in 2-3 days.  Showering: Ok to shower if sterile dressings in place  VTE prophylaxis: Lovenox daily Follow - up plan: Plan for continued stay  in hospital due to need to inpatient antibiotic treatment. Patient to follow-up with Dr. 2 weeks after discharge.  Contact information:  Weekdays 8-5 12-22-2005 PA-C [phone],  After hours and holidays please check [email or web address] for group call information for [facility]  [phone] 06/05/2019, 2:35 PM

## 2019-06-05 NOTE — Progress Notes (Signed)
Occupational Therapy Treatment Patient Details Name: Christy Pearson MRN: [id number] DOB: [birth year] Today's Date: 06/05/2019    History of present illness 29 yo female admitted with fevere chills and back pain. 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 PMH hepatitis C, Kidney stones, IV drug abuse,    OT comments  Pt verbalized UE exercies provided by previous OT, but refused to perform them. This OT provided pt with handouts for AAROM of R UE/shoulder in multiple planes. Pt reluctantly performed 2 of the exercises provided while standing up at sink counter in her room. Pt had stitches removed today. OT will continue to follow acutely  Follow Up Recommendations  No OT follow up;Follow surgeon's recommendation for DC plan and follow-up therapies    Equipment Recommendations  None recommended by OT    Recommendations for Other Services      Precautions / Restrictions Precautions Precautions: Fall Restrictions Weight Bearing Restrictions: No RUE Weight Bearing: Weight bearing as tolerated LUE Weight Bearing: Weight bearing as tolerated Other Position/Activity Restrictions: stitches removed from R shoulder today       Mobility Bed Mobility                  Transfers                      Balance                                           ADL either performed or assessed with clinical judgement   ADL                                               Vision       Perception     Praxis      Cognition                                                Exercises Other Exercises Other Exercises: pt verblazed exercies provided by previous OT, but refused to perform them. This OT provided pt with handouts for AAROM of R UE/shoulder in multiple planes.  Pt reluctantly performed 2 of the exercises provided while standing up at sink counter in her room   Shoulder Instructions       General Comments      Pertinent Vitals/ Pain          Home Living                                          Prior Functioning/Environment              Frequency  Min 3X/week        Progress Toward Goals  OT Goals(current goals can now be found in the care plan section)  Progress towards OT goals: OT to reassess next treatment  Acute Rehab OT Goals Patient Stated Goal: less pain and to move my  arms more  Plan Discharge plan remains appropriate    Co-evaluation                 AM-PAC OT "6 Clicks" Daily Activity     Outcome Measure   Help from another person eating meals?: None Help from another person taking care of personal grooming?: None Help from another person toileting, which includes using toliet, bedpan, or urinal?: A Little Help from another person bathing (including washing, rinsing, drying)?: A Little Help from another person to put on and taking off regular upper body clothing?: A Little Help from another person to put on and taking off regular lower body clothing?: A Little 6 Click Score: 20    End of Session    OT Visit Diagnosis: Muscle weakness (generalized) (M62.81);Pain Pain - Right/Left: Right Pain - part of body: Shoulder   Activity Tolerance Patient limited by pain   Patient Left Other (comment);with call bell/phone within reach(standing at sink)   Nurse Communication          Time: 6073-7106 OT Time Calculation (min): 16 min  Charges: OT Treatments $Therapeutic Exercise: 8-22 mins     Britt Bottom 06/05/2019, 3:29 PM

## 2019-06-06 DIAGNOSIS — F1990 Other psychoactive substance use, unspecified, uncomplicated: Secondary | ICD-10-CM | POA: Diagnosis not present

## 2019-06-06 DIAGNOSIS — I079 Rheumatic tricuspid valve disease, unspecified: Secondary | ICD-10-CM | POA: Diagnosis not present

## 2019-06-06 DIAGNOSIS — M00012 Staphylococcal arthritis, left shoulder: Secondary | ICD-10-CM | POA: Diagnosis not present

## 2019-06-06 DIAGNOSIS — R768 Other specified abnormal immunological findings in serum: Secondary | ICD-10-CM | POA: Diagnosis not present

## 2019-06-06 MED ORDER — DIPHENHYDRAMINE HCL 25 MG PO CAPS
25.0000 mg | ORAL_CAPSULE | Freq: Every evening | ORAL | Status: DC | PRN
  Administered 2019-06-06 – 2019-06-10 (×4): 25 mg via ORAL
  Filled 2019-06-06 (×5): qty 1

## 2019-06-06 MED ORDER — METHADONE HCL 5 MG PO TABS
12.5000 mg | ORAL_TABLET | Freq: Every day | ORAL | Status: DC
  Administered 2019-06-07 – 2019-06-10 (×4): 12.5 mg via ORAL
  Filled 2019-06-06 (×5): qty 1

## 2019-06-06 MED ORDER — IBUPROFEN 400 MG PO TABS
400.0000 mg | ORAL_TABLET | Freq: Four times a day (QID) | ORAL | Status: DC | PRN
  Administered 2019-06-06 – 2019-06-22 (×26): 400 mg via ORAL
  Filled 2019-06-06 (×28): qty 1

## 2019-06-06 MED ORDER — GABAPENTIN 600 MG PO TABS
300.0000 mg | ORAL_TABLET | Freq: Two times a day (BID) | ORAL | Status: DC
  Administered 2019-06-06 – 2019-06-30 (×49): 300 mg via ORAL
  Filled 2019-06-06 (×49): qty 1

## 2019-06-06 NOTE — Progress Notes (Signed)
Pharmacy Antibiotic Note  Christy Pearson is a 29 y.o. female  with MRSA bacteremia.  Pharmacy has been consulted for Vancomycin dosing.  Vancomycin 1/29 >>(3/12)  1/31: VP 21, VT 9, AUC 366 - increase to 1000mg  Q12 hrs 2/2: VP 29, VT 9, AUC 442 - continue  2/8: VP 37, VT 16, AUC 638 - dec to 750mg  q12h (expected AUC 478). 2/12: VP 23  VT 8  AUC 349  Plan: Continue Vancomycin 1000 mg IV q12h Est AUC 464 Will monitor BMET 2 times per week and get vanc levels as indicated.   Height: 5' 7.01" (170.2 cm) Weight: 124 lb 9 oz (56.5 kg) IBW/kg (Calculated) : 61.62  Temp (24hrs), Avg:98.6 F (37 C), Min:97.9 F (36.6 C), Max:99.3 F (37.4 C)  Recent Labs  Lab 06/02/19 1815 06/03/19 0403 06/04/19 1215 06/05/19 1500  WBC  --  4.3  --   --   CREATININE  --  0.49 0.52 0.56  VANCOTROUGH  --  8*  --   --   VANCOPEAK 23*  --   --   --     Estimated Creatinine Clearance: 95 mL/min (by C-G formula based on SCr of 0.56 mg/dL).    Allergies  Allergen Reactions  . Tylenol [Acetaminophen] Rash    Pt just reported tylenol allergy at this visit at 2338pm     06/06/19, PharmD, [facility] Clinical Pharmacist Please see AMION for all Pharmacists' Contact Phone Numbers 06/06/2019, 9:47 AM

## 2019-06-06 NOTE — Progress Notes (Signed)
     Subjective: 12 Days Post-Op s/p Procedure(s): ARTHROSCOPY SHOULDER   Patient is alert, oriented, and laying comfortably in bed. Patient reports pain as is mild. Pain is worse in right shoulder than left.    Objective:  PE: VITALS:   Vitals:   06/05/19 1447 06/05/19 2132 06/06/19 0519 06/06/19 1427  BP: 112/81 108/76 107/69 91/76  Pulse: (!) 115 (!) 108 98 100  Resp: 16 16 16 18   Temp: 98.5 F (36.9 C) 99.3 F (37.4 C) 97.9 F (36.6 C) 98.2 F (36.8 C)  TempSrc: Oral Axillary Oral Oral  SpO2: 100% 100% 99% 99%  Weight:      Height:       Patient laying comfortably in bed. Bilateral upper extremities - Sterile dressings removed and replaced. Arthroscopic incisions healing well, no signs of infection. 0-80 degrees of forward flexion of right shoulder and 0-90 of left. Distal sensation is intact. No TTP to left wrist. Full ROM of left wrist.  Assessment/Plan: Principal Problem:   Septic arthritis of shoulder, left (HCC) Active Problems:   Injection of illicit drug within last 12 months   Hepatitis C antibody positive, s/p spontaneously cleared infection   Sepsis (HCC)   Septic pulmonary embolism (HCC)   Substance use disorder   Suspected endocarditis   MRSA bacteremia   IV drug abuse (HCC)   Septic embolism (HCC)   Staphylococcal arthritis of right shoulder (HCC)   Staphylococcal arthritis of left wrist (HCC)   PFO (patent foramen ovale)   Endocarditis of tricuspid valve   Septic arthritis of shoulder, right (HCC)   Elevated LFTs    12 Days Post-Op s/p Procedure(s): ARTHROSCOPY SHOULDER  Weightbearing: WBAT bilateral upper extremities Insicional and dressing care: Sterile dressings removed and replaced. Continue PRN dressing changes for next few days for hygiene.  Showering: Ok to shower if sterile dressings in place  VTE prophylaxis: Lovenox daily Follow - up plan: Plan for continued stay in hospital due to need for inpatient antibiotic treatment.  Patient to follow-up with Dr. 2 weeks after discharge.  Contact information:  Weekdays 8-5 12-22-2005 PA-C [phone],  After hours and holidays please check [email or web address] for group call information for [facility]  [phone] 06/06/2019, 3:10 PM

## 2019-06-06 NOTE — Progress Notes (Signed)
PROGRESS NOTE  Lelar Farewell IDP:[id number] DOB: 03-Mar-1991 DOA: 05/18/2019 PCP: Patient, No Pcp Per  HPI/Recap of past 36 hours: 29 year old female with past medical history of polysubstance abuse including crystal meth and IV heroin IV with multiple complications including to hepatitis C with spontaneous clearance who presented to the emergency room on 1/28 with complaints of right flank pain x1 week.  Patient was admitted to hospitalist service after CT scan noted multiple pulmonary cavitary lesions felt to be from septic emboli.  Ensuing work-up revealed positive blood cultures for MRSA and a TEE noted tricuspid valve endocarditis and suspected PFO as well as bilateral shoulder and spine infection.  Following admission, sepsis stabilized.  Patient underwent arthrocentesis and debridement by orthopedic surgery on 2/4.  Seen by infectious disease and recommended to get 6 weeks of IV antibiotics, which will continue until 3/12.  Patient's hospital course also noted for blood transfusion and IV iron secondary to anemia of chronic disease.  Also had UTI which was treated.  PICC line placed for long-term IV antibiotics on 2/14.  Occasional chronic leg cramps.  She also complains of difficulty sleeping.  Assessment/Plan: Principal Problem: MRSA bacteremia secondary to IV drug use with severe sepsis and secondary tricuspid valve endocarditis, multilevel vertebral osteomyelitis and septic pulmonary emboli with secondary bilateral septic shoulder arthritis and suspected right wrist septic arthritis: Sepsis stabilized with IV fluids and ongoing IV antibiotics.  Repeat blood cultures negative from 1/29.  Continue IV vancomycin.  Followed by infectious disease.  Will need to stay in hospital until completion of 6 weeks of IV antibiotics, which will be March 12.  Status post arthrocentesis and debridement by orthopedic surgery 2/4.  Right wrist improving.   Active Problems:   Injection of illicit drug  within last 12 months: Patient counseled and currently on methadone.  Plan is to be off of all opiates before being discharged.  As per her request, have started slow wean from methadone, decreasing by 2.5 mg every 3 days.  Decreased from 15 down to 12.5 today.  Sleep disturbance: Requested melatonin which is not in stock.  She will try Benadryl.  Suspected S1 nerve compression secondary to vertebral osteomyelitis: No neurological deficit.  Seen by neurosurgery and no intervention needed.  Anemia of chronic disease: Status post 1 unit blood transfusion as well as IV iron.  Hemoglobin stable, last checked and at 8.7 on 2/13.  E. coli UTI: Treated with IV Rocephin    Elevated LFTs  Leg cramps: Has requested change from muscle relaxer to Neurontin.  We will try.  Mild transaminitis: Felt to be secondary to sepsis possible shock liver.  Liver ultrasound benign.  Transaminases have since normalized. Code Status: Full code  Family Communication: Declined for me to call family  Disposition Plan: Discharge home once completes IV antibiotics   Consultants:  Orthopedic surgery  Infectious disease  Cardiology  Procedures:  Transfusion 1 unit packed red blood cells  2D echocardiogram done 1/29: Moderate to severe tricuspid regurg with suspicions for tricuspid endocarditis although vegetation not seen.  TEE done 1/29: Severe tricuspid valve regurg with moderately sized mobile tricuspid valve vegetation on 2 leaflets.  Preserved ejection fraction  PICC line placed 2/14  Antimicrobials:  IV vancomycin 1/29-present  IV cefepime 1/29 only  P.o. amoxicillin 2/4 only  IV Levaquin 1/28-1/29  IV Rocephin 1/30-2/1  DVT prophylaxis: Lovenox   Objective: Vitals:   06/05/19 2132 06/06/19 0519  BP: 108/76 107/69  Pulse: (!) 108 98  Resp: 16 16  Temp: 99.3 F (37.4 C) 97.9 F (36.6 C)  SpO2: 100% 99%    Intake/Output Summary (Last 24 hours) at 06/06/2019 1421 Last data filed  at 06/06/2019 0800 Gross per 24 hour  Intake 628.17 ml  Output --  Net 628.17 ml   Filed Weights   05/19/19 0400 05/24/19 0836 05/25/19 1728  Weight: 56.5 kg 56.5 kg 56.5 kg   Body mass index is 19.5 kg/m.  Exam:   General: Alert and oriented x3, no acute distress  Cardiovascular: Regular rate and rhythm, S1-S2  Respiratory: Clear to auscultation bilaterally  Abdomen: Soft, nontender, nondistended, positive bowel sounds  Musculoskeletal: No clubbing or cyanosis or edema   Data Reviewed: CBC: Recent Labs  Lab 06/03/19 0403  WBC 4.3  NEUTROABS 2.1  HGB 8.7*  HCT 28.9*  MCV 84.0  PLT 335   Basic Metabolic Panel: Recent Labs  Lab 06/03/19 0403 06/04/19 1215 06/05/19 1500  NA 141 140 138  K 4.0 4.0 4.6  CL 105 104 101  CO2 26 23 25   GLUCOSE 98 87 93  BUN 9 8 9   CREATININE 0.49 0.52 0.56  CALCIUM 9.0 9.0 9.2  MG 2.0  --   --   PHOS 5.4*  --   --    GFR: Estimated Creatinine Clearance: 95 mL/min (by C-G formula based on SCr of 0.56 mg/dL). Liver Function Tests: Recent Labs  Lab 06/03/19 0403 06/04/19 1215 06/05/19 1500  AST 24 19 19   ALT 38 33 29  ALKPHOS 163* 156* 154*  BILITOT 0.3 0.6 0.4  PROT 6.9 7.5 7.5  ALBUMIN 2.2* 2.5* 2.6*   No results for input(s): LIPASE, AMYLASE in the last 168 hours. No results for input(s): AMMONIA in the last 168 hours. Coagulation Profile: No results for input(s): INR, PROTIME in the last 168 hours. Cardiac Enzymes: No results for input(s): CKTOTAL, CKMB, CKMBINDEX, TROPONINI in the last 168 hours. BNP (last 3 results) No results for input(s): PROBNP in the last 8760 hours. HbA1C: No results for input(s): HGBA1C in the last 72 hours. CBG: No results for input(s): GLUCAP in the last 168 hours. Lipid Profile: No results for input(s): CHOL, HDL, LDLCALC, TRIG, CHOLHDL, LDLDIRECT in the last 72 hours. Thyroid Function Tests: No results for input(s): TSH, T4TOTAL, FREET4, T3FREE, THYROIDAB in the last 72  hours. Anemia Panel: No results for input(s): VITAMINB12, FOLATE, FERRITIN, TIBC, IRON, RETICCTPCT in the last 72 hours. Urine analysis:    Component Value Date/Time   COLORURINE YELLOW 05/18/2019 2252   APPEARANCEUR CLOUDY (A) 05/18/2019 2252   LABSPEC 1.020 05/18/2019 2252   PHURINE 5.5 05/18/2019 2252   GLUCOSEU NEGATIVE 05/18/2019 2252   HGBUR NEGATIVE 05/18/2019 2252   BILIRUBINUR MODERATE (A) 05/18/2019 2252   KETONESUR NEGATIVE 05/18/2019 2252   PROTEINUR 30 (A) 05/18/2019 2252   UROBILINOGEN 0.2 10/28/2015 1453   NITRITE POSITIVE (A) 05/18/2019 2252   LEUKOCYTESUR TRACE (A) 05/18/2019 2252   Sepsis Labs: @LABRCNTIP (procalcitonin:4,lacticidven:4)  )No results found for this or any previous visit (from the past 240 hour(s)).    Studies: No results found.  Scheduled Meds: . carvedilol  3.125 mg Oral BID WC  . enoxaparin (LOVENOX) injection  40 mg Subcutaneous Q24H  . ferrous sulfate  325 mg Oral BID  . gabapentin  300 mg Oral BID  . magnesium oxide  400 mg Oral BID  . [START ON 06/07/2019] methadone  12.5 mg Oral Daily  . prenatal multivitamin  1 tablet Oral Daily    Continuous Infusions: .  lactated ringers 10 mL/hr at 05/24/19 0902  . vancomycin 1,000 mg (06/06/19 0600)     LOS: 18 days     Annita Brod, MD [facility]   06/06/2019, 2:21 PM

## 2019-06-07 DIAGNOSIS — R7881 Bacteremia: Secondary | ICD-10-CM | POA: Diagnosis not present

## 2019-06-07 DIAGNOSIS — F1990 Other psychoactive substance use, unspecified, uncomplicated: Secondary | ICD-10-CM | POA: Diagnosis not present

## 2019-06-07 DIAGNOSIS — B9562 Methicillin resistant Staphylococcus aureus infection as the cause of diseases classified elsewhere: Secondary | ICD-10-CM | POA: Diagnosis not present

## 2019-06-07 NOTE — Progress Notes (Signed)
     Subjective: 13 Days Post-Op s/p Procedure(s): ARTHROSCOPY SHOULDER   Patient is alert, oriented, sitting comfortably in chair. Denies pain in left shoulder, reports pain is mild in right shoulder. Patient very happy that she was able to shower and wash hair with left hand. Unable to reach top of her head with right hand today. No other complaints.  Objective:  PE: VITALS:   Vitals:   06/07/19 1044 06/07/19 1051 06/07/19 1139 06/07/19 1259  BP: 107/76   110/73  Pulse: (!) 123 (!) 120  (!) 116  Resp: 18 20  18   Temp: 99.4 F (37.4 C)  98.2 F (36.8 C) 98.2 F (36.8 C)  TempSrc: Oral  Oral Oral  SpO2: 99% 99%  100%  Weight:      Height:       Patient sitting up in chair.  Bilateral upper extremities - arthroscopic incisions healing well, no signs of erythema or edema around incision sites. 0-80 degrees of forward flexion of right shoulder and 0-90 of left.    LABS  No results found for this or any previous visit (from the past 24 hour(s)).  No results found.  Assessment/Plan: Principal Problem:   MRSA bacteremia Active Problems:   Injection of illicit drug within last 12 months   Hepatitis C antibody positive, s/p spontaneously cleared infection   Sepsis (HCC)   Septic pulmonary embolism (HCC)   Substance use disorder   Suspected endocarditis   IV drug abuse (HCC)   Septic embolism (HCC)   Staphylococcal arthritis of right shoulder (HCC)   Staphylococcal arthritis of left wrist (HCC)   PFO (patent foramen ovale)   Endocarditis of tricuspid valve   Septic arthritis of shoulder, left (HCC)   Septic arthritis of shoulder, right (HCC)   Elevated LFTs    13 Days Post-Op s/p Procedure(s): ARTHROSCOPY SHOULDER  Weightbearing:WBAT bilateral upper extremities. Patient has handout of shoulder exercises for ROM, counseled patient to keep working on these.  Insicional and dressing care:Continue PRN dressing changes.   Showering:Ok to shower VTE  prophylaxis:Lovenox daily Follow - up plan:Plan for continued stay in hospital due to need for inpatient antibiotic treatment. Patient to follow-up with Dr. 2 weeks after discharge.  Contact information: Weekdays 8-5 12-22-2005 [phone],  After hours and holidays please check [email or web address] for group call information for [facility]  [phone] 06/07/2019, 2:05 PM

## 2019-06-07 NOTE — Plan of Care (Signed)

## 2019-06-07 NOTE — Progress Notes (Signed)
Patient's HR elevated this morning 116-120. Patient is asymptomatic. Dr. Irene Limbo aware and currently at bedside. Will continue to monitor.

## 2019-06-07 NOTE — Progress Notes (Signed)
PROGRESS NOTE  Christy Pearson ZOX:[id number] DOB: 1991-03-22 DOA: 05/18/2019 PCP: Patient, No Pcp Per  Brief History   29 year old woman PMH including polysubstance abuse with heroin and crystal meth with multiple complications admitted for treatment of septic emboli, bacteremia, tricuspid valve endocarditis, bilateral shoulder and spine infection.  Underwent arthrocentesis and debridement by orthopedic surgery 2/4, seen by infectious disease with recommendation for 6 weeks IV antibiotics to complete 3/12.  A & P  MRSA bacteremia secondary to IV drug use, severe sepsis, tricuspid valve endocarditis, multilevel vertebral osteomyelitis, septic pulmonary emboli, bilateral septic shoulder arthritis and suspected right wrist septic arthritis.  Stabilized with fluids and antibiotics.  Status post arthrocentesis and debridement by orthopedic surgery 2/4. --Continue IV vancomycin per infectious disease, total 6 weeks to complete March 12.  Not a candidate for outpatient treatment.  IV drug use within last 12 months.  Patient requested wean off methadone. --Decreasing methadone 2.5 mg every 3 days.  Suspected S1 nerve compression secondary to vertebral osteomyelitis.  No neurologic deficit previously noted.  Seen by neurosurgery with no intervention recommended.  Anemia of chronic disease.  Status post 1 unit PRBC as well as IV iron.  Resolved Hospital Problem list    E. coli UTI  Mild transaminitis thought to be secondary to possible shock liver.   DVT prophylaxis: enoxaparin Code Status: Full Family Communication: none Disposition Plan: home    Brendia Sacks, MD  [facility] Direct contact: see [email or web address] (further directions at bottom of note if needed) 7PM-7AM contact night coverage as at bottom of note 06/07/2019, 1:21 PM  LOS: 19 days   Significant Hospital Events   .    Consultants:  Orthopedic surgery  Infectious disease  Cardiology  Procedures:  Transfusion 1  unit packed red blood cells  2D echocardiogram done 1/29: Moderate to severe tricuspid regurg with suspicions for tricuspid endocarditis although vegetation not seen.  TEE done 1/29: Severe tricuspid valve regurg with moderately sized mobile tricuspid valve vegetation on 2 leaflets.  Preserved ejection fraction  PICC line placed 2/14  Antimicrobials:  IV vancomycin 1/29-present  IV cefepime 1/29 only  P.o. amoxicillin 2/4 only  IV Levaquin 1/28-1/29  IV Rocephin 1/30-2/1  Interval History/Subjective  Feels okay.  Still not able to stand up straight secondary to back pain but no pain extending back while sitting.  Tolerating diet.  RN has noted mild tachycardia, otherwise seems to be doing well.  Objective   Vitals:  Vitals:   06/07/19 1139 06/07/19 1259  BP:  110/73  Pulse:  (!) 116  Resp:  18  Temp: 98.2 F (36.8 C) 98.2 F (36.8 C)  SpO2:  100%    Exam:  Constitutional.  Appears calm, comfortable.  Eating pizza for lunch. Respiratory.  Clear to auscultation bilaterally.  No wheezes, rales or rhonchi.  Normal respiratory effort. Cardiovascular.  Regular rate and rhythm.  No murmur, rub or gallop. Psychiatric.  Grossly normal mood and affect.  Speech fluent and appropriate.  I have personally reviewed the following:   Today's Data  . No new data  Scheduled Meds: . carvedilol  3.125 mg Oral BID WC  . enoxaparin (LOVENOX) injection  40 mg Subcutaneous Q24H  . ferrous sulfate  325 mg Oral BID  . gabapentin  300 mg Oral BID  . magnesium oxide  400 mg Oral BID  . methadone  12.5 mg Oral Daily  . prenatal multivitamin  1 tablet Oral Daily   Continuous Infusions: . lactated ringers 10 mL/hr at  05/24/19 0902  . vancomycin 1,000 mg (06/07/19 2633)    Principal Problem:   MRSA bacteremia Active Problems:   Injection of illicit drug within last 12 months   Hepatitis C antibody positive, s/p spontaneously cleared infection   Sepsis ([place])   Septic pulmonary  embolism ([place])   Substance use disorder   Suspected endocarditis   IV drug abuse ([place])   Septic embolism (HCC)   Staphylococcal arthritis of right shoulder (HCC)   Staphylococcal arthritis of left wrist (HCC)   PFO (patent foramen ovale)   Endocarditis of tricuspid valve   Septic arthritis of shoulder, left (HCC)   Septic arthritis of shoulder, right (HCC)   Elevated LFTs   LOS: 19 days   How to contact the [facility] Attending or Consulting provider [place] or covering provider during after hours 7P -7A, for this patient?  1. Check the care team in [facility] and look for a) attending/consulting [facility] provider listed and b) the [facility] team listed 2. Log into [email or web address] and use [place]'s universal password to access. If you do not have the password, please contact the hospital operator. 3. Locate the [facility] provider you are looking for under [facility] and page to a number that you can be directly reached. 4. If you still have difficulty reaching the provider, please page the [facility] (Director on Call) for the Hospitalists listed on amion for assistance.

## 2019-06-08 DIAGNOSIS — B9562 Methicillin resistant Staphylococcus aureus infection as the cause of diseases classified elsewhere: Secondary | ICD-10-CM | POA: Diagnosis not present

## 2019-06-08 DIAGNOSIS — R7881 Bacteremia: Secondary | ICD-10-CM | POA: Diagnosis not present

## 2019-06-08 LAB — BASIC METABOLIC PANEL
Anion gap: 12 (ref 5–15)
BUN: 11 mg/dL (ref 6–20)
CO2: 23 mmol/L (ref 22–32)
Calcium: 9.1 mg/dL (ref 8.9–10.3)
Chloride: 102 mmol/L (ref 98–111)
Creatinine, Ser: 0.6 mg/dL (ref 0.44–1.00)
GFR calc Af Amer: >60 mL/min (ref >60)
GFR calc non Af Amer: >60 mL/min (ref >60)
Glucose, Bld: 76 mg/dL (ref 70–99)
Potassium: 4.5 mmol/L (ref 3.5–5.1)
Sodium: 137 mmol/L (ref 135–145)

## 2019-06-08 LAB — VANCOMYCIN, TROUGH: Vancomycin Tr: 14 ug/mL — ABNORMAL LOW (ref 15–20)

## 2019-06-08 LAB — VANCOMYCIN, PEAK: Vancomycin Pk: 36 ug/mL (ref 30–40)

## 2019-06-08 MED ORDER — VANCOMYCIN HCL 750 MG/150ML IV SOLN
750.0000 mg | Freq: Two times a day (BID) | INTRAVENOUS | Status: DC
  Administered 2019-06-09 – 2019-06-18 (×19): 750 mg via INTRAVENOUS
  Filled 2019-06-08 (×20): qty 150

## 2019-06-08 NOTE — Progress Notes (Signed)
Pharmacy Antibiotic Note  Christy Pearson is a 29 y.o. female  with MRSA bacteremia.  Pharmacy has been consulted for Vancomycin dosing.  Afebrile, wbc normal, scr stable 0.6  Vancomycin 1/29 >>(3/12)  1/31: VP 21, VT 9, AUC 366 - increase to 1000mg  Q12 hrs 2/2: VP 29, VT 9, AUC 442 - continue  2/8: VP 37, VT 16, AUC 638 - dec to 750mg  q12h (expected AUC 478). 2/12: VP 23  VT 8  AUC 349 2/18: VP 36, VT 14 AUC 651  Plan: Reduce Vancomycin to 750 mg IV q12h (Evening dose already given, will start new regimen 2/19) Est AUC 487 Will monitor BMET 2 times per week and get vanc levels as indicated.   Height: 5' 7.01" (170.2 cm) Weight: 124 lb 9 oz (56.5 kg) IBW/kg (Calculated) : 61.62  Temp (24hrs), Avg:98.4 F (36.9 C), Min:98.1 F (36.7 C), Max:98.7 F (37.1 C)  Recent Labs  Lab 06/02/19 1815 06/03/19 0403 06/04/19 1215 06/05/19 1500 06/08/19 0819 06/08/19 1709  WBC  --  4.3  --   --   --   --   CREATININE  --  0.49 0.52 0.56  --  0.60  VANCOTROUGH  --  8*  --   --   --  14*  VANCOPEAK 23*  --   --   --  36  --     Estimated Creatinine Clearance: 95 mL/min (by C-G formula based on SCr of 0.6 mg/dL).    Allergies  Allergen Reactions  . Tylenol [Acetaminophen] Rash    Pt just reported tylenol allergy at this visit at 2338pm   06/10/19 PharmD., BCPS Clinical Pharmacist 06/08/2019 6:39 PM

## 2019-06-08 NOTE — Progress Notes (Signed)
OT Cancellation Note  Patient Details Name: Christy Pearson MRN: [id number] DOB: [birth year]   Cancelled Treatment:     Patient politely decline OT, eating lunch. Pt reports she has been performing the exercises from the handouts OT has given her. Will re-attempt as schedule allows.  Myrtie Neither OT OT office: [phone]   Carmelia Roller 06/08/2019, 1:48 PM

## 2019-06-08 NOTE — Progress Notes (Signed)
Nutrition Brief Note RD working remotely.  RD pulled to chart secondary to LOS (20 days).   Wt Readings from Last 15 Encounters:  05/25/19 56.5 kg  07/01/17 54.4 kg  12/30/15 56.2 kg  11/12/15 70.1 kg  10/28/15 68.5 kg  10/02/15 65.5 kg  02/03/13 57.6 kg  07/23/12 62.6 kg (67 %, Z= 0.45)*   * Growth percentiles are based on CDC (Girls, 2-20 Years) data.   29 year old woman PMH including polysubstance abuse with heroin and crystal meth with multiple complications admitted for treatment of septic emboli, bacteremia, tricuspid valve endocarditis, bilateral shoulder and spine infection.  Underwent arthrocentesis and debridement by orthopedic surgery 2/4, seen by infectious disease with recommendation for 6 weeks IV antibiotics to complete 3/12.  Reviewed I/O's: +960 ml x 24 hours and +5.1 L since 05/25/19  Attempted to speak with pt via phone, however, no answer.   Labs reviewed.   Current diet order is regular, patient is consuming approximately 100% of meals at this time. Labs and medications reviewed.   No nutrition interventions warranted at this time. If nutrition issues arise, please consult RD.   Levada Schilling, RD, LDN, CDCES Registered Dietitian II Certified Diabetes Care and Education Specialist Please refer to [facility] for RD and/or RD on-call/weekend/after hours pager

## 2019-06-08 NOTE — Progress Notes (Signed)
PROGRESS NOTE  Christy Pearson VZC:[id number] DOB: Nov 12, 1990 DOA: 05/18/2019 PCP: Patient, No Pcp Per  HPI/Recap of past 24 hours: Brief History   29 year old woman PMH including polysubstance abuse with heroin and crystal meth with multiple complications admitted for treatment of septic emboli, bacteremia, tricuspid valve endocarditis, bilateral shoulder and spine infection.  Underwent arthrocentesis and debridement by orthopedic surgery 2/4, seen by infectious disease with recommendation for 6 weeks IV antibiotics to complete 3/12.  2/18: She has no new complaints.  She wants to be left alone while she is sleeping.   Assessment/Plan: Principal Problem:   MRSA bacteremia Active Problems:   Injection of illicit drug within last 12 months   Hepatitis C antibody positive, s/p spontaneously cleared infection   Sepsis (HCC)   Septic pulmonary embolism (HCC)   Substance use disorder   Suspected endocarditis   IV drug abuse (HCC)   Septic embolism (HCC)   Staphylococcal arthritis of right shoulder (HCC)   Staphylococcal arthritis of left wrist (HCC)   PFO (patent foramen ovale)   Endocarditis of tricuspid valve   Septic arthritis of shoulder, left (HCC)   Septic arthritis of shoulder, right (HCC)   Elevated LFTs  MRSA bacteremia secondary to IV drug use, severe sepsis, tricuspid valve endocarditis, multilevel vertebral osteomyelitis, septic pulmonary emboli, bilateral septic shoulder arthritis and suspected right wrist septic arthritis.  Stabilized with fluids and antibiotics.  Status post R shoulder arthrocentesis and debridement by orthopedic surgery 2/4. --Continue IV vancomycin per infectious disease, total 6 weeks to complete March 12.  Not a candidate for outpatient treatment.  IV drug use within last 12 months.  Patient requested wean off methadone. --Decreasing methadone 2.5 mg every 3 days.  Suspected S1 nerve compression secondary to vertebral osteomyelitis.  No  neurologic deficit previously noted.  Seen by neurosurgery with no intervention recommended.  Anemia of chronic disease.  Status post 1 unit PRBC as well as IV iron.  Resolved Hospital Problem list   E. coli UTI  Mild transaminitis thought to be secondary to possible shock liver.   DVT prophylaxis: enoxaparin subcu daily Code Status: Full Family Communication: none Disposition Plan:  Patient is from home.  Anticipate discharge to home after completion of IV antibiotics on 06/30/2019.  Barrier to discharge: Requiring long-term IV antibiotics in IV drug abuser.      Consultants:  Orthopedic surgery  Infectious disease  Cardiology  Procedures:  Transfusion 1 unit packed red blood cells  2D echocardiogram done 1/29: Moderate to severe tricuspid regurg with suspicions for tricuspid endocarditis although vegetation not seen.  TEE done 1/29: Severe tricuspid valve regurg with moderately sized mobile tricuspid valve vegetation on 2 leaflets. Preserved ejection fraction  PICC line placed 2/14  Antimicrobials:  IV vancomycin 1/29-present  IV cefepime 1/29 only  P.o. amoxicillin 2/4 only  IV Levaquin 1/28-1/29  IV Rocephin 1/30-2/1     Objective: Vitals:   06/07/19 2040 06/07/19 2117 06/08/19 0040 06/08/19 0508  BP: 100/72 99/74 97/71  109/84  Pulse: (!) 106 (!) 107 (!) 104 (!) 105  Resp: 18 19 18 19   Temp: 98.7 F (37.1 C) 98.1 F (36.7 C) 98.4 F (36.9 C) 98.4 F (36.9 C)  TempSrc: Oral Oral Oral Oral  SpO2: 100% 100% 98% 100%  Weight:      Height:        Intake/Output Summary (Last 24 hours) at 06/08/2019 1055 Last data filed at 06/08/2019 0838 Gross per 24 hour  Intake 840 ml  Output --  Net 840 ml   Filed Weights   05/19/19 0400 05/24/19 0836 05/25/19 1728  Weight: 56.5 kg 56.5 kg 56.5 kg    Exam:  . General: 29 y.o. year-old female well developed well nourished in no acute distress.  Alert and oriented x3. . Cardiovascular:  Regular rate and rhythm with no rubs or gallops.  No thyromegaly or JVD noted.   Marland Kitchen Respiratory: Clear to auscultation with no wheezes or rales. Good inspiratory effort. . Abdomen: Soft nontender nondistended with normal bowel sounds x4 quadrants. . Musculoskeletal: No lower extremity edema. 2/4 pulses in all 4 extremities. Marland Kitchen Psychiatry: Mood is appropriate for condition and setting   Data Reviewed: CBC: Recent Labs  Lab 06/03/19 0403  WBC 4.3  NEUTROABS 2.1  HGB 8.7*  HCT 28.9*  MCV 84.0  PLT 335   Basic Metabolic Panel: Recent Labs  Lab 06/03/19 0403 06/04/19 1215 06/05/19 1500  NA 141 140 138  K 4.0 4.0 4.6  CL 105 104 101  CO2 26 23 25   GLUCOSE 98 87 93  BUN 9 8 9   CREATININE 0.49 0.52 0.56  CALCIUM 9.0 9.0 9.2  MG 2.0  --   --   PHOS 5.4*  --   --    GFR: Estimated Creatinine Clearance: 95 mL/min (by C-G formula based on SCr of 0.56 mg/dL). Liver Function Tests: Recent Labs  Lab 06/03/19 0403 06/04/19 1215 06/05/19 1500  AST 24 19 19   ALT 38 33 29  ALKPHOS 163* 156* 154*  BILITOT 0.3 0.6 0.4  PROT 6.9 7.5 7.5  ALBUMIN 2.2* 2.5* 2.6*   No results for input(s): LIPASE, AMYLASE in the last 168 hours. No results for input(s): AMMONIA in the last 168 hours. Coagulation Profile: No results for input(s): INR, PROTIME in the last 168 hours. Cardiac Enzymes: No results for input(s): CKTOTAL, CKMB, CKMBINDEX, TROPONINI in the last 168 hours. BNP (last 3 results) No results for input(s): PROBNP in the last 8760 hours. HbA1C: No results for input(s): HGBA1C in the last 72 hours. CBG: No results for input(s): GLUCAP in the last 168 hours. Lipid Profile: No results for input(s): CHOL, HDL, LDLCALC, TRIG, CHOLHDL, LDLDIRECT in the last 72 hours. Thyroid Function Tests: No results for input(s): TSH, T4TOTAL, FREET4, T3FREE, THYROIDAB in the last 72 hours. Anemia Panel: No results for input(s): VITAMINB12, FOLATE, FERRITIN, TIBC, IRON, RETICCTPCT in the last 72  hours. Urine analysis:    Component Value Date/Time   COLORURINE YELLOW 05/18/2019 2252   APPEARANCEUR CLOUDY (A) 05/18/2019 2252   LABSPEC 1.020 05/18/2019 2252   PHURINE 5.5 05/18/2019 2252   GLUCOSEU NEGATIVE 05/18/2019 2252   HGBUR NEGATIVE 05/18/2019 2252   BILIRUBINUR MODERATE (A) 05/18/2019 2252   KETONESUR NEGATIVE 05/18/2019 2252   PROTEINUR 30 (A) 05/18/2019 2252   UROBILINOGEN 0.2 10/28/2015 1453   NITRITE POSITIVE (A) 05/18/2019 2252   LEUKOCYTESUR TRACE (A) 05/18/2019 2252   Sepsis Labs: @LABRCNTIP (procalcitonin:4,lacticidven:4)  )No results found for this or any previous visit (from the past 240 hour(s)).    Studies: No results found.  Scheduled Meds: . carvedilol  3.125 mg Oral BID WC  . enoxaparin (LOVENOX) injection  40 mg Subcutaneous Q24H  . ferrous sulfate  325 mg Oral BID  . gabapentin  300 mg Oral BID  . magnesium oxide  400 mg Oral BID  . methadone  12.5 mg Oral Daily  . prenatal multivitamin  1 tablet Oral Daily    Continuous Infusions: . lactated ringers 10 mL/hr at  05/24/19 0902  . vancomycin 1,000 mg (06/08/19 0506)     LOS: 20 days     Kayleen Memos, MD [facility] Pager [phone]  If 7PM-7AM, please contact night-coverage [email or web address] Password [facility] 06/08/2019, 10:55 AM

## 2019-06-08 NOTE — Progress Notes (Signed)
     Subjective: 14 Days Post-Op s/p Procedure(s): ARTHROSCOPY SHOULDER  Patient is laying comfortably in bed on right side, sleeping on entrance. She denies any pain today. No changes overnight, no other complaints.  Objective:  PE: VITALS:   Vitals:   06/07/19 2040 06/07/19 2117 06/08/19 0040 06/08/19 0508  BP: 100/72 99/74 97/71  109/84  Pulse: (!) 106 (!) 107 (!) 104 (!) 105  Resp: 18 19 18 19   Temp: 98.7 F (37.1 C) 98.1 F (36.7 C) 98.4 F (36.9 C) 98.4 F (36.9 C)  TempSrc: Oral Oral Oral Oral  SpO2: 100% 100% 98% 100%  Weight:      Height:       Patient laying comfortably in bed, in no acute distress.  Bilateral upper extremities - No change in ROM bilaterally. arthroscopic incisions healing well, no signs of erythema or edema around sites. Distal sensation and capillary refill intact. No TTP to left wrist.   LABS  Results for orders placed or performed during the hospital encounter of 05/18/19 (from the past 24 hour(s))  Vancomycin, peak     Status: None   Collection Time: 06/08/19  8:19 AM  Result Value Ref Range   Vancomycin Pk 36 30 - 40 ug/mL    No results found.  Assessment/Plan: Principal Problem:   MRSA bacteremia Active Problems:   Injection of illicit drug within last 12 months   Hepatitis C antibody positive, s/p spontaneously cleared infection   Sepsis (HCC)   Septic pulmonary embolism (HCC)   Substance use disorder   Suspected endocarditis   IV drug abuse (HCC)   Septic embolism (HCC)   Staphylococcal arthritis of right shoulder (HCC)   Staphylococcal arthritis of left wrist (HCC)   PFO (patent foramen ovale)   Endocarditis of tricuspid valve   Septic arthritis of shoulder, left (HCC)   Septic arthritis of shoulder, right (HCC)   Elevated LFTs    14 Days Post-Op s/p Procedure(s): ARTHROSCOPY SHOULDER  Weightbearing:WBAT bilateral upper extremities. Patient should continue working on ROM exercises provided by OT, counseled patient.   Insicional and dressing care:Continue PRN dressing changes.  Showering:Ok to shower VTE prophylaxis:Lovenox daily Follow - up plan: Patient to follow-up with Dr. 06/10/19 2 weeks after discharge.  Planned continued stay in hospital until March 12 due to needforinpatient antibiotic treatment. Will continue to follow her progress intermittently during her hospital stay.   Contact information: Weekdays 8-5 12-22-2005 [phone],  After hours and holidays please check [email or web address] for group call information for [facility]  [phone] 06/08/2019, 10:08 AM

## 2019-06-09 DIAGNOSIS — B9562 Methicillin resistant Staphylococcus aureus infection as the cause of diseases classified elsewhere: Secondary | ICD-10-CM | POA: Diagnosis not present

## 2019-06-09 DIAGNOSIS — R7881 Bacteremia: Secondary | ICD-10-CM | POA: Diagnosis not present

## 2019-06-09 NOTE — TOC Initial Note (Signed)
Transition of Care [facility]) - Initial/Assessment Note    Patient Details  Name: Christy Pearson MRN: [id number] Date of Birth: 05/31/1990  [facility]) CM/SW Contact:    Alexander Mt, LCSW Phone Number: 06/09/2019, 3:17 PM  Clinical Narrative:                 CSW met with pt at bedside. Introduced self, role, reason for visit. Pt from home with relatives. Plan for discharge back to her address on facesheet, confirmed phone # listed, she does not currently have a PCP. Pt shares that she has tried several cessation programs, she would like to maintain some dose of methadone as she feels like her previous cessation efforts have been difficult when she doesn't have any supportive MAT.   Pt shares that there is a new methadone clinic in [place] that she will look into information about but worries that she wont be able to get in clinic unless she has insurance. Pt confirms financial counseling called, sounds like they were able to ask screening questions. Shared that this writer would provide her with information around Medicaid and disability applications of which she would like to apply for both.   Pt also shared that she has three minor children. She lost her husband recently and her brother; expressed my condolences. She has questions around how to figure out where the check her husband was receiving may have gone. Shared that likely she would need to contact the local social security administration office. Pt understanding, open to all resources. CSW to visit tomorrow 2/20 with additional resources.   Expected Discharge Plan: Home/Self Care Barriers to Discharge: Continued Medical Work up, Active Substance Use with PICC Line   Patient Goals and CMS Choice Patient states their goals for this hospitalization and ongoing recovery are:: to get home, continue methadone, get things sorted with Medicaid and disability      Expected Discharge Plan and Services Expected Discharge  Plan: Home/Self Care In-house Referral: Clinical Social Work, Development worker, community, PCP / Curator Services: CM Consult, Medication Assistance, MATCH Program, Follow-up appt scheduled Living arrangements for the past 2 months: Single Family Home    Prior Living Arrangements/Services Living arrangements for the past 2 months: Single Family Home Lives with:: Relatives Patient language and need for interpreter reviewed:: Yes(no needs) Do you feel safe going back to the place where you live?: Yes      Need for Family Participation in Patient Care: Yes (Comment)(support/assistance) Care giver support system in place?: Yes (comment)(relatives/family)   Criminal Activity/Legal Involvement Pertinent to Current Situation/Hospitalization: No - Comment as needed  Activities of Daily Living Home Assistive Devices/Equipment: None ADL Screening (condition at time of admission) Patient's cognitive ability adequate to safely complete daily activities?: Yes Is the patient deaf or have difficulty hearing?: No Does the patient have difficulty seeing, even when wearing glasses/contacts?: No Does the patient have difficulty concentrating, remembering, or making decisions?: No Patient able to express need for assistance with ADLs?: Yes Does the patient have difficulty dressing or bathing?: No Independently performs ADLs?: Yes (appropriate for developmental age) Does the patient have difficulty walking or climbing stairs?: No Weakness of Legs: None Weakness of Arms/Hands: None  Permission Sought/Granted Permission sought to share information with : Family Supports Permission granted to share information with : No(not at this time)   Emotional Assessment Appearance:: Appears stated age Attitude/Demeanor/Rapport: Engaged, Gracious Affect (typically observed): Accepting, Adaptable, Appropriate Orientation: : Oriented to Place, Oriented to Self, Oriented  to Situation, Oriented to   Time Alcohol / Substance Use: Tobacco Use, Illicit Drugs Psych Involvement: Outpatient Provider(previous engagement)  Admission diagnosis:  Pyelonephritis [N12] Septic embolism ([place]) [I76] IV drug abuse ([place]) [F19.10] Sepsis ([place]) [A41.9] Sepsis, due to unspecified organism, unspecified whether acute organ dysfunction present [facility]) [A41.9] Patient Active Problem List   Diagnosis Date Noted  . Elevated LFTs   . Septic arthritis of shoulder, left ([place]) 05/25/2019  . Septic arthritis of shoulder, right ([place]) 05/25/2019  . PFO (patent foramen ovale)   . Endocarditis of tricuspid valve   . MRSA bacteremia 05/22/2019  . IV drug abuse ([place])   . Septic embolism ([place])   . Staphylococcal arthritis of right shoulder ([place])   . Staphylococcal arthritis of left wrist (H[facility])   . Sepsis ([place]) 05/19/2019  . Septic pulmonary embolism ([place]) 05/19/2019  . Substance use disorder 05/19/2019  . Suspected endocarditis 05/19/2019  . Compliance poor 11/15/2015  . Hepatitis C antibody positive, s/p spontaneously cleared infection 10/28/2015  . History of RPR test + 10/28/2015  . Anemia affecting pregnancy in third trimester 10/28/2015  . Supervision of high risk pregnancy in third trimester 10/28/2015  . History of cesarean section 10/02/2015  . Injection of illicit drug within last 12 months 10/02/2015  . Dichorionic diamniotic twin gestation 10/02/2015  . Anemia affecting pregnancy 10/02/2015   PCP:  Patient, No Pcp Per Pharmacy:   [id number] - [place], [place] - [address] [place] [address] Phone: [phone] Fax: [phone]  [facility] [place] [place] - [address] [place] [address] Phone: [phone] Fax: [phone]   Readmission Risk Interventions Readmission Risk Prevention Plan 06/09/2019  Post Dischage Appt Not Complete  Appt Comments no PCP,  will need appointment at dc  Medication Screening Complete  Transportation Screening Complete  Some recent data might be hidden

## 2019-06-09 NOTE — Progress Notes (Signed)
Occupational Therapy Treatment Patient Details Name: Christy Pearson MRN: [id number] DOB: 05-Nov-1990 Today's Date: 06/09/2019    History of present illness 29 yo female admitted with fevere chills and back pain. 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 PMH hepatitis C, Kidney stones, IV drug abuse,    OT comments  Upon arrival patient in bathroom brushing and putting up her hair independently. Patient reports she has been able to dress herself independently "I want to do things for myself." Demonstrates donning sweatshirt without assistance. Patient has shown improvements in B shoulder AROM within functional use to perform self care tasks without assist. Patient also has hand outs in her room of exercises to perform to maximize AROM, patient demo/verbalize importance of keeping up with exercise for maximum function of UEs. Patient reports she will likely be in the hospital until March 12th for IV antibiotics.  OT will sign off services as patient demonstrates ability to perform exercises independently in her room. Please re-consult if there is a change in patient's status. Thank you.   Follow Up Recommendations  No OT follow up;Follow surgeon's recommendation for DC plan and follow-up therapies    Equipment Recommendations  None recommended by OT       Precautions / Restrictions Precautions Precautions: None Required Braces or Orthoses: Sling(for comfort) Restrictions RUE Weight Bearing: Weight bearing as tolerated LUE Weight Bearing: Weight bearing as tolerated       Mobility Bed Mobility               General bed mobility comments: OOB upon arrival  Transfers Overall transfer level: Independent Equipment used: None                  Balance Overall balance assessment: No apparent balance deficits (not  formally assessed)                                         ADL either performed or assessed with clinical judgement   ADL   Eating/Feeding: Independent   Grooming: Brushing hair;Independent Grooming Details (indicate cue type and reason): standing sink side to brush hair and put up in pony tail         Upper Body Dressing : Independent;Standing Upper Body Dressing Details (indicate cue type and reason): don sweatshirt, patient reports she independently put on tee shirt this morning     Toilet Transfer: Independent;Ambulation           Functional mobility during ADLs: Independent General ADL Comments: patient demonstrates independence with self care tasks, has been independent in her room for transfers, toileting               Cognition Arousal/Alertness: Awake/alert Behavior During Therapy: [facility] for tasks assessed/performed Overall Cognitive Status: Within Functional Limits for tasks assessed                                             Shoulder Instructions Shoulder Instructions Donning/doffing sling/immobilizer: Patient able to independently direct caregiver Correct positioning of sling/immobilizer: Independent Sling wearing schedule (on at all times/off for ADL's): Independent(for comfort )     General Comments patient has hand outs for shoulder exercises and can demonstrate  with adequate technique, encourage patient to keep up with exercises to progress AROM, patient verbalize understanding    Pertinent Vitals/ Pain       Pain Assessment: Faces Faces Pain Scale: Hurts little more Pain Location: R shoulder with movement Pain Descriptors / Indicators: Sore Pain Intervention(s): Limited activity within patient's tolerance      Progress Toward Goals  OT Goals(current goals can now be found in the care plan section)  Progress towards OT goals: Goals met/education completed, patient discharged from OT  ADL  Goals Pt/caregiver will Perform Home Exercise Program: Increased ROM;Both right and left upper extremity;With written HEP provided(goal met)  Plan All goals met and education completed, patient discharged from OT services       AM-PAC OT "6 Clicks" Daily Activity     Outcome Measure   Help from another person eating meals?: None Help from another person taking care of personal grooming?: None Help from another person toileting, which includes using toliet, bedpan, or urinal?: None Help from another person bathing (including washing, rinsing, drying)?: None Help from another person to put on and taking off regular upper body clothing?: None Help from another person to put on and taking off regular lower body clothing?: None 6 Click Score: 24    End of Session  OT Visit Diagnosis: Muscle weakness (generalized) (M62.81);Pain Pain - Right/Left: Right Pain - part of body: Shoulder   Activity Tolerance Patient tolerated treatment well   Patient Left Other (comment)(ambulating in hallway independently)           Time: 1200-1218 OT Time Calculation (min): 18 min  Charges: OT General Charges $OT Visit: 1 Visit OT Treatments $Self Care/Home Management : 8-22 mins  [place] OT office: [place] 06/09/2019, 12:52 PM

## 2019-06-09 NOTE — Progress Notes (Signed)
PROGRESS NOTE  Christy Pearson XFG:[id number] DOB: 12-Nov-1990 DOA: 05/18/2019 PCP: Patient, No Pcp Per  HPI/Recap of past 24 hours: Brief History   29 year old woman PMH including polysubstance abuse with heroin and crystal meth with multiple complications admitted for treatment of septic emboli, bacteremia, tricuspid valve endocarditis, bilateral shoulder and spine infection.  Underwent arthrocentesis and debridement by orthopedic surgery 2/4, seen by infectious disease with recommendation for 6 weeks IV antibiotics to complete 3/12.  2/19: Seen and examined.  No acute events overnight.  No new issues.   Assessment/Plan: Principal Problem:   MRSA bacteremia Active Problems:   Injection of illicit drug within last 12 months   Hepatitis C antibody positive, s/p spontaneously cleared infection   Sepsis ([place])   Septic pulmonary embolism (H[facility])   Substance use disorder   Suspected endocarditis   IV drug abuse ([place])   Septic embolism (HCC)   Staphylococcal arthritis of right shoulder (HCC)   Staphylococcal arthritis of left wrist (HCC)   PFO (patent foramen ovale)   Endocarditis of tricuspid valve   Septic arthritis of shoulder, left (HCC)   Septic arthritis of shoulder, right (HCC)   Elevated LFTs  MRSA bacteremia secondary to IV drug use, severe sepsis, tricuspid valve endocarditis, multilevel vertebral osteomyelitis, septic pulmonary emboli, bilateral septic shoulder arthritis and suspected right wrist septic arthritis.  Stabilized with fluids and antibiotics.  Status post R shoulder arthrocentesis and debridement by orthopedic surgery 2/4. --Continue IV vancomycin per infectious disease, total 6 weeks to complete March 12.  Not a candidate for outpatient treatment.  IV drug use within last 12 months.  Patient requested wean off methadone. --Decreasing methadone 2.5 mg every 3 days.  Suspected S1 nerve compression secondary to vertebral osteomyelitis.  No neurologic deficit  previously noted.  Seen by neurosurgery with no intervention recommended.  Anemia of chronic disease.  Status post 1 unit PRBC as well as IV iron.  Resolved Hospital Problem list   E. coli UTI  Mild transaminitis thought to be secondary to possible shock liver.   DVT prophylaxis: enoxaparin subcu daily Code Status: Full Family Communication: none Disposition Plan:  Patient is from home.  Anticipate discharge to home after completion of IV antibiotics on 06/30/2019.  Barrier to discharge: Requiring long-term IV antibiotics in IV drug abuser.      Consultants:  Orthopedic surgery  Infectious disease  Cardiology  Procedures:  Transfusion 1 unit packed red blood cells  2D echocardiogram done 1/29: Moderate to severe tricuspid regurg with suspicions for tricuspid endocarditis although vegetation not seen.  TEE done 1/29: Severe tricuspid valve regurg with moderately sized mobile tricuspid valve vegetation on 2 leaflets. Preserved ejection fraction  PICC line placed 2/14  Antimicrobials:  IV vancomycin 1/29-present  IV cefepime 1/29 only  P.o. amoxicillin 2/4 only  IV Levaquin 1/28-1/29  IV Rocephin 1/30-2/1     Objective: Vitals:   06/08/19 0508 06/08/19 1337 06/08/19 2221 06/09/19 0517  BP: 109/84 106/79 123/79 108/86  Pulse: (!) 105 (!) 110 (!) 108 97  Resp: 19 18 16 15   Temp: 98.4 F (36.9 C) 98.6 F (37 C) 98.3 F (36.8 C) 98.1 F (36.7 C)  TempSrc: Oral Oral Oral Oral  SpO2: 100% 100% 93% 100%  Weight:      Height:        Intake/Output Summary (Last 24 hours) at 06/09/2019 1236 Last data filed at 06/09/2019 1022 Gross per 24 hour  Intake 1260 ml  Output --  Net 1260 ml  Filed Weights   05/19/19 0400 05/24/19 0836 05/25/19 1728  Weight: 56.5 kg 56.5 kg 56.5 kg    Exam:  . General: 29 y.o. year-old female: Well-nourished no acute distress.  Alert oriented x3.   . Cardiovascular: Regular rate and rhythm no rubs or gallops.    Marland Kitchen Respiratory: Clear to auscultation with no wheezes or rales.  Good inspiratory effort. . Abdomen: Soft nontender normal bowel sounds present.   . Musculoskeletal: No lower extremity edema bilaterally. Psychiatry: Mood is appropriate for condition and setting.  Data Reviewed: CBC: Recent Labs  Lab 06/03/19 0403  WBC 4.3  NEUTROABS 2.1  HGB 8.7*  HCT 28.9*  MCV 84.0  PLT 335   Basic Metabolic Panel: Recent Labs  Lab 06/03/19 0403 06/04/19 1215 06/05/19 1500 06/08/19 1709  NA 141 140 138 137  K 4.0 4.0 4.6 4.5  CL 105 104 101 102  CO2 26 23 25 23   GLUCOSE 98 87 93 76  BUN 9 8 9 11   CREATININE 0.49 0.52 0.56 0.60  CALCIUM 9.0 9.0 9.2 9.1  MG 2.0  --   --   --   PHOS 5.4*  --   --   --    GFR: Estimated Creatinine Clearance: 95 mL/min (by C-G formula based on SCr of 0.6 mg/dL). Liver Function Tests: Recent Labs  Lab 06/03/19 0403 06/04/19 1215 06/05/19 1500  AST 24 19 19   ALT 38 33 29  ALKPHOS 163* 156* 154*  BILITOT 0.3 0.6 0.4  PROT 6.9 7.5 7.5  ALBUMIN 2.2* 2.5* 2.6*   No results for input(s): LIPASE, AMYLASE in the last 168 hours. No results for input(s): AMMONIA in the last 168 hours. Coagulation Profile: No results for input(s): INR, PROTIME in the last 168 hours. Cardiac Enzymes: No results for input(s): CKTOTAL, CKMB, CKMBINDEX, TROPONINI in the last 168 hours. BNP (last 3 results) No results for input(s): PROBNP in the last 8760 hours. HbA1C: No results for input(s): HGBA1C in the last 72 hours. CBG: No results for input(s): GLUCAP in the last 168 hours. Lipid Profile: No results for input(s): CHOL, HDL, LDLCALC, TRIG, CHOLHDL, LDLDIRECT in the last 72 hours. Thyroid Function Tests: No results for input(s): TSH, T4TOTAL, FREET4, T3FREE, THYROIDAB in the last 72 hours. Anemia Panel: No results for input(s): VITAMINB12, FOLATE, FERRITIN, TIBC, IRON, RETICCTPCT in the last 72 hours. Urine analysis:    Component Value Date/Time   COLORURINE  YELLOW 05/18/2019 2252   APPEARANCEUR CLOUDY (A) 05/18/2019 2252   LABSPEC 1.020 05/18/2019 2252   PHURINE 5.5 05/18/2019 2252   GLUCOSEU NEGATIVE 05/18/2019 2252   HGBUR NEGATIVE 05/18/2019 2252   BILIRUBINUR MODERATE (A) 05/18/2019 2252   KETONESUR NEGATIVE 05/18/2019 2252   PROTEINUR 30 (A) 05/18/2019 2252   UROBILINOGEN 0.2 10/28/2015 1453   NITRITE POSITIVE (A) 05/18/2019 2252   LEUKOCYTESUR TRACE (A) 05/18/2019 2252   Sepsis Labs: @LABRCNTIP (procalcitonin:4,lacticidven:4)  )No results found for this or any previous visit (from the past 240 hour(s)).    Studies: No results found.  Scheduled Meds: . carvedilol  3.125 mg Oral BID WC  . enoxaparin (LOVENOX) injection  40 mg Subcutaneous Q24H  . ferrous sulfate  325 mg Oral BID  . gabapentin  300 mg Oral BID  . magnesium oxide  400 mg Oral BID  . methadone  12.5 mg Oral Daily  . prenatal multivitamin  1 tablet Oral Daily    Continuous Infusions: . lactated ringers 10 mL/hr at 05/24/19 0902  . vancomycin 750  mg (06/09/19 0842)     LOS: 21 days     Darlin Drop, MD [facility] Pager [phone]  If 7PM-7AM, please contact night-coverage [email or web address] Password [facility] 06/09/2019, 12:36 PM

## 2019-06-10 DIAGNOSIS — B9562 Methicillin resistant Staphylococcus aureus infection as the cause of diseases classified elsewhere: Secondary | ICD-10-CM | POA: Diagnosis not present

## 2019-06-10 DIAGNOSIS — R7881 Bacteremia: Secondary | ICD-10-CM | POA: Diagnosis not present

## 2019-06-10 MED ORDER — METHADONE HCL 5 MG PO TABS
15.0000 mg | ORAL_TABLET | Freq: Every day | ORAL | Status: DC
  Administered 2019-06-11 – 2019-06-12 (×2): 15 mg via ORAL
  Filled 2019-06-10 (×2): qty 1

## 2019-06-10 NOTE — Progress Notes (Addendum)
PROGRESS NOTE  Christy Pearson PPI:[id number] DOB: 1991/02/03 DOA: 05/18/2019 PCP: Patient, No Pcp Per  HPI/Recap of past 24 hours: Brief History   29 year old woman PMH including polysubstance abuse with heroin and crystal meth with multiple complications admitted for treatment of septic emboli, bacteremia, tricuspid valve endocarditis, bilateral shoulder and spine infection.  Underwent arthrocentesis and debridement by orthopedic surgery 2/4, seen by infectious disease with recommendation for 6 weeks IV antibiotics to complete 3/12.  2/20: seen and examined.  States she has been sweating a lot at night for the last few days.  Requests that her methadone dose be restarted at her PTA dose of 15 mg daily.  No other issues.   Assessment/Plan: Principal Problem:   MRSA bacteremia Active Problems:   Injection of illicit drug within last 12 months   Hepatitis C antibody positive, s/p spontaneously cleared infection   Sepsis (HCC)   Septic pulmonary embolism (HCC)   Substance use disorder   Suspected endocarditis   IV drug abuse (HCC)   Septic embolism (HCC)   Staphylococcal arthritis of right shoulder (HCC)   Staphylococcal arthritis of left wrist (HCC)   PFO (patent foramen ovale)   Endocarditis of tricuspid valve   Septic arthritis of shoulder, left (HCC)   Septic arthritis of shoulder, right (HCC)   Elevated LFTs  MRSA bacteremia secondary to IV drug use, severe sepsis, tricuspid valve endocarditis, multilevel vertebral osteomyelitis, septic pulmonary emboli, bilateral septic shoulder arthritis and suspected right wrist septic arthritis.  Stabilized with fluids and antibiotics.  Status post R shoulder arthrocentesis and debridement by orthopedic surgery 2/4. --Continue IV vancomycin per infectious disease, total 6 weeks to complete March 12.  Not a candidate for outpatient treatment.  IV drug use within last 12 months.  Patient requested to increase dose from 12.5 mg daily to 15 mg  daily.  States that in her close environment friends and family are IVDAs.  Suspected S1 nerve compression secondary to vertebral osteomyelitis.  No neurologic deficit previously noted.  Seen by neurosurgery with no intervention recommended.  Anemia of chronic disease.  Status post 1 unit PRBC as well as IV iron.  Resolved Hospital Problem list   E. coli UTI  Mild transaminitis thought to be secondary to possible shock liver.   DVT prophylaxis: enoxaparin subcu daily Code Status: Full Family Communication: none Disposition Plan:  Patient is from home.  Anticipate discharge to home after completion of IV antibiotics on 06/30/2019.  Barrier to discharge: Requiring long-term IV antibiotics in IV drug abuser.      Consultants:  Orthopedic surgery  Infectious disease  Cardiology  Procedures:  Transfusion 1 unit packed red blood cells  2D echocardiogram done 1/29: Moderate to severe tricuspid regurg with suspicions for tricuspid endocarditis although vegetation not seen.  TEE done 1/29: Severe tricuspid valve regurg with moderately sized mobile tricuspid valve vegetation on 2 leaflets. Preserved ejection fraction  PICC line placed 2/14  Antimicrobials:  IV vancomycin 1/29-present  IV cefepime 1/29 only  P.o. amoxicillin 2/4 only  IV Levaquin 1/28-1/29  IV Rocephin 1/30-2/1     Objective: Vitals:   06/09/19 0517 06/09/19 1541 06/09/19 2009 06/10/19 0529  BP: 108/86 115/89 108/80 111/77  Pulse: 97 (!) 110 (!) 107 99  Resp: 15 16 16 16   Temp: 98.1 F (36.7 C) 98.3 F (36.8 C) 98.4 F (36.9 C) 98.7 F (37.1 C)  TempSrc: Oral Oral Oral Oral  SpO2: 100% 100% 93% 99%  Weight:      Height:  Intake/Output Summary (Last 24 hours) at 06/10/2019 1237 Last data filed at 06/10/2019 0930 Gross per 24 hour  Intake 1020 ml  Output --  Net 1020 ml   Filed Weights   05/19/19 0400 05/24/19 0836 05/25/19 1728  Weight: 56.5 kg 56.5 kg 56.5 kg     Exam:  . General: 29 y.o. year-old female: Well-nourished no acute distress.  Alert oriented x3.   . Cardiovascular: Regular rate and rhythm no rubs or gallops.   Marland Kitchen Respiratory: Clear to auscultation with no wheezes or rales.  Good inspiratory effort. . Abdomen: Soft nontender normal bowel sounds present.   . Musculoskeletal: No lower extremity edema bilaterally. Psychiatry: Mood is appropriate for condition and setting.  Data Reviewed: CBC: No results for input(s): WBC, NEUTROABS, HGB, HCT, MCV, PLT in the last 168 hours. Basic Metabolic Panel: Recent Labs  Lab 06/04/19 1215 06/05/19 1500 06/08/19 1709  NA 140 138 137  K 4.0 4.6 4.5  CL 104 101 102  CO2 23 25 23   GLUCOSE 87 93 76  BUN 8 9 11   CREATININE 0.52 0.56 0.60  CALCIUM 9.0 9.2 9.1   GFR: Estimated Creatinine Clearance: 95 mL/min (by C-G formula based on SCr of 0.6 mg/dL). Liver Function Tests: Recent Labs  Lab 06/04/19 1215 06/05/19 1500  AST 19 19  ALT 33 29  ALKPHOS 156* 154*  BILITOT 0.6 0.4  PROT 7.5 7.5  ALBUMIN 2.5* 2.6*   No results for input(s): LIPASE, AMYLASE in the last 168 hours. No results for input(s): AMMONIA in the last 168 hours. Coagulation Profile: No results for input(s): INR, PROTIME in the last 168 hours. Cardiac Enzymes: No results for input(s): CKTOTAL, CKMB, CKMBINDEX, TROPONINI in the last 168 hours. BNP (last 3 results) No results for input(s): PROBNP in the last 8760 hours. HbA1C: No results for input(s): HGBA1C in the last 72 hours. CBG: No results for input(s): GLUCAP in the last 168 hours. Lipid Profile: No results for input(s): CHOL, HDL, LDLCALC, TRIG, CHOLHDL, LDLDIRECT in the last 72 hours. Thyroid Function Tests: No results for input(s): TSH, T4TOTAL, FREET4, T3FREE, THYROIDAB in the last 72 hours. Anemia Panel: No results for input(s): VITAMINB12, FOLATE, FERRITIN, TIBC, IRON, RETICCTPCT in the last 72 hours. Urine analysis:    Component Value Date/Time    COLORURINE YELLOW 05/18/2019 2252   APPEARANCEUR CLOUDY (A) 05/18/2019 2252   LABSPEC 1.020 05/18/2019 2252   PHURINE 5.5 05/18/2019 2252   GLUCOSEU NEGATIVE 05/18/2019 2252   HGBUR NEGATIVE 05/18/2019 2252   BILIRUBINUR MODERATE (A) 05/18/2019 2252   KETONESUR NEGATIVE 05/18/2019 2252   PROTEINUR 30 (A) 05/18/2019 2252   UROBILINOGEN 0.2 10/28/2015 1453   NITRITE POSITIVE (A) 05/18/2019 2252   LEUKOCYTESUR TRACE (A) 05/18/2019 2252   Sepsis Labs: @LABRCNTIP (procalcitonin:4,lacticidven:4)  )No results found for this or any previous visit (from the past 240 hour(s)).    Studies: No results found.  Scheduled Meds: . carvedilol  3.125 mg Oral BID WC  . enoxaparin (LOVENOX) injection  40 mg Subcutaneous Q24H  . ferrous sulfate  325 mg Oral BID  . gabapentin  300 mg Oral BID  . magnesium oxide  400 mg Oral BID  . [START ON 06/11/2019] methadone  15 mg Oral Daily  . prenatal multivitamin  1 tablet Oral Daily    Continuous Infusions: . lactated ringers 10 mL/hr at 05/24/19 0902  . vancomycin 750 mg (06/10/19 0818)     LOS: 22 days     06/13/2019, MD [facility] Pager [phone]  If 7PM-7AM, please contact night-coverage [email or web address] Password [facility] 06/10/2019, 12:37 PM

## 2019-06-11 DIAGNOSIS — B9562 Methicillin resistant Staphylococcus aureus infection as the cause of diseases classified elsewhere: Secondary | ICD-10-CM | POA: Diagnosis not present

## 2019-06-11 DIAGNOSIS — R7881 Bacteremia: Secondary | ICD-10-CM | POA: Diagnosis not present

## 2019-06-11 LAB — VANCOMYCIN, PEAK: Vancomycin Pk: 26 ug/mL — ABNORMAL LOW (ref 30–40)

## 2019-06-11 MED ORDER — TRAZODONE HCL 50 MG PO TABS
50.0000 mg | ORAL_TABLET | Freq: Every day | ORAL | Status: AC
  Administered 2019-06-11: 50 mg via ORAL
  Filled 2019-06-11: qty 1

## 2019-06-11 NOTE — Progress Notes (Signed)
     Subjective: 17 Days Post-Op s/p Procedure(s): ARTHROSCOPY SHOULDER  Patient is alert, oriented, laying in bed. She reports mild pain in right shoulder, none in left. She is excited by increase in ROM of bilateral shoulders and ability to touch top of head with both hands. Mood and cooperation with visit is significantly improved since beginning of hospital stay.   Objective:  PE: VITALS:   Vitals:   06/10/19 0529 06/10/19 1530 06/10/19 1956 06/11/19 0649  BP: 111/77 111/74 104/74 106/72  Pulse: 99 98 (!) 110 95  Resp: 16 16 17 14   Temp: 98.7 F (37.1 C) 98.3 F (36.8 C) 98.5 F (36.9 C) 98 F (36.7 C)  TempSrc: Oral Oral Oral Oral  SpO2: 99% 100% 100% 100%  Weight:      Height:       General: Patient laying comfortably in bed, in no acute distress Abdomen: Soft, non-tender LUE: All arthroscopic incisions have healed. No edema or erythema around incision sites. AROM 0-100 degrees of forward flexion. Full ROM of elbow, wrist, and hand. No TTP to left shoulder. Distal sensation and capillary refill intact.  RUE: All arthroscopic incisions have healed. No edema or erythema around incision sites. AROM 0-90 degrees of forward flexion. Full ROM of elbow, wrist, and hand. Mild TTP to right shoulder. Distal sensation and capillary refill intact.   LABS  No results found for this or any previous visit (from the past 24 hour(s)).  No results found.  Assessment/Plan: Principal Problem:   MRSA bacteremia Active Problems:   Injection of illicit drug within last 12 months   Hepatitis C antibody positive, s/p spontaneously cleared infection   Sepsis (HCC)   Septic pulmonary embolism (HCC)   Substance use disorder   Suspected endocarditis   IV drug abuse (HCC)   Septic embolism (HCC)   Staphylococcal arthritis of right shoulder (HCC)   Staphylococcal arthritis of left wrist (HCC)   PFO (patent foramen ovale)   Endocarditis of tricuspid valve   Septic arthritis of shoulder,  left (HCC)   Septic arthritis of shoulder, right (HCC)   Elevated LFTs    17 Days Post-Op s/p Procedure(s): ARTHROSCOPY SHOULDER  Weightbearing:WBAT bilateral upper extremities. Patient to continue to work on ROM of bilateral shoulders. Insicional and dressing care:Does not need shoulder dressings. Showering:Ok to shower VTE prophylaxis:Lovenox daily Follow - up plan: Patient to follow-up with Dr. 2 weeks after discharge.  Planned continued stay in hospital until March 12 due to needforinpatient antibiotic treatment. Will continue to follow her progress intermittently during her hospital stay.   Contact information:   Weekdays 8-5 12-22-2005, Janine Ores [place] A fter hours and holidays please check [email or web address] for group call information for [facility]  [phone] 06/11/2019, 11:05 AM

## 2019-06-11 NOTE — Progress Notes (Signed)
PROGRESS NOTE  Christy Pearson UMP:[id number] DOB: 1990-12-11 DOA: 05/18/2019 PCP: Patient, No Pcp Per  HPI/Recap of past 24 hours: Brief History   29 year old woman PMH including polysubstance abuse with heroin and crystal meth with multiple complications admitted for treatment of septic emboli, bacteremia, tricuspid valve endocarditis, bilateral shoulder and spine infection.  Underwent arthrocentesis and debridement by orthopedic surgery 2/4, seen by infectious disease with recommendation for 6 weeks IV antibiotics to complete 3/12.  2/21: Seen and examined.  No new complaints.   Assessment/Plan: Principal Problem:   MRSA bacteremia Active Problems:   Injection of illicit drug within last 12 months   Hepatitis C antibody positive, s/p spontaneously cleared infection   Sepsis (HCC)   Septic pulmonary embolism (HCC)   Substance use disorder   Suspected endocarditis   IV drug abuse (HCC)   Septic embolism (HCC)   Staphylococcal arthritis of right shoulder (HCC)   Staphylococcal arthritis of left wrist (HCC)   PFO (patent foramen ovale)   Endocarditis of tricuspid valve   Septic arthritis of shoulder, left (HCC)   Septic arthritis of shoulder, right (HCC)   Elevated LFTs  MRSA bacteremia secondary to IV drug use, severe sepsis, tricuspid valve endocarditis, multilevel vertebral osteomyelitis, septic pulmonary emboli, bilateral septic shoulder arthritis and suspected right wrist septic arthritis.  Stabilized with fluids and antibiotics.  Status post R shoulder arthrocentesis and debridement by orthopedic surgery 2/4. --Continue IV vancomycin per infectious disease, total 6 weeks to complete March 12.  Not a candidate for outpatient treatment.  IV drug use within last 12 months.  Patient requested to increase dose from 12.5 mg daily to 15 mg daily.  States that in her close environment friends and family are IVDAs.  Suspected S1 nerve compression secondary to vertebral  osteomyelitis.  No neurologic deficit previously noted.  Seen by neurosurgery with no intervention recommended.  Anemia of chronic disease.  Status post 1 unit PRBC as well as IV iron.  Resolved Hospital Problem list   E. coli UTI  Mild transaminitis thought to be secondary to possible shock liver.   DVT prophylaxis: enoxaparin subcu daily Code Status: Full Family Communication: none Disposition Plan:  Patient is from home.  Anticipate discharge to home after completion of IV antibiotics on 06/30/2019.  Barrier to discharge: Requiring long-term IV antibiotics in IV drug abuser.      Consultants:  Orthopedic surgery  Infectious disease  Cardiology  Procedures:  Transfusion 1 unit packed red blood cells  2D echocardiogram done 1/29: Moderate to severe tricuspid regurg with suspicions for tricuspid endocarditis although vegetation not seen.  TEE done 1/29: Severe tricuspid valve regurg with moderately sized mobile tricuspid valve vegetation on 2 leaflets. Preserved ejection fraction  PICC line placed 2/14  Antimicrobials:  IV vancomycin 1/29-present  IV cefepime 1/29 only  P.o. amoxicillin 2/4 only  IV Levaquin 1/28-1/29  IV Rocephin 1/30-2/1     Objective: Vitals:   06/10/19 0529 06/10/19 1530 06/10/19 1956 06/11/19 0649  BP: 111/77 111/74 104/74 106/72  Pulse: 99 98 (!) 110 95  Resp: 16 16 17 14   Temp: 98.7 F (37.1 C) 98.3 F (36.8 C) 98.5 F (36.9 C) 98 F (36.7 C)  TempSrc: Oral Oral Oral Oral  SpO2: 99% 100% 100% 100%  Weight:      Height:        Intake/Output Summary (Last 24 hours) at 06/11/2019 1044 Last data filed at 06/10/2019 1400 Gross per 24 hour  Intake 300 ml  Output --  Net 300 ml   Filed Weights   05/19/19 0400 05/24/19 0836 05/25/19 1728  Weight: 56.5 kg 56.5 kg 56.5 kg    Exam:  . General: 29 y.o. year-old female: Well-nourished no acute distress.  Alert oriented x3.   . Cardiovascular: Regular rate and  rhythm no rubs or gallops.   Marland Kitchen Respiratory: Clear to auscultation with no wheezes or rales.  Good inspiratory effort. . Abdomen: Soft nontender normal bowel sounds present.   . Musculoskeletal: No lower extremity edema bilaterally. Psychiatry: Mood is appropriate for condition and setting.  Data Reviewed: CBC: No results for input(s): WBC, NEUTROABS, HGB, HCT, MCV, PLT in the last 168 hours. Basic Metabolic Panel: Recent Labs  Lab 06/04/19 1215 06/05/19 1500 06/08/19 1709  NA 140 138 137  K 4.0 4.6 4.5  CL 104 101 102  CO2 23 25 23   GLUCOSE 87 93 76  BUN 8 9 11   CREATININE 0.52 0.56 0.60  CALCIUM 9.0 9.2 9.1   GFR: Estimated Creatinine Clearance: 95 mL/min (by C-G formula based on SCr of 0.6 mg/dL). Liver Function Tests: Recent Labs  Lab 06/04/19 1215 06/05/19 1500  AST 19 19  ALT 33 29  ALKPHOS 156* 154*  BILITOT 0.6 0.4  PROT 7.5 7.5  ALBUMIN 2.5* 2.6*   No results for input(s): LIPASE, AMYLASE in the last 168 hours. No results for input(s): AMMONIA in the last 168 hours. Coagulation Profile: No results for input(s): INR, PROTIME in the last 168 hours. Cardiac Enzymes: No results for input(s): CKTOTAL, CKMB, CKMBINDEX, TROPONINI in the last 168 hours. BNP (last 3 results) No results for input(s): PROBNP in the last 8760 hours. HbA1C: No results for input(s): HGBA1C in the last 72 hours. CBG: No results for input(s): GLUCAP in the last 168 hours. Lipid Profile: No results for input(s): CHOL, HDL, LDLCALC, TRIG, CHOLHDL, LDLDIRECT in the last 72 hours. Thyroid Function Tests: No results for input(s): TSH, T4TOTAL, FREET4, T3FREE, THYROIDAB in the last 72 hours. Anemia Panel: No results for input(s): VITAMINB12, FOLATE, FERRITIN, TIBC, IRON, RETICCTPCT in the last 72 hours. Urine analysis:    Component Value Date/Time   COLORURINE YELLOW 05/18/2019 2252   APPEARANCEUR CLOUDY (A) 05/18/2019 2252   LABSPEC 1.020 05/18/2019 2252   PHURINE 5.5 05/18/2019 2252     GLUCOSEU NEGATIVE 05/18/2019 2252   HGBUR NEGATIVE 05/18/2019 2252   BILIRUBINUR MODERATE (A) 05/18/2019 2252   KETONESUR NEGATIVE 05/18/2019 2252   PROTEINUR 30 (A) 05/18/2019 2252   UROBILINOGEN 0.2 10/28/2015 1453   NITRITE POSITIVE (A) 05/18/2019 2252   LEUKOCYTESUR TRACE (A) 05/18/2019 2252   Sepsis Labs: @LABRCNTIP (procalcitonin:4,lacticidven:4)  )No results found for this or any previous visit (from the past 240 hour(s)).    Studies: No results found.  Scheduled Meds: . carvedilol  3.125 mg Oral BID WC  . enoxaparin (LOVENOX) injection  40 mg Subcutaneous Q24H  . ferrous sulfate  325 mg Oral BID  . gabapentin  300 mg Oral BID  . magnesium oxide  400 mg Oral BID  . methadone  15 mg Oral Daily  . prenatal multivitamin  1 tablet Oral Daily    Continuous Infusions: . lactated ringers 10 mL/hr at 05/24/19 0902  . vancomycin 750 mg (06/11/19 1029)     LOS: 23 days     Kayleen Memos, MD [facility] Pager [phone]  If 7PM-7AM, please contact night-coverage [email or web address] Password [facility] 06/11/2019, 10:44 AM

## 2019-06-12 DIAGNOSIS — B9562 Methicillin resistant Staphylococcus aureus infection as the cause of diseases classified elsewhere: Secondary | ICD-10-CM | POA: Diagnosis not present

## 2019-06-12 DIAGNOSIS — R7881 Bacteremia: Secondary | ICD-10-CM | POA: Diagnosis not present

## 2019-06-12 LAB — VANCOMYCIN, PEAK
Vancomycin Pk: 10 ug/mL — ABNORMAL LOW (ref 30–40)
Vancomycin Pk: 27 ug/mL — ABNORMAL LOW (ref 30–40)

## 2019-06-12 MED ORDER — DIPHENHYDRAMINE HCL 25 MG PO CAPS
25.0000 mg | ORAL_CAPSULE | Freq: Every evening | ORAL | Status: DC | PRN
  Administered 2019-06-12 – 2019-06-13 (×2): 25 mg via ORAL
  Filled 2019-06-12 (×3): qty 1

## 2019-06-12 MED ORDER — METHADONE HCL 10 MG PO TABS
20.0000 mg | ORAL_TABLET | Freq: Every day | ORAL | Status: DC
  Administered 2019-06-13 – 2019-06-30 (×18): 20 mg via ORAL
  Filled 2019-06-12 (×18): qty 2

## 2019-06-12 MED ORDER — TRAZODONE HCL 50 MG PO TABS
50.0000 mg | ORAL_TABLET | Freq: Every evening | ORAL | Status: AC | PRN
  Administered 2019-06-12: 50 mg via ORAL
  Filled 2019-06-12: qty 1

## 2019-06-12 NOTE — Progress Notes (Signed)
Pharmacy Antibiotic Note  Christy Pearson is a 29 y.o. female  with MRSA bacteremia.  Pharmacy has been consulted for Vancomycin dosing.  Last Scr 0.6 (2/18)  Vancomycin 1/29 >>(3/12)  Vancomycin dosing changes during admission: 1/31: VP 21, VT 9, AUC 366 - increase to 1000mg  Q12 hrs 2/2: VP 29, VT 9, AUC 442 - continue  2/8: VP 37, VT 16, AUC 638 - dec to 750mg  q12h (expected AUC 478). 2/12: VP 23  VT 8  AUC 349 2/18: VP 36, VT 14 AUC 651  2/22: Pt on 750mg  IV q12h.  Vancomycin peak 27 Vancomycin trough 10.  Calculated AUC 405 (goal 400-550).   Plan: Continue Vancomycin 750 mg IV q12h BMET in a.m. and twice weekly. Vanc levels as indicated.   Height: 5' 7.01" (170.2 cm) Weight: 124 lb 9 oz (56.5 kg) IBW/kg (Calculated) : 61.62  Temp (24hrs), Avg:98.2 F (36.8 C), Min:97.8 F (36.6 C), Max:98.5 F (36.9 C)  Recent Labs  Lab 06/08/19 1709 06/11/19 2304 06/12/19 1931 06/12/19 2212  CREATININE 0.60  --   --   --   VANCOTROUGH 14*  --   --   --   VANCOPEAK  --    < > 10* 27*   < > = values in this interval not displayed.    Estimated Creatinine Clearance: 95 mL/min (by C-G formula based on SCr of 0.6 mg/dL).    Allergies  Allergen Reactions  . Tylenol [Acetaminophen] Rash    Pt just reported tylenol allergy at this visit at 2338pm   06/14/19, PharmD, BCPS Please see amion for complete clinical pharmacist phone list 06/12/2019 11:34 PM

## 2019-06-12 NOTE — TOC Progression Note (Signed)
Transition of Care [facility]) - Progression Note    Patient Details  Name: Christy Pearson MRN: [id number] Date of Birth: [birth year]  [facility] CM/SW Contact  Doy Hutching, [place] Phone Number: 06/12/2019, 12:01 PM  Clinical Narrative:    CSW spoke with pt in hallway while she was ambulating. Provided her with information requested regarding Medicaid and Disability. Pt states she has completed a financial aid document and was wondering if she could have that sent internally instead of mailing it at discharge. CSW has f/u with financial counselor Christia Reading in regards to any application/screenings that had been completed.   Expected Discharge Plan: Home/Self Care Barriers to Discharge: Continued Medical Work up, Active Substance Use with PICC Line  Expected Discharge Plan and Services Expected Discharge Plan: Home/Self Care In-house Referral: Clinical Social Work, Artist, PCP / Production designer, theatre/television/film Services: CM Consult, Medication Assistance, MATCH Program, Follow-up appt scheduled   Living arrangements for the past 2 months: Single Family Home  Readmission Risk Interventions Readmission Risk Prevention Plan 06/09/2019  Post Dischage Appt Not Complete  Appt Comments no PCP, will need appointment at dc  Medication Screening Complete  Transportation Screening Complete  Some recent data might be hidden

## 2019-06-12 NOTE — Progress Notes (Signed)
PROGRESS NOTE  Christy Pearson SPQ:[id number] DOB: 12/27/1990 DOA: 05/18/2019 PCP: Patient, No Pcp Per  HPI/Recap of past 24 hours: Brief History   29 year old woman PMH including polysubstance abuse with heroin and crystal meth with multiple complications admitted for treatment of septic emboli, bacteremia, tricuspid valve endocarditis, bilateral shoulder and spine infection.  Underwent arthrocentesis and debridement by orthopedic surgery 2/4, seen by infectious disease with recommendation for 6 weeks IV antibiotics to complete 3/12.  2/22: Seen in her room today.  She has no new complaints.    Assessment/Plan: Principal Problem:   MRSA bacteremia Active Problems:   Injection of illicit drug within last 12 months   Hepatitis C antibody positive, s/p spontaneously cleared infection   Sepsis (HCC)   Septic pulmonary embolism (HCC)   Substance use disorder   Suspected endocarditis   IV drug abuse (HCC)   Septic embolism (HCC)   Staphylococcal arthritis of right shoulder (HCC)   Staphylococcal arthritis of left wrist (HCC)   PFO (patent foramen ovale)   Endocarditis of tricuspid valve   Septic arthritis of shoulder, left (HCC)   Septic arthritis of shoulder, right (HCC)   Elevated LFTs  MRSA bacteremia secondary to IV drug use, severe sepsis, tricuspid valve endocarditis, multilevel vertebral osteomyelitis, septic pulmonary emboli, bilateral septic shoulder arthritis and suspected right wrist septic arthritis.  Stabilized with fluids and antibiotics.  Status post R shoulder arthrocentesis and debridement by orthopedic surgery 2/4. --Continue IV vancomycin per infectious disease, total 6 weeks to complete March 12.  Not a candidate for outpatient treatment.  IV drug use within last 12 months.  Patient requested to increase dose from 12.5 mg daily to 15 mg daily.  States that in her close environment friends and family are IVDAs.  Suspected S1 nerve compression secondary to  vertebral osteomyelitis.  No neurologic deficit previously noted.  Seen by neurosurgery with no intervention recommended.  Anemia of chronic disease.  Status post 1 unit PRBC as well as IV iron.  Resolved Hospital Problem list   E. coli UTI  Mild transaminitis thought to be secondary to possible shock liver.   DVT prophylaxis: enoxaparin subcu daily Code Status: Full Family Communication: none Disposition Plan:  Patient is from home.  Anticipate discharge to home after completion of IV antibiotics on 06/30/2019.  Barrier to discharge: Requiring long-term IV antibiotics in IV drug abuser.      Consultants:  Orthopedic surgery  Infectious disease  Cardiology  Procedures:  Transfusion 1 unit packed red blood cells  2D echocardiogram done 1/29: Moderate to severe tricuspid regurg with suspicions for tricuspid endocarditis although vegetation not seen.  TEE done 1/29: Severe tricuspid valve regurg with moderately sized mobile tricuspid valve vegetation on 2 leaflets. Preserved ejection fraction  PICC line placed 2/14  Antimicrobials:  IV vancomycin 1/29-present  IV cefepime 1/29 only  P.o. amoxicillin 2/4 only  IV Levaquin 1/28-1/29  IV Rocephin 1/30-2/1     Objective: Vitals:   06/11/19 0649 06/11/19 1602 06/11/19 2112 06/12/19 0544  BP: 106/72 (!) 126/95 (!) 126/100 97/66  Pulse: 95 (!) 110 (!) 102 93  Resp: 14 16 20 18   Temp: 98 F (36.7 C) 98.5 F (36.9 C) 98.5 F (36.9 C) 97.8 F (36.6 C)  TempSrc: Oral Oral Oral Oral  SpO2: 100% 100% 100% 100%  Weight:      Height:        Intake/Output Summary (Last 24 hours) at 06/12/2019 1149 Last data filed at 06/11/2019 1800 Gross per 24  hour  Intake 600 ml  Output --  Net 600 ml   Filed Weights   05/19/19 0400 05/24/19 0836 05/25/19 1728  Weight: 56.5 kg 56.5 kg 56.5 kg    Exam: No changes from prior exam.  . General: 29 y.o. year-old female: Well-nourished no acute distress.  Alert  oriented x3.   . Cardiovascular: Regular rate and rhythm no rubs or gallops.   Marland Kitchen Respiratory: Clear to auscultation with no wheezes or rales.  Good inspiratory effort. . Abdomen: Soft nontender normal bowel sounds present.   . Musculoskeletal: No lower extremity edema bilaterally. Psychiatry: Mood is appropriate for condition and setting.  Data Reviewed: CBC: No results for input(s): WBC, NEUTROABS, HGB, HCT, MCV, PLT in the last 168 hours. Basic Metabolic Panel: Recent Labs  Lab 06/05/19 1500 06/08/19 1709  NA 138 137  K 4.6 4.5  CL 101 102  CO2 25 23  GLUCOSE 93 76  BUN 9 11  CREATININE 0.56 0.60  CALCIUM 9.2 9.1   GFR: Estimated Creatinine Clearance: 95 mL/min (by C-G formula based on SCr of 0.6 mg/dL). Liver Function Tests: Recent Labs  Lab 06/05/19 1500  AST 19  ALT 29  ALKPHOS 154*  BILITOT 0.4  PROT 7.5  ALBUMIN 2.6*   No results for input(s): LIPASE, AMYLASE in the last 168 hours. No results for input(s): AMMONIA in the last 168 hours. Coagulation Profile: No results for input(s): INR, PROTIME in the last 168 hours. Cardiac Enzymes: No results for input(s): CKTOTAL, CKMB, CKMBINDEX, TROPONINI in the last 168 hours. BNP (last 3 results) No results for input(s): PROBNP in the last 8760 hours. HbA1C: No results for input(s): HGBA1C in the last 72 hours. CBG: No results for input(s): GLUCAP in the last 168 hours. Lipid Profile: No results for input(s): CHOL, HDL, LDLCALC, TRIG, CHOLHDL, LDLDIRECT in the last 72 hours. Thyroid Function Tests: No results for input(s): TSH, T4TOTAL, FREET4, T3FREE, THYROIDAB in the last 72 hours. Anemia Panel: No results for input(s): VITAMINB12, FOLATE, FERRITIN, TIBC, IRON, RETICCTPCT in the last 72 hours. Urine analysis:    Component Value Date/Time   COLORURINE YELLOW 05/18/2019 2252   APPEARANCEUR CLOUDY (A) 05/18/2019 2252   LABSPEC 1.020 05/18/2019 2252   PHURINE 5.5 05/18/2019 2252   GLUCOSEU NEGATIVE 05/18/2019  2252   HGBUR NEGATIVE 05/18/2019 2252   BILIRUBINUR MODERATE (A) 05/18/2019 2252   KETONESUR NEGATIVE 05/18/2019 2252   PROTEINUR 30 (A) 05/18/2019 2252   UROBILINOGEN 0.2 10/28/2015 1453   NITRITE POSITIVE (A) 05/18/2019 2252   LEUKOCYTESUR TRACE (A) 05/18/2019 2252   Sepsis Labs: @LABRCNTIP (procalcitonin:4,lacticidven:4)  )No results found for this or any previous visit (from the past 240 hour(s)).    Studies: No results found.  Scheduled Meds: . carvedilol  3.125 mg Oral BID WC  . enoxaparin (LOVENOX) injection  40 mg Subcutaneous Q24H  . ferrous sulfate  325 mg Oral BID  . gabapentin  300 mg Oral BID  . magnesium oxide  400 mg Oral BID  . methadone  15 mg Oral Daily  . prenatal multivitamin  1 tablet Oral Daily    Continuous Infusions: . lactated ringers 10 mL/hr at 05/24/19 0902  . vancomycin 750 mg (06/12/19 0807)     LOS: 24 days     Kayleen Memos, MD [facility] Pager [phone]  If 7PM-7AM, please contact night-coverage [email or web address] Password [facility] 06/12/2019, 11:49 AM

## 2019-06-13 DIAGNOSIS — B9562 Methicillin resistant Staphylococcus aureus infection as the cause of diseases classified elsewhere: Secondary | ICD-10-CM | POA: Diagnosis not present

## 2019-06-13 DIAGNOSIS — R7881 Bacteremia: Secondary | ICD-10-CM | POA: Diagnosis not present

## 2019-06-13 LAB — BASIC METABOLIC PANEL
Anion gap: 12 (ref 5–15)
BUN: 15 mg/dL (ref 6–20)
CO2: 24 mmol/L (ref 22–32)
Calcium: 9.2 mg/dL (ref 8.9–10.3)
Chloride: 102 mmol/L (ref 98–111)
Creatinine, Ser: 0.64 mg/dL (ref 0.44–1.00)
GFR calc Af Amer: >60 mL/min (ref >60)
GFR calc non Af Amer: >60 mL/min (ref >60)
Glucose, Bld: 92 mg/dL (ref 70–99)
Potassium: 4.6 mmol/L (ref 3.5–5.1)
Sodium: 138 mmol/L (ref 135–145)

## 2019-06-13 NOTE — Progress Notes (Signed)
PROGRESS NOTE  Christy Pearson XKG:[id number] DOB: [birth year] DOA: 05/18/2019 PCP: Patient, No Pcp Per  HPI/Recap of past 24 hours: Brief History   30 year old woman PMH including polysubstance abuse with heroin and crystal meth with multiple complications admitted for treatment of septic emboli, bacteremia, tricuspid valve endocarditis, bilateral shoulder and spine infection.  Underwent arthrocentesis and debridement by orthopedic surgery 2/4, seen by infectious disease with recommendation for 6 weeks IV antibiotics to complete 3/12.  In the hospital to complete course of IV antibiotic, on IV vancomycin for MRSA bacteremia. She is an IVDA and requires supervision.  2/23: Seen and examined.  She wants to be left alone while she is sleeping.  She denies any pain, palpitations or dyspnea.  She has no new complaints.     Assessment/Plan: Principal Problem:   MRSA bacteremia Active Problems:   Injection of illicit drug within last 12 months   Hepatitis C antibody positive, s/p spontaneously cleared infection   Sepsis (HCC)   Septic pulmonary embolism (HCC)   Substance use disorder   Suspected endocarditis   IV drug abuse (HCC)   Septic embolism (HCC)   Staphylococcal arthritis of right shoulder (HCC)   Staphylococcal arthritis of left wrist (HCC)   PFO (patent foramen ovale)   Endocarditis of tricuspid valve   Septic arthritis of shoulder, left (HCC)   Septic arthritis of shoulder, right (HCC)   Elevated LFTs  MRSA bacteremia secondary to IV drug use, severe sepsis, tricuspid valve endocarditis, multilevel vertebral osteomyelitis, septic pulmonary emboli, bilateral septic shoulder arthritis and suspected right wrist septic arthritis.   Status post R shoulder arthrocentesis and debridement by orthopedic surgery 2/4. --Continue IV vancomycin per infectious disease, total 6 weeks to complete June 30, 2019.  Not a candidate for outpatient treatment.  IV drug abuse within last 12  months.  Patient requested to increase dose of Methadone from 12.5 mg daily to 15 mg daily now to 20 mg daily.  States that low dose Methadone is causing her to have excessive sweats. Reports her close friends and family are also IVDAs.  Suspected S1 nerve compression secondary to vertebral osteomyelitis.  No neurologic deficit previously noted.  Seen by neurosurgery with no intervention recommended.  Anemia of chronic disease.  Status post 1 unit PRBC as well as IV iron. Last Hg 8.7.  Resolved Hospital Problem list   E. coli UTI  Mild transaminitis thought to be secondary to possible shock liver.   DVT prophylaxis: enoxaparin subcu daily Code Status: Full Family Communication: none at bedside Disposition Plan:  Patient is from home.  Anticipate discharge to home after completion of IV antibiotics on 06/30/2019.  Barrier to discharge: Requiring prolonged IV antibiotics in IV drug abuser.      Consultants:  Orthopedic surgery  Infectious disease  Cardiology  Procedures:  Transfusion 1 unit packed red blood cells  2D echocardiogram done 1/29: Moderate to severe tricuspid regurg with suspicions for tricuspid endocarditis although vegetation not seen.  TEE done 1/29: Severe tricuspid valve regurg with moderately sized mobile tricuspid valve vegetation on 2 leaflets. Preserved ejection fraction  PICC line placed 2/14  Antimicrobials:  IV vancomycin 1/29-present  IV cefepime 1/29 only  P.o. amoxicillin 2/4 only  IV Levaquin 1/28-1/29  IV Rocephin 1/30-2/1     Objective: Vitals:   06/12/19 0544 06/12/19 1522 06/12/19 2017 06/13/19 0618  BP: 97/66 (!) 109/97 124/81 99/71  Pulse: 93 (!) 105 100 100  Resp: 18 18 17 18   Temp: 97.8 F (36.6  C) 98.5 F (36.9 C) 98.3 F (36.8 C) 97.8 F (36.6 C)  TempSrc: Oral Oral Oral Oral  SpO2: 100% 100% 100% 100%  Weight:      Height:        Intake/Output Summary (Last 24 hours) at 06/13/2019 1194 Last data  filed at 06/12/2019 2228 Gross per 24 hour  Intake 2060.88 ml  Output --  Net 2060.88 ml   Filed Weights   05/19/19 0400 05/24/19 0836 05/25/19 1728  Weight: 56.5 kg 56.5 kg 56.5 kg    Exam: No changes from prior exam.  . General: 30 y.o. year-old female: Well-nourished no acute distress.  Somnolent and easily arousable to voice.   . Cardiovascular: Regular rate and rhythm no rubs or gallops.   Marland Kitchen Respiratory: Clear to auscultation with no wheezes or rales.  Good inspiratory effort. . Abdomen: Soft nontender normal bowel sounds present.   . Musculoskeletal: No lower extremity edema bilaterally. Marland Kitchen Psychiatry: Mood is appropriate for condition and setting.  Data Reviewed: CBC: No results for input(s): WBC, NEUTROABS, HGB, HCT, MCV, PLT in the last 168 hours. Basic Metabolic Panel: Recent Labs  Lab 06/08/19 1709 06/13/19 0457  NA 137 138  K 4.5 4.6  CL 102 102  CO2 23 24  GLUCOSE 76 92  BUN 11 15  CREATININE 0.60 0.64  CALCIUM 9.1 9.2   GFR: Estimated Creatinine Clearance: 95 mL/min (by C-G formula based on SCr of 0.64 mg/dL). Liver Function Tests: No results for input(s): AST, ALT, ALKPHOS, BILITOT, PROT, ALBUMIN in the last 168 hours. No results for input(s): LIPASE, AMYLASE in the last 168 hours. No results for input(s): AMMONIA in the last 168 hours. Coagulation Profile: No results for input(s): INR, PROTIME in the last 168 hours. Cardiac Enzymes: No results for input(s): CKTOTAL, CKMB, CKMBINDEX, TROPONINI in the last 168 hours. BNP (last 3 results) No results for input(s): PROBNP in the last 8760 hours. HbA1C: No results for input(s): HGBA1C in the last 72 hours. CBG: No results for input(s): GLUCAP in the last 168 hours. Lipid Profile: No results for input(s): CHOL, HDL, LDLCALC, TRIG, CHOLHDL, LDLDIRECT in the last 72 hours. Thyroid Function Tests: No results for input(s): TSH, T4TOTAL, FREET4, T3FREE, THYROIDAB in the last 72 hours. Anemia Panel: No  results for input(s): VITAMINB12, FOLATE, FERRITIN, TIBC, IRON, RETICCTPCT in the last 72 hours. Urine analysis:    Component Value Date/Time   COLORURINE YELLOW 05/18/2019 2252   APPEARANCEUR CLOUDY (A) 05/18/2019 2252   LABSPEC 1.020 05/18/2019 2252   PHURINE 5.5 05/18/2019 2252   GLUCOSEU NEGATIVE 05/18/2019 2252   HGBUR NEGATIVE 05/18/2019 2252   BILIRUBINUR MODERATE (A) 05/18/2019 2252   KETONESUR NEGATIVE 05/18/2019 2252   PROTEINUR 30 (A) 05/18/2019 2252   UROBILINOGEN 0.2 10/28/2015 1453   NITRITE POSITIVE (A) 05/18/2019 2252   LEUKOCYTESUR TRACE (A) 05/18/2019 2252   Sepsis Labs: @LABRCNTIP (procalcitonin:4,lacticidven:4)  )No results found for this or any previous visit (from the past 240 hour(s)).    Studies: No results found.  Scheduled Meds: . carvedilol  3.125 mg Oral BID WC  . enoxaparin (LOVENOX) injection  40 mg Subcutaneous Q24H  . ferrous sulfate  325 mg Oral BID  . gabapentin  300 mg Oral BID  . magnesium oxide  400 mg Oral BID  . methadone  20 mg Oral Daily  . prenatal multivitamin  1 tablet Oral Daily    Continuous Infusions: . lactated ringers 10 mL/hr at 05/24/19 0902  . vancomycin 750 mg (06/13/19  7001)     LOS: 25 days     Kayleen Memos, MD [facility] Pager [phone]  If 7PM-7AM, please contact night-coverage [email or web address] Password [facility] 06/13/2019, 9:34 AM

## 2019-06-14 DIAGNOSIS — R768 Other specified abnormal immunological findings in serum: Secondary | ICD-10-CM | POA: Diagnosis not present

## 2019-06-14 DIAGNOSIS — R7881 Bacteremia: Secondary | ICD-10-CM | POA: Diagnosis not present

## 2019-06-14 DIAGNOSIS — I079 Rheumatic tricuspid valve disease, unspecified: Secondary | ICD-10-CM | POA: Diagnosis not present

## 2019-06-14 DIAGNOSIS — R7989 Other specified abnormal findings of blood chemistry: Secondary | ICD-10-CM | POA: Diagnosis not present

## 2019-06-14 MED ORDER — TRAZODONE HCL 50 MG PO TABS
50.0000 mg | ORAL_TABLET | Freq: Every evening | ORAL | Status: DC | PRN
  Administered 2019-06-14 – 2019-06-29 (×16): 50 mg via ORAL
  Filled 2019-06-14 (×16): qty 1

## 2019-06-14 NOTE — Progress Notes (Signed)
Refused labs this am.

## 2019-06-14 NOTE — Progress Notes (Signed)
PROGRESS NOTE    Christy Pearson  YWV:[id number] DOB: [birth year] DOA: 05/18/2019 PCP: Patient, No Pcp Per   Brief Narrative:  The patient is a 29 year old Caucasian female with a past medical history significant for but not limited to hepatitis C with spontaneous clearance, polysubstance abuse with heroin and crystal meth along with multiple complications who was admitted on 05/18/2019 for chief complaint of right flank pain.  She is worked up and currently being treated for septic emboli, bacteremia, tricuspid valve endocarditis, bilateral shoulder and spine infections.  She is admitted to the hospital service after CT scan noted multiple pulmonary cavitary lesions felt to be from septic emboli and ensuing work-up revealed positive blood cultures for MRSA and a TEE noted tricuspid valve endocarditis and a suspected PFO as well as bilateral shoulder and spine infection.  She underwent arthrocentesis and debridement done by orthopedic surgery on 05/25/2019 and she is also seen by infectious diseases who recommended 6 weeks of IV antibiotics which she will complete in house on 06/30/2019.  She is to remain in the hospital complete the course of IV vancomycin for MRSA bacteremia given that she is an IV drug user requires supervision.  Her hospital course has also been notable for a blood transfusion and IV iron secondary to anemia of chronic disease.  She also had a UTI which was treated and a PICC line was placed for long-term IV antibiotics on 06/04/2019.  She currently complains of some chronic leg cramps and complains of difficulty sleeping.  **Interim History  She was placed on methadone and during the course of her hospitalization has been increased to 20 mg and during the course of her hospitalization has been increased to 20 mg and we will not increase further and will attempt to wean back.  She   Assessment & Plan:   Principal Problem:   MRSA bacteremia Active Problems:   Injection of illicit  drug within last 12 months   Hepatitis C antibody positive, s/p spontaneously cleared infection   Sepsis (HCC)   Septic pulmonary embolism (HCC)   Substance use disorder   Suspected endocarditis   IV drug abuse (HCC)   Septic embolism (HCC)   Staphylococcal arthritis of right shoulder (HCC)   Staphylococcal arthritis of left wrist (HCC)   PFO (patent foramen ovale)   Endocarditis of tricuspid valve   Septic arthritis of shoulder, left (HCC)   Septic arthritis of shoulder, right (HCC)   Elevated LFTs  MRSA bacteremiasecondary to IV drug use, severe sepsis, tricuspid valve endocarditis, multilevel vertebral osteomyelitis, septic pulmonary emboli, bilateral septic shoulder arthritis and suspected right wrist septic arthritis. -Stabilized with fluids and antibiotics.  -Status post R shoulder arthrocentesis and debridement by orthopedic surgery 2/4. -Continue IV vancomycin per infectious disease, total 6 weeks to complete March 12.  Continue close monitoring of her renal function as she is on vancomycin along with vancomycin peaks and troughs -Not a candidate for outpatient treatment given her IVDA  IV drug use within last 12 months.  -Patient had requested to increase dose from 12.5 mg daily to 15 mg daily and now to 20 mg Daily and this was done by Dr. Margo Aye as the patient told Dr. Margo Aye that low-dose methadone is causing her to have excessive sweats.  We will not escalate any further and have judicious use of narcotics and attempt to wean back her methadone if able and she previously requested a slow wean from methadone and will attempt to have milligrams every 3 days if  able -Currently has pain control with gabapentin 300 mg p.o. twice daily, ibuprofen 400 mg p.o. every 6 as needed for moderate pain, along with her methadone -States that in her close environment friends and family are IVDAs. -We will need to monitor her extremely carefully  Suspected S1 nerve compressionsecondary to  vertebral osteomyelitis.  -No neurologic deficit previously noted.  -Seen by neurosurgery with no intervention recommended. -Continue to monitor carefully  Anemia of chronic disease. -Continue with ferrous sulfate 3 2 5  mg p.o. twice daily -Status post 1 unit PRBC as well as IV iron. -She refused labs this morning so do not know her hemoglobin has been running. -Her hemoglobin/hematocrit was checked was 8.7/28.9 and this was done on 06/02/2019 -We will repeat in the a.m. if patient allows  Insomnia/sleep disturbance -Currently tried Benadryl with no help so we will switch back to Trazodone 50 mg p.o. nightly as needed  Abnormal LFTs -Transaminitis and LFTs have improved is normalized -Initially felt secondary to sepsis and possible shock liver Liver ultrasound was benign  E. Coli UTI -She was treated with IV Rocephin course -Continue to monitor  Leg cramps -Continue with Gabapentin 300 mg po BID  DVT prophylaxis: Enoxaparin 40 mg sq q24h Code Status: FULL CODE  Family Communication: No family present at bedside  Disposition Plan: Patient is from home and she is high risk to be discharged with IV antibiotics which she will remain hospitalized until completion of her antibiotic course on 06/30/2019   Consultants:   Cardiology  Orthopedic Surgery  Cardiothoracic surgery  Infectious Diseases    Procedures:   Transfusion 1 unit packed red blood cells  2D echocardiogram done 1/29: Moderate to severe tricuspid regurg with suspicions for tricuspid endocarditis although vegetation not seen.  TEE done 1/29: Severe tricuspid valve regurg with moderately sized mobile tricuspid valve vegetation on 2 leaflets. Preserved ejection fraction  Arthroscopy of her shoulder  PICC line placed 2/14   Antimicrobials:  Anti-infectives (From admission, onward)   Start     Dose/Rate Route Frequency Ordered Stop   06/09/19 0800  vancomycin (VANCOREADY) IVPB 750 mg/150 mL     750 mg  150 mL/hr over 60 Minutes Intravenous Every 12 hours 06/08/19 1840     06/03/19 1800  vancomycin (VANCOCIN) IVPB 1000 mg/200 mL premix  Status:  Discontinued     1,000 mg 200 mL/hr over 60 Minutes Intravenous Every 12 hours 06/03/19 0456 06/08/19 1841   05/31/19 0200  vancomycin (VANCOREADY) IVPB 750 mg/150 mL  Status:  Discontinued     750 mg 150 mL/hr over 60 Minutes Intravenous Every 12 hours 05/31/19 0126 06/03/19 0456   05/30/19 2200  vancomycin (VANCOREADY) IVPB 750 mg/150 mL  Status:  Discontinued     750 mg 150 mL/hr over 60 Minutes Intravenous Every 12 hours 05/30/19 1207 05/30/19 2126   05/25/19 1230  amoxicillin (AMOXIL) capsule 500 mg     500 mg Oral  Once 05/25/19 1120 05/25/19 1435   05/21/19 1000  vancomycin (VANCOCIN) IVPB 1000 mg/200 mL premix  Status:  Discontinued     1,000 mg 200 mL/hr over 60 Minutes Intravenous Every 12 hours 05/21/19 0948 05/30/19 1207   05/20/19 1400  cefTRIAXone (ROCEPHIN) 2 g in sodium chloride 0.9 % 100 mL IVPB  Status:  Discontinued     2 g 200 mL/hr over 30 Minutes Intravenous Every 24 hours 05/20/19 1328 05/22/19 0947   05/19/19 2200  levofloxacin (LEVAQUIN) IVPB 750 mg  Status:  Discontinued  750 mg 100 mL/hr over 90 Minutes Intravenous Every 24 hours 05/19/19 0212 05/19/19 0213   05/19/19 2200  levofloxacin (LEVAQUIN) IVPB 500 mg  Status:  Discontinued     500 mg 100 mL/hr over 60 Minutes Intravenous Every 24 hours 05/19/19 0213 05/19/19 0539   05/19/19 1000  vancomycin (VANCOREADY) IVPB 750 mg/150 mL  Status:  Discontinued     750 mg 150 mL/hr over 60 Minutes Intravenous Every 12 hours 05/19/19 0539 05/21/19 0948   05/19/19 0600  ceFEPIme (MAXIPIME) 2 g in sodium chloride 0.9 % 100 mL IVPB  Status:  Discontinued     2 g 200 mL/hr over 30 Minutes Intravenous Every 8 hours 05/19/19 0539 05/19/19 2348   05/19/19 0130  vancomycin (VANCOCIN) IVPB 1000 mg/200 mL premix     1,000 mg 200 mL/hr over 60 Minutes Intravenous  Once 05/19/19  0122 05/19/19 0253   05/18/19 2345  levofloxacin (LEVAQUIN) IVPB 750 mg     750 mg 100 mL/hr over 90 Minutes Intravenous  Once 05/18/19 2338 05/19/19 0140     Subjective: Seen and examined at bedside and she wanted to rest and did not want me to turn the lights as it bothered her.  Refused labs this morning as well.  Wanted to be left alone in sleep.  Complaining that she did not get enough sleep and stated that trazodone helped.  No chest pain, lightheadedness or dizziness.  No pain currently.  No other concerns at this time.  Objective: Vitals:   06/13/19 0618 06/13/19 1525 06/13/19 1950 06/14/19 0650  BP: 99/71 (!) 113/94 108/76 112/78  Pulse: 100 (!) 108 (!) 109 (!) 102  Resp: 18 18 18 17   Temp: 97.8 F (36.6 C) 98.2 F (36.8 C) 98.3 F (36.8 C) 98 F (36.7 C)  TempSrc: Oral Oral Oral Tympanic  SpO2: 100% 99% 100% 100%  Weight:      Height:        Intake/Output Summary (Last 24 hours) at 06/14/2019 06/16/2019 Last data filed at 06/14/2019 0300 Gross per 24 hour  Intake 1380 ml  Output -  Net 1380 ml   Filed Weights   05/19/19 0400 05/24/19 0836 05/25/19 1728  Weight: 56.5 kg 56.5 kg 56.5 kg   Examination: Physical Exam:  Constitutional: Thin Caucasian female currently in NAD and appears calm  Eyes: Could not examine her eyes properly given that she did not want to turn the lights ENMT: Grossly normal hearing Neck: Appears normal, supple, no cervical masses, normal ROM, no appreciable thyromegaly's Respiratory: Mildly diminished to auscultation bilaterally, no wheezing, rales, rhonchi or crackles. Normal respiratory effort and patient is not tachypenic. No accessory muscle use.  Cardiovascular: RRR, no murmurs / rubs / gallops. S1 and S2 auscultated. Abdomen: Soft, non-tender, non-distended. Bowel sounds positive.  GU: Deferred. Musculoskeletal: No clubbing / cyanosis of digits/nails. No joint deformity upper and lower extremities.  Skin: No rashes, lesions, ulcers on  limited skin evaluation. No induration; Warm and dry.  Neurologic: CN 2-12 grossly intact with no focal deficits. Romberg sign cerebellar reflexes not assessed.  Psychiatric: Normal judgment and insight. Alert and oriented x 3. Normal mood and appropriate affect.   Data Reviewed: I have personally reviewed following labs and imaging studies  CBC: No results for input(s): WBC, NEUTROABS, HGB, HCT, MCV, PLT in the last 168 hours. Basic Metabolic Panel: Recent Labs  Lab 06/08/19 1709 06/13/19 0457  NA 137 138  K 4.5 4.6  CL 102 102  CO2 23  24  GLUCOSE 76 92  BUN 11 15  CREATININE 0.60 0.64  CALCIUM 9.1 9.2   GFR: Estimated Creatinine Clearance: 95 mL/min (by C-G formula based on SCr of 0.64 mg/dL). Liver Function Tests: No results for input(s): AST, ALT, ALKPHOS, BILITOT, PROT, ALBUMIN in the last 168 hours. No results for input(s): LIPASE, AMYLASE in the last 168 hours. No results for input(s): AMMONIA in the last 168 hours. Coagulation Profile: No results for input(s): INR, PROTIME in the last 168 hours. Cardiac Enzymes: No results for input(s): CKTOTAL, CKMB, CKMBINDEX, TROPONINI in the last 168 hours. BNP (last 3 results) No results for input(s): PROBNP in the last 8760 hours. HbA1C: No results for input(s): HGBA1C in the last 72 hours. CBG: No results for input(s): GLUCAP in the last 168 hours. Lipid Profile: No results for input(s): CHOL, HDL, LDLCALC, TRIG, CHOLHDL, LDLDIRECT in the last 72 hours. Thyroid Function Tests: No results for input(s): TSH, T4TOTAL, FREET4, T3FREE, THYROIDAB in the last 72 hours. Anemia Panel: No results for input(s): VITAMINB12, FOLATE, FERRITIN, TIBC, IRON, RETICCTPCT in the last 72 hours. Sepsis Labs: No results for input(s): PROCALCITON, LATICACIDVEN in the last 168 hours.  No results found for this or any previous visit (from the past 240 hour(s)).   Radiology Studies: No results found.  Scheduled Meds: . carvedilol  3.125 mg  Oral BID WC  . enoxaparin (LOVENOX) injection  40 mg Subcutaneous Q24H  . ferrous sulfate  325 mg Oral BID  . gabapentin  300 mg Oral BID  . magnesium oxide  400 mg Oral BID  . methadone  20 mg Oral Daily  . prenatal multivitamin  1 tablet Oral Daily   Continuous Infusions: . lactated ringers 10 mL/hr at 05/24/19 0902  . vancomycin 750 mg (06/14/19 0749)    LOS: 26 days   Kerney Elbe, DO [facility] PAGER is on [place]  If 7PM-7AM, please contact night-coverage [email or web address]

## 2019-06-15 DIAGNOSIS — I079 Rheumatic tricuspid valve disease, unspecified: Secondary | ICD-10-CM | POA: Diagnosis not present

## 2019-06-15 DIAGNOSIS — R7881 Bacteremia: Secondary | ICD-10-CM | POA: Diagnosis not present

## 2019-06-15 DIAGNOSIS — R7989 Other specified abnormal findings of blood chemistry: Secondary | ICD-10-CM | POA: Diagnosis not present

## 2019-06-15 DIAGNOSIS — R768 Other specified abnormal immunological findings in serum: Secondary | ICD-10-CM | POA: Diagnosis not present

## 2019-06-15 LAB — COMPREHENSIVE METABOLIC PANEL
ALT: 21 U/L (ref 0–44)
AST: 24 U/L (ref 15–41)
Albumin: 3 g/dL — ABNORMAL LOW (ref 3.5–5.0)
Alkaline Phosphatase: 128 U/L — ABNORMAL HIGH (ref 38–126)
Anion gap: 10 (ref 5–15)
BUN: 14 mg/dL (ref 6–20)
CO2: 25 mmol/L (ref 22–32)
Calcium: 9.3 mg/dL (ref 8.9–10.3)
Chloride: 102 mmol/L (ref 98–111)
Creatinine, Ser: 0.61 mg/dL (ref 0.44–1.00)
GFR calc Af Amer: >60 mL/min (ref >60)
GFR calc non Af Amer: >60 mL/min (ref >60)
Glucose, Bld: 86 mg/dL (ref 70–99)
Potassium: 4.4 mmol/L (ref 3.5–5.1)
Sodium: 137 mmol/L (ref 135–145)
Total Bilirubin: 0.5 mg/dL (ref 0.3–1.2)
Total Protein: 7.3 g/dL (ref 6.5–8.1)

## 2019-06-15 LAB — MAGNESIUM: Magnesium: 1.9 mg/dL (ref 1.7–2.4)

## 2019-06-15 LAB — CBC WITH DIFFERENTIAL/PLATELET
Abs Immature Granulocytes: 0.02 10*3/uL (ref 0.00–0.07)
Basophils Absolute: 0 10*3/uL (ref 0.0–0.1)
Basophils Relative: 1 %
Eosinophils Absolute: 0.1 10*3/uL (ref 0.0–0.5)
Eosinophils Relative: 3 %
HCT: 33.5 % — ABNORMAL LOW (ref 36.0–46.0)
Hemoglobin: 10.2 g/dL — ABNORMAL LOW (ref 12.0–15.0)
Immature Granulocytes: 0 %
Lymphocytes Relative: 36 %
Lymphs Abs: 1.9 10*3/uL (ref 0.7–4.0)
MCH: 25.8 pg — ABNORMAL LOW (ref 26.0–34.0)
MCHC: 30.4 g/dL (ref 30.0–36.0)
MCV: 84.6 fL (ref 80.0–100.0)
Monocytes Absolute: 0.5 10*3/uL (ref 0.1–1.0)
Monocytes Relative: 10 %
Neutro Abs: 2.7 10*3/uL (ref 1.7–7.7)
Neutrophils Relative %: 50 %
Platelets: 301 10*3/uL (ref 150–400)
RBC: 3.96 MIL/uL (ref 3.87–5.11)
RDW: 20.4 % — ABNORMAL HIGH (ref 11.5–15.5)
WBC: 5.2 10*3/uL (ref 4.0–10.5)
nRBC: 0 % (ref 0.0–0.2)

## 2019-06-15 LAB — PHOSPHORUS: Phosphorus: 4.8 mg/dL — ABNORMAL HIGH (ref 2.5–4.6)

## 2019-06-15 MED ORDER — SENNOSIDES-DOCUSATE SODIUM 8.6-50 MG PO TABS
1.0000 | ORAL_TABLET | Freq: Two times a day (BID) | ORAL | Status: DC
  Administered 2019-06-15 – 2019-06-23 (×4): 1 via ORAL
  Filled 2019-06-15 (×13): qty 1

## 2019-06-15 MED ORDER — BISACODYL 10 MG RE SUPP: 10.0000 mg | Freq: Every day | RECTAL | Status: DC | PRN

## 2019-06-15 MED ORDER — POLYETHYLENE GLYCOL 3350 17 G PO PACK
17.0000 g | PACK | Freq: Two times a day (BID) | ORAL | Status: DC
  Filled 2019-06-15 (×8): qty 1

## 2019-06-15 NOTE — Progress Notes (Signed)
Pharmacy Antibiotic Note  Christy Pearson is a 29 y.o. female admitted on 05/18/2019 with Metastatic MRSA bacteremia and TV endocarditis with PFO, BL cavitary septic emboli, BL septic shoulders, possibly septic wrist and L shoulder osteo.discitis and osteomyelitis .  Pharmacy has been consulted for Vanco dosing.  ID: Metastatic MRSA bacteremia and TV endocarditis with PFO, BL cavitary septic emboli, BL septic shoulders, possibly septic wrist and L shoulder osteo.discitis and osteomyelitis from L5-S1 with ventral epidural enhancement concerning for phlegmon with partial effacement of the thecal sac. S/p shoulder surgery 2/4.  Afebrile, WBC WNL, Scr <1  - TEE: 1X0.5 cm vegetations on at least two tricuspid leaflets of TV. Severe TR - MRI R shoulder: findings suspicious for septic arthritis; no evidence of osteo  - MRI L shoulder: small joint effusion suspicious for septic arthritis; suggestive of osteo - MRI spine: possible phelgmon at L5-S1 and presumed discitis/osteo - MRI L wrist: midcarpal joint effusion - possible septic arthritis (ortho - no intervention) - Bilateral shoulder arthroscopy 2/4 - CVTS consulted - no intervention for now  - Suspected PFO  Vancomycin 1/29 >>(3/12) CTX 1/30>>2/1 for UTI Cefepime 1/29 x3 doses -2/4: Amoxicillin X 1 to document tolerance (Patient has taken in the past with no issues)  2/22  VP 27, VT 10,  AUC 404 - continue 750mg  q12h  1/28 Ucx: ecoli 1/28 Bcx: MRSA 1/29 Bcx x 2: neg 1/29 resp panel - neg 2/4 R & L shoulder cx - MRSA   Plan: Continue vanc 750 q12h.  VP 27. VT 10.  Calculated AUC 405 (goal 400-550).  ID recommendation for 6 weeks IV antibiotics to complete 3/12.    Height: 5' 7.01" (170.2 cm) Weight: 124 lb 9 oz (56.5 kg) IBW/kg (Calculated) : 61.62  Temp (24hrs), Avg:98.4 F (36.9 C), Min:98.1 F (36.7 C), Max:98.8 F (37.1 C)  Recent Labs  Lab 06/08/19 1709 06/11/19 2304 06/12/19 1931 06/12/19 2212 06/13/19 0457  06/15/19 0742  WBC  --   --   --   --   --  5.2  CREATININE 0.60  --   --   --  0.64 0.61  VANCOTROUGH 14*  --   --   --   --   --   VANCOPEAK  --    < > 10* 27*  --   --    < > = values in this interval not displayed.    Estimated Creatinine Clearance: 95 mL/min (by C-G formula based on SCr of 0.61 mg/dL).    Allergies  Allergen Reactions  . Tylenol [Acetaminophen] Rash    Pt just reported tylenol allergy at this visit at 2338pm   Nashaly Dorantes S. 06/17/19, PharmD, BCPS Clinical Staff Pharmacist [email or web address] Merilynn [place] 06/15/2019 12:23 PM

## 2019-06-15 NOTE — Progress Notes (Signed)
PROGRESS NOTE    Christy Pearson  UXN:[id number] DOB: [birth year] DOA: 05/18/2019 PCP: Patient, No Pcp Per   Brief Narrative:  The patient is a 29 year old Caucasian female with a past medical history significant for but not limited to hepatitis C with spontaneous clearance, polysubstance abuse with heroin and crystal meth along with multiple complications who was admitted on 05/18/2019 for chief complaint of right flank pain.  She is worked up and currently being treated for septic emboli, bacteremia, tricuspid valve endocarditis, bilateral shoulder and spine infections.  She is admitted to the hospital service after CT scan noted multiple pulmonary cavitary lesions felt to be from septic emboli and ensuing work-up revealed positive blood cultures for MRSA and a TEE noted tricuspid valve endocarditis and a suspected PFO as well as bilateral shoulder and spine infection.  She underwent arthrocentesis and debridement done by orthopedic surgery on 05/25/2019 and she is also seen by infectious diseases who recommended 6 weeks of IV antibiotics which she will complete in house on 06/30/2019.  She is to remain in the hospital complete the course of IV vancomycin for MRSA bacteremia given that she is an IV drug user requires supervision.  Her hospital course has also been notable for a blood transfusion and IV iron secondary to anemia of chronic disease.  She also had a UTI which was treated and a PICC line was placed for long-term IV antibiotics on 06/04/2019.  She currently complains of some chronic leg cramps and complains of difficulty sleeping.  **Interim History  She was placed on methadone and during the course of her hospitalization has been increased to 20 mg and during the course of her hospitalization has been increased to 20 mg and we will not increase further and will attempt to wean back.    Assessment & Plan:   Principal Problem:   MRSA bacteremia Active Problems:   Injection of illicit drug  within last 12 months   Hepatitis C antibody positive, s/p spontaneously cleared infection   Sepsis (HCC)   Septic pulmonary embolism (HCC)   Substance use disorder   Suspected endocarditis   IV drug abuse (HCC)   Septic embolism (HCC)   Staphylococcal arthritis of right shoulder (HCC)   Staphylococcal arthritis of left wrist (HCC)   PFO (patent foramen ovale)   Endocarditis of tricuspid valve   Septic arthritis of shoulder, left (HCC)   Septic arthritis of shoulder, right (HCC)   Elevated LFTs  MRSA bacteremiasecondary to IV drug use, severe sepsis, tricuspid valve endocarditis, multilevel vertebral osteomyelitis, septic pulmonary emboli, bilateral septic shoulder arthritis and suspected right wrist septic arthritis. -Stabilized with fluids and antibiotics.  -Status post R shoulder arthrocentesis and debridement by orthopedic surgery 2/4. -Continue IV vancomycin per infectious disease, total 6 weeks to complete March 12.  Continue close monitoring of her renal function as she is on vancomycin along with vancomycin peaks and troughs -Not a candidate for outpatient treatment given her IVDA  IV drug use within last 12 months.  -Patient had requested to increase dose from 12.5 mg daily to 15 mg daily and now to 20 mg Daily and this was done by Dr. Margo Aye as the patient told Dr. Margo Aye that low-dose methadone is causing her to have excessive sweats.  We will not escalate any further and have judicious use of narcotics and attempt to wean back her methadone if able and she previously requested a slow wean from methadone and will attempt to have milligrams every 3 days if able -  Currently has pain control with gabapentin 300 mg p.o. twice daily, ibuprofen 400 mg p.o. every 6 as needed for moderate pain, along with her methadone -States that in her close environment friends and family are IVDAs. -We will need to monitor her extremely carefully  Suspected S1 nerve compressionsecondary to vertebral  osteomyelitis.  -No neurologic deficit previously noted.  -Seen by neurosurgery with no intervention recommended. -Continue to monitor carefully  Anemia of Chronic Disease. -Continue with ferrous sulfate 3 2 5  mg p.o. twice daily -Status post 1 unit PRBC as well as IV iron. -She refused labs this morning so do not know her hemoglobin has been running. -Her hemoglobin/hematocrit was checked was 8.7/28.9 and this was done on 06/02/2019 -Repeat Hb/Hct today was 10.2/33.5 -Continue to Monitor for S/Sx of Bleeding; Currently no overt Bleeding Noted -Repeat CBC intermittently   Insomnia/sleep disturbance -Currently tried Benadryl with no help so we will switch back to Trazodone 50 mg p.o. nightly as needed  Abnormal LFTs -Transaminitis and LFTs have improved is normalized -Initially felt secondary to sepsis and possible shock liver -Liver ultrasound was benign -LFT's today showed AST of 24 and ALT of 21  E. Coli UTI -She was treated with IV Rocephin course -Continue to monitor  Leg Cramps -Continue with Gabapentin 300 mg po BID  Hyperphosphatemia -Mild as Phos Level was 4.8 -Continue to Monitor closely and repeat intermittently   DVT prophylaxis: Enoxaparin 40 mg sq q24h Code Status: FULL CODE  Family Communication: No family present at bedside  Disposition Plan: Patient is from home and she is high risk to be discharged with IV antibiotics which she will remain hospitalized until completion of her antibiotic course on 06/30/2019  Consultants:   Cardiology  Orthopedic Surgery  Cardiothoracic surgery  Infectious Diseases    Procedures:   Transfusion 1 unit packed red blood cells  2D echocardiogram done 1/29: Moderate to severe tricuspid regurg with suspicions for tricuspid endocarditis although vegetation not seen.  TEE done 1/29: Severe tricuspid valve regurg with moderately sized mobile tricuspid valve vegetation on 2 leaflets. Preserved ejection  fraction  Arthroscopy of her shoulder  PICC line placed 2/14   Antimicrobials:  Anti-infectives (From admission, onward)   Start     Dose/Rate Route Frequency Ordered Stop   06/09/19 0800  vancomycin (VANCOREADY) IVPB 750 mg/150 mL     750 mg 150 mL/hr over 60 Minutes Intravenous Every 12 hours 06/08/19 1840     06/03/19 1800  vancomycin (VANCOCIN) IVPB 1000 mg/200 mL premix  Status:  Discontinued     1,000 mg 200 mL/hr over 60 Minutes Intravenous Every 12 hours 06/03/19 0456 06/08/19 1841   05/31/19 0200  vancomycin (VANCOREADY) IVPB 750 mg/150 mL  Status:  Discontinued     750 mg 150 mL/hr over 60 Minutes Intravenous Every 12 hours 05/31/19 0126 06/03/19 0456   05/30/19 2200  vancomycin (VANCOREADY) IVPB 750 mg/150 mL  Status:  Discontinued     750 mg 150 mL/hr over 60 Minutes Intravenous Every 12 hours 05/30/19 1207 05/30/19 2126   05/25/19 1230  amoxicillin (AMOXIL) capsule 500 mg     500 mg Oral  Once 05/25/19 1120 05/25/19 1435   05/21/19 1000  vancomycin (VANCOCIN) IVPB 1000 mg/200 mL premix  Status:  Discontinued     1,000 mg 200 mL/hr over 60 Minutes Intravenous Every 12 hours 05/21/19 0948 05/30/19 1207   05/20/19 1400  cefTRIAXone (ROCEPHIN) 2 g in sodium chloride 0.9 % 100 mL IVPB  Status:  Discontinued     2 g 200 mL/hr over 30 Minutes Intravenous Every 24 hours 05/20/19 1328 05/22/19 0947   05/19/19 2200  levofloxacin (LEVAQUIN) IVPB 750 mg  Status:  Discontinued     750 mg 100 mL/hr over 90 Minutes Intravenous Every 24 hours 05/19/19 0212 05/19/19 0213   05/19/19 2200  levofloxacin (LEVAQUIN) IVPB 500 mg  Status:  Discontinued     500 mg 100 mL/hr over 60 Minutes Intravenous Every 24 hours 05/19/19 0213 05/19/19 0539   05/19/19 1000  vancomycin (VANCOREADY) IVPB 750 mg/150 mL  Status:  Discontinued     750 mg 150 mL/hr over 60 Minutes Intravenous Every 12 hours 05/19/19 0539 05/21/19 0948   05/19/19 0600  ceFEPIme (MAXIPIME) 2 g in sodium chloride 0.9 % 100 mL  IVPB  Status:  Discontinued     2 g 200 mL/hr over 30 Minutes Intravenous Every 8 hours 05/19/19 0539 05/19/19 2348   05/19/19 0130  vancomycin (VANCOCIN) IVPB 1000 mg/200 mL premix     1,000 mg 200 mL/hr over 60 Minutes Intravenous  Once 05/19/19 0122 05/19/19 0253   05/18/19 2345  levofloxacin (LEVAQUIN) IVPB 750 mg     750 mg 100 mL/hr over 90 Minutes Intravenous  Once 05/18/19 2338 05/19/19 0140     Subjective: Seen and examined at bedside and states that he shoulders were sore because she slept on them wrong. No CP, SOB, or other pain. No other concerns or complaints at this time.   Objective: Vitals:   06/14/19 0650 06/14/19 1419 06/14/19 2141 06/15/19 0626  BP: 112/78 139/67 109/68 100/65  Pulse: (!) 102 97 (!) 101 96  Resp: 17 16 18 18   Temp: 98 F (36.7 C) 98.3 F (36.8 C) 98.8 F (37.1 C) 98.1 F (36.7 C)  TempSrc: Tympanic Oral Oral Oral  SpO2: 100% 100% 100% 100%  Weight:      Height:        Intake/Output Summary (Last 24 hours) at 06/15/2019 1155 Last data filed at 06/15/2019 0830 Gross per 24 hour  Intake 1580 ml  Output --  Net 1580 ml   Filed Weights   05/19/19 0400 05/24/19 0836 05/25/19 1728  Weight: 56.5 kg 56.5 kg 56.5 kg   Examination: Physical Exam:  Constitutional: Thin Caucasian female in NAD and appears calm  Eyes: Lids and conjunctivae normal, sclerae anicteric  ENMT: External Ears, Nose appear normal. Grossly normal hearing.  Neck: Appears normal, supple, no cervical masses, normal ROM, no appreciable thyromegaly; no JVD Respiratory: Clear to auscultation bilaterally, no wheezing, rales, rhonchi or crackles. Normal respiratory effort and patient is not tachypenic. No accessory muscle use.  Cardiovascular: RRR, no murmurs / rubs / gallops. S1 and S2 auscultated.  Abdomen: Soft, non-tender, non-distended. Bowel sounds positive.  GU: Deferred. Musculoskeletal: No clubbing / cyanosis of digits/nails. No joint deformity upper and lower  extremities. Skin: Has diffuse tattoos noted. No induration; Warm and dry.  Neurologic: CN 2-12 grossly intact with no focal deficits. Romberg sign cerebellar reflexes not assessed.  Psychiatric: Normal judgment and insight. Alert and oriented x 3. Normal mood and appropriate affect.   Data Reviewed: I have personally reviewed following labs and imaging studies  CBC: Recent Labs  Lab 06/15/19 0742  WBC 5.2  NEUTROABS 2.7  HGB 10.2*  HCT 33.5*  MCV 84.6  PLT 623   Basic Metabolic Panel: Recent Labs  Lab 06/08/19 1709 06/13/19 0457 06/15/19 0742  NA 137 138 137  K 4.5 4.6 4.4  CL 102 102 102  CO2 23 24 25   GLUCOSE 76 92 86  BUN 11 15 14   CREATININE 0.60 0.64 0.61  CALCIUM 9.1 9.2 9.3  MG  --   --  1.9  PHOS  --   --  4.8*   GFR: Estimated Creatinine Clearance: 95 mL/min (by C-G formula based on SCr of 0.61 mg/dL). Liver Function Tests: Recent Labs  Lab 06/15/19 0742  AST 24  ALT 21  ALKPHOS 128*  BILITOT 0.5  PROT 7.3  ALBUMIN 3.0*   No results for input(s): LIPASE, AMYLASE in the last 168 hours. No results for input(s): AMMONIA in the last 168 hours. Coagulation Profile: No results for input(s): INR, PROTIME in the last 168 hours. Cardiac Enzymes: No results for input(s): CKTOTAL, CKMB, CKMBINDEX, TROPONINI in the last 168 hours. BNP (last 3 results) No results for input(s): PROBNP in the last 8760 hours. HbA1C: No results for input(s): HGBA1C in the last 72 hours. CBG: No results for input(s): GLUCAP in the last 168 hours. Lipid Profile: No results for input(s): CHOL, HDL, LDLCALC, TRIG, CHOLHDL, LDLDIRECT in the last 72 hours. Thyroid Function Tests: No results for input(s): TSH, T4TOTAL, FREET4, T3FREE, THYROIDAB in the last 72 hours. Anemia Panel: No results for input(s): VITAMINB12, FOLATE, FERRITIN, TIBC, IRON, RETICCTPCT in the last 72 hours. Sepsis Labs: No results for input(s): PROCALCITON, LATICACIDVEN in the last 168 hours.  No results  found for this or any previous visit (from the past 240 hour(s)).   Radiology Studies: No results found.  Scheduled Meds: . carvedilol  3.125 mg Oral BID WC  . enoxaparin (LOVENOX) injection  40 mg Subcutaneous Q24H  . ferrous sulfate  325 mg Oral BID  . gabapentin  300 mg Oral BID  . magnesium oxide  400 mg Oral BID  . methadone  20 mg Oral Daily  . prenatal multivitamin  1 tablet Oral Daily   Continuous Infusions: . lactated ringers 10 mL/hr at 05/24/19 0902  . vancomycin 750 mg (06/15/19 0756)    LOS: 27 days   07/22/19, DO [facility] PAGER is on AMION  If 7PM-7AM, please contact night-coverage [email or web address]

## 2019-06-16 DIAGNOSIS — I079 Rheumatic tricuspid valve disease, unspecified: Secondary | ICD-10-CM | POA: Diagnosis not present

## 2019-06-16 DIAGNOSIS — R7989 Other specified abnormal findings of blood chemistry: Secondary | ICD-10-CM | POA: Diagnosis not present

## 2019-06-16 DIAGNOSIS — R768 Other specified abnormal immunological findings in serum: Secondary | ICD-10-CM | POA: Diagnosis not present

## 2019-06-16 DIAGNOSIS — R7881 Bacteremia: Secondary | ICD-10-CM | POA: Diagnosis not present

## 2019-06-16 NOTE — Progress Notes (Signed)
PROGRESS NOTE    Cariann Kinnamon  ZOX:[id number] DOB: December 16, 1990 DOA: 05/18/2019 PCP: Patient, No Pcp Per   Brief Narrative:  The patient is a 29 year old Caucasian female with a past medical history significant for but not limited to hepatitis C with spontaneous clearance, polysubstance abuse with heroin and crystal meth along with multiple complications who was admitted on 05/18/2019 for chief complaint of right flank pain.  She is worked up and currently being treated for septic emboli, bacteremia, tricuspid valve endocarditis, bilateral shoulder and spine infections.  She is admitted to the hospital service after CT scan noted multiple pulmonary cavitary lesions felt to be from septic emboli and ensuing work-up revealed positive blood cultures for MRSA and a TEE noted tricuspid valve endocarditis and a suspected PFO as well as bilateral shoulder and spine infection.  She underwent arthrocentesis and debridement done by orthopedic surgery on 05/25/2019 and she is also seen by infectious diseases who recommended 6 weeks of IV antibiotics which she will complete in house on 06/30/2019.  She is to remain in the hospital complete the course of IV vancomycin for MRSA bacteremia given that she is an IV drug user requires supervision.  Her hospital course has also been notable for a blood transfusion and IV iron secondary to anemia of chronic disease.  She also had a UTI which was treated and a PICC line was placed for long-term IV antibiotics on 06/04/2019.  She currently complains of some chronic leg cramps and complains of difficulty sleeping.  **Interim History  She was placed on methadone and during the course of her hospitalization has been increased to 20 mg and during the course of her hospitalization has been increased to 20 mg and we will not increase further and will attempt to wean back.  She complains of some bilateral shoulder pain and left wrist pain and will obtain OT evaluation again.   Hospitalization has been complicated by some constipation   Assessment & Plan:   Principal Problem:   MRSA bacteremia Active Problems:   Injection of illicit drug within last 12 months   Hepatitis C antibody positive, s/p spontaneously cleared infection   Sepsis ([facility])   Septic pulmonary embolism ([facility])   Substance use disorder   Suspected endocarditis   IV drug abuse ([place])   Septic embolism ([facility])   Staphylococcal arthritis of right shoulder ([facility])   Staphylococcal arthritis of left wrist ([facility])   PFO (patent foramen ovale)   Endocarditis of tricuspid valve   Septic arthritis of shoulder, left ([facility])   Septic arthritis of shoulder, right ([facility])   Elevated LFTs  MRSA bacteremiasecondary to IV drug use, severe sepsis, tricuspid valve endocarditis, multilevel vertebral osteomyelitis, septic pulmonary emboli, bilateral septic shoulder arthritis and suspected left wrist septic arthrits. -Stabilized with fluids and antibiotics.  -Status post R shoulder arthrocentesis and debridement by orthopedic surgery 2/4. -Complained of Left Wrist Pain to nurse today -Will get PT/OT to evaluate and treat again  -Continue IV vancomycin per infectious disease, total 6 weeks to complete March 12.  Continue close monitoring of her renal function as she is on vancomycin along with vancomycin peaks and troughs -Not a candidate for outpatient treatment given her IVDA  IV drug use within last 12 months.  -Patient had requested to increase dose from 12.5 mg daily to 15 mg daily and now to 20 mg Daily and this was done by Dr. Nevada Crane as the patient told Dr. Nevada Crane that low-dose methadone is causing her to have excessive sweats.  We will not escalate any further and have judicious use of narcotics and attempt to wean back her methadone if able and she previously requested a slow wean from methadone and will attempt to have milligrams every 3 days if able -Currently has pain control with gabapentin 300 mg p.o. twice  daily, ibuprofen 400 mg p.o. every 6 as needed for moderate pain, along with her methadone -States that in her close environment friends and family are IVDAs. -We will need to monitor her extremely carefully  Suspected S1 nerve compressionsecondary to vertebral osteomyelitis.  -No neurologic deficit previously noted.  -Seen by neurosurgery with no intervention recommended. -Continue to monitor carefully  Anemia of Chronic Disease. -Continue with ferrous sulfate 325 mg p.o. twice daily -Status post 1 unit PRBC as well as IV iron. -Repeat Hb/Hct yesteray was 10.2/33.5 -Continue to Monitor for S/Sx of Bleeding; Currently no overt Bleeding Noted -Repeat CBC intermittently   Insomnia/sleep disturbance -Currently tried Benadryl with no help so we will switch back to Trazodone 50 mg p.o. nightly as needed  Abnormal LFTs -Transaminitis and LFTs have improved is normalized -Initially felt secondary to sepsis and possible shock liver -Liver ultrasound was benign -LFT's yesterday showed AST of 24 and ALT of 21  E. Coli UTI -She was treated with IV Rocephin course -Continue to monitor  Leg Cramps -Continue with Gabapentin 300 mg po BID  Hyperphosphatemia -Mild as Phos Level was 4.8 -Continue to Monitor closely and repeat intermittently   Constipation -Start a bowel regimen with senna docusate 1 tab p.o. twice daily, MiraLAX 17 g p.o. twice daily, bisacodyl 10 mg rectal suppository daily as needed -If not improving will give the patient an enema  DVT prophylaxis: Enoxaparin 40 mg sq q24h Code Status: FULL CODE  Family Communication: No family present at bedside  Disposition Plan: Patient is from home and she is high risk to be discharged with IV antibiotics which she will remain hospitalized until completion of her antibiotic course on 06/30/2019  Consultants:   Cardiology  Orthopedic Surgery  Cardiothoracic surgery  Infectious Diseases    Procedures:   Transfusion 1  unit packed red blood cells  2D echocardiogram done 1/29: Moderate to severe tricuspid regurg with suspicions for tricuspid endocarditis although vegetation not seen.  TEE done 1/29: Severe tricuspid valve regurg with moderately sized mobile tricuspid valve vegetation on 2 leaflets. Preserved ejection fraction  Arthroscopy of her shoulder  PICC line placed 2/14   Antimicrobials:  Anti-infectives (From admission, onward)   Start     Dose/Rate Route Frequency Ordered Stop   06/09/19 0800  vancomycin (VANCOREADY) IVPB 750 mg/150 mL     750 mg 150 mL/hr over 60 Minutes Intravenous Every 12 hours 06/08/19 1840     06/03/19 1800  vancomycin (VANCOCIN) IVPB 1000 mg/200 mL premix  Status:  Discontinued     1,000 mg 200 mL/hr over 60 Minutes Intravenous Every 12 hours 06/03/19 0456 06/08/19 1841   05/31/19 0200  vancomycin (VANCOREADY) IVPB 750 mg/150 mL  Status:  Discontinued     750 mg 150 mL/hr over 60 Minutes Intravenous Every 12 hours 05/31/19 0126 06/03/19 0456   05/30/19 2200  vancomycin (VANCOREADY) IVPB 750 mg/150 mL  Status:  Discontinued     750 mg 150 mL/hr over 60 Minutes Intravenous Every 12 hours 05/30/19 1207 05/30/19 2126   05/25/19 1230  amoxicillin (AMOXIL) capsule 500 mg     500 mg Oral  Once 05/25/19 1120 05/25/19 1435   05/21/19 1000  vancomycin (VANCOCIN) IVPB 1000 mg/200 mL premix  Status:  Discontinued     1,000 mg 200 mL/hr over 60 Minutes Intravenous Every 12 hours 05/21/19 0948 05/30/19 1207   05/20/19 1400  cefTRIAXone (ROCEPHIN) 2 g in sodium chloride 0.9 % 100 mL IVPB  Status:  Discontinued     2 g 200 mL/hr over 30 Minutes Intravenous Every 24 hours 05/20/19 1328 05/22/19 0947   05/19/19 2200  levofloxacin (LEVAQUIN) IVPB 750 mg  Status:  Discontinued     750 mg 100 mL/hr over 90 Minutes Intravenous Every 24 hours 05/19/19 0212 05/19/19 0213   05/19/19 2200  levofloxacin (LEVAQUIN) IVPB 500 mg  Status:  Discontinued     500 mg 100 mL/hr over 60 Minutes  Intravenous Every 24 hours 05/19/19 0213 05/19/19 0539   05/19/19 1000  vancomycin (VANCOREADY) IVPB 750 mg/150 mL  Status:  Discontinued     750 mg 150 mL/hr over 60 Minutes Intravenous Every 12 hours 05/19/19 0539 05/21/19 0948   05/19/19 0600  ceFEPIme (MAXIPIME) 2 g in sodium chloride 0.9 % 100 mL IVPB  Status:  Discontinued     2 g 200 mL/hr over 30 Minutes Intravenous Every 8 hours 05/19/19 0539 05/19/19 2348   05/19/19 0130  vancomycin (VANCOCIN) IVPB 1000 mg/200 mL premix     1,000 mg 200 mL/hr over 60 Minutes Intravenous  Once 05/19/19 0122 05/19/19 0253   05/18/19 2345  levofloxacin (LEVAQUIN) IVPB 750 mg     750 mg 100 mL/hr over 90 Minutes Intravenous  Once 05/18/19 2338 05/19/19 0140     Subjective: Seen and examined at bedside and she wanted to sleep and I woke her from sleep and she had no complaints then.  She later then complained to the nurse that she is having some left wrist pain.  No chest pain, lightheadedness or dizziness.  No other concerns complaints at this time.  Objective: Vitals:   06/15/19 0626 06/15/19 1400 06/15/19 2020 06/16/19 1300  BP: 100/65 110/76 106/78 (!) 128/96  Pulse: 96 (!) 102 100 (!) 101  Resp: 18 16 16 17   Temp: 98.1 F (36.7 C) 98.2 F (36.8 C) 98.5 F (36.9 C) 98.1 F (36.7 C)  TempSrc: Oral Oral Oral Oral  SpO2: 100% 100% 100% 99%  Weight:      Height:        Intake/Output Summary (Last 24 hours) at 06/16/2019 1517 Last data filed at 06/16/2019 1353 Gross per 24 hour  Intake 1321.56 ml  Output -  Net 1321.56 ml   Filed Weights   05/19/19 0400 05/24/19 0836 05/25/19 1728  Weight: 56.5 kg 56.5 kg 56.5 kg   Examination: Physical Exam:  Constitutional: Thin Caucasian female currently in no acute distress and appears calm as she is awoken from her sleep Respiratory: Appears clear to auscultation bilaterally with no appreciable wheezing, rales, rhonchi.  Unlabored breathing Cardiovascular: Slightly tachycardic but is in sinus  rhythm.  No appreciable lower extremity edema Abdomen: Soft, nontender, nondistended.  Bowel sounds positive Musculoskeletal: No contractures or cyanosis noted.  No joint deformities Skin: Has multiple skin tattoos; no appreciable rashes or lesions on limited skin eval Psychiatric: She is slightly somnolent and drowsy and has a calm and normal mood  Data Reviewed: I have personally reviewed following labs and imaging studies  CBC: Recent Labs  Lab 06/15/19 0742  WBC 5.2  NEUTROABS 2.7  HGB 10.2*  HCT 33.5*  MCV 84.6  PLT 301   Basic Metabolic Panel: Recent  Labs  Lab 06/13/19 0457 06/15/19 0742  NA 138 137  K 4.6 4.4  CL 102 102  CO2 24 25  GLUCOSE 92 86  BUN 15 14  CREATININE 0.64 0.61  CALCIUM 9.2 9.3  MG  --  1.9  PHOS  --  4.8*   GFR: Estimated Creatinine Clearance: 95 mL/min (by C-G formula based on SCr of 0.61 mg/dL). Liver Function Tests: Recent Labs  Lab 06/15/19 0742  AST 24  ALT 21  ALKPHOS 128*  BILITOT 0.5  PROT 7.3  ALBUMIN 3.0*   No results for input(s): LIPASE, AMYLASE in the last 168 hours. No results for input(s): AMMONIA in the last 168 hours. Coagulation Profile: No results for input(s): INR, PROTIME in the last 168 hours. Cardiac Enzymes: No results for input(s): CKTOTAL, CKMB, CKMBINDEX, TROPONINI in the last 168 hours. BNP (last 3 results) No results for input(s): PROBNP in the last 8760 hours. HbA1C: No results for input(s): HGBA1C in the last 72 hours. CBG: No results for input(s): GLUCAP in the last 168 hours. Lipid Profile: No results for input(s): CHOL, HDL, LDLCALC, TRIG, CHOLHDL, LDLDIRECT in the last 72 hours. Thyroid Function Tests: No results for input(s): TSH, T4TOTAL, FREET4, T3FREE, THYROIDAB in the last 72 hours. Anemia Panel: No results for input(s): VITAMINB12, FOLATE, FERRITIN, TIBC, IRON, RETICCTPCT in the last 72 hours. Sepsis Labs: No results for input(s): PROCALCITON, LATICACIDVEN in the last 168 hours.  No  results found for this or any previous visit (from the past 240 hour(s)).   Radiology Studies: No results found.  Scheduled Meds: . carvedilol  3.125 mg Oral BID WC  . enoxaparin (LOVENOX) injection  40 mg Subcutaneous Q24H  . ferrous sulfate  325 mg Oral BID  . gabapentin  300 mg Oral BID  . magnesium oxide  400 mg Oral BID  . methadone  20 mg Oral Daily  . polyethylene glycol  17 g Oral BID  . prenatal multivitamin  1 tablet Oral Daily  . senna-docusate  1 tablet Oral BID   Continuous Infusions: . lactated ringers 10 mL/hr at 05/24/19 0902  . vancomycin 750 mg (06/16/19 1114)    LOS: 28 days   Merlene Laughter, DO [facility] PAGER is on AMION  If 7PM-7AM, please contact night-coverage [email or web address]

## 2019-06-16 NOTE — Evaluation (Signed)
Physical Therapy Evaluation Patient Details Name: Christy Pearson MRN: [id number] DOB: Sep 01, 1990 Today's Date: 06/16/2019   History of Present Illness  Pt is a 29 y/o female admitted with fevere chills and back pain. 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 PMH hepatitis C, Kidney stones, IV drug abuse.     Clinical Impression  Pt presented sitting in recliner chair, awake and willing to participate in therapy session. Prior to admission, pt reported that she was independent with all functional mobility and ADLs. Pt lives with her parents and children in a single level home with a few steps to enter. At the time of evaluation, pt overall moving very well. Her main limitations and concerns are regarding her bilateral UE ROM and strength. PT provided pt with level one and level two therabands, as well as demonstrated and instructed pt in several exercises to perform. Will follow acutely to monitor progress of bilateral UE strength and HEP compliance.     Follow Up Recommendations No PT follow up    Equipment Recommendations  None recommended by PT    Recommendations for Other Services       Precautions / Restrictions Precautions Precautions: None Restrictions Weight Bearing Restrictions: Yes RUE Weight Bearing: Weight bearing as tolerated LUE Weight Bearing: Weight bearing as tolerated      Mobility  Bed Mobility               General bed mobility comments: pt OOB in recliner chair upon arrival  Transfers Overall transfer level: Independent                  Ambulation/Gait Ambulation/Gait assistance: Supervision           General Gait Details: pt ambulating around in room without difficulties - pt wanting to focus on bilateral UE therex  Stairs            Wheelchair Mobility    Modified  Rankin (Stroke Patients Only)       Balance Overall balance assessment: No apparent balance deficits (not formally assessed)                                           Pertinent Vitals/Pain Pain Assessment: Faces Faces Pain Scale: Hurts a little bit Pain Location: bilat shoulders with movement Pain Descriptors / Indicators: Sore Pain Intervention(s): Monitored during session;Repositioned    Home Living Family/patient expects to be discharged to:: Private residence Living Arrangements: Parent Available Help at Discharge: Family Type of Home: House Home Access: Stairs to enter   Secretary/administrator of Steps: 3 Home Layout: One level Home Equipment: None      Prior Function Level of Independence: Independent               Hand Dominance        Extremity/Trunk Assessment   Upper Extremity Assessment Upper Extremity Assessment: RUE deficits/detail;LUE deficits/detail RUE Deficits / Details: pt only able to achieve 90 degrees of shoulder flexion actively; passively could get to ~130 LUE Deficits / Details: pt only able to achieve 90 degrees of shoulder flexion actively; passively could get to ~130    Lower Extremity Assessment Lower Extremity Assessment: Overall WFL for tasks assessed    Cervical / Trunk Assessment Cervical / Trunk Assessment: Normal  Communication  Communication: No difficulties  Cognition Arousal/Alertness: Awake/alert Behavior During Therapy: WFL for tasks assessed/performed Overall Cognitive Status: Within Functional Limits for tasks assessed                                        General Comments      Exercises General Exercises - Upper Extremity Shoulder Flexion: AROM;AAROM;Self ROM;Both;10 reps;Standing Shoulder ABduction: AROM;Strengthening;Both;10 reps;Standing Other Exercises Other Exercises: scapular retraction bilaterally x5 Other Exercises: PT provided pt with level 1 and level 2  theraband; demonstrated shoulder scaption and horizontal abduction   Assessment/Plan    PT Assessment Patient needs continued PT services  PT Problem List Decreased strength;Decreased range of motion       PT Treatment Interventions Functional mobility training;Therapeutic activities;Therapeutic exercise;Patient/family education    PT Goals (Current goals can be found in the Care Plan section)  Acute Rehab PT Goals Patient Stated Goal: to improve arm movement and strength PT Goal Formulation: With patient Time For Goal Achievement: 06/30/19 Potential to Achieve Goals: Good    Frequency Min 3X/week   Barriers to discharge        Co-evaluation               AM-PAC PT "6 Clicks" Mobility  Outcome Measure Help needed turning from your back to your side while in a flat bed without using bedrails?: None Help needed moving from lying on your back to sitting on the side of a flat bed without using bedrails?: None Help needed moving to and from a bed to a chair (including a wheelchair)?: None Help needed standing up from a chair using your arms (e.g., wheelchair or bedside chair)?: None Help needed to walk in hospital room?: None Help needed climbing 3-5 steps with a railing? : A Little 6 Click Score: 23    End of Session   Activity Tolerance: Patient tolerated treatment well Patient left: with call bell/phone within reach;Other (comment)(standing) Nurse Communication: Mobility status PT Visit Diagnosis: Muscle weakness (generalized) (M62.81)    Time: 9326-7124 PT Time Calculation (min) (ACUTE ONLY): 26 min   Charges:   PT Evaluation $PT Eval Low Complexity: 1 Low PT Treatments $Therapeutic Exercise: 8-22 mins        Anastasio Champion, DPT  Acute Rehabilitation Services Pager [phone] [place] 06/16/2019, 4:47 PM

## 2019-06-17 ENCOUNTER — Inpatient Hospital Stay (HOSPITAL_COMMUNITY): Payer: Self-pay

## 2019-06-17 DIAGNOSIS — R7881 Bacteremia: Secondary | ICD-10-CM | POA: Diagnosis not present

## 2019-06-17 DIAGNOSIS — R7989 Other specified abnormal findings of blood chemistry: Secondary | ICD-10-CM | POA: Diagnosis not present

## 2019-06-17 DIAGNOSIS — R768 Other specified abnormal immunological findings in serum: Secondary | ICD-10-CM | POA: Diagnosis not present

## 2019-06-17 DIAGNOSIS — I079 Rheumatic tricuspid valve disease, unspecified: Secondary | ICD-10-CM | POA: Diagnosis not present

## 2019-06-17 IMAGING — DX DG ORTHOPANTOGRAM /PANORAMIC
1 series · 1 of 1 positions shown · non-contrast
Comparison: None.

CLINICAL DATA: 28-year-old female with pain and swelling in the
RIGHT jaw.

EXAM:
ORTHOPANTOGRAM/PANORAMIC

[view not recorded]
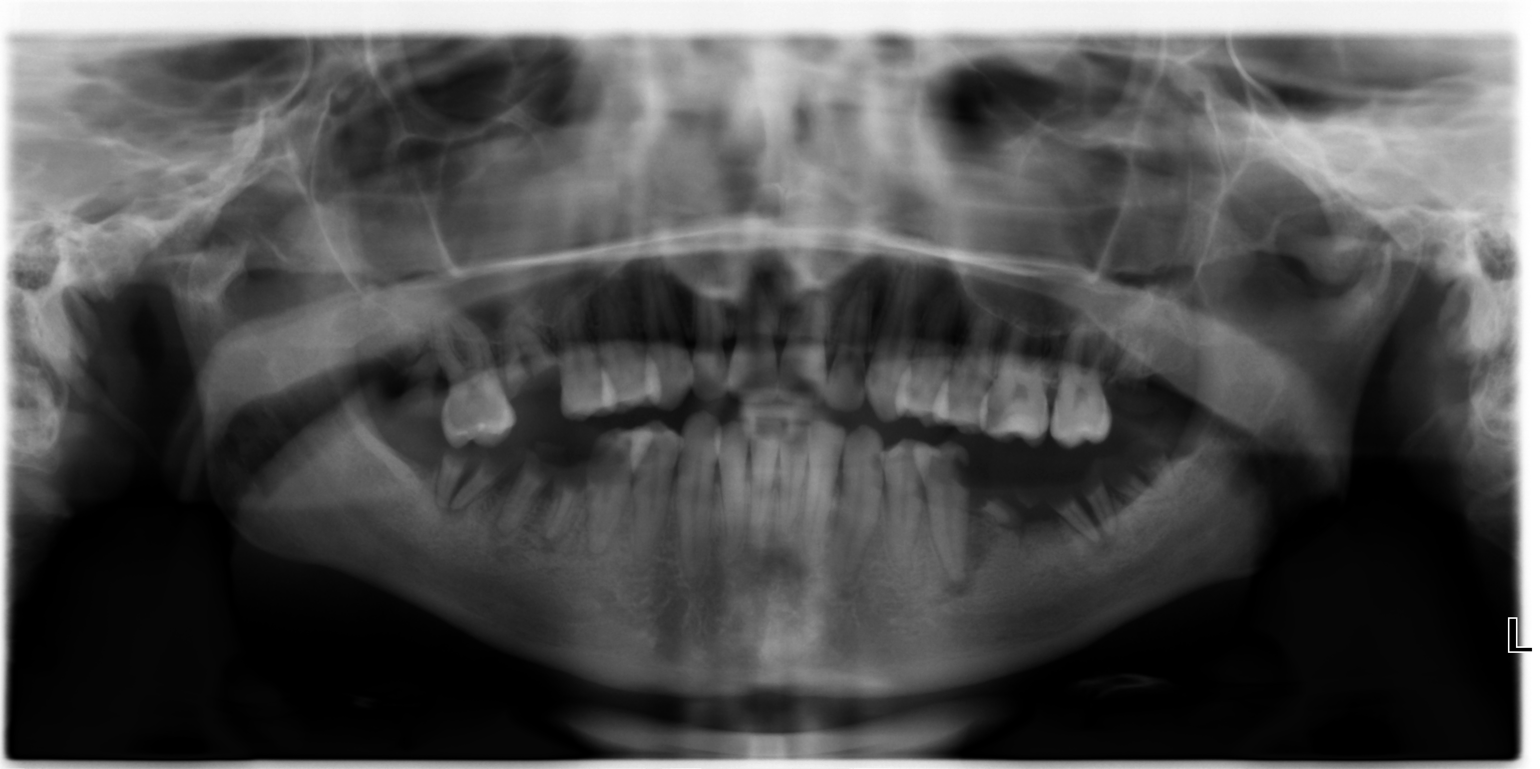

[1 of 1 positions shown; findings below may reference images not displayed]

FINDINGS: Multiple bilateral dental caries are noted.

Periapical lucency along teeth 18, 19, 20, 30 and 31 are noted
compatible with periapical abscesses.
IMPRESSION: Multiple bilateral dental caries as well as periapical abscesses of
teeth 18, 19, 20, 30 and 31.

## 2019-06-17 NOTE — Progress Notes (Signed)
PROGRESS NOTE    Tiney Zipper  SHF:[id number] DOB: [birth year] DOA: 05/18/2019 PCP: Patient, No Pcp Per  Brief Narrative:  The patient is a 29 year old Caucasian female with a past medical history significant for but not limited to hepatitis C with spontaneous clearance, polysubstance abuse with heroin and crystal meth along with multiple complications who was admitted on 05/18/2019 for chief complaint of right flank pain.  She is worked up and currently being treated for septic emboli, bacteremia, tricuspid valve endocarditis, bilateral shoulder and spine infections.  She is admitted to the hospital service after CT scan noted multiple pulmonary cavitary lesions felt to be from septic emboli and ensuing work-up revealed positive blood cultures for MRSA and a TEE noted tricuspid valve endocarditis and a suspected PFO as well as bilateral shoulder and spine infection.  She underwent arthrocentesis and debridement done by orthopedic surgery on 05/25/2019 and she is also seen by infectious diseases who recommended 6 weeks of IV antibiotics which she will complete in house on 06/30/2019.  She is to remain in the hospital complete the course of IV vancomycin for MRSA bacteremia given that she is an IV drug user requires supervision.  Her hospital course has also been notable for a blood transfusion and IV iron secondary to anemia of chronic disease.  She also had a UTI which was treated and a PICC line was placed for long-term IV antibiotics on 06/04/2019.  She currently complains of some chronic leg cramps and complains of difficulty sleeping.  **Interim History  She was placed on methadone and during the course of her hospitalization has been increased to 20 mg and during the course of her hospitalization has been increased to 20 mg and we will not increase further and will attempt to wean back.  She complains of some bilateral shoulder pain and left wrist pain and will obtain OT evaluation again.  PT  recommends follow-up.  Hospitalization has been complicated by some constipation   Assessment & Plan:   Principal Problem:   MRSA bacteremia Active Problems:   Injection of illicit drug within last 12 months   Hepatitis C antibody positive, s/p spontaneously cleared infection   Sepsis (HCC)   Septic pulmonary embolism (HCC)   Substance use disorder   Suspected endocarditis   IV drug abuse (HCC)   Septic embolism (HCC)   Staphylococcal arthritis of right shoulder (HCC)   Staphylococcal arthritis of left wrist (HCC)   PFO (patent foramen ovale)   Endocarditis of tricuspid valve   Septic arthritis of shoulder, left (HCC)   Septic arthritis of shoulder, right (HCC)   Elevated LFTs  MRSA bacteremiasecondary to IV drug use, severe sepsis, tricuspid valve endocarditis, multilevel vertebral osteomyelitis, septic pulmonary emboli, bilateral septic shoulder arthritis and suspected left wrist septic arthrits. -Stabilized with fluids and antibiotics.  -Status post R shoulder arthrocentesis and debridement by orthopedic surgery 2/4. -Complained of Left Wrist Pain to nurse today -Will get PT/OT to evaluate and treat again; PT recommends no follow-up and OT to still reevaluate -Continue IV vancomycin per infectious disease, total 6 weeks to complete March 12.  Continue close monitoring of her renal function as she is on vancomycin along with vancomycin peaks and troughs -Not a candidate for outpatient treatment given her IVDA  IV drug use within last 12 months.  -Patient had requested to increase dose from 12.5 mg daily to 15 mg daily and now to 20 mg Daily and this was done by Dr. Margo Aye as the patient told Dr.  Hall that low-dose methadone is causing her to have excessive sweats.  We will not escalate any further and have judicious use of narcotics and attempt to wean back her methadone if able and she previously requested a slow wean from methadone and will attempt to have milligrams every 3 days  if able -Currently has pain control with gabapentin 300 mg p.o. twice daily, ibuprofen 400 mg p.o. every 6 as needed for moderate pain, along with her methadone -States that in her close environment friends and family are IVDAs. -We will need to monitor her extremely carefully  Suspected S1 nerve compressionsecondary to vertebral osteomyelitis.  -No neurologic deficit previously noted.  -Seen by neurosurgery with no intervention recommended. -Continue to monitor carefully  Anemia of Chronic Disease. -Continue with ferrous sulfate 325 mg p.o. twice daily -Status post 1 unit PRBC as well as IV iron. -Repeat Hb/Hct the day before yesterday was 10.2/33.5 -Continue to Monitor for S/Sx of Bleeding; Currently no overt Bleeding Noted -Repeat CBC intermittently   Insomnia/sleep disturbance -Switched back to Trazodone 50 mg p.o. nightly as needed  Abnormal LFTs -Transaminitis and LFTs have improved is normalized -Initially felt secondary to sepsis and possible shock liver -Liver ultrasound was benign -LFT's the day before yesterday showed AST of 24 and ALT of 21  E. Coli UTI -She was treated with IV Rocephin course -Continue to monitor  Leg Cramps -Continue with Gabapentin 300 mg po BID  Hyperphosphatemia -Mild as Phos Level was 4.8 on 06/15/2019 -Continue to Monitor closely and repeat intermittently   Constipation -Start a bowel regimen with senna docusate 1 tab p.o. twice daily, MiraLAX 17 g p.o. twice daily, bisacodyl 10 mg rectal suppository daily as needed -If not improving will give the patient an enema  Dental Pain -Ordered an Orthopantogram to evaluate -Pending Results may need In house Dentistry Evaluation   DVT prophylaxis: Enoxaparin 40 mg sq q24h Code Status: FULL CODE  Family Communication: No family present at bedside  Disposition Plan: Patient is from home and she is high risk to be discharged with IV antibiotics which she will remain hospitalized until  completion of her antibiotic course on 06/30/2019  Consultants:   Cardiology  Orthopedic Surgery  Cardiothoracic surgery  Infectious Diseases    Procedures:   Transfusion 1 unit packed red blood cells  2D echocardiogram done 1/29: Moderate to severe tricuspid regurg with suspicions for tricuspid endocarditis although vegetation not seen.  TEE done 1/29: Severe tricuspid valve regurg with moderately sized mobile tricuspid valve vegetation on 2 leaflets. Preserved ejection fraction  Arthroscopy of her shoulder  PICC line placed 2/14   Antimicrobials:  Anti-infectives (From admission, onward)   Start     Dose/Rate Route Frequency Ordered Stop   06/09/19 0800  vancomycin (VANCOREADY) IVPB 750 mg/150 mL     750 mg 150 mL/hr over 60 Minutes Intravenous Every 12 hours 06/08/19 1840     06/03/19 1800  vancomycin (VANCOCIN) IVPB 1000 mg/200 mL premix  Status:  Discontinued     1,000 mg 200 mL/hr over 60 Minutes Intravenous Every 12 hours 06/03/19 0456 06/08/19 1841   05/31/19 0200  vancomycin (VANCOREADY) IVPB 750 mg/150 mL  Status:  Discontinued     750 mg 150 mL/hr over 60 Minutes Intravenous Every 12 hours 05/31/19 0126 06/03/19 0456   05/30/19 2200  vancomycin (VANCOREADY) IVPB 750 mg/150 mL  Status:  Discontinued     750 mg 150 mL/hr over 60 Minutes Intravenous Every 12 hours 05/30/19 1207 05/30/19 2126  05/25/19 1230  amoxicillin (AMOXIL) capsule 500 mg     500 mg Oral  Once 05/25/19 1120 05/25/19 1435   05/21/19 1000  vancomycin (VANCOCIN) IVPB 1000 mg/200 mL premix  Status:  Discontinued     1,000 mg 200 mL/hr over 60 Minutes Intravenous Every 12 hours 05/21/19 0948 05/30/19 1207   05/20/19 1400  cefTRIAXone (ROCEPHIN) 2 g in sodium chloride 0.9 % 100 mL IVPB  Status:  Discontinued     2 g 200 mL/hr over 30 Minutes Intravenous Every 24 hours 05/20/19 1328 05/22/19 0947   05/19/19 2200  levofloxacin (LEVAQUIN) IVPB 750 mg  Status:  Discontinued     750 mg 100 mL/hr  over 90 Minutes Intravenous Every 24 hours 05/19/19 0212 05/19/19 0213   05/19/19 2200  levofloxacin (LEVAQUIN) IVPB 500 mg  Status:  Discontinued     500 mg 100 mL/hr over 60 Minutes Intravenous Every 24 hours 05/19/19 0213 05/19/19 0539   05/19/19 1000  vancomycin (VANCOREADY) IVPB 750 mg/150 mL  Status:  Discontinued     750 mg 150 mL/hr over 60 Minutes Intravenous Every 12 hours 05/19/19 0539 05/21/19 0948   05/19/19 0600  ceFEPIme (MAXIPIME) 2 g in sodium chloride 0.9 % 100 mL IVPB  Status:  Discontinued     2 g 200 mL/hr over 30 Minutes Intravenous Every 8 hours 05/19/19 0539 05/19/19 2348   05/19/19 0130  vancomycin (VANCOCIN) IVPB 1000 mg/200 mL premix     1,000 mg 200 mL/hr over 60 Minutes Intravenous  Once 05/19/19 0122 05/19/19 0253   05/18/19 2345  levofloxacin (LEVAQUIN) IVPB 750 mg     750 mg 100 mL/hr over 90 Minutes Intravenous  Once 05/18/19 2338 05/19/19 0140     Subjective: Seen and examined at bedside and she is much more awake and alert today and she is eating breakfast.  She is complaining of some shoulder soreness on the right, worse compared to left and had some left wrist tenderness.  Asking her grandmother to bring to pelvis so she can gain strength in her upper extremities.  No nausea or vomiting.  States that she felt okay.  No other concerns or complaints at this time but later told the nurse she was having some dental pain.   Objective: Vitals:   06/15/19 2020 06/16/19 1300 06/16/19 2030 06/17/19 0648  BP: 106/78 (!) 128/96 118/89 110/71  Pulse: 100 (!) 101 100 97  Resp: 16 17 17 17   Temp: 98.5 F (36.9 C) 98.1 F (36.7 C) 98.4 F (36.9 C) 98.1 F (36.7 C)  TempSrc: Oral Oral Oral Oral  SpO2: 100% 99% 100% 98%  Weight:      Height:        Intake/Output Summary (Last 24 hours) at 06/17/2019 1253 Last data filed at 06/17/2019 1012 Gross per 24 hour  Intake 1610.12 ml  Output --  Net 1610.12 ml   Filed Weights   05/19/19 0400 05/24/19 0836  05/25/19 1728  Weight: 56.5 kg 56.5 kg 56.5 kg   Examination: Physical Exam:  Constitutional: The patient is a thin Caucasian female currently in no acute distress she appears calm and she is awake and alert eating her breakfast Respiratory: Appears clear to auscultation bilaterally no appreciable wheezing, rales, rhonchi.  Patient had unlabored breathing Cardiovascular: Mildly tachycardic but is in sinus rhythm.  No appreciable lower extremity edema noted Abdomen: Soft, nontender, nondistended.  Bowel sounds present Musculoskeletal: No contractures or cyanosis.  No joint deformities noted. Skin: Multiple skin  tattoos diffusely scattered throughout her body and on her feet and arms.  No appreciable rashes or lesions on skin evaluation Psychiatric: Patient is awake and alert and in no acute distress  Data Reviewed: I have personally reviewed following labs and imaging studies  CBC: Recent Labs  Lab 06/15/19 0742  WBC 5.2  NEUTROABS 2.7  HGB 10.2*  HCT 33.5*  MCV 84.6  PLT 301   Basic Metabolic Panel: Recent Labs  Lab 06/13/19 0457 06/15/19 0742  NA 138 137  K 4.6 4.4  CL 102 102  CO2 24 25  GLUCOSE 92 86  BUN 15 14  CREATININE 0.64 0.61  CALCIUM 9.2 9.3  MG  --  1.9  PHOS  --  4.8*   GFR: Estimated Creatinine Clearance: 95 mL/min (by C-G formula based on SCr of 0.61 mg/dL). Liver Function Tests: Recent Labs  Lab 06/15/19 0742  AST 24  ALT 21  ALKPHOS 128*  BILITOT 0.5  PROT 7.3  ALBUMIN 3.0*   No results for input(s): LIPASE, AMYLASE in the last 168 hours. No results for input(s): AMMONIA in the last 168 hours. Coagulation Profile: No results for input(s): INR, PROTIME in the last 168 hours. Cardiac Enzymes: No results for input(s): CKTOTAL, CKMB, CKMBINDEX, TROPONINI in the last 168 hours. BNP (last 3 results) No results for input(s): PROBNP in the last 8760 hours. HbA1C: No results for input(s): HGBA1C in the last 72 hours. CBG: No results for  input(s): GLUCAP in the last 168 hours. Lipid Profile: No results for input(s): CHOL, HDL, LDLCALC, TRIG, CHOLHDL, LDLDIRECT in the last 72 hours. Thyroid Function Tests: No results for input(s): TSH, T4TOTAL, FREET4, T3FREE, THYROIDAB in the last 72 hours. Anemia Panel: No results for input(s): VITAMINB12, FOLATE, FERRITIN, TIBC, IRON, RETICCTPCT in the last 72 hours. Sepsis Labs: No results for input(s): PROCALCITON, LATICACIDVEN in the last 168 hours.  No results found for this or any previous visit (from the past 240 hour(s)).   Radiology Studies: No results found.  Scheduled Meds: . carvedilol  3.125 mg Oral BID WC  . enoxaparin (LOVENOX) injection  40 mg Subcutaneous Q24H  . ferrous sulfate  325 mg Oral BID  . gabapentin  300 mg Oral BID  . magnesium oxide  400 mg Oral BID  . methadone  20 mg Oral Daily  . polyethylene glycol  17 g Oral BID  . prenatal multivitamin  1 tablet Oral Daily  . senna-docusate  1 tablet Oral BID   Continuous Infusions: . lactated ringers 10 mL/hr at 05/24/19 0902  . vancomycin 750 mg (06/17/19 0912)    LOS: 29 days   Merlene Laughter, DO [facility] PAGER is on AMION  If 7PM-7AM, please contact night-coverage [email or web address]

## 2019-06-18 DIAGNOSIS — K047 Periapical abscess without sinus: Secondary | ICD-10-CM

## 2019-06-18 DIAGNOSIS — R7881 Bacteremia: Secondary | ICD-10-CM

## 2019-06-18 DIAGNOSIS — R7989 Other specified abnormal findings of blood chemistry: Secondary | ICD-10-CM

## 2019-06-18 DIAGNOSIS — Q211 Atrial septal defect: Secondary | ICD-10-CM

## 2019-06-18 DIAGNOSIS — I079 Rheumatic tricuspid valve disease, unspecified: Secondary | ICD-10-CM | POA: Diagnosis not present

## 2019-06-18 DIAGNOSIS — F1990 Other psychoactive substance use, unspecified, uncomplicated: Secondary | ICD-10-CM

## 2019-06-18 DIAGNOSIS — K0889 Other specified disorders of teeth and supporting structures: Secondary | ICD-10-CM

## 2019-06-18 DIAGNOSIS — I76 Septic arterial embolism: Secondary | ICD-10-CM

## 2019-06-18 DIAGNOSIS — R768 Other specified abnormal immunological findings in serum: Secondary | ICD-10-CM | POA: Diagnosis not present

## 2019-06-18 DIAGNOSIS — R0989 Other specified symptoms and signs involving the circulatory and respiratory systems: Secondary | ICD-10-CM

## 2019-06-18 DIAGNOSIS — K029 Dental caries, unspecified: Secondary | ICD-10-CM

## 2019-06-18 DIAGNOSIS — M00012 Staphylococcal arthritis, left shoulder: Secondary | ICD-10-CM

## 2019-06-18 DIAGNOSIS — B9562 Methicillin resistant Staphylococcus aureus infection as the cause of diseases classified elsewhere: Secondary | ICD-10-CM

## 2019-06-18 DIAGNOSIS — I2690 Septic pulmonary embolism without acute cor pulmonale: Secondary | ICD-10-CM

## 2019-06-18 DIAGNOSIS — Z720 Tobacco use: Secondary | ICD-10-CM

## 2019-06-18 DIAGNOSIS — M00011 Staphylococcal arthritis, right shoulder: Secondary | ICD-10-CM

## 2019-06-18 DIAGNOSIS — M00032 Staphylococcal arthritis, left wrist: Secondary | ICD-10-CM

## 2019-06-18 LAB — HCG, QUANTITATIVE, PREGNANCY: hCG, Beta Chain, Quant, S: 1 m[IU]/mL (ref <5)

## 2019-06-18 LAB — PREGNANCY, URINE: Preg Test, Ur: NEGATIVE

## 2019-06-18 MED ORDER — NICOTINE 14 MG/24HR TD PT24
14.0000 mg | MEDICATED_PATCH | Freq: Every day | TRANSDERMAL | Status: DC
  Administered 2019-06-18 – 2019-06-27 (×10): 14 mg via TRANSDERMAL
  Filled 2019-06-18 (×11): qty 1

## 2019-06-18 MED ORDER — VANCOMYCIN HCL 750 MG/150ML IV SOLN
750.0000 mg | Freq: Two times a day (BID) | INTRAVENOUS | Status: DC
  Filled 2019-06-18: qty 150

## 2019-06-18 MED ORDER — VANCOMYCIN HCL 750 MG/150ML IV SOLN
750.0000 mg | Freq: Two times a day (BID) | INTRAVENOUS | Status: DC
  Administered 2019-06-18 – 2019-06-19 (×2): 750 mg via INTRAVENOUS
  Filled 2019-06-18 (×3): qty 150

## 2019-06-18 NOTE — Progress Notes (Signed)
Pharmacy Antibiotic Note  Christy Pearson is a 29 y.o. female admitted on 05/18/2019 with Metastatic MRSA bacteremia and TV endocarditis with PFO, BL cavitary septic emboli, BL septic shoulders, possibly septic wrist and L shoulder osteomyelitis, and discitis. Pharmacy has been consulted for vancomycin dosing.  Today, patient continues to be afebrile, last WBC wnl, last SCr stable at 0.64. This morning vancomycin dose was administered late due to patient preference. Vanc labs timed around next dose.  Plan: Continue vancomycin 750mg  IV q12h ID recommendation for 6 weeks IV antibiotics to complete 3/12. Obtain vanc peak and trough at next dose to assess dosing regimen Continue to Monitor clinical picture, renal function, F/U C&S  Antimicrobials this admission Vancomycin 1/29 >>(3/12) CTX 1/30>>2/1 for UTI Cefepime 1/29 x3 doses 2/4: Amoxicillin X 1 to document tolerance (Patient has taken in the past with no issues)  2/22:  VP 27, VT 10,  AUC 404 - continue 750mg  q12h   1/28 Ucx: ecoli 1/28 Bcx: MRSA 1/29 Bcx x 2: neg 1/29 resp panel - neg 2/4 R & L shoulder cx - MRSA   Height: 5' 7.01" (170.2 cm) Weight: 124 lb 9 oz (56.5 kg) IBW/kg (Calculated) : 61.62  Temp (24hrs), Avg:98.6 F (37 C), Min:98.4 F (36.9 C), Max:98.7 F (37.1 C)  Recent Labs  Lab 06/12/19 1931 06/12/19 2212 06/13/19 0457 06/15/19 0742  WBC  --   --   --  5.2  CREATININE  --   --  0.64 0.61  VANCOPEAK 10* 27*  --   --     Estimated Creatinine Clearance: 95 mL/min (by C-G formula based on SCr of 0.61 mg/dL).    Allergies  Allergen Reactions  . Tylenol [Acetaminophen] Rash    Pt just reported tylenol allergy at this visit at 2338pm   06/15/19, PharmD PGY2 Pharmacy Resident Phone [phone]  06/18/2019   12:32 PM

## 2019-06-18 NOTE — Progress Notes (Signed)
PROGRESS NOTE    Christy Pearson  FBP:[id number] DOB: March 22, 1991 DOA: 05/18/2019 PCP: Patient, No Pcp Per  Brief Narrative:  The patient is a 29 year old Caucasian female with a past medical history significant for but not limited to hepatitis C with spontaneous clearance, polysubstance abuse with heroin and crystal meth along with multiple complications who was admitted on 05/18/2019 for chief complaint of right flank pain.  She is worked up and currently being treated for septic emboli, bacteremia, tricuspid valve endocarditis, bilateral shoulder and spine infections.  She is admitted to the hospital service after CT scan noted multiple pulmonary cavitary lesions felt to be from septic emboli and ensuing work-up revealed positive blood cultures for MRSA and a TEE noted tricuspid valve endocarditis and a suspected PFO as well as bilateral shoulder and spine infection.  She underwent arthrocentesis and debridement done by orthopedic surgery on 05/25/2019 and she is also seen by infectious diseases who recommended 6 weeks of IV antibiotics which she will complete in house on 06/30/2019.  She is to remain in the hospital complete the course of IV vancomycin for MRSA bacteremia given that she is an IV drug user requires supervision.  Her hospital course has also been notable for a blood transfusion and IV iron secondary to anemia of chronic disease.  She also had a UTI which was treated and a PICC line was placed for long-term IV antibiotics on 06/04/2019.  She currently complains of some chronic leg cramps and complains of difficulty sleeping.  **Interim History  She was placed on methadone and during the course of her hospitalization has been increased to 20 mg and during the course of her hospitalization has been increased to 20 mg and we will not increase further and will attempt to wean back.  She complains of some bilateral shoulder pain and left wrist pain and will obtain OT evaluation again.  PT  recommends follow-up.  Hospitalization has been complicated by some constipation and some dental pain.  Now she also is requesting a nicotine patch given that she is constantly eating.   Assessment & Plan:   Principal Problem:   MRSA bacteremia Active Problems:   Injection of illicit drug within last 12 months   Hepatitis C antibody positive, s/p spontaneously cleared infection   Sepsis ([place])   Septic pulmonary embolism ([place])   Substance use disorder   Suspected endocarditis   IV drug abuse ([place])   Septic embolism ([facility])   Staphylococcal arthritis of right shoulder ([facility])   Staphylococcal arthritis of left wrist ([facility])   PFO (patent foramen ovale)   Endocarditis of tricuspid valve   Septic arthritis of shoulder, left ([facility])   Septic arthritis of shoulder, right (H[facility])   Elevated LFTs  MRSA bacteremiasecondary to IV drug use, severe sepsis, tricuspid valve endocarditis, multilevel vertebral osteomyelitis, septic pulmonary emboli, bilateral septic shoulder arthritis and suspected left wrist septic arthrits. -Stabilized with fluids and antibiotics.  -Status post R shoulder arthrocentesis and debridement by orthopedic surgery 2/4. -Complained of Left Wrist Pain to nurse today -Will get PT/OT to evaluate and treat again; PT recommends no follow-up and OT to still reevaluate -Continue IV vancomycin per infectious disease, total 6 weeks to complete March 12.  Continue close monitoring of her renal function as she is on vancomycin along with vancomycin peaks and troughs -Not a candidate for outpatient treatment given her IVDA  IV drug use within last 12 months.  -Patient had requested to increase dose from 12.5 mg daily to 15 mg  daily and now to 20 mg Daily and this was done by Dr. Margo Aye as the patient told Dr. Margo Aye that low-dose methadone is causing her to have excessive sweats.  We will not escalate any further and have judicious use of narcotics and attempt to wean back her methadone if able  and she previously requested a slow wean from methadone and will attempt to have milligrams every 3 days if able -Currently has pain control with gabapentin 300 mg p.o. twice daily, ibuprofen 400 mg p.o. every 6 as needed for moderate pain, along with her methadone -States that in her close environment friends and family are IVDAs. -We will need to monitor her extremely carefully  Suspected S1 nerve compressionsecondary to vertebral osteomyelitis.  -No neurologic deficit previously noted.  -Seen by neurosurgery with no intervention recommended. -Continue to monitor carefully  Anemia of Chronic Disease. -Continue with ferrous sulfate 325 mg p.o. twice daily -Status post 1 unit PRBC as well as IV iron. -Last Hemoglobin/Hematocrit 10.2/33.5 -Continue to Monitor for S/Sx of Bleeding; Currently no overt Bleeding Noted -Repeat CBC intermittently and repeat in a.m.  Insomnia/sleep disturbance -Switched back to Trazodone 50 mg p.o. nightly as needed  Abnormal LFTs -Transaminitis and LFTs have improved is normalized -Initially felt secondary to sepsis and possible shock liver -Liver ultrasound was benign -On last LFT check showed AST of 24 and ALT of 21  E. Coli UTI -She was treated with IV Rocephin course -Continue to monitor  Leg Cramps -Continue with Gabapentin 300 mg po BID  Hyperphosphatemia -Mild as Phos Level was 4.8 on 06/15/2019 -Continue to Monitor closely and repeat intermittently will repeat in a.m.  Constipation -Start a bowel regimen with senna docusate 1 tab p.o. twice daily, MiraLAX 17 g p.o. twice daily, bisacodyl 10 mg rectal suppository daily as needed -If not improving will give the patient an enema  Dental Pain in the setting of her multiple dental caries as well as periapical abscesses -Ordered an Orthopantogram to evaluate and showed "Multiple bilateral dental caries as well as periapical abscesses of teeth 18, 19, 20, 30 and 31." -Have Consulted Dr. Dian Queen of Dentistry to further evaluate and left VM on his office phone and sent him a message via Epic Chat'  Tobacco abuse -Patient states that she smokes 1 pack/day -We will start nicotine patch 14 mg transdermal daily -Smoking cessation counseling given  DVT prophylaxis: Enoxaparin 40 mg sq q24h Code Status: FULL CODE  Family Communication: No family present at bedside  Disposition Plan: Patient is from home and she is high risk to be discharged with IV antibiotics which she will remain hospitalized until completion of her antibiotic course on 06/30/2019  Consultants:   Cardiology  Orthopedic Surgery  Cardiothoracic surgery  Infectious Diseases    Procedures:   Transfusion 1 unit packed red blood cells  2D echocardiogram done 1/29: Moderate to severe tricuspid regurg with suspicions for tricuspid endocarditis although vegetation not seen.  TEE done 1/29: Severe tricuspid valve regurg with moderately sized mobile tricuspid valve vegetation on 2 leaflets. Preserved ejection fraction  Arthroscopy of her shoulder  PICC line placed 2/14   Antimicrobials:  Anti-infectives (From admission, onward)   Start     Dose/Rate Route Frequency Ordered Stop   06/09/19 0800  vancomycin (VANCOREADY) IVPB 750 mg/150 mL     750 mg 150 mL/hr over 60 Minutes Intravenous Every 12 hours 06/08/19 1840     06/03/19 1800  vancomycin (VANCOCIN) IVPB 1000 mg/200 mL premix  Status:  Discontinued     1,000 mg 200 mL/hr over 60 Minutes Intravenous Every 12 hours 06/03/19 0456 06/08/19 1841   05/31/19 0200  vancomycin (VANCOREADY) IVPB 750 mg/150 mL  Status:  Discontinued     750 mg 150 mL/hr over 60 Minutes Intravenous Every 12 hours 05/31/19 0126 06/03/19 0456   05/30/19 2200  vancomycin (VANCOREADY) IVPB 750 mg/150 mL  Status:  Discontinued     750 mg 150 mL/hr over 60 Minutes Intravenous Every 12 hours 05/30/19 1207 05/30/19 2126   05/25/19 1230  amoxicillin (AMOXIL) capsule 500 mg      500 mg Oral  Once 05/25/19 1120 05/25/19 1435   05/21/19 1000  vancomycin (VANCOCIN) IVPB 1000 mg/200 mL premix  Status:  Discontinued     1,000 mg 200 mL/hr over 60 Minutes Intravenous Every 12 hours 05/21/19 0948 05/30/19 1207   05/20/19 1400  cefTRIAXone (ROCEPHIN) 2 g in sodium chloride 0.9 % 100 mL IVPB  Status:  Discontinued     2 g 200 mL/hr over 30 Minutes Intravenous Every 24 hours 05/20/19 1328 05/22/19 0947   05/19/19 2200  levofloxacin (LEVAQUIN) IVPB 750 mg  Status:  Discontinued     750 mg 100 mL/hr over 90 Minutes Intravenous Every 24 hours 05/19/19 0212 05/19/19 0213   05/19/19 2200  levofloxacin (LEVAQUIN) IVPB 500 mg  Status:  Discontinued     500 mg 100 mL/hr over 60 Minutes Intravenous Every 24 hours 05/19/19 0213 05/19/19 0539   05/19/19 1000  vancomycin (VANCOREADY) IVPB 750 mg/150 mL  Status:  Discontinued     750 mg 150 mL/hr over 60 Minutes Intravenous Every 12 hours 05/19/19 0539 05/21/19 0948   05/19/19 0600  ceFEPIme (MAXIPIME) 2 g in sodium chloride 0.9 % 100 mL IVPB  Status:  Discontinued     2 g 200 mL/hr over 30 Minutes Intravenous Every 8 hours 05/19/19 0539 05/19/19 2348   05/19/19 0130  vancomycin (VANCOCIN) IVPB 1000 mg/200 mL premix     1,000 mg 200 mL/hr over 60 Minutes Intravenous  Once 05/19/19 0122 05/19/19 0253   05/18/19 2345  levofloxacin (LEVAQUIN) IVPB 750 mg     750 mg 100 mL/hr over 90 Minutes Intravenous  Once 05/18/19 2338 05/19/19 0140     Subjective: Seen and examined at bedside was complaining of some shoulder soreness.  Also complained of some dental pain.  I spoke to her and she states that she smokes 1 pack/day and has not had a cigarette in a while and has been eating quite a bit.  Requesting nicotine patch.  No nausea or vomiting.  States that she is having some lower back pain that felt similar to her prior episodes but states that the heating pad helps.  No other concerns or complaints at this time.   Objective: Vitals:    06/17/19 0648 06/17/19 1500 06/17/19 2016 06/18/19 0631  BP: 110/71 110/65 126/87 108/80  Pulse: 97 100 (!) 101 98  Resp: 17 17 20 18   Temp: 98.1 F (36.7 C) 98.4 F (36.9 C) 98.6 F (37 C) 98.7 F (37.1 C)  TempSrc: Oral Oral Oral Oral  SpO2: 98% 100% 100% 100%  Weight:      Height:        Intake/Output Summary (Last 24 hours) at 06/18/2019 0827 Last data filed at 06/17/2019 1634 Gross per 24 hour  Intake 940 ml  Output -  Net 940 ml   Filed Weights   05/19/19 0400 05/24/19 0836 05/25/19  1728  Weight: 56.5 kg 56.5 kg 56.5 kg   Examination: Physical Exam:  Constitutional: Thin Caucasian female currently in no acute distress appears calm but does not seem a little uncomfortable complain of some back pain. Respiratory: Slightly diminished to auscultation but no appreciable wheezing, rales, rhonchi.  Patient has unlabored breathing Cardiovascular: Slightly faster side but now is in sinus rhythm.  Has some mild pedal edema Abdomen: Soft, nontender, nondistended.  Bowel sounds present Musculoskeletal: No contractures or cyanosis.  No joint deformities noted. Skin: Has multiple skin tattoos diffusely scattered throughout her body on her feet arms and upper chest wall.  No appreciable rashes or lesions on obtain consolidation Psychiatric: Has a pleasant mood and affect she is awake and alert and oriented.  Data Reviewed: I have personally reviewed following labs and imaging studies  CBC: Recent Labs  Lab 06/15/19 0742  WBC 5.2  NEUTROABS 2.7  HGB 10.2*  HCT 33.5*  MCV 84.6  PLT 301   Basic Metabolic Panel: Recent Labs  Lab 06/13/19 0457 06/15/19 0742  NA 138 137  K 4.6 4.4  CL 102 102  CO2 24 25  GLUCOSE 92 86  BUN 15 14  CREATININE 0.64 0.61  CALCIUM 9.2 9.3  MG  --  1.9  PHOS  --  4.8*   GFR: Estimated Creatinine Clearance: 95 mL/min (by C-G formula based on SCr of 0.61 mg/dL). Liver Function Tests: Recent Labs  Lab 06/15/19 0742  AST 24  ALT 21   ALKPHOS 128*  BILITOT 0.5  PROT 7.3  ALBUMIN 3.0*   No results for input(s): LIPASE, AMYLASE in the last 168 hours. No results for input(s): AMMONIA in the last 168 hours. Coagulation Profile: No results for input(s): INR, PROTIME in the last 168 hours. Cardiac Enzymes: No results for input(s): CKTOTAL, CKMB, CKMBINDEX, TROPONINI in the last 168 hours. BNP (last 3 results) No results for input(s): PROBNP in the last 8760 hours. HbA1C: No results for input(s): HGBA1C in the last 72 hours. CBG: No results for input(s): GLUCAP in the last 168 hours. Lipid Profile: No results for input(s): CHOL, HDL, LDLCALC, TRIG, CHOLHDL, LDLDIRECT in the last 72 hours. Thyroid Function Tests: No results for input(s): TSH, T4TOTAL, FREET4, T3FREE, THYROIDAB in the last 72 hours. Anemia Panel: No results for input(s): VITAMINB12, FOLATE, FERRITIN, TIBC, IRON, RETICCTPCT in the last 72 hours. Sepsis Labs: No results for input(s): PROCALCITON, LATICACIDVEN in the last 168 hours.  No results found for this or any previous visit (from the past 240 hour(s)).   Radiology Studies: DG Orthopantogram  Result Date: 06/17/2019 CLINICAL DATA:  29 year old female with pain and swelling in the RIGHT jaw. EXAM: ORTHOPANTOGRAM/PANORAMIC COMPARISON:  None. FINDINGS: Multiple bilateral dental caries are noted. Periapical lucency along teeth 18, 19, 20, 30 and 31 are noted compatible with periapical abscesses. IMPRESSION: Multiple bilateral dental caries as well as periapical abscesses of teeth 18, 19, 20, 30 and 31. Electronically Signed   By: Harmon Pier M.D.   On: 06/17/2019 16:09    Scheduled Meds: . carvedilol  3.125 mg Oral BID WC  . enoxaparin (LOVENOX) injection  40 mg Subcutaneous Q24H  . ferrous sulfate  325 mg Oral BID  . gabapentin  300 mg Oral BID  . magnesium oxide  400 mg Oral BID  . methadone  20 mg Oral Daily  . polyethylene glycol  17 g Oral BID  . prenatal multivitamin  1 tablet Oral Daily  .  senna-docusate  1 tablet  Oral BID   Continuous Infusions: . lactated ringers 10 mL/hr at 05/24/19 0902  . vancomycin 750 mg (06/17/19 2125)    LOS: 30 days   Merlene Laughter, DO [facility] PAGER is on AMION  If 7PM-7AM, please contact night-coverage [email or web address]

## 2019-06-19 ENCOUNTER — Encounter (HOSPITAL_COMMUNITY): Payer: Self-pay | Admitting: Emergency Medicine

## 2019-06-19 ENCOUNTER — Inpatient Hospital Stay (HOSPITAL_COMMUNITY): Payer: Self-pay

## 2019-06-19 DIAGNOSIS — I079 Rheumatic tricuspid valve disease, unspecified: Secondary | ICD-10-CM | POA: Diagnosis not present

## 2019-06-19 DIAGNOSIS — R7881 Bacteremia: Secondary | ICD-10-CM | POA: Diagnosis not present

## 2019-06-19 DIAGNOSIS — R768 Other specified abnormal immunological findings in serum: Secondary | ICD-10-CM | POA: Diagnosis not present

## 2019-06-19 DIAGNOSIS — R7989 Other specified abnormal findings of blood chemistry: Secondary | ICD-10-CM | POA: Diagnosis not present

## 2019-06-19 LAB — CBC WITH DIFFERENTIAL/PLATELET
Abs Immature Granulocytes: 0.01 10*3/uL (ref 0.00–0.07)
Basophils Absolute: 0 10*3/uL (ref 0.0–0.1)
Basophils Relative: 0 %
Eosinophils Absolute: 0.1 10*3/uL (ref 0.0–0.5)
Eosinophils Relative: 2 %
HCT: 33.8 % — ABNORMAL LOW (ref 36.0–46.0)
Hemoglobin: 10.4 g/dL — ABNORMAL LOW (ref 12.0–15.0)
Immature Granulocytes: 0 %
Lymphocytes Relative: 28 %
Lymphs Abs: 1.4 10*3/uL (ref 0.7–4.0)
MCH: 26.5 pg (ref 26.0–34.0)
MCHC: 30.8 g/dL (ref 30.0–36.0)
MCV: 86.2 fL (ref 80.0–100.0)
Monocytes Absolute: 0.5 10*3/uL (ref 0.1–1.0)
Monocytes Relative: 9 %
Neutro Abs: 3.1 10*3/uL (ref 1.7–7.7)
Neutrophils Relative %: 61 %
Platelets: 280 10*3/uL (ref 150–400)
RBC: 3.92 MIL/uL (ref 3.87–5.11)
RDW: 20.9 % — ABNORMAL HIGH (ref 11.5–15.5)
WBC: 5.1 10*3/uL (ref 4.0–10.5)
nRBC: 0 % (ref 0.0–0.2)

## 2019-06-19 LAB — C-REACTIVE PROTEIN: CRP: 1.7 mg/dL — ABNORMAL HIGH (ref <1.0)

## 2019-06-19 LAB — ECHOCARDIOGRAM LIMITED
Height: 67.008 in
Weight: 1992.96 oz

## 2019-06-19 LAB — SEDIMENTATION RATE: Sed Rate: 20 mm/hr (ref 0–22)

## 2019-06-19 LAB — COMPREHENSIVE METABOLIC PANEL
ALT: 24 U/L (ref 0–44)
AST: 23 U/L (ref 15–41)
Albumin: 3.3 g/dL — ABNORMAL LOW (ref 3.5–5.0)
Alkaline Phosphatase: 125 U/L (ref 38–126)
Anion gap: 12 (ref 5–15)
BUN: 11 mg/dL (ref 6–20)
CO2: 22 mmol/L (ref 22–32)
Calcium: 9.1 mg/dL (ref 8.9–10.3)
Chloride: 104 mmol/L (ref 98–111)
Creatinine, Ser: 0.57 mg/dL (ref 0.44–1.00)
GFR calc Af Amer: >60 mL/min (ref >60)
GFR calc non Af Amer: >60 mL/min (ref >60)
Glucose, Bld: 103 mg/dL — ABNORMAL HIGH (ref 70–99)
Potassium: 4.1 mmol/L (ref 3.5–5.1)
Sodium: 138 mmol/L (ref 135–145)
Total Bilirubin: 0.6 mg/dL (ref 0.3–1.2)
Total Protein: 7.2 g/dL (ref 6.5–8.1)

## 2019-06-19 LAB — TSH: TSH: 2.774 u[IU]/mL (ref 0.350–4.500)

## 2019-06-19 LAB — PHOSPHORUS: Phosphorus: 4.9 mg/dL — ABNORMAL HIGH (ref 2.5–4.6)

## 2019-06-19 LAB — VANCOMYCIN, TROUGH: Vancomycin Tr: 9 ug/mL — ABNORMAL LOW (ref 15–20)

## 2019-06-19 LAB — VANCOMYCIN, PEAK: Vancomycin Pk: 23 ug/mL — ABNORMAL LOW (ref 30–40)

## 2019-06-19 LAB — MAGNESIUM: Magnesium: 1.6 mg/dL — ABNORMAL LOW (ref 1.7–2.4)

## 2019-06-19 MED ORDER — VANCOMYCIN HCL 750 MG/150ML IV SOLN
750.0000 mg | Freq: Two times a day (BID) | INTRAVENOUS | Status: DC
  Filled 2019-06-19: qty 150

## 2019-06-19 MED ORDER — VANCOMYCIN HCL IN DEXTROSE 1-5 GM/200ML-% IV SOLN
1000.0000 mg | Freq: Two times a day (BID) | INTRAVENOUS | Status: DC
  Filled 2019-06-19: qty 200

## 2019-06-19 MED ORDER — VANCOMYCIN HCL 750 MG/150ML IV SOLN
750.0000 mg | Freq: Two times a day (BID) | INTRAVENOUS | Status: DC
  Administered 2019-06-19 – 2019-06-29 (×21): 750 mg via INTRAVENOUS
  Filled 2019-06-19 (×24): qty 150

## 2019-06-19 NOTE — Progress Notes (Signed)
Clinically and hemodynamically stable. Not in florid heart failure. Echo is unchanged compared to 4 weeks prior, still has severe TR. I do not see any prohibitive risk for general anesthesia for dental extraction. Recommend discuss with long term plans for valve replacement with CT surgery.  Elder Negus, MD [facility] Pager: [phone] Office: [phone]

## 2019-06-19 NOTE — Progress Notes (Addendum)
Occupational Therapy Treatment Patient Details Name: Christy Pearson MRN: [id number] DOB: December 25, 1990 Today's Date: 06/19/2019    History of present illness Pt is a 29 y/o female admitted with fevere chills and back pain. 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 PMH hepatitis C, Kidney stones, IV drug abuse,    OT comments  Returned to provide HEP for shoulder ROM.  Reinforced importance of achieving increased AROM, working on strengthening within available AROM (to 90 FF); pt voiced understanding.  Patient completing wall slides, scaption slides, theraband forward press and FF to 90* with cueing to avoid compensation by hiking shoulders.  Encouraged patient to complete exercises daily, building up to 10 reps 3x/day. HEP access code: [email or web address]. Will follow acutely.    Follow Up Recommendations  Outpatient OT;Supervision - Intermittent    Equipment Recommendations  None recommended by OT    Recommendations for Other Services      Precautions / Restrictions Precautions Precautions: None Restrictions Weight Bearing Restrictions: Yes RUE Weight Bearing: Weight bearing as tolerated LUE Weight Bearing: Weight bearing as tolerated       Mobility Bed Mobility               General bed mobility comments: OOB upon entry   Transfers Overall transfer level: Independent                  Balance Overall balance assessment: No apparent balance deficits (not formally assessed)                                         ADL either performed or assessed with clinical judgement   ADL Overall ADL's : Modified independent                                           Vision       Perception     Praxis      Cognition Arousal/Alertness: Awake/alert Behavior During Therapy: WFL for tasks  assessed/performed Overall Cognitive Status: Within Functional Limits for tasks assessed                                          Exercises Exercises: Other exercises Other Exercises Other Exercises: Pt provided HEP from [employer], including wall scaption slide, FF slides, forward press with theraband, shoulder flexion to 90 with theraband, and wall pushups.  Other Exercises: Reviewed exercises: forward press and elbow flexion with theraband; as well as wall pushups for proximal strengthening    Shoulder Instructions       General Comments returned to provide HEP and review with pt    Pertinent Vitals/ Pain       Pain Assessment: Faces Faces Pain Scale: No hurt Pain Location: B shoulders  Pain Descriptors / Indicators: Tightness Pain Intervention(s): Monitored during session;Repositioned;Limited activity within patient's tolerance  Home Living Family/patient expects to be discharged to:: Private residence Living Arrangements: Parent Available Help at Discharge: Family Type of Home: House Home Access: Stairs to enter Technical brewer of Steps: 3   Home Layout: One level     Bathroom Shower/Tub: Tub/shower  unit;Walk-in shower   Bathroom Toilet: Standard     Home Equipment: None          Prior Functioning/Environment Level of Independence: Independent            Frequency  Min 3X/week        Progress Toward Goals  OT Goals(current goals can now be found in the care plan section)  Progress towards OT goals: Progressing toward goals  Acute Rehab OT Goals Patient Stated Goal: to improve arm movement and strength OT Goal Formulation: With patient Time For Goal Achievement: 07/03/19 Potential to Achieve Goals: Good ADL Goals Additional ADL Goal #1: Patient will demonstrate ability to retrieve items from overhead cabinents with independence.  Plan Discharge plan remains appropriate;Frequency remains appropriate     Co-evaluation                 AM-PAC OT "6 Clicks" Daily Activity     Outcome Measure   Help from another person eating meals?: None Help from another person taking care of personal grooming?: None Help from another person toileting, which includes using toliet, bedpan, or urinal?: None Help from another person bathing (including washing, rinsing, drying)?: None Help from another person to put on and taking off regular upper body clothing?: None Help from another person to put on and taking off regular lower body clothing?: None 6 Click Score: 24    End of Session    OT Visit Diagnosis: Muscle weakness (generalized) (M62.81) Pain - Right/Left: (bil) Pain - part of body: Shoulder   Activity Tolerance Patient tolerated treatment well   Patient Left Other (comment)(seated EOB )   Nurse Communication          Time: 4709-6283 OT Time Calculation (min): 13 min  Charges: OT General Charges $OT Visit: 1 Visit OT Treatments $Therapeutic Exercise: 8-22 mins  Barry Brunner, OT Acute Rehabilitation Services Pager [phone] Office [phone]     Chancy Milroy 06/19/2019, 2:52 PM

## 2019-06-19 NOTE — Evaluation (Addendum)
Occupational Therapy Evaluation Patient Details Name: Christy Pearson MRN: [id number] DOB: [birth year] Today's Date: 06/19/2019    History of Present Illness Pt is a 29 y/o female admitted with fevere chills and back pain. 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 PMH hepatitis C, Kidney stones, IV drug abuse,    Clinical Impression   PTA patient independent.  OT recently signed off and provided patient with exercises to complete for shoulder ROM.  Patient currently independent with ADLs, managing IV pole around room and completing mobility with independence, but limited by decreased ROM in B shoulders.  She is able to achieve approx 90* FF bilaterally actively, [facility] with PROM and no pain.  Educated patient on exercises to complete standing that provide active assisted range to promote strengthening (see below for details).  Plan to return with handout.  Recommend follow up with OP OT services.     Follow Up Recommendations  Outpatient OT;Supervision - Intermittent    Equipment Recommendations  None recommended by OT    Recommendations for Other Services       Precautions / Restrictions Precautions Precautions: None Restrictions Weight Bearing Restrictions: Yes RUE Weight Bearing: Weight bearing as tolerated LUE Weight Bearing: Weight bearing as tolerated      Mobility Bed Mobility               General bed mobility comments: OOB upon entry   Transfers Overall transfer level: Independent               General transfer comment: no physical assist required, demonstrates adequate safety managing IV pole    Balance Overall balance assessment: No apparent balance deficits (not formally assessed)                                         ADL either performed or assessed with clinical judgement    ADL Overall ADL's : Modified independent                                       General ADL Comments: pt at modified independnet level for ADLs, focus on shoudler ROM      Vision         Perception     Praxis      Pertinent Vitals/Pain Pain Assessment: Faces Faces Pain Scale: Hurts a little bit Pain Location: B shoulders  Pain Descriptors / Indicators: Tightness Pain Intervention(s): Monitored during session;Repositioned;Limited activity within patient's tolerance     Hand Dominance     Extremity/Trunk Assessment Upper Extremity Assessment Upper Extremity Assessment: RUE deficits/detail;LUE deficits/detail RUE Deficits / Details: AROM shoulder to FF 90/ab 45, PROM WFL; E/W/H WFL  LUE Deficits / Details: AROM shoulder to FF 90/ab 45, PROM WFL; E/W/H WFL        Cervical / Trunk Assessment Cervical / Trunk Assessment: Normal   Communication Communication Communication: No difficulties   Cognition Arousal/Alertness: Awake/alert Behavior During Therapy: WFL for tasks assessed/performed Overall Cognitive Status: Within Functional Limits for tasks assessed  General Comments  educated patient on exercises to increase strength and increased shoulder AROM, pt reports no pain with movements; reviewed importance of not pushing past pain and slowly increasing ROM/strength     Exercises Exercises: Other exercises Other Exercises Other Exercises: Patient completed HOB rail angled and wall slides AAROM x 10 reps each UE (each exercises) with cueing to avoid compensation (shoulder elevation) Other Exercises: Reviewed exercises: forward press and elbow flexion with theraband; as well as wall pushups for proximal strengthening    Shoulder Instructions      Home Living Family/patient expects to be discharged to:: Private residence Living Arrangements: Parent Available Help at Discharge: Family Type of Home:  House Home Access: Stairs to enter Technical brewer of Steps: 3   Home Layout: One level     Bathroom Shower/Tub: Tub/shower unit;Walk-in shower   Bathroom Toilet: Standard     Home Equipment: None          Prior Functioning/Environment Level of Independence: Independent                 OT Problem List: Decreased strength;Decreased range of motion;Impaired UE functional use;Pain      OT Treatment/Interventions: Therapeutic exercise;Self-care/ADL training;Patient/family education;Therapeutic activities    OT Goals(Current goals can be found in the care plan section) Acute Rehab OT Goals Patient Stated Goal: to improve arm movement and strength OT Goal Formulation: With patient Time For Goal Achievement: 07/03/19 Potential to Achieve Goals: Good  OT Frequency: Min 3X/week   Barriers to D/C:            Co-evaluation              AM-PAC OT "6 Clicks" Daily Activity     Outcome Measure Help from another person eating meals?: None Help from another person taking care of personal grooming?: None Help from another person toileting, which includes using toliet, bedpan, or urinal?: None Help from another person bathing (including washing, rinsing, drying)?: None Help from another person to put on and taking off regular upper body clothing?: None Help from another person to put on and taking off regular lower body clothing?: None 6 Click Score: 24   End of Session    Activity Tolerance: Patient tolerated treatment well Patient left: Other (comment)(walking hallway independently)  OT Visit Diagnosis: Muscle weakness (generalized) (M62.81) Pain - Right/Left: (bil) Pain - part of body: Shoulder                Time: 0962-8366 OT Time Calculation (min): 30 min Charges:  OT General Charges $OT Visit: 1 Visit OT Evaluation $OT Eval Low Complexity: 1 Low  OT Treatments $Therapeutic Exercise: 8-22 mins  Jolaine Artist, OT Acute Rehabilitation  Services Pager [phone] Office [phone]    [name] 06/19/2019, 1:10 PM

## 2019-06-19 NOTE — Progress Notes (Signed)
  Echocardiogram 2D Echocardiogram has been performed.  Christy Pearson 06/19/2019, 11:59 AM

## 2019-06-19 NOTE — Progress Notes (Signed)
Subjective: 25 Days Post-Op s/p Procedure(s): ARTHROSCOPY SHOULDER   Patient is alert, oriented, sitting up in bed. Patient reports pain in shoulders to be moderate and getting worse than earlier last week. States that pain is worse with movement but thinks pain is more due to how she is sleeping on them.   Objective:  PE: VITALS:   Vitals:   06/18/19 0631 06/18/19 1430 06/18/19 2118 06/19/19 0652  BP: 108/80 104/74 115/72 110/81  Pulse: 98 97 (!) 102 92  Resp: 18 18 18 18   Temp: 98.7 F (37.1 C) 98.4 F (36.9 C) 99.2 F (37.3 C) 98.8 F (37.1 C)  TempSrc: Oral Oral Oral Oral  SpO2: 100% 100% 100% 100%  Weight:      Height:       General: Alert, oriented, in no acute distress Resp: No use of accessory musculature MSK: No TTP to bilateral shoulders. Patient about to perform forward flexion 90 degrees bilaterally. Distal sensation and capillary refill intact. No Erythema or edema noted on either shoulder. No erythema or edema around arthroscopic incision sites, incision sites fully healed.   LABS  Results for orders placed or performed during the hospital encounter of 05/18/19 (from the past 24 hour(s))  hCG, quantitative, pregnancy     Status: None   Collection Time: 06/18/19  3:48 PM  Result Value Ref Range   hCG, Beta Chain, Quant, S 1 <5 mIU/mL  Pregnancy, urine     Status: None   Collection Time: 06/18/19  4:07 PM  Result Value Ref Range   Preg Test, Ur NEGATIVE NEGATIVE  CBC with Differential/Platelet     Status: Abnormal   Collection Time: 06/19/19  1:28 PM  Result Value Ref Range   WBC 5.1 4.0 - 10.5 K/uL   RBC 3.92 3.87 - 5.11 MIL/uL   Hemoglobin 10.4 (L) 12.0 - 15.0 g/dL   HCT 08/19/19 (L) 32.9 - 51.8 %   MCV 86.2 80.0 - 100.0 fL   MCH 26.5 26.0 - 34.0 pg   MCHC 30.8 30.0 - 36.0 g/dL   RDW 84.1 (H) 66.0 - 63.0 %   Platelets 280 150 - 400 K/uL   nRBC 0.0 0.0 - 0.2 %   Neutrophils Relative % 61 %   Neutro Abs 3.1 1.7 - 7.7 K/uL   Lymphocytes Relative 28  %   Lymphs Abs 1.4 0.7 - 4.0 K/uL   Monocytes Relative 9 %   Monocytes Absolute 0.5 0.1 - 1.0 K/uL   Eosinophils Relative 2 %   Eosinophils Absolute 0.1 0.0 - 0.5 K/uL   Basophils Relative 0 %   Basophils Absolute 0.0 0.0 - 0.1 K/uL   Immature Granulocytes 0 %   Abs Immature Granulocytes 0.01 0.00 - 0.07 K/uL    DG Orthopantogram  Result Date: 06/17/2019 CLINICAL DATA:  29 year old female with pain and swelling in the RIGHT jaw. EXAM: ORTHOPANTOGRAM/PANORAMIC COMPARISON:  None. FINDINGS: Multiple bilateral dental caries are noted. Periapical lucency along teeth 18, 19, 20, 30 and 31 are noted compatible with periapical abscesses. IMPRESSION: Multiple bilateral dental caries as well as periapical abscesses of teeth 18, 19, 20, 30 and 31. Electronically Signed   By: 30 M.D.   On: 06/17/2019 16:09   ECHOCARDIOGRAM LIMITED  Result Date: 06/19/2019    ECHOCARDIOGRAM LIMITED REPORT   Patient Name:   Christy Pearson Date of Exam: 06/19/2019 Medical Rec #:  08/19/2019       Height:  67.0 in Accession #:    [id number]      Weight:       124.6 lb Date of Birth:  17-Jun-1990       BSA:          1.654 m Patient Age:    28 years        BP:           110/81 mmHg Patient Gender: F               HR:           108 bpm. Exam Location:  Inpatient Procedure: Limited Echo, Limited Color Doppler and Cardiac Doppler Indications:     Endocarditis  History:         Patient has prior history of Echocardiogram examinations, most                  recent 05/23/2019. 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  Sonographer:     Mikki Santee RDCS (AE) Referring Phys:  [id number] [place] PATWARDHAN Diagnosing Phys: Vernell Leep MD IMPRESSIONS  1. Left ventricular ejection fraction by PLAX is 70 %. Left ventricular endocardial border not optimally defined to evaluate regional wall motion. Left ventricular diastolic function could not be evaluated.  2. Right ventricular systolic function is normal. The right ventricular size is  normal. There is mildly elevated pulmonary artery systolic pressure.  3. Right atrial size was severely dilated.  4. 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  5. No significant change compared to TEE on 05/23/2019. FINDINGS  Left Ventricle: Left ventricular ejection fraction by PLAX is 70 % Left ventricular endocardial border not optimally defined to evaluate regional wall motion. Right Ventricle: The right ventricular size is normal. Right ventricular systolic function is normal. There is mildly elevated pulmonary artery systolic pressure. The tricuspid regurgitant velocity is 2.74 m/s, and with an assumed right atrial pressure of 8 mmHg, the estimated right ventricular systolic pressure is 10.6 mmHg. Right Atrium: Right atrial size was severely dilated. Pericardium: Trivial pericardial effusion is present. The pericardial effusion is circumferential. Mitral Valve: The mitral valve is grossly normal. Tricuspid Valve: Vegetations on septal and posterior leaflets (1X0.5 cm). The tricuspid valve is abnormal. Tricuspid valve regurgitation is severe. Aortic Valve: The aortic valve is grossly normal. Aortic valve regurgitation is not visualized. Aorta: The aortic root is normal in size and structure. Venous: The inferior vena cava is normal in size with less than 50% respiratory variability, suggesting right atrial pressure of 8 mmHg. The inferior vena cava and the hepatic vein show a pattern of systolic flow reversal, suggestive of tricuspid regurgitation. IAS/Shunts: There is redundancy of the interatrial septum. No atrial level shunt detected by color flow Doppler.  LEFT VENTRICLE PLAX 2D LV EF:         Left ventricular ejection fraction by PLAX is 70 % LVIDd:         4.60 cm LVIDs:         2.80 cm LV PW:         1.10 cm LV IVS:        0.70 cm  RIGHT VENTRICLE          IVC RV Basal diam:  3.80 cm  IVC diam: 2.00 cm RV Mid diam:    4.60 cm LEFT ATRIUM           Index       RIGHT ATRIUM  Index LA diam:      3.00 cm 1.81 cm/m  RA Area:     21.40 cm LA Vol (A4C): 23.1 ml 13.97 ml/m RA Volume:   62.00 ml  37.49 ml/m   AORTA Ao Root diam: 2.70 cm TRICUSPID VALVE TR Peak grad:   30.0 mmHg TR Vmax:        274.00 cm/s Manish Patwardhan MD Electronically signed by Truett Mainland MD Signature Date/Time: 06/19/2019/1:04:18 PM    Final     Assessment/Plan: Principal Problem:   MRSA bacteremia Active Problems:   Injection of illicit drug within last 12 months   Hepatitis C antibody positive, s/p spontaneously cleared infection   Sepsis (HCC)   Septic pulmonary embolism (HCC)   Substance use disorder   Suspected endocarditis   IV drug abuse (HCC)   Septic embolism (HCC)   Staphylococcal arthritis of right shoulder (HCC)   Staphylococcal arthritis of left wrist (HCC)   PFO (patent foramen ovale)   Endocarditis of tricuspid valve   Septic arthritis of shoulder, left (HCC)   Septic arthritis of shoulder, right (HCC)   Elevated LFTs   25 Days Post-Op s/p Procedure(s): ARTHROSCOPY SHOULDER  Weightbearing:WBAT bilateral upper extremities. Patient to continue to work on ROM of bilateral shoulders. Insicional and dressing care:Does not need shoulder dressings. Showering:Ok to shower VTE prophylaxis:Lovenox daily Follow - up plan: Patient to follow-up with Dr. Dion Saucier 2 weeks after discharge.  Sed rate and CRP ordered due to new shoulder pain, awaiting results.   Plannedcontinued stay in hospital until March 12due to needforinpatient antibiotic treatment. Will continue to follow her progress intermittently during her hospital stay.  Contact information:   Weekdays 8-5 Christy Pearson, [place] [phone] A fter hours and holidays please check [email or web address] for group call information for [facility]  Armida Sans 06/19/2019, 2:08 PM

## 2019-06-19 NOTE — Progress Notes (Signed)
Lab here to draw Vanc peak.

## 2019-06-19 NOTE — Progress Notes (Signed)
Pharmacy Antibiotic Note  Christy Pearson is a 29 y.o. female admitted on 05/18/2019 with with Metastatic MRSA bacteremia and TV endocarditis with PFO, BL cavitary septic emboli, BL septic shoulders, possibly septic wrist and L shoulder osteo.discitis and osteomyelitis .  Pharmacy has been consulted for Vancomycin dosing.   Vanc levels today:      Peak = 23 mcg/ml (~2.5 hrs after dose); true peak ~31.6 mcg/ml    Trough = 9 mcg/ml    AUC 417 - within target range off 400-550  Plan: Continue Vancomycin 750 mg IV q12h Planning 6 weeks of IV antibiotics, thru 07/09/19 Bmet q72hrs Vanc levels weekly.  Height: 5' 7.01" (170.2 cm) Weight: 124 lb 9 oz (56.5 kg) IBW/kg (Calculated) : 61.62  Temp (24hrs), Avg:98.5 F (36.9 C), Min:98.3 F (36.8 C), Max:98.8 F (37.1 C)  Recent Labs  Lab 06/12/19 2212 06/13/19 0457 06/15/19 0742 06/19/19 1328 06/19/19 2058  WBC  --   --  5.2 5.1  --   CREATININE  --  0.64 0.61 0.57  --   VANCOTROUGH  --   --   --   --  9*  VANCOPEAK 27*  --   --  23*  --     Estimated Creatinine Clearance: 95 mL/min (by C-G formula based on SCr of 0.57 mg/dL).    Allergies  Allergen Reactions  . Tylenol [Acetaminophen] Rash    Pt just reported tylenol allergy at this visit at 2338pm    Antimicrobials this admission: Vancomycin 1/29 >>(3/12) Ceftriaxone 1/30>>2/1 for UTI Cefepime 1/29 x3 doses -2/4: Amoxicillin X 1 to document tolerance (Patient has taken in the past with no issues)  Dose adjustments this admission: 2/2: VP 29, VT 9, AUC 442 - continue  2/8: VP 37, VT 16, AUC 638 - dec to 750mg  q12h (expected AUC 478). 2/12 VP = 23 (dose given later than usual d/t IV access issue), VT 8 > AUC 349 - on 750 mg IV q12h > incr 1gm q12 2/18 VP = 36, VT = 14, AUC 651>dec 750q12 2/22  VP 27, VT 10,  AUC 404 - continue 750mg  q12h 2/28: pt refused AM vanc dose. Allowed dose to be given late. Future doses retimed and vanc levels ordered for tomorrow 3/1: VP 23 (2.5  hrs p/ dose): VT 9, AUC 417 - cont 750 q12h  Microbiology results: 1/28 Urine: E coli 1/28 Blood x 2: MRSA 1/29 Blood x 3: negative 1/29 resp panel - negative 2/4 R shoulder fluid  - MRSA 2/4 L shoulder fluid - negative   Thank you for allowing pharmacy to be a part of this patient's care.  2/29, 2/29 Phone: Dennie Fetters 06/19/2019 10:04 PM

## 2019-06-19 NOTE — Progress Notes (Signed)
Verified with Clinical Pharmacist that OK to do peak prior to trough today, OK'd.

## 2019-06-19 NOTE — Progress Notes (Signed)
PROGRESS NOTE    Christy Pearson  NOB:[id number] DOB: Feb 06, 1991 DOA: 05/18/2019 PCP: Patient, No Pcp Per  Brief Narrative:  The patient is a 29 year old Caucasian female with a past medical history significant for but not limited to hepatitis C with spontaneous clearance, polysubstance abuse with heroin and crystal meth along with multiple complications who was admitted on 05/18/2019 for chief complaint of right flank pain.  She is worked up and currently being treated for septic emboli, bacteremia, tricuspid valve endocarditis, bilateral shoulder and spine infections.  She is admitted to the hospital service after CT scan noted multiple pulmonary cavitary lesions felt to be from septic emboli and ensuing work-up revealed positive blood cultures for MRSA and a TEE noted tricuspid valve endocarditis and a suspected PFO as well as bilateral shoulder and spine infection.  She underwent arthrocentesis and debridement done by orthopedic surgery on 05/25/2019 and she is also seen by infectious diseases who recommended 6 weeks of IV antibiotics which she will complete in house on 06/30/2019.  She is to remain in the hospital complete the course of IV vancomycin for MRSA bacteremia given that she is an IV drug user requires supervision.  Her hospital course has also been notable for a blood transfusion and IV iron secondary to anemia of chronic disease.  She also had a UTI which was treated and a PICC line was placed for long-term IV antibiotics on 06/04/2019.  She currently complains of some chronic leg cramps and complains of difficulty sleeping.  **Interim History  She was placed on methadone and during the course of her hospitalization has been increased to 20 mg and during the course of her hospitalization has been increased to 20 mg and we will not increase further and will attempt to wean back.  She complains of some bilateral shoulder pain and left wrist pain and will obtain OT evaluation again.  PT  recommends follow-up.  Hospitalization has been complicated by some constipation and some dental pain.  Now she also is requesting a nicotine patch given that she is constantly eating.   Assessment & Plan:   Principal Problem:   MRSA bacteremia Active Problems:   Injection of illicit drug within last 12 months   Hepatitis C antibody positive, s/p spontaneously cleared infection   Sepsis (HCC)   Septic pulmonary embolism (HCC)   Substance use disorder   Suspected endocarditis   IV drug abuse (HCC)   Septic embolism (HCC)   Staphylococcal arthritis of right shoulder (HCC)   Staphylococcal arthritis of left wrist (HCC)   PFO (patent foramen ovale)   Endocarditis of tricuspid valve   Septic arthritis of shoulder, left (HCC)   Septic arthritis of shoulder, right (HCC)   Elevated LFTs  MRSA bacteremiasecondary to IV drug use, severe sepsis, tricuspid valve endocarditis, multilevel vertebral osteomyelitis, septic pulmonary emboli, bilateral septic shoulder arthritis and suspected left wrist septic arthrits. -Stabilized with fluids and antibiotics.  -Status post R shoulder arthrocentesis and debridement by orthopedic surgery 2/4. -Complained of Left Wrist Pain to nurse today -TCTS evaluated and Dr. Cliffton Asters wrote "Hx of IV drug use now with evidence of MRSA tricuspid valve endocarditis, severe TR, PFO, and septic arthritis. Most recent use was 1 day prior to admission. Most recent blood cultures are negative. The vegetation is small, and likely affecting the anterior and posterior leaftlets. Unfortunately due to the PFO, she is not a candidate for Angiovac debridement. Due to the septic arthritis, she would not be a candidate for surgical valve replacement.  If we are able to clear extra-cardiac sources of infection, and obtain some degree of drug rehabilitation, we would discuss the option of surgical repair of her valve." -Will get PT/OT to evaluate and treat again; PT recommends no  follow-up and OT to still reevaluate -Continue IV vancomycin per infectious disease, total 6 weeks to complete March 12.  Continue close monitoring of her renal function as she is on vancomycin along with vancomycin peaks and troughs -Not a candidate for outpatient treatment given her IVDA -Cardiology will be obtaining an echocardiogram for preoperative clearance for dental extractions  IV drug use within last 12 months.  -Patient had requested to increase dose from 12.5 mg daily to 15 mg daily and now to 20 mg Daily and this was done by Dr. Nevada Crane as the patient told Dr. Nevada Crane that low-dose methadone is causing her to have excessive sweats.  We will not escalate any further and have judicious use of narcotics and attempt to wean back her methadone if able and she previously requested a slow wean from methadone and will attempt to have milligrams every 3 days if able -Currently has pain control with gabapentin 300 mg p.o. twice daily, ibuprofen 400 mg p.o. every 6 as needed for moderate pain, along with her methadone -States that in her close environment friends and family are IVDAs. -We will need to monitor her extremely carefully  Suspected S1 nerve compressionsecondary to vertebral osteomyelitis.  -No neurologic deficit previously noted.  -Seen by neurosurgery with no intervention recommended. -Continue to monitor carefully  Anemia of Chronic Disease. -Continue with ferrous sulfate 325 mg p.o. twice daily -Status post 1 unit PRBC as well as IV iron. -Last Hemoglobin/Hematocrit 10.2/33.5 -Continue to Monitor for S/Sx of Bleeding; Currently no overt Bleeding Noted -Repeat CBC intermittently and not done this morning yet  Insomnia/sleep disturbance -Switched back to Trazodone 50 mg p.o. nightly as needed  Abnormal LFTs -Transaminitis and LFTs have improved is normalized -Initially felt secondary to sepsis and possible shock liver -Liver ultrasound was benign -On last LFT check showed  AST of 24 and ALT of 21 -Wanted to repeat her CMP is morning still pending  E. Coli UTI -She was treated with IV Rocephin course -Continue to monitor  Leg Cramps -Continue with Gabapentin 300 mg po BID  Hyperphosphatemia -Mild as Phos Level was 4.8 on 06/15/2019 -Continue to Monitor closely and was wanted to repeat this morning but not done yet  Constipation -Start a bowel regimen with senna docusate 1 tab p.o. twice daily, MiraLAX 17 g p.o. twice daily, bisacodyl 10 mg rectal suppository daily as needed -If not improving will give the patient an enema  Dental Pain in the setting of her multiple dental caries as well as periapical abscesses -Ordered an Orthopantogram to evaluate and showed "Multiple bilateral dental caries as well as periapical abscesses of teeth 18, 19, 20, 30 and 31." -Have Consulted Dr. Jorja Loa of Dentistry to further evaluate and appreciate his Recc's -Dr. Enrique Sack is reviewed her orthopantogram and feels that the patient needs multiple extractions and requested preoperative clearance to see if she was safe for general anesthesia -I consulted Dr. Randa Lynn for further cardiac clearance and he recommends obtaining an echocardiogram which has been ordered and pending  Tobacco abuse -Patient states that she smokes 1 pack/day -We will start nicotine patch 14 mg transdermal daily -Smoking cessation counseling given  Missed Period -Check TSH, Prolactin, and Pregnancy Test -? Related to illness as above -Urine Pregnancy Test Negative and  Beta HCG was 1 -To monitor carefully  DVT prophylaxis: Enoxaparin 40 mg sq q24h Code Status: FULL CODE  Family Communication: No family present at bedside  Disposition Plan: Patient is from home and she is high risk to be discharged with IV antibiotics which she will remain hospitalized until completion of her antibiotic course on 06/30/2019  Consultants:   Cardiology  Orthopedic Surgery  Cardiothoracic  surgery  Infectious Diseases  Dentistry   Procedures:   Transfusion 1 unit packed red blood cells  2D echocardiogram done 1/29: Moderate to severe tricuspid regurg with suspicions for tricuspid endocarditis although vegetation not seen.  TEE done 1/29: Severe tricuspid valve regurg with moderately sized mobile tricuspid valve vegetation on 2 leaflets. Preserved ejection fraction  Arthroscopy of her shoulder  PICC line placed 2/14  Repeat Echocardiogram ordered by Cardiology   Antimicrobials:  Anti-infectives (From admission, onward)   Start     Dose/Rate Route Frequency Ordered Stop   06/18/19 2200  vancomycin (VANCOREADY) IVPB 750 mg/150 mL  Status:  Discontinued     750 mg 150 mL/hr over 60 Minutes Intravenous Every 12 hours 06/18/19 1221 06/18/19 1225   06/18/19 2100  vancomycin (VANCOREADY) IVPB 750 mg/150 mL     750 mg 150 mL/hr over 60 Minutes Intravenous Every 12 hours 06/18/19 1225     06/09/19 0800  vancomycin (VANCOREADY) IVPB 750 mg/150 mL  Status:  Discontinued     750 mg 150 mL/hr over 60 Minutes Intravenous Every 12 hours 06/08/19 1840 06/18/19 1221   06/03/19 1800  vancomycin (VANCOCIN) IVPB 1000 mg/200 mL premix  Status:  Discontinued     1,000 mg 200 mL/hr over 60 Minutes Intravenous Every 12 hours 06/03/19 0456 06/08/19 1841   05/31/19 0200  vancomycin (VANCOREADY) IVPB 750 mg/150 mL  Status:  Discontinued     750 mg 150 mL/hr over 60 Minutes Intravenous Every 12 hours 05/31/19 0126 06/03/19 0456   05/30/19 2200  vancomycin (VANCOREADY) IVPB 750 mg/150 mL  Status:  Discontinued     750 mg 150 mL/hr over 60 Minutes Intravenous Every 12 hours 05/30/19 1207 05/30/19 2126   05/25/19 1230  amoxicillin (AMOXIL) capsule 500 mg     500 mg Oral  Once 05/25/19 1120 05/25/19 1435   05/21/19 1000  vancomycin (VANCOCIN) IVPB 1000 mg/200 mL premix  Status:  Discontinued     1,000 mg 200 mL/hr over 60 Minutes Intravenous Every 12 hours 05/21/19 0948 05/30/19 1207    05/20/19 1400  cefTRIAXone (ROCEPHIN) 2 g in sodium chloride 0.9 % 100 mL IVPB  Status:  Discontinued     2 g 200 mL/hr over 30 Minutes Intravenous Every 24 hours 05/20/19 1328 05/22/19 0947   05/19/19 2200  levofloxacin (LEVAQUIN) IVPB 750 mg  Status:  Discontinued     750 mg 100 mL/hr over 90 Minutes Intravenous Every 24 hours 05/19/19 0212 05/19/19 0213   05/19/19 2200  levofloxacin (LEVAQUIN) IVPB 500 mg  Status:  Discontinued     500 mg 100 mL/hr over 60 Minutes Intravenous Every 24 hours 05/19/19 0213 05/19/19 0539   05/19/19 1000  vancomycin (VANCOREADY) IVPB 750 mg/150 mL  Status:  Discontinued     750 mg 150 mL/hr over 60 Minutes Intravenous Every 12 hours 05/19/19 0539 05/21/19 0948   05/19/19 0600  ceFEPIme (MAXIPIME) 2 g in sodium chloride 0.9 % 100 mL IVPB  Status:  Discontinued     2 g 200 mL/hr over 30 Minutes Intravenous Every 8 hours 05/19/19  7824 05/19/19 2348   05/19/19 0130  vancomycin (VANCOCIN) IVPB 1000 mg/200 mL premix     1,000 mg 200 mL/hr over 60 Minutes Intravenous  Once 05/19/19 0122 05/19/19 0253   05/18/19 2345  levofloxacin (LEVAQUIN) IVPB 750 mg     750 mg 100 mL/hr over 90 Minutes Intravenous  Once 05/18/19 2338 05/19/19 0140     Subjective: Seen and examined at bedside she is still stating that her shoulder is sore.  States that her teeth are sore as well and hurting.  No chest pain, lightheadedness or dizziness.  No nausea or vomiting.  Happy that she is not pregnant.  No other concerns or complaints at this time.  Objective: Vitals:   06/18/19 0631 06/18/19 1430 06/18/19 2118 06/19/19 0652  BP: 108/80 104/74 115/72 110/81  Pulse: 98 97 (!) 102 92  Resp: 18 18 18 18   Temp: 98.7 F (37.1 C) 98.4 F (36.9 C) 99.2 F (37.3 C) 98.8 F (37.1 C)  TempSrc: Oral Oral Oral Oral  SpO2: 100% 100% 100% 100%  Weight:      Height:        Intake/Output Summary (Last 24 hours) at 06/19/2019 0827 Last data filed at 06/19/2019 0500 Gross per 24 hour   Intake 950 ml  Output --  Net 950 ml   Filed Weights   05/19/19 0400 05/24/19 0836 05/25/19 1728  Weight: 56.5 kg 56.5 kg 56.5 kg   Examination: Physical Exam:  Constitutional: Patient is a thin Caucasian female currently no acute distress appears calm but is complaining of shoulder pain and does appear a little uncomfortable again Respiratory: Slightly diminished but unlabored breathing and has no appreciable wheezing, rales, rhonchi. Cardiovascular: Tachycardic rate slightly but is in normal sinus rhythm.  Has trace pedal edema Abdomen: Soft, nontender, nondistended.  Bowel sounds present Musculoskeletal: No contractures or cyanosis.  No joint deformities noted. Skin: Multiple skin tattoos diffusely scattered throughout her body on her feet arms and upper chest wall.  No appreciable rashes or lesions on a limited skin evaluation Psychiatric: Has a normal mood and affect and she is awake and alert and oriented   Data Reviewed: I have personally reviewed following labs and imaging studies  CBC: Recent Labs  Lab 06/15/19 0742  WBC 5.2  NEUTROABS 2.7  HGB 10.2*  HCT 33.5*  MCV 84.6  PLT 301   Basic Metabolic Panel: Recent Labs  Lab 06/13/19 0457 06/15/19 0742  NA 138 137  K 4.6 4.4  CL 102 102  CO2 24 25  GLUCOSE 92 86  BUN 15 14  CREATININE 0.64 0.61  CALCIUM 9.2 9.3  MG  --  1.9  PHOS  --  4.8*   GFR: Estimated Creatinine Clearance: 95 mL/min (by C-G formula based on SCr of 0.61 mg/dL). Liver Function Tests: Recent Labs  Lab 06/15/19 0742  AST 24  ALT 21  ALKPHOS 128*  BILITOT 0.5  PROT 7.3  ALBUMIN 3.0*   No results for input(s): LIPASE, AMYLASE in the last 168 hours. No results for input(s): AMMONIA in the last 168 hours. Coagulation Profile: No results for input(s): INR, PROTIME in the last 168 hours. Cardiac Enzymes: No results for input(s): CKTOTAL, CKMB, CKMBINDEX, TROPONINI in the last 168 hours. BNP (last 3 results) No results for  input(s): PROBNP in the last 8760 hours. HbA1C: No results for input(s): HGBA1C in the last 72 hours. CBG: No results for input(s): GLUCAP in the last 168 hours. Lipid Profile: No results for  input(s): CHOL, HDL, LDLCALC, TRIG, CHOLHDL, LDLDIRECT in the last 72 hours. Thyroid Function Tests: No results for input(s): TSH, T4TOTAL, FREET4, T3FREE, THYROIDAB in the last 72 hours. Anemia Panel: No results for input(s): VITAMINB12, FOLATE, FERRITIN, TIBC, IRON, RETICCTPCT in the last 72 hours. Sepsis Labs: No results for input(s): PROCALCITON, LATICACIDVEN in the last 168 hours.  No results found for this or any previous visit (from the past 240 hour(s)).   Radiology Studies: DG Orthopantogram  Result Date: 06/17/2019 CLINICAL DATA:  29 year old female with pain and swelling in the RIGHT jaw. EXAM: ORTHOPANTOGRAM/PANORAMIC COMPARISON:  None. FINDINGS: Multiple bilateral dental caries are noted. Periapical lucency along teeth 18, 19, 20, 30 and 31 are noted compatible with periapical abscesses. IMPRESSION: Multiple bilateral dental caries as well as periapical abscesses of teeth 18, 19, 20, 30 and 31. Electronically Signed   By: Harmon Pier M.D.   On: 06/17/2019 16:09    Scheduled Meds:  carvedilol  3.125 mg Oral BID WC   enoxaparin (LOVENOX) injection  40 mg Subcutaneous Q24H   ferrous sulfate  325 mg Oral BID   gabapentin  300 mg Oral BID   magnesium oxide  400 mg Oral BID   methadone  20 mg Oral Daily   nicotine  14 mg Transdermal Daily   polyethylene glycol  17 g Oral BID   prenatal multivitamin  1 tablet Oral Daily   senna-docusate  1 tablet Oral BID   Continuous Infusions:  lactated ringers 10 mL/hr at 05/24/19 0902   vancomycin Stopped (06/18/19 2311)    LOS: 31 days   Merlene Laughter, DO [facility] PAGER is on AMION  If 7PM-7AM, please contact night-coverage [email or web address]

## 2019-06-19 NOTE — Consult Note (Signed)
DENTAL CONSULTATION  Date of Consultation:  06/19/2019 Patient Name:   Christy Pearson Date of Birth:   [birth year] Medical Record Number: [id number]  COVID 19 SCREENING: The patient does not symptoms concerning for COVID-19 infection (Including fever, chills, cough, or new SHORTNESS OF BREATH).    VITALS: BP 110/81 (BP Location: Right Wrist)   Pulse 92   Temp 98.8 F (37.1 C) (Oral)   Resp 18   Ht 5' 7.01" (1.702 m)   Wt 56.5 kg   LMP 04/27/2019 (Within Days)   SpO2 100%   BMI 19.50 kg/m   CHIEF COMPLAINT: Referred by Dr. Marland Mcalpine for dental consultation.  HPI: Christy Pearson is a 29 year old female recently admitted with tricuspid valve endocarditis.  Patient is currently on IV vancomycin antibiotic therapy for 6 weeks.  Last dose is anticipated on 06/30/2019.  Dental consultation was requested to evaluate poor dentition as a possible source of infection for the patient.  The patient is currently complaining of tooth pain involving the lower left and lower right quadrants.  The lower left quadrant is intermittent in nature and has reached an intensity of 10 out of 10 but is currently 0 out of 10 with recent Advil administration.  Patient points to tooth #20 as the offending tooth.  This is been hurting primarily over the past 4 days but has been present for months.  Patient also complains of lower right quadrant pain and points to tooth numbers 30 and 31 as the offending teeth.  Pain is being caused primarily by the sharp edges associated with the retained root segments in this area.  Patient last saw dentist for Invisalign therapy.  This was approximately 4 years ago by patient report.  Since then, the teeth have all broken down secondary to her drug use by pateint report.  Patient denies having dental phobia.  PROBLEM LIST: Patient Active Problem List   Diagnosis Date Noted  . Elevated LFTs   . Septic arthritis of shoulder, left (HCC) 05/25/2019  . Septic arthritis of shoulder, right  (HCC) 05/25/2019  . PFO (patent foramen ovale)   . Endocarditis of tricuspid valve   . MRSA bacteremia 05/22/2019  . IV drug abuse (HCC)   . Septic embolism (HCC)   . Staphylococcal arthritis of right shoulder (HCC)   . Staphylococcal arthritis of left wrist (HCC)   . Sepsis (HCC) 05/19/2019  . Septic pulmonary embolism (HCC) 05/19/2019  . Substance use disorder 05/19/2019  . Suspected endocarditis 05/19/2019  . Compliance poor 11/15/2015  . Hepatitis C antibody positive, s/p spontaneously cleared infection 10/28/2015  . History of RPR test + 10/28/2015  . Anemia affecting pregnancy in third trimester 10/28/2015  . Supervision of high risk pregnancy in third trimester 10/28/2015  . History of cesarean section 10/02/2015  . Injection of illicit drug within last 12 months 10/02/2015  . Dichorionic diamniotic twin gestation 10/02/2015  . Anemia affecting pregnancy 10/02/2015    PMH: Past Medical History:  Diagnosis Date  . Hepatitis C   . Heroin abuse (HCC)   . Kidney stones     PSH: Past Surgical History:  Procedure Laterality Date  . BUBBLE STUDY  05/23/2019   Procedure: BUBBLE STUDY;  Surgeon: Elder Negus, MD;  Location: [facility] ENDOSCOPY;  Service: Cardiovascular;;  . CESAREAN SECTION  02/02/2012  . RADIOLOGY WITH ANESTHESIA N/A 05/24/2019   Procedure: MRI WITH ANESTHESIA - LUMBAR , SHOULDER;  Surgeon: Radiologist, Medication, MD;  Location: [facility] OR;  Service: Radiology;  Laterality: N/A;  .  SHOULDER ARTHROSCOPY Bilateral 05/25/2019   Procedure: ARTHROSCOPY SHOULDER;  Surgeon: Teryl Lucy, MD;  Location: [facility] OR;  Service: Orthopedics;  Laterality: Bilateral;  . TEE WITHOUT CARDIOVERSION N/A 05/23/2019   Procedure: TRANSESOPHAGEAL ECHOCARDIOGRAM (TEE);  Surgeon: Elder Negus, MD;  Location: [facility] ENDOSCOPY;  Service: Cardiovascular;  Laterality: N/A;    ALLERGIES: Allergies  Allergen Reactions  . Tylenol [Acetaminophen] Rash    Pt just reported tylenol allergy at  this visit at 2338pm    MEDICATIONS: Current Facility-Administered Medications  Medication Dose Route Frequency Provider Last Rate Last Admin  . bisacodyl (DULCOLAX) suppository 10 mg  10 mg Rectal Daily PRN Marguerita Merles Latif, DO      . carvedilol (COREG) tablet 3.125 mg  3.125 mg Oral BID WC Hollice Espy, MD   3.125 mg at 06/19/19 1020  . enoxaparin (LOVENOX) injection 40 mg  40 mg Subcutaneous Q24H Armida Sans, [place]   Stopped at 06/12/19 4097  . ferrous sulfate tablet 325 mg  325 mg Oral BID Armida Sans, PA-C   325 mg at 06/19/19 1022  . gabapentin (NEURONTIN) tablet 300 mg  300 mg Oral BID Hollice Espy, MD   300 mg at 06/19/19 1021  . hydrOXYzine (ATARAX/VISTARIL) tablet 10 mg  10 mg Oral TID PRN Dow Adolph N, DO   10 mg at 06/19/19 1105  . ibuprofen (ADVIL) tablet 400 mg  400 mg Oral Q6H PRN Hollice Espy, MD   400 mg at 06/19/19 1020  . lactated ringers infusion   Intravenous Continuous Armida Sans, PA-C 10 mL/hr at 05/24/19 0902 Continued from Pre-op at 05/24/19 0902  . magnesium oxide (MAG-OX) tablet 400 mg  400 mg Oral BID Dorcas Carrow, MD   400 mg at 06/19/19 1020  . methadone (DOLOPHINE) tablet 20 mg  20 mg Oral Daily Dow Adolph N, DO   20 mg at 06/19/19 1021  . nicotine (NICODERM CQ - dosed in mg/24 hours) patch 14 mg  14 mg Transdermal Daily Marguerita Merles Singer, DO   14 mg at 06/19/19 1024  . ondansetron (ZOFRAN) tablet 4 mg  4 mg Oral Q6H PRN Janine Ores K, PA-C   4 mg at 06/05/19 2254   Or  . ondansetron (ZOFRAN) injection 4 mg  4 mg Intravenous Q6H PRN Janine Ores K, PA-C   4 mg at 06/15/19 2044  . polyethylene glycol (MIRALAX / GLYCOLAX) packet 17 g  17 g Oral BID Marguerita Merles Latif, DO      . prenatal multivitamin tablet 1 tablet  1 tablet Oral Daily Janine Ores K, PA-C   1 tablet at 06/19/19 1022  . senna-docusate (Senokot-S) tablet 1 tablet  1 tablet Oral BID Marguerita Merles Seama, DO   1 tablet at 06/16/19 2020  . traZODone  (DESYREL) tablet 50 mg  50 mg Oral QHS PRN Marguerita Merles Yettem, DO   50 mg at 06/18/19 2106  . vancomycin (VANCOREADY) IVPB 750 mg/150 mL  750 mg Intravenous Q12H Marguerita Merles [place], [place] 150 mL/hr at 06/19/19 0956 750 mg at 06/19/19 0956    LABS: Lab Results  Component Value Date   WBC 5.2 06/15/2019   HGB 10.2 (L) 06/15/2019   HCT 33.5 (L) 06/15/2019   MCV 84.6 06/15/2019   PLT 301 06/15/2019      Component Value Date/Time   NA 137 06/15/2019 0742   K 4.4 06/15/2019 0742   CL 102 06/15/2019 0742   CO2 25 06/15/2019 0742  GLUCOSE 86 06/15/2019 0742   BUN 14 06/15/2019 0742   CREATININE 0.61 06/15/2019 0742   CALCIUM 9.3 06/15/2019 0742   GFRNONAA >60 06/15/2019 0742   GFRAA >60 06/15/2019 0742   Lab Results  Component Value Date   INR 1.3 (H) 05/20/2019   INR 1.3 (H) 05/18/2019   No results found for: PTT  SOCIAL HISTORY: Social History   Socioeconomic History  . Marital status: Married    Spouse name: Not on file  . Number of children: Not on file  . Years of education: Not on file  . Highest education level: Not on file  Occupational History  . Not on file  Tobacco Use  . Smoking status: Current Every Day Smoker    Packs/day: 1.00    Years: 5.00    Pack years: 5.00    Types: Cigarettes  . Smokeless tobacco: Never Used  Substance and Sexual Activity  . Alcohol use: No  . Drug use: Yes    Types: Heroin    Comment: Heroin, Xanax  . Sexual activity: Not Currently  Other Topics Concern  . Not on file  Social History Narrative  . Not on file   Social Determinants of Health   Financial Resource Strain:   . Difficulty of Paying Living Expenses: Not on file  Food Insecurity:   . Worried About Charity fundraiser in the Last Year: Not on file  . Ran Out of Food in the Last Year: Not on file  Transportation Needs:   . Lack of Transportation (Medical): Not on file  . Lack of Transportation (Non-Medical): Not on file  Physical Activity:   . Days of  Exercise per Week: Not on file  . Minutes of Exercise per Session: Not on file  Stress:   . Feeling of Stress : Not on file  Social Connections:   . Frequency of Communication with Friends and Family: Not on file  . Frequency of Social Gatherings with Friends and Family: Not on file  . Attends Religious Services: Not on file  . Active Member of Clubs or Organizations: Not on file  . Attends Archivist Meetings: Not on file  . Marital Status: Not on file  Intimate Partner Violence:   . Fear of Current or Ex-Partner: Not on file  . Emotionally Abused: Not on file  . Physically Abused: Not on file  . Sexually Abused: Not on file    FAMILY HISTORY: History reviewed. No pertinent family history.  REVIEW OF SYSTEMS: Reviewed with the patient as per History of present illness. Psych: Patient denies having dental phobia.  DENTAL HISTORY: CHIEF COMPLAINT: Referred by Dr. Alfredia Ferguson for dental consultation.  HPI: Christy Pearson is a 29 year old female recently admitted with tricuspid valve endocarditis.  Patient is currently on IV vancomycin antibiotic therapy for 6 weeks.  Last dose is anticipated on 06/30/2019.  Dental consultation was requested to evaluate poor dentition as a possible source of infection for the patient.  The patient is currently complaining of tooth pain involving the lower left and lower right quadrants.  The lower left quadrant is intermittent in nature and has reached an intensity of 10 out of 10 but is currently 0 out of 10 with recent Advil administration.  Patient points to tooth #20 as the offending tooth.  This is been hurting primarily over the past 4 days but has been present for months.  Patient also complains of lower right quadrant pain and points to tooth numbers 30  and 31 as the offending teeth.  Pain is being caused primarily by the sharp edges associated with the retained root segments in this area.  Patient last saw dentist for Invisalign therapy.   This was approximately 4 years ago by patient report.  Since then, the teeth have all broken down secondary to her drug use by pateint report.  Patient denies having dental phobia.   DENTAL EXAMINATION: GENERAL: The patient is a well-developed, slightly built female no acute distress. HEAD AND NECK: There is no palpable neck lymphadenopathy.  The patient denies acute TMJ symptoms. INTRAORAL EXAM: Patient has normal saliva.  There is no evidence of oral abscess formation. DENTITION: Patient with missing tooth #32.  There are retained root segments in the area tooth numbers 1, 3, 16, 17, 18, 19, 30, and 31. PERIODONTAL: Patient has chronic periodontitis with plaque and calculus accumulations, gingival recession, and incipient to moderate bone loss. DENTAL CARIES/SUBOPTIMAL RESTORATIONS: Patient has rampant dental caries affecting almost all remaining teeth. ENDODONTIC: She does have a history of acute pulpitis symptoms involving tooth #20.  Patient has multiple areas of periapical pathology and radiolucency affecting multiple retained root segments and teeth. CROWN AND BRIDGE: There are no crown or bridge restorations. PROSTHODONTIC: Patient denies having any partial dentures. OCCLUSION: Patient has a poor occlusal scheme secondary to missing teeth, retained root segments, and supra eruption drifting of the unopposed teeth into the edentulous areas.  RADIOGRAPHIC INTERPRETATION: Orthopantogram was taken on 06/17/19. There is missing tooth #32.  There are retained root segments in the area tooth numbers 1, 3, 16, 17, 18, 19, 30, and 31.  The patient has rampant dental caries.  There are multiple areas of periapical pathology and radiolucency.  There is supra eruption and drifting of the unopposed teeth into the edentulous areas.   ASSESSMENTS: 1.  Tricuspid valve endocarditis 2.  History of acute pulpitis 3.  Chronic apical periodontitis 4.  Rampant dental caries 5.  Multiple retained root  segments 6.  Chronic periodontitis with bone loss 7.  Gingival recession 8.  Accretions 9.  Multiple missing teeth 10.  Supra eruption and drifting of the unopposed teeth into the edentulous area 11.  Poor occlusal scheme and malocclusion 12.  Risk for bleeding with invasive dental procedures due to current Lovenox therapy 13.  Need for antibiotic premedication prior to invasive dental procedures due to history of tricuspid valve endocarditis  PLAN/RECOMMENDATIONS: 1. I discussed the risks, benefits, and complications of various treatment options with the patient in relationship to her medical and dental conditions, tricuspid valve endocarditis, and risk for future endocarditis.  We discussed various treatment options to include no treatment, total and subtotal extractions with alveoloplasty, pre-prosthetic surgery as indicated, periodontal therapy, dental restorations, root canal therapy, crown and bridge therapy, implant therapy, and replacement of missing teeth as indicated. The patient currently wishes to proceed with extraction of tooth numbers 1, 2, 3, 16, 17, 18, 19, 20, 29, 30, and 31 with alveoloplasty in the operating room with general anesthesia.  She will be scheduled as time and space permits in the operating room schedule later this week or early next week.  The patient will then need to follow-up with the primary dentist of her choice for periodontal maintenance procedures, dental restorations, and evaluation for replacement missing teeth as indicated after adequate healing and once medically stable from her recent admission.    2. Discussion of findings with medical team and coordination of future medical and dental care as needed.  Lenn Cal, DDS

## 2019-06-19 NOTE — Progress Notes (Signed)
Physical Therapy Treatment and Discharge Patient Details Name: Christy Pearson MRN: [id number] DOB: 1990/05/11 Today's Date: 06/19/2019    History of Present Illness Pt is a 29 y/o female admitted with fevere chills and back pain. 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 PMH hepatitis C, Kidney stones, IV drug abuse,     PT Comments    Following up on PT evaluation done last week; Pt is independent with all mobility and noting that OT is following as well and gave HEP for her bilateral shoulders; Will defer to OT; PT is signing off  Follow Up Recommendations  Other (comment)(Outpt PT or OT for shoulders)     Equipment Recommendations  None recommended by PT    Recommendations for Other Services       Precautions / Restrictions Precautions Precaution Comments: Contact Precautions Restrictions RUE Weight Bearing: Weight bearing as tolerated LUE Weight Bearing: Weight bearing as tolerated    Mobility  Bed Mobility Overal bed mobility: Independent                Transfers Overall transfer level: Independent Equipment used: None                Ambulation/Gait Ambulation/Gait assistance: Independent Social research officer, government (Feet): 1000 Feet Assistive device: None;IV Pole Gait Pattern/deviations: WFL(Within Functional Limits)     General Gait Details: No difficulty walking hallway; Reportsshe and her visistor went down to the cafeteria earlier   Stairs Stairs: Yes Stairs assistance: Modified independent (Device/Increase time) Stair Management: One rail Left;Forwards;Alternating pattern Number of Stairs: 6 General stair comments: No difficulty   Wheelchair Mobility    Modified Rankin (Stroke Patients Only)       Balance Overall balance assessment: No apparent balance deficits (not formally assessed)                                          Cognition Arousal/Alertness: Awake/alert Behavior During Therapy: WFL for tasks assessed/performed Overall Cognitive Status: Within Functional Limits for tasks assessed                                        Exercises      General Comments        Pertinent Vitals/Pain Pain Assessment: Faces Faces Pain Scale: No hurt    Home Living                      Prior Function            PT Goals (current goals can now be found in the care plan section) Acute Rehab PT Goals Patient Stated Goal: to improve arm movement and strength Progress towards PT goals: Goals met/education completed, patient discharged from PT    Frequency           PT Plan Discharge plan needs to be updated    Co-evaluation              AM-PAC PT "6 Clicks" Mobility   Outcome Measure  Help needed turning from your back to your side while in a flat bed without using bedrails?: None Help needed moving from lying on your back to sitting on the side of a  flat bed without using bedrails?: None Help needed moving to and from a bed to a chair (including a wheelchair)?: None Help needed standing up from a chair using your arms (e.g., wheelchair or bedside chair)?: None Help needed to walk in hospital room?: None Help needed climbing 3-5 steps with a railing? : None 6 Click Score: 24    End of Session   Activity Tolerance: Patient tolerated treatment well Patient left: Other (comment)(Managing indpeendently in her room) Nurse Communication: Mobility status PT Visit Diagnosis: Muscle weakness (generalized) (M62.81)     Time: 0684-0335 PT Time Calculation (min) (ACUTE ONLY): 19 min  Charges:  $Gait Training: 8-22 mins                     Roney Marion, [place] Pager [phone] Office [place] 06/19/2019, 6:18 PM

## 2019-06-20 DIAGNOSIS — R7989 Other specified abnormal findings of blood chemistry: Secondary | ICD-10-CM | POA: Diagnosis not present

## 2019-06-20 DIAGNOSIS — I079 Rheumatic tricuspid valve disease, unspecified: Secondary | ICD-10-CM | POA: Diagnosis not present

## 2019-06-20 DIAGNOSIS — R7881 Bacteremia: Secondary | ICD-10-CM | POA: Diagnosis not present

## 2019-06-20 DIAGNOSIS — R768 Other specified abnormal immunological findings in serum: Secondary | ICD-10-CM | POA: Diagnosis not present

## 2019-06-20 LAB — PROLACTIN: Prolactin: 29.3 ng/mL — ABNORMAL HIGH (ref 4.8–23.3)

## 2019-06-20 MED ORDER — CEFAZOLIN (ANCEF) 1 G IV SOLR
2.0000 g | INTRAVENOUS | Status: AC
  Filled 2019-06-20: qty 2

## 2019-06-20 MED ORDER — MAGNESIUM SULFATE 2 GM/50ML IV SOLN
2.0000 g | Freq: Once | INTRAVENOUS | Status: AC
  Administered 2019-06-20: 2 g via INTRAVENOUS
  Filled 2019-06-20: qty 50

## 2019-06-20 NOTE — Progress Notes (Signed)
PROGRESS NOTE    Christy Pearson  KDX:[id number] DOB: 13-Feb-1991 DOA: 05/18/2019 PCP: Patient, No Pcp Per  Brief Narrative:  The patient is a 29 year old Caucasian female with a past medical history significant for but not limited to hepatitis C with spontaneous clearance, polysubstance abuse with heroin and crystal meth along with multiple complications who was admitted on 05/18/2019 for chief complaint of right flank pain.  She is worked up and currently being treated for septic emboli, bacteremia, tricuspid valve endocarditis, bilateral shoulder and spine infections.  She is admitted to the hospital service after CT scan noted multiple pulmonary cavitary lesions felt to be from septic emboli and ensuing work-up revealed positive blood cultures for MRSA and a TEE noted tricuspid valve endocarditis and a suspected PFO as well as bilateral shoulder and spine infection.  She underwent arthrocentesis and debridement done by orthopedic surgery on 05/25/2019 and she is also seen by infectious diseases who recommended 6 weeks of IV antibiotics which she will complete in house on 06/30/2019.  She is to remain in the hospital complete the course of IV vancomycin for MRSA bacteremia given that she is an IV drug user requires supervision.  Her hospital course has also been notable for a blood transfusion and IV iron secondary to anemia of chronic disease.  She also had a UTI which was treated and a PICC line was placed for long-term IV antibiotics on 06/04/2019.  She currently complains of some chronic leg cramps and complains of difficulty sleeping.  **Interim History  She was placed on methadone and during the course of her hospitalization has been increased to 20 mg and during the course of her hospitalization has been increased to 20 mg and we will not increase further and will attempt to wean back soon but her pain is well tolerated at current dose.  She complains of some bilateral shoulder pain and left wrist  pain and will obtain OT evaluation again.  PT recommends follow-up.  Hospitalization has been complicated by some constipation and some dental pain.  Now she also is requesting a nicotine patch given that she is constantly eating.  The pain was evaluated with a Panorex and she had several dental caries and periapical abscesses.  I called dentistry and she is going to have several teeth removed on Friday.  Neurology was reconsulted for preoperative clearance and had no further recommendations and have cleared the patient for the procedure.  We will have cardiothoracic surgery weigh in again to discuss about her valve replacement.   Assessment & Plan:   Principal Problem:   MRSA bacteremia Active Problems:   Injection of illicit drug within last 12 months   Hepatitis C antibody positive, s/p spontaneously cleared infection   Sepsis (HCC)   Septic pulmonary embolism (HCC)   Substance use disorder   Suspected endocarditis   IV drug abuse (HCC)   Septic embolism (HCC)   Staphylococcal arthritis of right shoulder (HCC)   Staphylococcal arthritis of left wrist (HCC)   PFO (patent foramen ovale)   Endocarditis of tricuspid valve   Septic arthritis of shoulder, left (HCC)   Septic arthritis of shoulder, right (HCC)   Elevated LFTs  MRSA bacteremiasecondary to IV drug use, severe sepsis, tricuspid valve endocarditis, multilevel vertebral osteomyelitis, septic pulmonary emboli, bilateral septic shoulder arthritis and suspected left wrist septic arthrits. -Stabilized with fluids and antibiotics.  -Status post R shoulder arthrocentesis and debridement by orthopedic surgery 2/4.  Orthopedic surgery still following intermittently and they are recommending weightbearing  as tolerated bilateral upper extremities and continue to work on range of motion of bilateral shoulders -Complained of Left Wrist Pain to nurse today -TCTS evaluated and Dr. Cliffton AstersLightfoot wrote "Hx of IV drug use now with evidence of MRSA  tricuspid valve endocarditis, severe TR, PFO, and septic arthritis. Most recent use was 1 day prior to admission. Most recent blood cultures are negative. The vegetation is small, and likely affecting the anterior and posterior leaftlets. Unfortunately due to the PFO, she is not a candidate for Angiovac debridement. Due to the septic arthritis, she would not be a candidate for surgical valve replacement. If we are able to clear extra-cardiac sources of infection, and obtain some degree of drug rehabilitation, we would discuss the option of surgical repair of her valve." -I have asked Dr. Cliffton AstersLightfoot to reevaluate for her long-term plans for valve replacement -Will get PT/OT to evaluate and treat again; PT recommends no follow-up and OT recommends Outpatient OT  -Continue IV vancomycin per infectious disease, total 6 weeks to complete March 12.  Continue close monitoring of her renal function as she is on vancomycin along with vancomycin peaks and troughs -Not a candidate for outpatient treatment given her IVDA -Cardiology will be obtaining an echocardiogram for preoperative clearance for dental extractions -Echo is unchanged compared to 4 weeks ago when she still has severe tricuspid regurgitation.  Urology does not see any prohibitive risk for general anesthesia for dental extraction and recommended long-term plans for valve replacement with CT surgery and I have asked Dr. Cliffton AstersLightfoot as above to reevaluate -CRP was 1.7  IV drug use within last 12 months.  -Patient had requested to increase dose from 12.5 mg daily to 15 mg daily and now to 20 mg Daily and this was done by Dr. Margo AyeHall as the patient told Dr. Margo AyeHall that low-dose methadone is causing her to have excessive sweats.  We will not escalate any further and have judicious use of narcotics and attempt to wean back her methadone if able and she previously requested a slow wean from methadone and will attempt to have milligrams every 3 days if  able -Currently has pain control with gabapentin 300 mg p.o. twice daily, ibuprofen 400 mg p.o. every 6 as needed for moderate pain, along with her methadone -States that in her close environment friends and family are IVDAs. -We will need to monitor her extremely carefully -As above  Suspected S1 nerve compressionsecondary to vertebral osteomyelitis.  -No neurologic deficit previously noted.  -Seen by neurosurgery with no intervention recommended. -Continue to monitor carefully  Anemia of Chronic Disease. -Continue with ferrous sulfate 325 mg p.o. twice daily -Status post 1 unit PRBC as well as IV iron. -Hemoglobin/hematocrit is now 10.4/32.8 -Continue to Monitor for S/Sx of Bleeding; Currently no overt Bleeding Noted -Repeat CBC intermittently   Insomnia/sleep disturbance -Switched back to Trazodone 50 mg p.o. nightly as needed  Abnormal LFTs -Transaminitis and LFTs have improved is normalized -Initially felt secondary to sepsis and possible shock liver -Liver ultrasound was benign -On last LFT check showed AST of 23 and ALT of 24 -Repeat CMP intermittently  E. Coli UTI -She was treated with IV Rocephin course -Continue to monitor  Leg Cramps -Continue with Gabapentin 300 mg po BID  Hyperphosphatemia -Mild as Phos Level was 4.9 yesterday -Continue to Monitor closely and repeat intermittently  Constipation -Start a bowel regimen with senna docusate 1 tab p.o. twice daily, MiraLAX 17 g p.o. twice daily, bisacodyl 10 mg rectal suppository daily as  needed -If not improving will give the patient an enema  Hypomagnesemia -Patient magnesium level was 1.6 -Replete with IV mag sulfate 2 g -Continue to monitor and replete as necessary -Repeat magnesium level intermittently  Dental Pain in the setting of her multiple dental caries as well as periapical abscesses -Ordered an Orthopantogram to evaluate and showed "Multiple bilateral dental caries as well as periapical  abscesses of teeth 18, 19, 20, 30 and 31." -Have Consulted Dr. Dian Queen of Dentistry to further evaluate and appreciate his Recc's -Dr. Kristin Bruins is reviewed her orthopantogram and feels that the patient needs multiple extractions and requested preoperative clearance to see if she was safe for general anesthesia -I consulted Dr. Priscella Mann for further cardiac clearance and he recommends obtaining an echocardiogram and as below and cardiology feels that she has no prohibitive risk for general anesthesia for dental extraction -Dental surgery recommends multiple extractions (11 teeth) listed in their note this can to be done on Friday  Tobacco Abuse -Patient states that she smokes 1 pack/day -We will start nicotine patch 14 mg transdermal daily -Smoking cessation counseling given  Missed Period -Checked TSH and was 2.774, Prolactin was 29.3, and Pregnancy Test -? Related to illness as above -Urine Pregnancy Test Negative and Beta HCG was 1 -Continue to monitor carefully  DVT prophylaxis: Enoxaparin 40 mg sq q24h Code Status: FULL CODE  Family Communication: No family present at bedside  Disposition Plan: Patient is from home and she is high risk to be discharged with IV antibiotics which she will remain hospitalized until completion of her antibiotic course on 06/30/2019  Consultants:   Cardiology  Orthopedic Surgery  Cardiothoracic surgery  Infectious Diseases  Dentistry   Procedures:   Transfusion 1 unit packed red blood cells  2D echocardiogram done 1/29: Moderate to severe tricuspid regurg with suspicions for tricuspid endocarditis although vegetation not seen.  TEE done 1/29: Severe tricuspid valve regurg with moderately sized mobile tricuspid valve vegetation on 2 leaflets. Preserved ejection fraction  Arthroscopy of her shoulder  PICC line placed 2/14   ECHOCARDIOGRAM 06/19/19 IMPRESSIONS    1. Left ventricular ejection fraction by PLAX is 70 %. Left  ventricular  endocardial border not optimally defined to evaluate regional wall motion.  Left ventricular diastolic function could not be evaluated.  2. Right ventricular systolic function is normal. The right ventricular  size is normal. There is mildly elevated pulmonary artery systolic  pressure.  3. Right atrial size was severely dilated.  4. 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  5. No significant change compared to TEE on 05/23/2019.   FINDINGS  Left Ventricle: Left ventricular ejection fraction by PLAX is 70 % Left  ventricular endocardial border not optimally defined to evaluate regional  wall motion.   Right Ventricle: The right ventricular size is normal. Right ventricular  systolic function is normal. There is mildly elevated pulmonary artery  systolic pressure. The tricuspid regurgitant velocity is 2.74 m/s, and  with an assumed right atrial pressure  of 8 mmHg, the estimated right ventricular systolic pressure is 38.0 mmHg.   Right Atrium: Right atrial size was severely dilated.   Pericardium: Trivial pericardial effusion is present. The pericardial  effusion is circumferential.   Mitral Valve: The mitral valve is grossly normal.   Tricuspid Valve: Vegetations on septal and posterior leaflets (1X0.5 cm).  The tricuspid valve is abnormal. Tricuspid valve regurgitation is severe.   Aortic Valve: The aortic valve is grossly normal. Aortic valve  regurgitation is not  visualized.   Aorta: The aortic root is normal in size and structure.   Venous: The inferior vena cava is normal in size with less than 50%  respiratory variability, suggesting right atrial pressure of 8 mmHg. The  inferior vena cava and the hepatic vein show a pattern of systolic flow  reversal, suggestive of tricuspid  regurgitation.   IAS/Shunts: There is redundancy of the interatrial septum. No atrial level  shunt detected by color flow Doppler.     LEFT VENTRICLE   PLAX 2D  LV EF:     Left ventricular ejection fraction by PLAX is 70 %  LVIDd:     4.60 cm  LVIDs:     2.80 cm  LV PW:     1.10 cm  LV IVS:    0.70 cm     RIGHT VENTRICLE     IVC  RV Basal diam: 3.80 cm IVC diam: 2.00 cm  RV Mid diam:  4.60 cm   LEFT ATRIUM      Index    RIGHT ATRIUM      Index  LA diam:   3.00 cm 1.81 cm/m RA Area:   21.40 cm  LA Vol (A4C): 23.1 ml 13.97 ml/m RA Volume:  62.00 ml 37.49 ml/m    AORTA  Ao Root diam: 2.70 cm   TRICUSPID VALVE  TR Peak grad:  30.0 mmHg  TR Vmax:    274.00 cm/s    Antimicrobials:  Anti-infectives (From admission, onward)   Start     Dose/Rate Route Frequency Ordered Stop   06/23/19 0730  ceFAZolin (ANCEF) powder 2 g     2 g Other To Surgery 06/20/19 1302 06/24/19 0730   06/19/19 2300  vancomycin (VANCOCIN) IVPB 1000 mg/200 mL premix  Status:  Discontinued     1,000 mg 200 mL/hr over 60 Minutes Intravenous Every 12 hours 06/19/19 2156 06/19/19 2159   06/19/19 2300  vancomycin (VANCOREADY) IVPB 750 mg/150 mL     750 mg 150 mL/hr over 60 Minutes Intravenous Every 12 hours 06/19/19 2159     06/19/19 2200  vancomycin (VANCOREADY) IVPB 750 mg/150 mL  Status:  Discontinued     750 mg 150 mL/hr over 60 Minutes Intravenous Every 12 hours 06/19/19 1448 06/19/19 2156   06/18/19 2200  vancomycin (VANCOREADY) IVPB 750 mg/150 mL  Status:  Discontinued     750 mg 150 mL/hr over 60 Minutes Intravenous Every 12 hours 06/18/19 1221 06/18/19 1225   06/18/19 2100  vancomycin (VANCOREADY) IVPB 750 mg/150 mL  Status:  Discontinued     750 mg 150 mL/hr over 60 Minutes Intravenous Every 12 hours 06/18/19 1225 06/19/19 1448   06/09/19 0800  vancomycin (VANCOREADY) IVPB 750 mg/150 mL  Status:  Discontinued     750 mg 150 mL/hr over 60 Minutes Intravenous Every 12 hours 06/08/19 1840 06/18/19 1221   06/03/19 1800  vancomycin (VANCOCIN) IVPB 1000 mg/200 mL premix  Status:  Discontinued      1,000 mg 200 mL/hr over 60 Minutes Intravenous Every 12 hours 06/03/19 0456 06/08/19 1841   05/31/19 0200  vancomycin (VANCOREADY) IVPB 750 mg/150 mL  Status:  Discontinued     750 mg 150 mL/hr over 60 Minutes Intravenous Every 12 hours 05/31/19 0126 06/03/19 0456   05/30/19 2200  vancomycin (VANCOREADY) IVPB 750 mg/150 mL  Status:  Discontinued     750 mg 150 mL/hr over 60 Minutes Intravenous Every 12 hours 05/30/19 1207 05/30/19 2126   05/25/19 1230  amoxicillin (AMOXIL) capsule 500 mg     500 mg Oral  Once 05/25/19 1120 05/25/19 1435   05/21/19 1000  vancomycin (VANCOCIN) IVPB 1000 mg/200 mL premix  Status:  Discontinued     1,000 mg 200 mL/hr over 60 Minutes Intravenous Every 12 hours 05/21/19 0948 05/30/19 1207   05/20/19 1400  cefTRIAXone (ROCEPHIN) 2 g in sodium chloride 0.9 % 100 mL IVPB  Status:  Discontinued     2 g 200 mL/hr over 30 Minutes Intravenous Every 24 hours 05/20/19 1328 05/22/19 0947   05/19/19 2200  levofloxacin (LEVAQUIN) IVPB 750 mg  Status:  Discontinued     750 mg 100 mL/hr over 90 Minutes Intravenous Every 24 hours 05/19/19 0212 05/19/19 0213   05/19/19 2200  levofloxacin (LEVAQUIN) IVPB 500 mg  Status:  Discontinued     500 mg 100 mL/hr over 60 Minutes Intravenous Every 24 hours 05/19/19 0213 05/19/19 0539   05/19/19 1000  vancomycin (VANCOREADY) IVPB 750 mg/150 mL  Status:  Discontinued     750 mg 150 mL/hr over 60 Minutes Intravenous Every 12 hours 05/19/19 0539 05/21/19 0948   05/19/19 0600  ceFEPIme (MAXIPIME) 2 g in sodium chloride 0.9 % 100 mL IVPB  Status:  Discontinued     2 g 200 mL/hr over 30 Minutes Intravenous Every 8 hours 05/19/19 0539 05/19/19 2348   05/19/19 0130  vancomycin (VANCOCIN) IVPB 1000 mg/200 mL premix     1,000 mg 200 mL/hr over 60 Minutes Intravenous  Once 05/19/19 0122 05/19/19 0253   05/18/19 2345  levofloxacin (LEVAQUIN) IVPB 750 mg     750 mg 100 mL/hr over 90 Minutes Intravenous  Once 05/18/19 2338 05/19/19 0140      Subjective: Seen and examined at bedside still complaining of sore shoulder but states that she has been laying on the more and does not like the bed.  States that her back is sore as well and states that she has a hard time moving and getting out of bed due to the condition of the bed.  No chest pain, lightheadedness or dizziness.  Worried about getting her teeth extracted but understands that this needs to be done given that she is in a lot of pain and she is at risk for further endocarditis.  No other concerns or complaints at this time and states that she is ambulating the halls without issues.  Objective: Vitals:   06/19/19 0652 06/19/19 1424 06/19/19 2112 06/20/19 0457  BP: 110/81 108/79 (!) 121/94 105/67  Pulse: 92 100 (!) 104 93  Resp: 18 16 16 16   Temp: 98.8 F (37.1 C) 98.3 F (36.8 C) 98.4 F (36.9 C) 98.1 F (36.7 C)  TempSrc: Oral Oral Oral Oral  SpO2: 100% 100% 99% 100%  Weight:      Height:        Intake/Output Summary (Last 24 hours) at 06/20/2019 1348 Last data filed at 06/20/2019 1030 Gross per 24 hour  Intake 600 ml  Output --  Net 600 ml   Filed Weights   05/19/19 0400 05/24/19 0836 05/25/19 1728  Weight: 56.5 kg 56.5 kg 56.5 kg   Examination: Physical Exam:  Constitutional: Patient is a thin Caucasian female currently in no acute distress appears calm but complains of being sore and states that the bed is very uncomfortable Respiratory: Diminished but unlabored breathing and has no appreciable wheezing, rales, rhonchi. Cardiovascular: Tachycardic rate but regular rhythm.  Pedal edema has improved Abdomen: Soft, nontender, nondistended.  Bowel  sounds present and she does have a tattoo on her abdomen Musculoskeletal: No contractures or cyanosis.  No joint deformities noted Skin: Multiple skin tattoos diffusely scattered throughout her body.  No appreciable rashes or lesions on limited skin evaluation Psychiatric: Has a normal mood and affect.  She is awake  and alert and oriented.  Data Reviewed: I have personally reviewed following labs and imaging studies  CBC: Recent Labs  Lab 06/15/19 0742 06/19/19 1328  WBC 5.2 5.1  NEUTROABS 2.7 3.1  HGB 10.2* 10.4*  HCT 33.5* 33.8*  MCV 84.6 86.2  PLT 301 280   Basic Metabolic Panel: Recent Labs  Lab 06/15/19 0742 06/19/19 1328  NA 137 138  K 4.4 4.1  CL 102 104  CO2 25 22  GLUCOSE 86 103*  BUN 14 11  CREATININE 0.61 0.57  CALCIUM 9.3 9.1  MG 1.9 1.6*  PHOS 4.8* 4.9*   GFR: Estimated Creatinine Clearance: 95 mL/min (by C-G formula based on SCr of 0.57 mg/dL). Liver Function Tests: Recent Labs  Lab 06/15/19 0742 06/19/19 1328  AST 24 23  ALT 21 24  ALKPHOS 128* 125  BILITOT 0.5 0.6  PROT 7.3 7.2  ALBUMIN 3.0* 3.3*   No results for input(s): LIPASE, AMYLASE in the last 168 hours. No results for input(s): AMMONIA in the last 168 hours. Coagulation Profile: No results for input(s): INR, PROTIME in the last 168 hours. Cardiac Enzymes: No results for input(s): CKTOTAL, CKMB, CKMBINDEX, TROPONINI in the last 168 hours. BNP (last 3 results) No results for input(s): PROBNP in the last 8760 hours. HbA1C: No results for input(s): HGBA1C in the last 72 hours. CBG: No results for input(s): GLUCAP in the last 168 hours. Lipid Profile: No results for input(s): CHOL, HDL, LDLCALC, TRIG, CHOLHDL, LDLDIRECT in the last 72 hours. Thyroid Function Tests: Recent Labs    06/19/19 1328  TSH 2.774   Anemia Panel: No results for input(s): VITAMINB12, FOLATE, FERRITIN, TIBC, IRON, RETICCTPCT in the last 72 hours. Sepsis Labs: No results for input(s): PROCALCITON, LATICACIDVEN in the last 168 hours.  No results found for this or any previous visit (from the past 240 hour(s)).   Radiology Studies: ECHOCARDIOGRAM LIMITED  Result Date: 06/19/2019    ECHOCARDIOGRAM LIMITED REPORT   Patient Name:   KAMEREN BAADE Date of Exam: 06/19/2019 Medical Rec #:  [id number]       Height:       67.0  in Accession #:    [id number]      Weight:       124.6 lb Date of Birth:  20-Jun-1990       BSA:          1.654 m Patient Age:    28 years        BP:           110/81 mmHg Patient Gender: F               HR:           108 bpm. Exam Location:  Inpatient Procedure: Limited Echo, Limited Color Doppler and Cardiac Doppler Indications:     Endocarditis  History:         Patient has prior history of Echocardiogram examinations, most                  recent 05/23/2019. 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  Sonographer:     Thurman Coyer RDCS (AE) Referring Phys:  [id number] [facility] J PATWARDHAN Diagnosing Phys: Evlyn Clines Patwardhan  MD IMPRESSIONS  1. Left ventricular ejection fraction by PLAX is 70 %. Left ventricular endocardial border not optimally defined to evaluate regional wall motion. Left ventricular diastolic function could not be evaluated.  2. Right ventricular systolic function is normal. The right ventricular size is normal. There is mildly elevated pulmonary artery systolic pressure.  3. Right atrial size was severely dilated.  4. 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  5. No significant change compared to TEE on 05/23/2019. FINDINGS  Left Ventricle: Left ventricular ejection fraction by PLAX is 70 % Left ventricular endocardial border not optimally defined to evaluate regional wall motion. Right Ventricle: The right ventricular size is normal. Right ventricular systolic function is normal. There is mildly elevated pulmonary artery systolic pressure. The tricuspid regurgitant velocity is 2.74 m/s, and with an assumed right atrial pressure of 8 mmHg, the estimated right ventricular systolic pressure is 38.0 mmHg. Right Atrium: Right atrial size was severely dilated. Pericardium: Trivial pericardial effusion is present. The pericardial effusion is circumferential. Mitral Valve: The mitral valve is grossly normal. Tricuspid Valve: Vegetations on septal and posterior leaflets (1X0.5 cm). The tricuspid  valve is abnormal. Tricuspid valve regurgitation is severe. Aortic Valve: The aortic valve is grossly normal. Aortic valve regurgitation is not visualized. Aorta: The aortic root is normal in size and structure. Venous: The inferior vena cava is normal in size with less than 50% respiratory variability, suggesting right atrial pressure of 8 mmHg. The inferior vena cava and the hepatic vein show a pattern of systolic flow reversal, suggestive of tricuspid regurgitation. IAS/Shunts: There is redundancy of the interatrial septum. No atrial level shunt detected by color flow Doppler.  LEFT VENTRICLE PLAX 2D LV EF:         Left ventricular ejection fraction by PLAX is 70 % LVIDd:         4.60 cm LVIDs:         2.80 cm LV PW:         1.10 cm LV IVS:        0.70 cm  RIGHT VENTRICLE          IVC RV Basal diam:  3.80 cm  IVC diam: 2.00 cm RV Mid diam:    4.60 cm LEFT ATRIUM           Index       RIGHT ATRIUM           Index LA diam:      3.00 cm 1.81 cm/m  RA Area:     21.40 cm LA Vol (A4C): 23.1 ml 13.97 ml/m RA Volume:   62.00 ml  37.49 ml/m   AORTA Ao Root diam: 2.70 cm TRICUSPID VALVE TR Peak grad:   30.0 mmHg TR Vmax:        274.00 cm/s Manish Patwardhan MD Electronically signed by Truett Mainland MD Signature Date/Time: 06/19/2019/1:04:18 PM    Final     Scheduled Meds: . carvedilol  3.125 mg Oral BID WC  . [START ON 06/23/2019] ceFAZolin  2 g Other To OR  . enoxaparin (LOVENOX) injection  40 mg Subcutaneous Q24H  . ferrous sulfate  325 mg Oral BID  . gabapentin  300 mg Oral BID  . magnesium oxide  400 mg Oral BID  . methadone  20 mg Oral Daily  . nicotine  14 mg Transdermal Daily  . polyethylene glycol  17 g Oral BID  . prenatal multivitamin  1 tablet Oral Daily  . senna-docusate  1  tablet Oral BID   Continuous Infusions: . lactated ringers 10 mL/hr at 05/24/19 0902  . vancomycin 750 mg (06/20/19 1300)    LOS: 32 days   Merlene Laughter, DO [facility] PAGER is on AMION  If  7PM-7AM, please contact night-coverage [email or web address]

## 2019-06-20 NOTE — Plan of Care (Signed)

## 2019-06-21 DIAGNOSIS — R7881 Bacteremia: Secondary | ICD-10-CM | POA: Diagnosis not present

## 2019-06-21 DIAGNOSIS — B9562 Methicillin resistant Staphylococcus aureus infection as the cause of diseases classified elsewhere: Secondary | ICD-10-CM | POA: Diagnosis not present

## 2019-06-21 NOTE — Progress Notes (Signed)
OT Cancellation Note  Patient Details Name: Keauna Brasel MRN: [id number] DOB: [birth year]   Cancelled Treatment:    Reason Eval/Treat Not Completed: Other (comment).  Checked on pt x 2.  She had just eaten and now has a visitor.  She requests that we return tomorrow, if possible.  She reports that she has been doing exercises, and I told her we want to have her go through program with [place] present to make sure that she is doing them without compensations  Yadiel Aubry 06/21/2019, 2:41 PM  Roxana Hires, OTR/L Acute Rehabilitation Services 06/21/2019

## 2019-06-21 NOTE — Progress Notes (Signed)
Patient not in room when I came by to reexamine shoulders and wrist, will try again tomorrow.   Janine Ores, PA-C

## 2019-06-21 NOTE — Progress Notes (Signed)
PROGRESS NOTE  Christy Pearson  DOB: [birth year]  PCP: Patient, No Pcp Per HQP:[id number]  DOA: 05/18/2019 Admitted From: Home  LOS: 33 days   Chief Complaint  Patient presents with  . Fever  . Back Pain   Brief narrative: The patient is a 29 year old Caucasian female with PMH of polysubstance abuse including IV heroin, crystal meth, history of hepatitis C.  She presented to the ED on 1/28 with complaint of not feeling well for 2 weeks with fever, chest pain, cough,\flank pain and weakness.  She had a fever of 103.7.  WBC count normal. CT scan showed bilateral cavitary nodules, pneumonia concerning for septic emboli. Blood cultures sent on admission came positive for MRSA. TEE noted tricuspid valve endocarditis. She was also noted to have bilateral shoulder and spine infection.   2/4, she underwent arthroscopy, irrigation, debridement of both shoulder joints by orthopedic surgery. ID recommended 6 weeks of IV vancomycin.  PICC line placed on 2/14.  Patient will complete the course of IV vancomycin on 06/30/2019.   She is to remain in the hospital to complete the course of IV vancomycin for MRSA bacteremia given that she is an IV drug user requires supervision.    Her hospital course has also been notable for a blood transfusion and IV iron secondary to anemia of chronic disease.  She also had a UTI which was treated and a PICC line was placed for long-term IV antibiotics on 06/04/2019. 3/1, patient was seen by dentist Dr. Kristin Bruins to evaluate poor dentition as a possible source of infection for this patient. She is planned for extraction of multiple teeth tentatively on Friday 3/5.  Subjective: Patient was seen and examined this afternoon.  Young Caucasian female.  Sitting up at the edge of the bed.  Not in distress.  She is concerned about pain management post dental extraction.  I told her that we can use IV narcotics if she needed at that time for a brief period of  time.  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 -2/4, she underwent arthroscopy, irrigation, debridement of both shoulder joints by orthopedic surgery. -ID recommended 6 weeks of IV vancomycin.  PICC line placed on 2/14.  -Patient will complete the course of IV vancomycin on 06/30/2019.   -She is to remain in the hospital to complete the course of IV vancomycin for MRSA bacteremia given that she is an IV drug user requires supervision.    History of IV drug abuse -Currently pain controlled on methadone 20 mg daily, gabapentin 300 mg twice daily, ibuprofen as needed every 6 hours.  Anemia of chronic disease -Status post 1 unit PRBC transfusion as well as IV iron therapy.   -Continue ferrous sulfate.  Insomnia/sleep disturbance -Switched back to Trazodone 50 mg p.o. nightly as needed  E. Coli UTI - POA -Completed course of IV Rocephin.  Leg Cramps -Continue with Gabapentin 300 mg po BID  Hypomagnesemia/hypophosphatemia -Improved with replacement.  Poor dental hygiene  multiple dental caries, periapical abscess -Dr. Kristin Bruins is reviewed her orthopantogram and feels that the patient needs multiple extractions and requested preoperative clearance to see if she was safe for general anesthesia -3/1, patient was seen by dentist Dr. Kristin Bruins to evaluate poor dentition as a possible source of infection for this patient. -She is planned for extraction of multiple teeth tentatively on Friday 3/5.  Tobacco Abuse -Patient states that she smokes 1 pack/day -We will start nicotine patch 14 mg transdermal daily -Smoking cessation counseling given  DVT prophylaxis:  Lovenox Antimicrobials:  IV vancomycin Fluid: None Diet: Regular diet  Code Status:  Full  code Mobility: Encourage ambulation Family Communication:  None at bedside Discharge plan:  Anticipated date: IV vancomycin till 3/12 Disposition: Home Barriers: Need to stay in the hospital to complete IV antibiotics course  Consultants:  Cardiology  ID  Orthopedic surgery  Cardiothoracic surgery  Dentistry  Antimicrobials: Anti-infectives (From admission, onward)   Start     Dose/Rate Route Frequency Ordered Stop   06/23/19 0730  ceFAZolin (ANCEF) powder 2 g     2 g Other To Surgery 06/20/19 1302 06/24/19 0730   06/19/19 2300  vancomycin (VANCOCIN) IVPB 1000 mg/200 mL premix  Status:  Discontinued     1,000 mg 200 mL/hr over 60 Minutes Intravenous Every 12 hours 06/19/19 2156 06/19/19 2159   06/19/19 2300  vancomycin (VANCOREADY) IVPB 750 mg/150 mL     750 mg 150 mL/hr over 60 Minutes Intravenous Every 12 hours 06/19/19 2159     06/19/19 2200  vancomycin (VANCOREADY) IVPB 750 mg/150 mL  Status:  Discontinued     750 mg 150 mL/hr over 60 Minutes Intravenous Every 12 hours 06/19/19 1448 06/19/19 2156   06/18/19 2200  vancomycin (VANCOREADY) IVPB 750 mg/150 mL  Status:  Discontinued     750 mg 150 mL/hr over 60 Minutes Intravenous Every 12 hours 06/18/19 1221 06/18/19 1225   06/18/19 2100  vancomycin (VANCOREADY) IVPB 750 mg/150 mL  Status:  Discontinued     750 mg 150 mL/hr over 60 Minutes Intravenous Every 12 hours 06/18/19 1225 06/19/19 1448   06/09/19 0800  vancomycin (VANCOREADY) IVPB 750 mg/150 mL  Status:  Discontinued     750 mg 150 mL/hr over 60 Minutes Intravenous Every 12 hours 06/08/19 1840 06/18/19 1221   06/03/19 1800  vancomycin (VANCOCIN) IVPB 1000 mg/200 mL premix  Status:  Discontinued     1,000 mg 200 mL/hr over 60 Minutes Intravenous Every 12 hours 06/03/19 0456 06/08/19 1841   05/31/19 0200  vancomycin (VANCOREADY) IVPB 750 mg/150 mL  Status:  Discontinued     750 mg 150 mL/hr over 60 Minutes Intravenous Every 12 hours 05/31/19 0126 06/03/19 0456    05/30/19 2200  vancomycin (VANCOREADY) IVPB 750 mg/150 mL  Status:  Discontinued     750 mg 150 mL/hr over 60 Minutes Intravenous Every 12 hours 05/30/19 1207 05/30/19 2126   05/25/19 1230  amoxicillin (AMOXIL) capsule 500 mg     500 mg Oral  Once 05/25/19 1120 05/25/19 1435   05/21/19 1000  vancomycin (VANCOCIN) IVPB 1000 mg/200 mL premix  Status:  Discontinued     1,000 mg 200 mL/hr over 60 Minutes Intravenous Every 12 hours 05/21/19 0948 05/30/19 1207   05/20/19 1400  cefTRIAXone (ROCEPHIN) 2 g in sodium chloride 0.9 % 100 mL IVPB  Status:  Discontinued     2 g 200 mL/hr over 30 Minutes Intravenous Every 24 hours 05/20/19 1328 05/22/19 0947   05/19/19 2200  levofloxacin (LEVAQUIN) IVPB 750 mg  Status:  Discontinued     750 mg 100 mL/hr over 90 Minutes Intravenous Every 24 hours 05/19/19 0212 05/19/19 0213   05/19/19 2200  levofloxacin (LEVAQUIN) IVPB 500 mg  Status:  Discontinued     500 mg 100 mL/hr over 60 Minutes Intravenous Every 24 hours 05/19/19 0213 05/19/19 0539  05/19/19 1000  vancomycin (VANCOREADY) IVPB 750 mg/150 mL  Status:  Discontinued     750 mg 150 mL/hr over 60 Minutes Intravenous Every 12 hours 05/19/19 0539 05/21/19 0948   05/19/19 0600  ceFEPIme (MAXIPIME) 2 g in sodium chloride 0.9 % 100 mL IVPB  Status:  Discontinued     2 g 200 mL/hr over 30 Minutes Intravenous Every 8 hours 05/19/19 0539 05/19/19 2348   05/19/19 0130  vancomycin (VANCOCIN) IVPB 1000 mg/200 mL premix     1,000 mg 200 mL/hr over 60 Minutes Intravenous  Once 05/19/19 0122 05/19/19 0253   05/18/19 2345  levofloxacin (LEVAQUIN) IVPB 750 mg     750 mg 100 mL/hr over 90 Minutes Intravenous  Once 05/18/19 2338 05/19/19 0140        Code Status: Full Code   Diet Order            Diet regular Room service appropriate? Yes; Fluid consistency: Thin  Diet effective now              Infusions:  . lactated ringers 10 mL/hr at 05/24/19 0902  . vancomycin 750 mg (06/21/19 1103)    Scheduled  Meds: . carvedilol  3.125 mg Oral BID WC  . [START ON 06/23/2019] ceFAZolin  2 g Other To OR  . enoxaparin (LOVENOX) injection  40 mg Subcutaneous Q24H  . ferrous sulfate  325 mg Oral BID  . gabapentin  300 mg Oral BID  . magnesium oxide  400 mg Oral BID  . methadone  20 mg Oral Daily  . nicotine  14 mg Transdermal Daily  . polyethylene glycol  17 g Oral BID  . prenatal multivitamin  1 tablet Oral Daily  . senna-docusate  1 tablet Oral BID    PRN meds: bisacodyl, hydrOXYzine, ibuprofen, ondansetron **OR** ondansetron (ZOFRAN) IV, traZODone   Objective: Vitals:   06/20/19 2135 06/21/19 0406  BP: 119/82 102/64  Pulse: (!) 104 93  Resp: 16 16  Temp: 97.8 F (36.6 C) 97.9 F (36.6 C)  SpO2: 100% 100%    Intake/Output Summary (Last 24 hours) at 06/21/2019 1748 Last data filed at 06/21/2019 1430 Gross per 24 hour  Intake 720 ml  Output --  Net 720 ml   Filed Weights   05/19/19 0400 05/24/19 0836 05/25/19 1728  Weight: 56.5 kg 56.5 kg 56.5 kg   Weight change:  Body mass index is 19.5 kg/m.   Physical Exam: General exam: Appears calm and comfortable.  Skin: No rashes, lesions or ulcers. HEENT: Atraumatic, normocephalic, supple neck, no obvious bleeding Lungs: Clear to auscultation bilaterally CVS: Regular rate and rhythm, no murmur GI/Abd soft, nontender, nondistended, bowel sound present CNS: Alert, awake, oriented x3 Psychiatry: Mood appropriate Extremities: No pedal edema, no calf tenderness  Data Review: I have personally reviewed the laboratory data and studies available.  Recent Labs  Lab 06/15/19 0742 06/19/19 1328  WBC 5.2 5.1  NEUTROABS 2.7 3.1  HGB 10.2* 10.4*  HCT 33.5* 33.8*  MCV 84.6 86.2  PLT 301 280   Recent Labs  Lab 06/15/19 0742 06/19/19 1328  NA 137 138  K 4.4 4.1  CL 102 104  CO2 25 22  GLUCOSE 86 103*  BUN 14 11  CREATININE 0.61 0.57  CALCIUM 9.3 9.1  MG 1.9 1.6*  PHOS 4.8* 4.9*    Signed, Terrilee Croak, MD [facility] Pager: [phone] (Secure Chat preferred). 06/21/2019

## 2019-06-22 DIAGNOSIS — B9562 Methicillin resistant Staphylococcus aureus infection as the cause of diseases classified elsewhere: Secondary | ICD-10-CM | POA: Diagnosis not present

## 2019-06-22 DIAGNOSIS — R7881 Bacteremia: Secondary | ICD-10-CM | POA: Diagnosis not present

## 2019-06-22 LAB — BASIC METABOLIC PANEL
Anion gap: 11 (ref 5–15)
BUN: 14 mg/dL (ref 6–20)
CO2: 22 mmol/L (ref 22–32)
Calcium: 9.5 mg/dL (ref 8.9–10.3)
Chloride: 106 mmol/L (ref 98–111)
Creatinine, Ser: 0.59 mg/dL (ref 0.44–1.00)
GFR calc Af Amer: >60 mL/min (ref >60)
GFR calc non Af Amer: >60 mL/min (ref >60)
Glucose, Bld: 87 mg/dL (ref 70–99)
Potassium: 4.3 mmol/L (ref 3.5–5.1)
Sodium: 139 mmol/L (ref 135–145)

## 2019-06-22 NOTE — Progress Notes (Signed)
PROGRESS NOTE  Massie Bougie  DOB: 24-Mar-1991  PCP: Patient, No Pcp Per QHU:[id number]  DOA: 05/18/2019 Admitted From: Home  LOS: 34 days   Chief Complaint  Patient presents with  . Fever  . Back Pain   Brief narrative: The patient is a 29 year old Caucasian female with PMH of polysubstance abuse including IV heroin, crystal meth, history of hepatitis C.  She presented to the ED on 1/28 with complaint of not feeling well for 2 weeks with fever, chest pain, cough,\flank pain and weakness.  She had a fever of 103.7.  WBC count normal. CT scan showed bilateral cavitary nodules, pneumonia concerning for septic emboli. Blood cultures sent on admission came positive for MRSA. TEE noted tricuspid valve endocarditis. She was also noted to have bilateral shoulder and spine infection.   2/4, she underwent arthroscopy, irrigation, debridement of both shoulder joints by orthopedic surgery. ID recommended 6 weeks of IV vancomycin.  PICC line placed on 2/14.  Patient will complete the course of IV vancomycin on 06/30/2019.   She is to remain in the hospital to complete the course of IV vancomycin for MRSA bacteremia given that she is an IV drug user requires supervision.    Her hospital course has also been notable for a blood transfusion and IV iron secondary to anemia of chronic disease.  She also had a UTI which was treated and a PICC line was placed for long-term IV antibiotics on 06/04/2019. 3/1, patient was seen by dentist Dr. Kristin Bruins to evaluate poor dentition as a possible source of infection for this patient. She is planned for extraction of multiple teeth tentatively on Friday 3/5.  Subjective: Patient was seen and examined this morning.  Lying down in the bed.  She is again asking me today if she will get enough pain medicines after the dental extraction surgery on Friday.  She said she would not agree to go to surgery if not promised early a lot of time that she will have adequate  pain medicine.    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 -2/4, she underwent arthroscopy, irrigation, debridement of both shoulder joints by orthopedic surgery. -ID recommended 6 weeks of IV vancomycin. PICC line placed on 2/14.  -Patient will complete the course of IV vancomycin on 06/30/2019.   -She is to remain in the hospital to complete the course of IV vancomycin for MRSA bacteremia given that she is an IV drug user requires supervision.    History of IV drug abuse -Currently pain controlled on methadone 20 mg daily, gabapentin 300 mg twice daily, ibuprofen as needed every 6 hours.  Anemia of chronic disease -Status post 1 unit PRBC transfusion as well as IV iron therapy.   -Continue ferrous sulfate.  Insomnia/sleep disturbance -Switched back to Trazodone 50 mg p.o. nightly as needed  E. Coli UTI - POA -Completed course of IV Rocephin.  Leg Cramps -Continue with Gabapentin 300 mg po BID  Hypomagnesemia/hypophosphatemia -Improved with replacement.  Poor dental hygiene  multiple dental caries, periapical abscess -Dr. Kristin Bruins is reviewed her orthopantogram and feels that the patient needs multiple extractions and requested preoperative clearance to see if she was safe for general anesthesia -3/1, patient was seen by dentist Dr. Kristin Bruins to evaluate poor dentition as a possible source of infection for this patient. -She is planned for extraction of multiple teeth  tentatively on Friday 3/5.  Tobacco Abuse -Patient states that she smokes 1 pack/day -We will start nicotine patch 14 mg transdermal daily -Smoking cessation counseling given  DVT prophylaxis:  At the request of the surgeon, I stopped Lovenox today.  Will resume it after 1 to 2 days of  surgery. Antimicrobials:  IV vancomycin Fluid: None Diet: Regular diet  Code Status:  Full code Mobility: Encourage ambulation Family Communication:  None at bedside Discharge plan:  Anticipated date: IV vancomycin till 3/12 Disposition: Home Barriers: Need to stay in the hospital to complete IV antibiotics course  Consultants:  Cardiology  ID  Orthopedic surgery  Cardiothoracic surgery  Dentistry  Antimicrobials: Anti-infectives (From admission, onward)   Start     Dose/Rate Route Frequency Ordered Stop   06/23/19 0730  ceFAZolin (ANCEF) powder 2 g     2 g Other To Surgery 06/20/19 1302 06/24/19 0730   06/19/19 2300  vancomycin (VANCOCIN) IVPB 1000 mg/200 mL premix  Status:  Discontinued     1,000 mg 200 mL/hr over 60 Minutes Intravenous Every 12 hours 06/19/19 2156 06/19/19 2159   06/19/19 2300  vancomycin (VANCOREADY) IVPB 750 mg/150 mL     750 mg 150 mL/hr over 60 Minutes Intravenous Every 12 hours 06/19/19 2159     06/19/19 2200  vancomycin (VANCOREADY) IVPB 750 mg/150 mL  Status:  Discontinued     750 mg 150 mL/hr over 60 Minutes Intravenous Every 12 hours 06/19/19 1448 06/19/19 2156   06/18/19 2200  vancomycin (VANCOREADY) IVPB 750 mg/150 mL  Status:  Discontinued     750 mg 150 mL/hr over 60 Minutes Intravenous Every 12 hours 06/18/19 1221 06/18/19 1225   06/18/19 2100  vancomycin (VANCOREADY) IVPB 750 mg/150 mL  Status:  Discontinued     750 mg 150 mL/hr over 60 Minutes Intravenous Every 12 hours 06/18/19 1225 06/19/19 1448   06/09/19 0800  vancomycin (VANCOREADY) IVPB 750 mg/150 mL  Status:  Discontinued     750 mg 150 mL/hr over 60 Minutes Intravenous Every 12 hours 06/08/19 1840 06/18/19 1221   06/03/19 1800  vancomycin (VANCOCIN) IVPB 1000 mg/200 mL premix  Status:  Discontinued     1,000 mg 200 mL/hr over 60 Minutes Intravenous Every 12 hours 06/03/19 0456 06/08/19 1841   05/31/19 0200  vancomycin (VANCOREADY) IVPB 750 mg/150 mL  Status:  Discontinued      750 mg 150 mL/hr over 60 Minutes Intravenous Every 12 hours 05/31/19 0126 06/03/19 0456   05/30/19 2200  vancomycin (VANCOREADY) IVPB 750 mg/150 mL  Status:  Discontinued     750 mg 150 mL/hr over 60 Minutes Intravenous Every 12 hours 05/30/19 1207 05/30/19 2126   05/25/19 1230  amoxicillin (AMOXIL) capsule 500 mg     500 mg Oral  Once 05/25/19 1120 05/25/19 1435   05/21/19 1000  vancomycin (VANCOCIN) IVPB 1000 mg/200 mL premix  Status:  Discontinued     1,000 mg 200 mL/hr over 60 Minutes Intravenous Every 12 hours 05/21/19 0948 05/30/19 1207   05/20/19 1400  cefTRIAXone (ROCEPHIN) 2 g in sodium chloride 0.9 % 100 mL IVPB  Status:  Discontinued     2 g 200 mL/hr over 30 Minutes Intravenous Every 24 hours 05/20/19 1328 05/22/19 0947   05/19/19 2200  levofloxacin (LEVAQUIN) IVPB 750 mg  Status:  Discontinued     750 mg 100 mL/hr over 90 Minutes Intravenous Every 24 hours 05/19/19 0212 05/19/19 0213   05/19/19 2200  levofloxacin (LEVAQUIN) IVPB 500  mg  Status:  Discontinued     500 mg 100 mL/hr over 60 Minutes Intravenous Every 24 hours 05/19/19 0213 05/19/19 0539   05/19/19 1000  vancomycin (VANCOREADY) IVPB 750 mg/150 mL  Status:  Discontinued     750 mg 150 mL/hr over 60 Minutes Intravenous Every 12 hours 05/19/19 0539 05/21/19 0948   05/19/19 0600  ceFEPIme (MAXIPIME) 2 g in sodium chloride 0.9 % 100 mL IVPB  Status:  Discontinued     2 g 200 mL/hr over 30 Minutes Intravenous Every 8 hours 05/19/19 0539 05/19/19 2348   05/19/19 0130  vancomycin (VANCOCIN) IVPB 1000 mg/200 mL premix     1,000 mg 200 mL/hr over 60 Minutes Intravenous  Once 05/19/19 0122 05/19/19 0253   05/18/19 2345  levofloxacin (LEVAQUIN) IVPB 750 mg     750 mg 100 mL/hr over 90 Minutes Intravenous  Once 05/18/19 2338 05/19/19 0140        Code Status: Full Code   Diet Order            Diet NPO time specified  Diet effective midnight        Diet regular Room service appropriate? Yes; Fluid consistency:  Thin  Diet effective now              Infusions:  . lactated ringers 10 mL/hr at 05/24/19 0902  . vancomycin 750 mg (06/22/19 1039)    Scheduled Meds: . carvedilol  3.125 mg Oral BID WC  . [START ON 06/23/2019] ceFAZolin  2 g Other To OR  . ferrous sulfate  325 mg Oral BID  . gabapentin  300 mg Oral BID  . magnesium oxide  400 mg Oral BID  . methadone  20 mg Oral Daily  . nicotine  14 mg Transdermal Daily  . polyethylene glycol  17 g Oral BID  . prenatal multivitamin  1 tablet Oral Daily  . senna-docusate  1 tablet Oral BID    PRN meds: bisacodyl, hydrOXYzine, ibuprofen, ondansetron **OR** ondansetron (ZOFRAN) IV, traZODone   Objective: Vitals:   06/21/19 2107 06/22/19 0547  BP: (!) 135/96 99/64  Pulse: (!) 108 90  Resp: 18 16  Temp: 98.6 F (37 C) 98.1 F (36.7 C)  SpO2: 100% 99%    Intake/Output Summary (Last 24 hours) at 06/22/2019 1531 Last data filed at 06/22/2019 0900 Gross per 24 hour  Intake 660 ml  Output --  Net 660 ml   Filed Weights   05/19/19 0400 05/24/19 0836 05/25/19 1728  Weight: 56.5 kg 56.5 kg 56.5 kg   Weight change:  Body mass index is 19.5 kg/m.   Physical Exam: General exam: Appears calm and comfortable.  Skin: No rashes, lesions or ulcers. HEENT: Atraumatic, normocephalic, supple neck, no obvious bleeding Lungs: Clear to auscultation bilaterally CVS: Regular rate and rhythm, no murmur GI/Abd soft, nontender, nondistended, bowel sound present CNS: Alert, awake, oriented x3 Psychiatry: Anxious about pain control postsurgically. Extremities: No pedal edema, no calf tenderness  Data Review: I have personally reviewed the laboratory data and studies available.  Recent Labs  Lab 06/19/19 1328  WBC 5.1  NEUTROABS 3.1  HGB 10.4*  HCT 33.8*  MCV 86.2  PLT 280   Recent Labs  Lab 06/19/19 1328 06/22/19 1140  NA 138 139  K 4.1 4.3  CL 104 106  CO2 22 22  GLUCOSE 103* 87  BUN 11 14  CREATININE 0.57 0.59  CALCIUM 9.1 9.5   MG 1.6*  --   PHOS  4.9*  --     Signed, Terrilee Croak, MD [facility] Pager: [phone] (Secure Chat preferred). 06/22/2019

## 2019-06-22 NOTE — Progress Notes (Signed)
PRE-OPERATIVE NOTE:  06/22/2019 Christy Pearson [id number]  VITALS: BP 99/64 (BP Location: Left Arm)   Pulse 90   Temp 98.1 F (36.7 C) (Oral)   Resp 16   Ht 5' 7.01" (1.702 m)   Wt 56.5 kg   LMP 04/27/2019 (Within Days)   SpO2 99%   BMI 19.50 kg/m   Lab Results  Component Value Date   WBC 5.1 06/19/2019   HGB 10.4 (L) 06/19/2019   HCT 33.8 (L) 06/19/2019   MCV 86.2 06/19/2019   PLT 280 06/19/2019   BMET    Component Value Date/Time   NA 139 06/22/2019 1140   K 4.3 06/22/2019 1140   CL 106 06/22/2019 1140   CO2 22 06/22/2019 1140   GLUCOSE 87 06/22/2019 1140   BUN 14 06/22/2019 1140   CREATININE 0.59 06/22/2019 1140   CALCIUM 9.5 06/22/2019 1140   GFRNONAA >60 06/22/2019 1140   GFRAA >60 06/22/2019 1140    Lab Results  Component Value Date   INR 1.3 (H) 05/20/2019   INR 1.3 (H) 05/18/2019   No results found for: PTT   Christy Pearson is planned for multiple dental extractions with alveoloplasty and gross debridement remaining dentition the operating room tomorrow morning at 7:30 AM.  Patient is aware of the probable teeth that will be extracted at this time but that additional teeth may be extracted upon further evaluation in the operating room under general anesthesia after a more thorough examination.  Patient is aware that she will need to follow-up with a dentist of her choice as an outpatient for evaluation for continued dental care, continued periodontal therapy, and evaluation for replacing the missing teeth as indicated.  SUBJECTIVE: The patient denies any acute medical or dental changes and agrees to proceed with treatment as planned.  EXAM: No sign of acute dental changes.  ASSESSMENT: Patient is affected by chronic apical periodontitis, multiple retained root segments, dental caries, chronic periodontitis, and accretions.  PLAN: Patient agrees to proceed with treatment as planned in the operating room as previously discussed and accepts the risks,  benefits, and complications of the proposed treatment. Patient is aware of the risk for bleeding, bruising, swelling, infection, pain, nerve damage, soft tissue damage, damage to adjacent teeth , sinus involvement, root tip fracture, mandible fracture, and the risks of complications associated with the anesthesia. Patient also is aware of the potential for other complications not mentioned above.     Charlynne Pander, DDS

## 2019-06-22 NOTE — Progress Notes (Signed)
     [address]       [name] [address]             [phone]        Images and chart reveiwed No blood cultures since February Echo shows severe TR and small vegetation  Vegetation is too small, and likely some component of valve degeneration.  Once all source control, and abx treatment has been completed, if her hospital blood cultures remain negative, will follow-up as an outpatient for random urine tox screens.  If negative, will discuss valve replacement.  Christy Pearson

## 2019-06-22 NOTE — Progress Notes (Signed)
Pharmacy Antibiotic Note  Christy Pearson is a 29 y.o. female admitted on 05/18/2019 with severe sepsis. Pharmacy consulted on 05/19/19 for Vancomycin dosing. 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 S/p bilateral shoulder arthroscopy, I & D on 05/25/19.    Most recent Vanc levels checked on 06/19/19.   AUC 417 was therapeutic on 750 mg IV q12hr.  Goal AUC 400-550.  Afebrile, WBC WNL, SCr is 0.59 stable.   Plan: Continue Vancomycin 750 mg IV q12h Planning 6 weeks of IV antibiotics, thru 07/09/19 Bmet q72hrs Vanc levels weekly.  Height: 5' 7.01" (170.2 cm) Weight: 124 lb 9 oz (56.5 kg) IBW/kg (Calculated) : 61.62  Temp (24hrs), Avg:98.5 F (36.9 C), Min:98.1 F (36.7 C), Max:98.7 F (37.1 C)  Recent Labs  Lab 06/19/19 1328 06/19/19 2058 06/22/19 1140  WBC 5.1  --   --   CREATININE 0.57  --  0.59  VANCOTROUGH  --  9*  --   VANCOPEAK 23*  --   --     Estimated Creatinine Clearance: 95 mL/min (by C-G formula based on SCr of 0.59 mg/dL).    Allergies  Allergen Reactions  . Tylenol [Acetaminophen] Rash    Pt just reported tylenol allergy at this visit at 2338pm    Antimicrobials this admission: Vancomycin 1/29 >>(3/12) Ceftriaxone 1/30>>2/1 for UTI Cefepime 1/29 x3 doses -2/4: Amoxicillin X 1 to document tolerance (Patient has taken in the past with no issues)  Dose adjustments this admission: 2/2: VP 29, VT 9, AUC 442 - continue  2/8: VP 37, VT 16, AUC 638 - dec to 750mg  q12h (expected AUC 478). 2/12 VP = 23 (dose given later than usual d/t IV access issue), VT 8 > AUC 349 - on 750 mg IV q12h > incr 1gm q12 2/18 VP = 36, VT = 14, AUC 651>dec 750q12 2/22  VP 27, VT 10,  AUC 404 - continue 750mg  q12h 2/28: pt refused AM vanc dose. Allowed dose to be given late. Future doses retimed  and vanc levels ordered for tomorrow 3/1: VP 23 (2.5 hrs p/ dose): VT 9, AUC 417 - cont 750 q12h  Microbiology results: 1/28 Urine: E coli 1/28 Blood x 2: MRSA 1/29 Blood x 3: negative 1/29 resp panel - negative 2/4 R shoulder fluid  - MRSA 2/4 L shoulder fluid - negative   Thank you for allowing pharmacy to be a part of this patient's care.  2/29, RPh Clinical Pharmacist [phone] Please check AMION for all [facility] Pharmacy phone numbers After 10:00 PM, call Main Pharmacy [phone] 06/22/2019 3:19 PM

## 2019-06-22 NOTE — Progress Notes (Signed)
Occupational Therapy Treatment Patient Details Name: Christy Pearson MRN: [id number] DOB: 12-29-1990 Today's Date: 06/22/2019    History of present illness Pt is a 29 y/o female admitted with fevere chills and back pain. 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 PMH hepatitis C, Kidney stones, IV drug abuse,    OT comments  Patient agreeable to OT session, reports increased soreness in L shoulder today and therefore only agreeable to completing exercises for R shoulder during session (wanting to rest L). Reports completing exercises 2x yesterday. Patient demonstrates ability to complete exercises list below with R UE given supervision, no compensatory techniques noted today. Reinforced education on ROM vs use of theraband/weights.  Pt highly anxious today (dental surgery planned tomorrow), used therapeutic use of self to discuss stressors and strategies to decrease anxiety-- provided with apps to try meditation.  Will follow acutely to progress BUE ROM and relaxation techniques.     Follow Up Recommendations  Outpatient OT;Supervision - Intermittent    Equipment Recommendations  None recommended by OT    Recommendations for Other Services      Precautions / Restrictions Precautions Precautions: None Precaution Comments: Contact Precautions Restrictions RUE Weight Bearing: Weight bearing as tolerated LUE Weight Bearing: Weight bearing as tolerated       Mobility Bed Mobility Overal bed mobility: Independent                Transfers Overall transfer level: Independent                    Balance Overall balance assessment: No apparent balance deficits (not formally assessed)                                         ADL either performed or assessed with clinical judgement   ADL                                          General ADL Comments: focused on UE ROM      Vision       Perception     Praxis      Cognition Arousal/Alertness: Awake/alert Behavior During Therapy: WFL for tasks assessed/performed Overall Cognitive Status: Within Functional Limits for tasks assessed                                          Exercises Other Exercises Other Exercises: Reviewed exercises with pt to ensure proper completion without compensation, completing with R UE only today (reports L shoudler is "too sore"), AAROM 45* angled slides on elevated HOB x 10, wall slides flexion and scaption x 10 each using washcloth.     Shoulder Instructions       General Comments Patient reports increased anxiety and panic attacks during hospital stay, educated on relaxation techniques of deep breathing and meditation.  Patient questioned use of yoga with mats, therapist discouraged at this time for safety; but when brought up meditation patient reports "thats what I meant!". Provided apps patient could try to assist with guided meditation including headspace and the calm app.  Pertinent Vitals/ Pain       Pain Assessment: Faces Faces Pain Scale: Hurts a little bit Pain Location: R shoulder Pain Descriptors / Indicators: Sore Pain Intervention(s): Monitored during session  Home Living                                          Prior Functioning/Environment              Frequency  Min 3X/week        Progress Toward Goals  OT Goals(current goals can now be found in the care plan section)  Progress towards OT goals: Progressing toward goals  Acute Rehab OT Goals Patient Stated Goal: to improve arm movement and strength OT Goal Formulation: With patient  Plan Discharge plan remains appropriate;Frequency remains appropriate    Co-evaluation                 AM-PAC OT "6 Clicks" Daily Activity     Outcome Measure   Help  from another person eating meals?: None Help from another person taking care of personal grooming?: None Help from another person toileting, which includes using toliet, bedpan, or urinal?: None Help from another person bathing (including washing, rinsing, drying)?: None Help from another person to put on and taking off regular upper body clothing?: None Help from another person to put on and taking off regular lower body clothing?: None 6 Click Score: 24    End of Session    OT Visit Diagnosis: Muscle weakness (generalized) (M62.81) Pain - Right/Left: Right Pain - part of body: Shoulder   Activity Tolerance Patient tolerated treatment well   Patient Left in chair   Nurse Communication Mobility status        Time: 2725-3664 OT Time Calculation (min): 35 min  Charges: OT General Charges $OT Visit: 1 Visit OT Treatments $Therapeutic Activity: 8-22 mins $Therapeutic Exercise: 8-22 mins  Barry Brunner, OT Acute Rehabilitation Services Pager [phone] Office [phone]    Chancy Milroy 06/22/2019, 2:41 PM

## 2019-06-22 NOTE — Progress Notes (Signed)
     Subjective: 28 Days Post-Op s/p Procedure(s): ARTHROSCOPY SHOULDER    Patient is alert, oriented, sitting on side of bed. Reports pain in shoulders to be mild. Denies pain in left wrist.   Objective:  PE: VITALS:   Vitals:   06/21/19 0406 06/21/19 1754 06/21/19 2107 06/22/19 0547  BP: 102/64 114/79 (!) 135/96 99/64  Pulse: 93 99 (!) 108 90  Resp: 16  18 16   Temp: 97.9 F (36.6 C) 98.7 F (37.1 C) 98.6 F (37 C) 98.1 F (36.7 C)  TempSrc: Oral Oral Oral Oral  SpO2: 100% 100% 100% 99%  Weight:      Height:       General: Alert, oriented, in no acute distress Resp: No use of accessory musculature MSK: No TTP to bilateral shoulders. Patient about to perform forward flexion 90 degrees bilaterally. Distal sensation and capillary refill intact. No Erythema or edema noted on either shoulder. Arthroscopic incision sites fully healed. No TTP to left wrist. Full ROM left wrist.   LABS  Results for orders placed or performed during the hospital encounter of 05/18/19 (from the past 24 hour(s))  Basic metabolic panel     Status: None   Collection Time: 06/22/19 11:40 AM  Result Value Ref Range   Sodium 139 135 - 145 mmol/L   Potassium 4.3 3.5 - 5.1 mmol/L   Chloride 106 98 - 111 mmol/L   CO2 22 22 - 32 mmol/L   Glucose, Bld 87 70 - 99 mg/dL   BUN 14 6 - 20 mg/dL   Creatinine, Ser 08/22/19 0.44 - 1.00 mg/dL   Calcium 9.5 8.9 - 1.95 mg/dL   GFR calc non Af Amer >60 >60 mL/min   GFR calc Af Amer >60 >60 mL/min   Anion gap 11 5 - 15    No results found.  Assessment/Plan: Principal Problem:   MRSA bacteremia Active Problems:   Injection of illicit drug within last 12 months   Hepatitis C antibody positive, s/p spontaneously cleared infection   Sepsis (HCC)   Septic pulmonary embolism (HCC)   Substance use disorder   Suspected endocarditis   IV drug abuse (HCC)   Septic embolism (HCC)   Staphylococcal arthritis of right shoulder (HCC)   Staphylococcal arthritis of left  wrist (HCC)   PFO (patent foramen ovale)   Endocarditis of tricuspid valve   Septic arthritis of shoulder, left (HCC)   Septic arthritis of shoulder, right (HCC)   Elevated LFTs    28 Days Post-Op s/p Procedure(s): ARTHROSCOPY SHOULDER  Weightbearing:WBAT bilateral upper extremities.Patient to continue to work on ROM of bilateral shoulders Insicional and dressing care:None. Showering:Ok to shower VTE prophylaxis:Lovenox daily Follow - up plan: Patient to follow-up with Dr. 09.3 2 weeks after discharge.  Plannedcontinued stay in hospital until March 12due to needforinpatient antibiotic treatment. Will continue to follow her progress intermittently during her hospital stay.  Contact information:   Weekdays 8-5 12-22-2005, Janine Ores [place] A fter hours and holidays please check [email or web address] for group call information for [facility]  [phone] 06/22/2019, 2:17 PM

## 2019-06-22 NOTE — Anesthesia Preprocedure Evaluation (Addendum)
Anesthesia Evaluation  Patient identified by MRN, date of birth, ID band Patient awake    Reviewed: Allergy & Precautions, H&P , NPO status , Patient's Chart, lab work & pertinent test results  Airway Mallampati: III  TM Distance: >3 FB Neck ROM: Full  Mouth opening: Limited Mouth Opening Comment: Limited mouth opening since her facial trauma 1.5years ago Dental no notable dental hx. (+) Poor Dentition, Dental Advisory Given   Pulmonary Current Smoker and Patient abstained from smoking.,  5 pack year history, has nicotine patch currently. Last cigarette 1 month ago   Pulmonary exam normal breath sounds clear to auscultation       Cardiovascular negative cardio ROS   Rhythm:Regular Rate:Normal     Neuro/Psych negative neurological ROS  negative psych ROS   GI/Hepatic negative GI ROS, (+)     substance abuse  IV drug use, Hepatitis -, CHx endocarditis this admission  On methadone for hx drug abuse  Last drug use 1.85mo ago (prior to admission)- crystal meth and heroin   Endo/Other  negative endocrine ROS  Renal/GU negative Renal ROS  negative genitourinary   Musculoskeletal  (+) Arthritis , Osteoarthritis,    Abdominal Normal abdominal exam  (+)   Peds  Hematology  (+) Blood dyscrasia, anemia ,   Anesthesia Other Findings Endocarditis, acute pulpitis  Reproductive/Obstetrics negative OB ROS                           Anesthesia Physical Anesthesia Plan  ASA: III  Anesthesia Plan: General   Post-op Pain Management:    Induction: Intravenous  PONV Risk Score and Plan: 2 and Ondansetron, Dexamethasone, Treatment may vary due to age or medical condition and Midazolam  Airway Management Planned: Nasal ETT  Additional Equipment: None  Intra-op Plan:   Post-operative Plan: Extubation in OR  Informed Consent: I have reviewed the patients History and Physical, chart, labs and  discussed the procedure including the risks, benefits and alternatives for the proposed anesthesia with the patient or authorized representative who has indicated his/her understanding and acceptance.     Dental advisory given  Plan Discussed with: CRNA  Anesthesia Plan Comments:         Anesthesia Quick Evaluation

## 2019-06-23 ENCOUNTER — Inpatient Hospital Stay (HOSPITAL_COMMUNITY): Payer: Self-pay | Admitting: Certified Registered Nurse Anesthetist

## 2019-06-23 ENCOUNTER — Encounter (HOSPITAL_COMMUNITY): Payer: Self-pay | Admitting: Emergency Medicine

## 2019-06-23 ENCOUNTER — Encounter (HOSPITAL_COMMUNITY)
Admission: EM | Disposition: A | Discharge status: Home / Self Care | Payer: Self-pay | Source: Home / Self Care | Attending: Internal Medicine

## 2019-06-23 DIAGNOSIS — R7881 Bacteremia: Secondary | ICD-10-CM | POA: Diagnosis not present

## 2019-06-23 DIAGNOSIS — B9562 Methicillin resistant Staphylococcus aureus infection as the cause of diseases classified elsewhere: Secondary | ICD-10-CM | POA: Diagnosis not present

## 2019-06-23 HISTORY — PX: MULTIPLE EXTRACTIONS WITH ALVEOLOPLASTY: SHX5342

## 2019-06-23 SURGERY — MULTIPLE EXTRACTION WITH ALVEOLOPLASTY
Anesthesia: General | Site: Mouth

## 2019-06-23 MED ORDER — PROPOFOL 10 MG/ML IV BOLUS
INTRAVENOUS | Status: AC
  Filled 2019-06-23: qty 20

## 2019-06-23 MED ORDER — STERILE WATER FOR IRRIGATION IR SOLN
Status: DC | PRN
  Administered 2019-06-23: 1000 mL

## 2019-06-23 MED ORDER — CEFAZOLIN SODIUM-DEXTROSE 2-3 GM-%(50ML) IV SOLR
INTRAVENOUS | Status: DC | PRN
  Administered 2019-06-23: 2 g via INTRAVENOUS

## 2019-06-23 MED ORDER — BUPIVACAINE-EPINEPHRINE (PF) 0.5% -1:200000 IJ SOLN
INTRAMUSCULAR | Status: AC
  Filled 2019-06-23: qty 3.6

## 2019-06-23 MED ORDER — HYDROMORPHONE HCL 1 MG/ML IJ SOLN: 0.5000 mg | INTRAMUSCULAR | Status: DC | PRN

## 2019-06-23 MED ORDER — FENTANYL CITRATE (PF) 250 MCG/5ML IJ SOLN
INTRAMUSCULAR | Status: AC
  Filled 2019-06-23: qty 5

## 2019-06-23 MED ORDER — 0.9 % SODIUM CHLORIDE (POUR BTL) OPTIME
TOPICAL | Status: DC | PRN
  Administered 2019-06-23: 1000 mL

## 2019-06-23 MED ORDER — SUCCINYLCHOLINE CHLORIDE 200 MG/10ML IV SOSY
PREFILLED_SYRINGE | INTRAVENOUS | Status: AC
  Filled 2019-06-23: qty 10

## 2019-06-23 MED ORDER — PROPOFOL 10 MG/ML IV BOLUS
INTRAVENOUS | Status: DC | PRN
  Administered 2019-06-23: 20 mg via INTRAVENOUS
  Administered 2019-06-23: 200 mg via INTRAVENOUS
  Administered 2019-06-23: 20 mg via INTRAVENOUS

## 2019-06-23 MED ORDER — DEXAMETHASONE SODIUM PHOSPHATE 10 MG/ML IJ SOLN
INTRAMUSCULAR | Status: AC
  Filled 2019-06-23: qty 1

## 2019-06-23 MED ORDER — FENTANYL CITRATE (PF) 250 MCG/5ML IJ SOLN
INTRAMUSCULAR | Status: DC | PRN
  Administered 2019-06-23 (×5): 50 ug via INTRAVENOUS
  Administered 2019-06-23: 100 ug via INTRAVENOUS

## 2019-06-23 MED ORDER — ROCURONIUM BROMIDE 10 MG/ML (PF) SYRINGE
PREFILLED_SYRINGE | INTRAVENOUS | Status: AC
  Filled 2019-06-23: qty 20

## 2019-06-23 MED ORDER — ONDANSETRON HCL 4 MG/2ML IJ SOLN
INTRAMUSCULAR | Status: DC | PRN
  Administered 2019-06-23: 4 mg via INTRAVENOUS

## 2019-06-23 MED ORDER — PHENYLEPHRINE 40 MCG/ML (10ML) SYRINGE FOR IV PUSH (FOR BLOOD PRESSURE SUPPORT)
PREFILLED_SYRINGE | INTRAVENOUS | Status: AC
  Filled 2019-06-23: qty 10

## 2019-06-23 MED ORDER — HYDROMORPHONE HCL 1 MG/ML IJ SOLN
0.5000 mg | INTRAMUSCULAR | Status: DC | PRN
  Administered 2019-06-23 – 2019-06-25 (×4): 0.5 mg via INTRAVENOUS
  Filled 2019-06-23 (×5): qty 1

## 2019-06-23 MED ORDER — BUPIVACAINE-EPINEPHRINE 0.5% -1:200000 IJ SOLN
INTRAMUSCULAR | Status: DC | PRN
  Administered 2019-06-23 (×2): 1.8 mL

## 2019-06-23 MED ORDER — OXYMETAZOLINE HCL 0.05 % NA SOLN
NASAL | Status: AC
  Filled 2019-06-23: qty 30

## 2019-06-23 MED ORDER — ARTIFICIAL TEARS OPHTHALMIC OINT
TOPICAL_OINTMENT | OPHTHALMIC | Status: AC
  Filled 2019-06-23: qty 3.5

## 2019-06-23 MED ORDER — OXYMETAZOLINE HCL 0.05 % NA SOLN
NASAL | Status: DC | PRN
  Administered 2019-06-23 (×2): 2 via NASAL

## 2019-06-23 MED ORDER — PHENYLEPHRINE 40 MCG/ML (10ML) SYRINGE FOR IV PUSH (FOR BLOOD PRESSURE SUPPORT)
PREFILLED_SYRINGE | INTRAVENOUS | Status: DC | PRN
  Administered 2019-06-23 (×2): 120 ug via INTRAVENOUS
  Administered 2019-06-23 (×2): 80 ug via INTRAVENOUS

## 2019-06-23 MED ORDER — ROCURONIUM BROMIDE 10 MG/ML (PF) SYRINGE
PREFILLED_SYRINGE | INTRAVENOUS | Status: DC | PRN
  Administered 2019-06-23: 20 mg via INTRAVENOUS
  Administered 2019-06-23: 10 mg via INTRAVENOUS
  Administered 2019-06-23: 50 mg via INTRAVENOUS

## 2019-06-23 MED ORDER — HEMOSTATIC AGENTS (NO CHARGE) OPTIME
TOPICAL | Status: DC | PRN
  Administered 2019-06-23: 1

## 2019-06-23 MED ORDER — LACTATED RINGERS IV SOLN: INTRAVENOUS | Status: DC

## 2019-06-23 MED ORDER — ONDANSETRON HCL 4 MG/2ML IJ SOLN
INTRAMUSCULAR | Status: AC
  Filled 2019-06-23: qty 2

## 2019-06-23 MED ORDER — LIDOCAINE 2% (20 MG/ML) 5 ML SYRINGE
INTRAMUSCULAR | Status: DC | PRN
  Administered 2019-06-23: 60 mg via INTRAVENOUS

## 2019-06-23 MED ORDER — MIDAZOLAM HCL 5 MG/5ML IJ SOLN
INTRAMUSCULAR | Status: DC | PRN
  Administered 2019-06-23: 2 mg via INTRAVENOUS

## 2019-06-23 MED ORDER — BOOST / RESOURCE BREEZE PO LIQD CUSTOM
1.0000 | Freq: Three times a day (TID) | ORAL | Status: DC
  Administered 2019-06-24: 1 via ORAL

## 2019-06-23 MED ORDER — LIDOCAINE 2% (20 MG/ML) 5 ML SYRINGE
INTRAMUSCULAR | Status: AC
  Filled 2019-06-23: qty 5

## 2019-06-23 MED ORDER — HYDROMORPHONE HCL 1 MG/ML IJ SOLN
2.0000 mg | INTRAMUSCULAR | Status: DC | PRN
  Administered 2019-06-23: 2 mg via INTRAVENOUS
  Filled 2019-06-23: qty 2

## 2019-06-23 MED ORDER — MIDAZOLAM HCL 2 MG/2ML IJ SOLN
INTRAMUSCULAR | Status: AC
  Filled 2019-06-23: qty 2

## 2019-06-23 MED ORDER — KETOROLAC TROMETHAMINE 30 MG/ML IJ SOLN
INTRAMUSCULAR | Status: AC
  Filled 2019-06-23: qty 1

## 2019-06-23 MED ORDER — EPHEDRINE 5 MG/ML INJ
INTRAVENOUS | Status: AC
  Filled 2019-06-23: qty 10

## 2019-06-23 MED ORDER — PHENYLEPHRINE HCL-NACL 10-0.9 MG/250ML-% IV SOLN
INTRAVENOUS | Status: DC | PRN
  Administered 2019-06-23: 25 ug/min via INTRAVENOUS

## 2019-06-23 MED ORDER — KETOROLAC TROMETHAMINE 30 MG/ML IJ SOLN
INTRAMUSCULAR | Status: DC | PRN
  Administered 2019-06-23: 30 mg via INTRAVENOUS

## 2019-06-23 MED ORDER — DEXAMETHASONE SODIUM PHOSPHATE 10 MG/ML IJ SOLN
INTRAMUSCULAR | Status: DC | PRN
  Administered 2019-06-23: 4 mg via INTRAVENOUS

## 2019-06-23 MED ORDER — DEXMEDETOMIDINE HCL 200 MCG/2ML IV SOLN
INTRAVENOUS | Status: DC | PRN
  Administered 2019-06-23: 8 ug via INTRAVENOUS
  Administered 2019-06-23 (×4): 4 ug via INTRAVENOUS
  Administered 2019-06-23: 8 ug via INTRAVENOUS
  Administered 2019-06-23 (×2): 4 ug via INTRAVENOUS

## 2019-06-23 MED ORDER — LIDOCAINE-EPINEPHRINE 2 %-1:100000 IJ SOLN
INTRAMUSCULAR | Status: DC | PRN
  Administered 2019-06-23 (×5): 1.7 mL

## 2019-06-23 MED ORDER — IBUPROFEN 600 MG PO TABS
600.0000 mg | ORAL_TABLET | Freq: Four times a day (QID) | ORAL | Status: DC | PRN
  Administered 2019-06-23 – 2019-06-30 (×13): 600 mg via ORAL
  Filled 2019-06-23 (×14): qty 1

## 2019-06-23 SURGICAL SUPPLY — 41 items
ALCOHOL 70% 16 OZ (MISCELLANEOUS) ×3 IMPLANT
ATTRACTOMAT 16X20 MAGNETIC DRP (DRAPES) ×3 IMPLANT
BANDAGE HEMOSTAT MRDH 4X4 STRL (MISCELLANEOUS) IMPLANT
BLADE SURG 15 STRL LF DISP TIS (BLADE) ×2 IMPLANT
BLADE SURG 15 STRL SS (BLADE) ×4
BNDG HEMOSTAT MRDH 4X4 STRL (MISCELLANEOUS)
COVER SURGICAL LIGHT HANDLE (MISCELLANEOUS) ×3 IMPLANT
COVER WAND RF STERILE (DRAPES) IMPLANT
GAUZE 4X4 16PLY RFD (DISPOSABLE) ×3 IMPLANT
GAUZE PACKING FOLDED 2  STR (GAUZE/BANDAGES/DRESSINGS) ×2
GAUZE PACKING FOLDED 2 STR (GAUZE/BANDAGES/DRESSINGS) ×1 IMPLANT
GAUZE SPONGE 4X4 12PLY STRL LF (GAUZE/BANDAGES/DRESSINGS) ×3 IMPLANT
GLOVE BIO SURGEON STRL SZ 6.5 (GLOVE) ×2 IMPLANT
GLOVE BIO SURGEONS STRL SZ 6.5 (GLOVE) ×1
GLOVE SURG ORTHO 8.0 STRL STRW (GLOVE) ×3 IMPLANT
GOWN STRL REUS W/ TWL LRG LVL3 (GOWN DISPOSABLE) ×1 IMPLANT
GOWN STRL REUS W/TWL 2XL LVL3 (GOWN DISPOSABLE) ×3 IMPLANT
GOWN STRL REUS W/TWL LRG LVL3 (GOWN DISPOSABLE) ×2
HEMOSTAT SURGICEL 2X14 (HEMOSTASIS) IMPLANT
KIT BASIN OR (CUSTOM PROCEDURE TRAY) ×3 IMPLANT
KIT TURNOVER KIT B (KITS) ×3 IMPLANT
MANIFOLD NEPTUNE II (INSTRUMENTS) ×3 IMPLANT
NEEDLE BLUNT 16X1.5 OR ONLY (NEEDLE) ×3 IMPLANT
NEEDLE DENTAL 27 LONG (NEEDLE) ×6 IMPLANT
NS IRRIG 1000ML POUR BTL (IV SOLUTION) ×3 IMPLANT
PACK EENT II TURBAN DRAPE (CUSTOM PROCEDURE TRAY) ×3 IMPLANT
PAD ARMBOARD 7.5X6 YLW CONV (MISCELLANEOUS) ×3 IMPLANT
SPONGE SURGIFOAM ABS GEL 100 (HEMOSTASIS) ×3 IMPLANT
SPONGE SURGIFOAM ABS GEL 12-7 (HEMOSTASIS) IMPLANT
SPONGE SURGIFOAM ABS GEL SZ50 (HEMOSTASIS) IMPLANT
SUCTION FRAZIER HANDLE 10FR (MISCELLANEOUS) ×4
SUCTION TUBE FRAZIER 10FR DISP (MISCELLANEOUS) ×2 IMPLANT
SUT CHROMIC 3 0 PS 2 (SUTURE) ×6 IMPLANT
SUT CHROMIC 4 0 P 3 18 (SUTURE) ×6 IMPLANT
SYR 50ML SLIP (SYRINGE) ×3 IMPLANT
TOWEL GREEN STERILE (TOWEL DISPOSABLE) ×3 IMPLANT
TUBE CONNECTING 12'X1/4 (SUCTIONS) ×2
TUBE CONNECTING 12X1/4 (SUCTIONS) ×4 IMPLANT
WATER STERILE IRR 1000ML POUR (IV SOLUTION) ×3 IMPLANT
WATER TABLETS ICX (MISCELLANEOUS) ×3 IMPLANT
YANKAUER SUCT BULB TIP NO VENT (SUCTIONS) ×3 IMPLANT

## 2019-06-23 NOTE — Progress Notes (Signed)
PRE-OPERATIVE NOTE:  06/23/2019 Christy Pearson [id number]  VITALS: BP 94/60 (BP Location: Left Arm)   Pulse 97   Temp 97.8 F (36.6 C) (Oral)   Resp 18   Ht 5' 7.01" (1.702 m)   Wt 56.5 kg   LMP 04/27/2019 (Within Days)   SpO2 99%   BMI 19.50 kg/m   Lab Results  Component Value Date   WBC 5.1 06/19/2019   HGB 10.4 (L) 06/19/2019   HCT 33.8 (L) 06/19/2019   MCV 86.2 06/19/2019   PLT 280 06/19/2019   BMET    Component Value Date/Time   NA 139 06/22/2019 1140   K 4.3 06/22/2019 1140   CL 106 06/22/2019 1140   CO2 22 06/22/2019 1140   GLUCOSE 87 06/22/2019 1140   BUN 14 06/22/2019 1140   CREATININE 0.59 06/22/2019 1140   CALCIUM 9.5 06/22/2019 1140   GFRNONAA >60 06/22/2019 1140   GFRAA >60 06/22/2019 1140    Lab Results  Component Value Date   INR 1.3 (H) 05/20/2019   INR 1.3 (H) 05/18/2019   No results found for: PTT   Christy Pearson presents for multiple dental extractions with alveoloplasty and gross debridement of remaining dentition in the operating room with general anesthesia.    SUBJECTIVE: The patient denies any acute medical or dental changes and agrees to proceed with treatment as planned.  EXAM: No sign of acute dental changes.  ASSESSMENT: Patient is affected by history of acute pulpitis, chronic apical periodontitis, multiple retained root segments, dental caries, chronic periodontitis, and accretions.  PLAN: Patient agrees to proceed with treatment as planned in the operating room as previously discussed and accepts the risks, benefits, and complications of the proposed treatment. Patient is aware of the risk for bleeding, bruising, swelling, infection, pain, nerve damage, soft tissue damage, damage to adjacent teeth, sinus involvement, root tip fracture, mandible fracture, and the risks of complications associated with the anesthesia. Patient also is aware of the potential for other complications not mentioned above.   Charlynne Pander,  DDS

## 2019-06-23 NOTE — Discharge Instructions (Signed)
MOUTH CARE AFTER SURGERY ° °FACTS: °· Ice used in ice bag helps keep the swelling down, and can help lessen the pain. °· It is easier to treat pain BEFORE it happens. °· Spitting disturbs the clot and may cause bleeding to start again, or to get worse. °· Smoking delays healing and can cause complications. °· Sharing prescriptions can be dangerous.  Do not take medications not recently prescribed for you. °· Antibiotics may stop birth control pills from working.  Use other means of birth control while on antibiotics. °· Warm salt water rinses after the first 24 hours will help lessen the swelling:  Use 1/2 teaspoonful of table salt per oz.of water. ° °DO NOT: °· Do not spit.  Do not drink through a straw. °· Strongly advised not to smoke, dip snuff or chew tobacco at least for 3 days. °· Do not eat sharp or crunchy foods.  Avoid the area of surgery when chewing. °· Do not stop your antibiotics before your instructions say to do so. °· Do not eat hot foods until bleeding has stopped.  If you need to, let your food cool down to room temperature. ° °EXPECT: °· Some swelling, especially first 2-3 days. °· Soreness or discomfort in varying degrees.  Follow your dentist's instructions about how to handle pain before it starts. °· Pinkish saliva or light blood in saliva, or on your pillow in the morning.  This can last around 24 hours. °· Bruising inside or outside the mouth.  This may not show up until 2-3 days after surgery.  Don't worry, it will go away in time. °· Pieces of "bone" may work themselves loose.  It's OK.  If they bother you, let us know. ° °WHAT TO DO IMMEDIATELY AFTER SURGERY: °· Bite on the gauze with steady pressure for 1-2 hours.  Don't chew on the gauze. °· Do not lie down flat.  Raise your head support especially for the first 24 hours. °· Apply ice to your face on the side of the surgery.  You may apply it 20 minutes on and a few minutes off.  Ice for 8-12 hours.  You may use ice up to 24  hours. °· Before the numbness wears off, take a pain pill as instructed. °· Prescription pain medication is not always required. ° °SWELLING: °· Expect swelling for the first couple of days.  It should get better after that. °· If swelling increases 3 days or so after surgery; let us know as soon as possible. ° °FEVER: °· Take Tylenol every 4 hours if needed to lower your temperature, especially if it is at 100F or higher. °· Drink lots of fluids. °· If the fever does not go away, let us know. ° °BREATHING TROUBLE: °· Any unusual difficulty breathing means you have to have someone bring you to the emergency room ASAP ° °BLEEDING: °· Light oozing is expected for 24 hours or so. °· Prop head up with pillows °· Avoid spitting °· Do not confuse bright red fresh flowing blood with lots of saliva colored with a little bit of blood. °· If you notice some bleeding, place gauze or a tea bag where it is bleeding and apply CONSTANT pressure by biting down for 1 hour.  Avoid talking during this time.  Do not remove the gauze or tea bag during this hour to "check" the bleeding. °· If you notice bright RED bleeding FLOWING out of particular area, and filling the floor of your mouth, put   a wad of gauze on that area, bite down firmly and constantly.  Call us immediately.  If we're closed, have someone bring you to the emergency room.  ORAL HYGIENE:  Brush your teeth as usual after meals and before bedtime.  Use a soft toothbrush around the area of surgery.  DO NOT AVOID BRUSHING.  Otherwise bacteria(germs) will grow and may delay healing or encourage infection.  Since you cannot spit, just gently rinse and let the water flow out of your mouth.  DO NOT SWISH HARD.  EATING:  Cool liquids are a good point to start.  Increase to soft foods as tolerated.  PRESCRIPTIONS:  Follow the directions for your prescriptions exactly as written.  If Dr. Kristin Bruins gave you a narcotic pain medication, do not drive, operate  machinery or drink alcohol when on that medication.  QUESTIONS:  Call our office during office hours [phone] or call the Emergency Room at [phone].     Diet: As you were doing prior to hospitalization   Shower:  May shower but keep the wounds dry, use an occlusive plastic wrap, NO SOAKING IN TUB.  If the bandage gets wet, change with a clean dry gauze.  If you have a splint on, leave the splint in place and keep the splint dry with a plastic bag.  Dressing:  You may change your dressing 3-5 days after surgery, unless you have a splint.  If you have a splint, then just leave the splint in place and we will change your bandages during your first follow-up appointment.    If you had hand or foot surgery, we will plan to remove your stitches in about 2 weeks in the office.  For all other surgeries, there are sticky tapes (steri-strips) on your wounds and all the stitches are absorbable.  Leave the steri-strips in place when changing your dressings, they will peel off with time, usually 2-3 weeks.  Activity:  Increase activity slowly as tolerated, but follow the weight bearing instructions below.  The rules on driving is that you can not be taking narcotics while you drive, and you must feel in control of the vehicle.    To prevent constipation: you may use a stool softener such as -  Colace (over the counter) 100 mg by mouth twice a day  Drink plenty of fluids (prune juice may be helpful) and high fiber foods Miralax (over the counter) for constipation as needed.    Itching:  If you experience itching with your medications, try taking only a single pain pill, or even half a pain pill at a time.  You may take up to 10 pain pills per day, and you can also use benadryl over the counter for itching or also to help with sleep.   Precautions:  If you experience chest pain or shortness of breath - call 911 immediately for transfer to the hospital emergency department!!  If you develop a  fever greater that 101 F, purulent drainage from wound, increased redness or drainage from wound, or calf pain -- Call the office at [phone]                                                Follow- Up Appointment:  Please call for an appointment to be seen in 2 weeks [place] - [phone]

## 2019-06-23 NOTE — Progress Notes (Signed)
PROGRESS NOTE  Christy Pearson  DOB: 1991/01/22  PCP: Patient, No Pcp Per GBT:[id number]  DOA: 05/18/2019 Admitted From: Home  LOS: 35 days   Chief Complaint  Patient presents with  . Fever  . Back Pain   Brief narrative: The patient is a 29 year old Caucasian female with PMH of polysubstance abuse including IV heroin, crystal meth, history of hepatitis C.  She presented to the ED on 1/28 with complaint of not feeling well for 2 weeks with fever, chest pain, cough,\flank pain and weakness.  She had a fever of 103.7.  WBC count normal. CT scan showed bilateral cavitary nodules, pneumonia concerning for septic emboli. Blood cultures sent on admission came positive for MRSA. TEE noted tricuspid valve endocarditis. She was also noted to have bilateral shoulder and spine infection.   2/4, she underwent arthroscopy, irrigation, debridement of both shoulder joints by orthopedic surgery. ID recommended 6 weeks of IV vancomycin.  PICC line placed on 2/14.  Patient will complete the course of IV vancomycin on 06/30/2019.   She is to remain in the hospital to complete the course of IV vancomycin for MRSA bacteremia given that she is an IV drug user requires supervision.    Her hospital course has also been notable for a blood transfusion and IV iron secondary to anemia of chronic disease.  She also had a UTI which was treated and a PICC line was placed for long-term IV antibiotics on 06/04/2019. 3/1, patient was seen by dentist Dr. Kristin Bruins to evaluate poor dentition as a possible source of infection for this patient. 3/5, patient underwent extraction of multiple teeth extraction as well as alveoloplasty.   Subjective: Patient was seen and examined this afternoon. Patient underwent extraction of multiple teeth extraction as well as alveoloplasty this morning.  She is extremely concerned about pain management.  Currently on 0.5 mg IV Dilaudid every 4 hours as needed.  Grandmother at bedside at  the time of my evaluation.  Pain controlled.  Assessment/Plan: MRSA bacteremiasecondary to IV drug use Tricuspid valve endocarditis,  multilevel vertebral osteomyelitis,  septic pulmonary emboli,  bilateral septic shoulder arthritis suspected left wrist septic arthrits Severe sepsis - POA, resolved. -2/4, she underwent arthroscopy, irrigation, debridement of both shoulder joints by orthopedic surgery. -ID recommended 6 weeks of IV vancomycin. PICC line placed on 2/14.  -Patient will complete the course of IV vancomycin on 06/30/2019.   -She is to remain in the hospital to complete the course of IV vancomycin for MRSA bacteremia given that she is an IV drug user requires supervision.    History of IV drug abuse -Currently pain controlled on methadone 20 mg daily, gabapentin 300 mg twice daily, ibuprofen as needed every 6 hours.  Anemia of chronic disease -Status post 1 unit PRBC transfusion as well as IV iron therapy.   -Continue ferrous sulfate.  Insomnia/sleep disturbance -Switched back to Trazodone 50 mg p.o. nightly as needed  E. Coli UTI - POA -Completed course of IV Rocephin.  Leg Cramps -Continue with Gabapentin 300 mg po BID  Hypomagnesemia/hypophosphatemia -Improved with replacement.  Poor dental hygiene  multiple dental caries, periapical abscess -Dr. Kristin Bruins is reviewed her orthopantogram and feels that the patient needs multiple extractions and requested preoperative clearance to see if she was safe for general anesthesia -3/1, patient was seen by dentist Dr. Kristin Bruins to evaluate poor dentition as a possible source of infection for this patient. -3/5, patient underwent extraction of multiple teeth extraction as well as alveoloplasty.   Tobacco Abuse -  Patient states that she smokes 1 pack/day -Continue nicotine patch 14 mg transdermal daily -Smoking cessation counseling given  DVT prophylaxis:  Patient apparently has been refusing to get Lovenox for last  1 month.  Continue SCDs.   Antimicrobials:  IV vancomycin  Fluid: None Diet: Regular diet      Code Status:  Full code Mobility: Encourage ambulation Family Communication:  None at bedside Discharge plan:  Anticipated date: IV vancomycin till 3/12 Disposition: Home Barriers: Need to stay in the hospital to complete IV antibiotics course  Consultants:  Cardiology  ID  Orthopedic surgery  Cardiothoracic surgery  Dentistry  Antimicrobials: Anti-infectives (From admission, onward)   Start     Dose/Rate Route Frequency Ordered Stop   06/23/19 0730  ceFAZolin (ANCEF) powder 2 g     2 g Other To Surgery 06/20/19 1302 06/24/19 0730   06/19/19 2300  vancomycin (VANCOCIN) IVPB 1000 mg/200 mL premix  Status:  Discontinued     1,000 mg 200 mL/hr over 60 Minutes Intravenous Every 12 hours 06/19/19 2156 06/19/19 2159   06/19/19 2300  vancomycin (VANCOREADY) IVPB 750 mg/150 mL     750 mg 150 mL/hr over 60 Minutes Intravenous Every 12 hours 06/19/19 2159     06/19/19 2200  vancomycin (VANCOREADY) IVPB 750 mg/150 mL  Status:  Discontinued     750 mg 150 mL/hr over 60 Minutes Intravenous Every 12 hours 06/19/19 1448 06/19/19 2156   06/18/19 2200  vancomycin (VANCOREADY) IVPB 750 mg/150 mL  Status:  Discontinued     750 mg 150 mL/hr over 60 Minutes Intravenous Every 12 hours 06/18/19 1221 06/18/19 1225   06/18/19 2100  vancomycin (VANCOREADY) IVPB 750 mg/150 mL  Status:  Discontinued     750 mg 150 mL/hr over 60 Minutes Intravenous Every 12 hours 06/18/19 1225 06/19/19 1448   06/09/19 0800  vancomycin (VANCOREADY) IVPB 750 mg/150 mL  Status:  Discontinued     750 mg 150 mL/hr over 60 Minutes Intravenous Every 12 hours 06/08/19 1840 06/18/19 1221   06/03/19 1800  vancomycin (VANCOCIN) IVPB 1000 mg/200 mL premix  Status:  Discontinued     1,000 mg 200 mL/hr over 60 Minutes Intravenous Every 12 hours 06/03/19 0456 06/08/19 1841   05/31/19 0200  vancomycin (VANCOREADY) IVPB 750 mg/150 mL   Status:  Discontinued     750 mg 150 mL/hr over 60 Minutes Intravenous Every 12 hours 05/31/19 0126 06/03/19 0456   05/30/19 2200  vancomycin (VANCOREADY) IVPB 750 mg/150 mL  Status:  Discontinued     750 mg 150 mL/hr over 60 Minutes Intravenous Every 12 hours 05/30/19 1207 05/30/19 2126   05/25/19 1230  amoxicillin (AMOXIL) capsule 500 mg     500 mg Oral  Once 05/25/19 1120 05/25/19 1435   05/21/19 1000  vancomycin (VANCOCIN) IVPB 1000 mg/200 mL premix  Status:  Discontinued     1,000 mg 200 mL/hr over 60 Minutes Intravenous Every 12 hours 05/21/19 0948 05/30/19 1207   05/20/19 1400  cefTRIAXone (ROCEPHIN) 2 g in sodium chloride 0.9 % 100 mL IVPB  Status:  Discontinued     2 g 200 mL/hr over 30 Minutes Intravenous Every 24 hours 05/20/19 1328 05/22/19 0947   05/19/19 2200  levofloxacin (LEVAQUIN) IVPB 750 mg  Status:  Discontinued     750 mg 100 mL/hr over 90 Minutes Intravenous Every 24 hours 05/19/19 0212 05/19/19 0213   05/19/19 2200  levofloxacin (LEVAQUIN) IVPB 500 mg  Status:  Discontinued  500 mg 100 mL/hr over 60 Minutes Intravenous Every 24 hours 05/19/19 0213 05/19/19 0539   05/19/19 1000  vancomycin (VANCOREADY) IVPB 750 mg/150 mL  Status:  Discontinued     750 mg 150 mL/hr over 60 Minutes Intravenous Every 12 hours 05/19/19 0539 05/21/19 0948   05/19/19 0600  ceFEPIme (MAXIPIME) 2 g in sodium chloride 0.9 % 100 mL IVPB  Status:  Discontinued     2 g 200 mL/hr over 30 Minutes Intravenous Every 8 hours 05/19/19 0539 05/19/19 2348   05/19/19 0130  vancomycin (VANCOCIN) IVPB 1000 mg/200 mL premix     1,000 mg 200 mL/hr over 60 Minutes Intravenous  Once 05/19/19 0122 05/19/19 0253   05/18/19 2345  levofloxacin (LEVAQUIN) IVPB 750 mg     750 mg 100 mL/hr over 90 Minutes Intravenous  Once 05/18/19 2338 05/19/19 0140        Code Status: Full Code   Diet Order            Diet clear liquid Room service appropriate? No; Fluid consistency: Thin  Diet effective now                Infusions:  . lactated ringers 10 mL/hr at 05/24/19 0902  . lactated ringers 10 mL/hr at 06/23/19 1057  . vancomycin 750 mg (06/23/19 1100)    Scheduled Meds: . carvedilol  3.125 mg Oral BID WC  . ceFAZolin  2 g Other To OR  . feeding supplement  1 Container Oral TID BM  . ferrous sulfate  325 mg Oral BID  . gabapentin  300 mg Oral BID  . magnesium oxide  400 mg Oral BID  . methadone  20 mg Oral Daily  . nicotine  14 mg Transdermal Daily  . polyethylene glycol  17 g Oral BID  . prenatal multivitamin  1 tablet Oral Daily  . senna-docusate  1 tablet Oral BID    PRN meds: bisacodyl, HYDROmorphone (DILAUDID) injection, hydrOXYzine, ibuprofen, ondansetron **OR** ondansetron (ZOFRAN) IV, traZODone   Objective: Vitals:   06/23/19 1004 06/23/19 1046  BP: 116/78 125/86  Pulse:  (!) 106  Resp: 13 18  Temp: 98.7 F (37.1 C) 98.6 F (37 C)  SpO2:  99%    Intake/Output Summary (Last 24 hours) at 06/23/2019 1558 Last data filed at 06/23/2019 0929 Gross per 24 hour  Intake 1090 ml  Output 100 ml  Net 990 ml   Filed Weights   05/19/19 0400 05/24/19 0836 05/25/19 1728  Weight: 56.5 kg 56.5 kg 56.5 kg   Weight change:  Body mass index is 19.5 kg/m.   Physical Exam: General exam: Appears calm and comfortable.  Oral pain controlled Skin: No rashes, lesions or ulcers. HEENT: Atraumatic, normocephalic, supple neck, no obvious bleeding Lungs: Clear to auscultation bilaterally CVS: Regular rate and rhythm, no murmur GI/Abd soft, nontender, nondistended, bowel sound present CNS: Alert, awake, oriented x3 Psychiatry: Mood appropriate Extremities: No pedal edema, no calf tenderness  Data Review: I have personally reviewed the laboratory data and studies available.  Recent Labs  Lab 06/19/19 1328  WBC 5.1  NEUTROABS 3.1  HGB 10.4*  HCT 33.8*  MCV 86.2  PLT 280   Recent Labs  Lab 06/19/19 1328 06/22/19 1140  NA 138 139  K 4.1 4.3  CL 104 106  CO2 22 22   GLUCOSE 103* 87  BUN 11 14  CREATININE 0.57 0.59  CALCIUM 9.1 9.5  MG 1.6*  --   PHOS 4.9*  --  Signed, Lorin Glass, MD [facility] Pager: [phone] (Secure Chat preferred). 06/23/2019

## 2019-06-23 NOTE — Anesthesia Procedure Notes (Signed)
Procedure Name: Intubation Date/Time: 06/23/2019 7:33 AM Performed by: Wilburn Cornelia, CRNA Pre-anesthesia Checklist: Patient identified, Emergency Drugs available, Suction available, Patient being monitored and Timeout performed Patient Re-evaluated:Patient Re-evaluated prior to induction Oxygen Delivery Method: Circle system utilized Preoxygenation: Pre-oxygenation with 100% oxygen Induction Type: IV induction Ventilation: Mask ventilation without difficulty Laryngoscope Size: Mac and 3 Grade View: Grade IV Nasal Tubes: Nasal prep performed, Nasal Rae, Magill forceps- large, utilized and Left Tube size: 7.0 mm Number of attempts: 2 Placement Confirmation: positive ETCO2,  CO2 detector,  breath sounds checked- equal and bilateral and ETT inserted through vocal cords under direct vision Secured at: 25 cm Tube secured with: Tape Dental Injury: Teeth and Oropharynx as per pre-operative assessment  Comments: DLx1 by H. Truman Hayward CRNA - unable to visualize cords - blind attempt.  Esophageal intubation.  DLx1 by Dr. Elgie Congo with MAC 3 - 7.0 nasal ETT inserted.  +EtCO2. +/=BBS.

## 2019-06-23 NOTE — Transfer of Care (Signed)
Immediate Anesthesia Transfer of Care Note  Patient: Christy Pearson  Procedure(s) Performed: EXTRACTION OF TOOTH #'S 1,2,3,16,17,18,19, 20, 21,29, 30 AND 31 WITH ALVEOLOPLASTY AND GROSS DEBRIDEMENT OF REMAINING TEETH (N/A Mouth)  Patient Location: PACU  Anesthesia Type:General  Level of Consciousness: awake, alert  and oriented  Airway & Oxygen Therapy: Patient Spontanous Breathing and Patient connected to face mask oxygen  Post-op Assessment: Report given to RN and Post -op Vital signs reviewed and stable  Post vital signs: Reviewed and stable  Last Vitals:  Vitals Value Taken Time  BP 104/67 06/23/19 0946  Temp    Pulse 116 06/23/19 0946  Resp    SpO2 99 % 06/23/19 0946  Vitals shown include unvalidated device data.  Last Pain:  Vitals:   06/23/19 0544  TempSrc: Oral  PainSc:       Patients Stated Pain Goal: 3 (06/18/19 0951)  Complications: No apparent anesthesia complications

## 2019-06-23 NOTE — Op Note (Signed)
OPERATIVE REPORT  Patient:            Christy Pearson Date of Birth:  1990/07/12 MRN:                [id number]   DATE OF PROCEDURE:  06/23/2019  PREOPERATIVE DIAGNOSES: 1.  Endocarditis of the tricuspid valve 2.  Chronic apical periodontitis 3.  History of acute pulpitis 4.  Dental caries 5.  Retained root segments 6.  Chronic periodontitis 7.  Accretions  POSTOPERATIVE DIAGNOSES: 1.  Endocarditis of the tricuspid valve 2.  Chronic apical periodontitis 3.  History of acute pulpitis 4.  Dental caries 5.  Retained root segments 6.  Chronic periodontitis 7.  Accretions  OPERATIONS: 1. Multiple extraction of tooth numbers 1, 2, 3, 16, 17, 18, 19, 20, 21, 29, 30, and 31 2. 4 Quadrants of alveoloplasty 3. Gross debridement of remaining dentition   SURGEON: Charlynne Pander, DDS  ASSISTANT: Pearletha Alfred (dental assistant)  ANESTHESIA: General anesthesia via nasoendotracheal tube.  MEDICATIONS: 1. Ancef 2 g IV prior to invasive dental procedures. 2.  Vancomycin 750 mg IV every 12 hours per infectious disease  3. Local anesthesia with a total utilization of 5 carpules each containing 34 mg of lidocaine with 0.017 mg of epinephrine as well as 2 carpules each containing 9 mg of bupivacaine with 0.009 mg of epinephrine. 4.  Toradol 30 mg IV at the end of the dental medicine procedure.  SPECIMENS: There are 12 teeth that were discarded.  DRAINS: None  CULTURES: None  COMPLICATIONS: None  ESTIMATED BLOOD LOSS: 100 mLs.  INTRAVENOUS FLUIDS: 800 mLs of Lactated ringers solution.  INDICATIONS: The patient was recently diagnosed with tricuspid valve endocarditis.  A medically necessary dental consultation was then requested to evaluate poor dentition as the source of the endocarditis and provide treatment as indicated.  The patient was examined and treatment planned for multiple extractions of indicated teeth with alveoloplasty and gross debridement of remaining  dentition in the operating room with general anesthesia.  This treatment plan was formulated to decrease the risks and complications associated with dental infection from affecting the patient's systemic health and to prevent further episodes of endocarditis.  OPERATIVE FINDINGS: Patient was examined operating room number 4.  The teeth were identified for extraction.  Tooth numbers 1, 2, 3, 16, 17, 18, 19, 20, 21, 29, 30, 31 were identified for extraction.  The patient was noted to be affected by history of acute pulpitis, chronic apical periodontitis, multiple retained root segments, dental caries, chronic periodontitis, and accretions.   DESCRIPTION OF PROCEDURE: Patient was brought to the main operating room number 4. Patient was then placed in the supine position on the operating table. General anesthesia was then induced per the anesthesia team. The patient was then prepped and draped in the usual manner for dental medicine procedure. A timeout was performed. The patient was identified and procedures were verified. A throat pack was placed at this time. The oral cavity was then thoroughly examined with the findings noted above. The patient was then ready for dental medicine procedure as follows:  Local anesthesia was then administered sequentially with a total utilization of 5 carpules each containing 34 mg of lidocaine with 0.017 mg of epinephrine as well as 2 carpules  each containing 9 mg bupivacaine with 0.009 mg of epinephrine.  The Maxillary left and right quadrants first approached. Anesthesia was then delivered utilizing infiltration with lidocaine with epinephrine. A #15 blade incision was then made from the  maxillary right tuberosity and extended to the mesial #4.  A surgical flap was then carefully reflected.  The tooth numbers 1, 2, 3 were subluxated with a series of straight elevators.  A surgical handpiece and bur and copious amounts of sterile water were used to remove buccal and  interseptal bone around tooth numbers 1, 2, and 3.  The teeth were then subluxated again.  Tooth numbers 1, 2, and 3 were then removed with a 150 forceps without complications.  Alveoloplasty was then performed utilizing rongeurs and bone file to help achieve primary closure.  The tissues were approximated and trimmed appropriately.  The surgical site was irrigated with copious amounts sterile saline.  A piece of Surgifoam was placed in the extraction socket of tooth numbers 1, 2, 3- to assist in hemostasis.  The surgical site was then closed from the maxillary right tuberosity and extended to the distal of #4 utilizing 3-0 Chromic Gut suture in a continuous erupted suture technique x1.  An interproximal suture was placed between tooth numbers 4 and 5 with 3-0 Chromic Gut material.    At this point time of the maxillary left surgical site was approached.  A 15 blade incision was made from the distal of the tuberosity and extended to the mesial #15.  A surgical flap was then carefully reflected.  The retained roots in the area #16 were then subluxated with a series straight elevators.  A surgical handpiece and bur copious amounts sterile water was used to remove buccal and interseptal bone appropriately.  The roots were then elevated out with a series of Cryer's elevators.  The socket was then curetted and compressed appropriately.  Alveoloplasty was then performed utilizing rongeurs and bone file.  The surgical site was irrigated with copious amounts sterile saline.  A piece of Surgifoam was placed in the extraction socket.  The surgical site was then closed from the maxillary left tuberosity and extended to the distal #15 utilizing 3-0 Chromic Gut suture in a continuous erupted suture technique x1.    At this point time, the mandibular quadrants were approached. The patient was given bilateral inferior alveolar nerve blocks and long buccal nerve blocks utilizing the bupivacaine with epinephrine. Further  infiltration was then achieved utilizing the lidocaine with epinephrine. A 15 blade incision was then made from the distal of number 17 and extended to the mesial of #22.  The mandibular left teeth were then subluxated with a series straight elevators.  A surgical handpiece and bur and copious amounts of sterile water was used to remove bone around tooth numbers 20 and 21.  These teeth were then resubluxated the series straight elevators.  Tooth numbers 17, 18, 19, 20, 21 were then removed with a 151 forceps without complications.  Alveoloplasty was then performed utilizing rongeurs and bone file.  The tissues were approximated and trimmed appropriately.  The surgical sites were then irrigated with copious amounts sterile saline.  A piece of Surgifoam was placed in the extraction sockets of tooth #17, 18, 19, 20, and 21.  The mandibular left surgical site was then closed from the distal #17 extended the distal #22 utilizing 3-0 Chromic Gut suture in a continuous erupted suture technique x1.  At this point time the mandibular right quadrant was approached.  A 15 blade incision was made from the distal #32 and extended to the mesial #28 a surgical flap was then carefully reflected.  Tooth numbers 29, 30, 31 were subluxated with a series straight elevators.  A  surgical handpiece and bur and copious amounts sterile water was used to remove buccal and interseptal bone around tooth numbers 29, 30, 31.  The teeth were then resubluxated.  The tooth numbers 29, 30, 31 with a removed without complication.  Alveoloplasty was then performed utilizing rongeurs and bone file to help achieve primary closure.  The tissues were approximated and trimmed appropriately.  Surgical site was irrigated with copious amounts sterile saline.  A piece of Surgifoam was placed in the extraction socket of tooth numbers 29, 30, and 31.  Surgical site was then closed from the distal #32 and extended the distal #28 utilizing 3-0 Chromic Gut suture  in a continuous erupted suture technique x1.  At this point time, the remaining dentition was approached.  A sonic scaler was used to remove significant accretions.  A series of hand curettes were used to further remove accretions.  The sonic scaler was then again used to further refine the removal of accretions.  This completed the gross debridement procedure.  At this point time, the entire mouth was irrigated with copious amounts of sterile saline. The patient was examined for complications, seeing none, the dental medicine procedure was deemed to be complete. The throat pack was removed at this time. An oral airway was then placed at the request of the anesthesia team. A series of 4 x 4 gauze were placed in the mouth to aid hemostasis. The patient was then handed over to the anesthesia team for final disposition. After an appropriate amount of time, the patient was extubated and taken to the postanesthsia care unit in good condition. All counts were correct for the dental medicine procedure.  The patient was given 30 mg of Toradol IV by the anesthesia team at the end of the dental medicine procedure.  The Lovenox therapy is to be held until tomorrow morning and restarted at the discretion of the hospitalist team.  SCDs will be in place until that time unless discontinued by the hospitalist.  Ibuprofen has been ordered for the patient to use for moderate to severe pain.  Hospitalist may add additional IV/narcotic pain medication as indicated.   Lenn Cal, DDS.

## 2019-06-23 NOTE — Progress Notes (Signed)
Initial Nutrition Assessment  DOCUMENTATION CODES:   Not applicable  INTERVENTION:   -Boost Breeze po TID, each supplement provides 250 kcal and 9 grams of protein -MVI with minerals daily -RD will follow for diet advancement and adjust supplement regimen as appropriate  NUTRITION DIAGNOSIS:   Increased nutrient needs related to post-op healing as evidenced by estimated needs.  GOAL:   Patient will meet greater than or equal to 90% of their needs  MONITOR:   PO intake, Supplement acceptance, Diet advancement, Labs, Weight trends, Skin, I & O's  REASON FOR ASSESSMENT:   LOS    ASSESSMENT:   29 year old female with history of heroin abuse IV drug abuse hepatitis C kidney stones admitted with fever chills and back pain.  Pt admitted with septic endocarditis.   2/2- s/p TEE- revealed Tricuspid valve endocarditis with MRSA bacteremia, with likely paradoxical septic embolization (renal septic emboli) 2/4- s/p PROCEDURE: 1.  Right shoulder arthroscopy with irrigation, debridement, extensive debridement of the joint and subacromial space with subacromial bursectomy 2.  Left shoulder arthroscopy with irrigation, debridement, extensive debridement of the joint and subacromial space with subacromial bursectomy  3/5- s/p OPERATIONS: 1. Multiple extraction of tooth numbers 1, 2, 3, 16, 17, 18, 19, 20, 21, 29, 30, and 31 2. 4 Quadrants of alveoloplasty 3. Gross debridement of remaining dentition  Reviewed I/O's: +800 ml x 24 hours and +17 L since 06/09/19  Per CVTS notes, plan for possible valve replacement.   Pt receiving nursing care at time of visit.   Pt has had a good appetite throughout hospitalization. Noted meal completion 100%. Pt had just returned from tooth extractions and currently on a clear liquid diet. Pt may require mechanically altered diet once advanced.   Wt has been stable over the past year.   Medications reviewed and include lactated ringers infusion @ 10  ml/hr.   Labs reviewed.   Diet Order:   Diet Order            Diet clear liquid Room service appropriate? No; Fluid consistency: Thin  Diet effective now              EDUCATION NEEDS:   No education needs have been identified at this time  Skin:  Skin Assessment: Skin Integrity Issues: Skin Integrity Issues:: Incisions Incisions: closed rt shoulder, lt shoulder, lip  Last BM:  06/20/19  Height:   Ht Readings from Last 1 Encounters:  05/25/19 5' 7.01" (1.702 m)    Weight:   Wt Readings from Last 1 Encounters:  05/25/19 56.5 kg    Ideal Body Weight:  61.4 kg  BMI:  Body mass index is 19.5 kg/m.  Estimated Nutritional Needs:   Kcal:  1700-1900  Protein:  85-100 grams  Fluid:  > 1.7 L    Levada Schilling, RD, LDN, CDCES Registered Dietitian II Certified Diabetes Care and Education Specialist Please refer to [facility] for RD and/or RD on-call/weekend/after hours pager

## 2019-06-23 NOTE — Anesthesia Postprocedure Evaluation (Signed)
Anesthesia Post Note  Patient: Christy Pearson  Procedure(s) Performed: EXTRACTION OF TOOTH #'S 1,2,3,16,17,18,19, 20, 21,29, 30 AND 31 WITH ALVEOLOPLASTY AND GROSS DEBRIDEMENT OF REMAINING TEETH (N/A Mouth)     Patient location during evaluation: PACU Anesthesia Type: General Level of consciousness: awake and alert, oriented and patient cooperative Pain management: pain level controlled Vital Signs Assessment: post-procedure vital signs reviewed and stable Respiratory status: spontaneous breathing, nonlabored ventilation and respiratory function stable Cardiovascular status: blood pressure returned to baseline and stable Postop Assessment: no apparent nausea or vomiting Anesthetic complications: no    Last Vitals:  Vitals:   06/23/19 1004 06/23/19 1046  BP: 116/78 125/86  Pulse:  (!) 106  Resp: 13 18  Temp: 37.1 C 37 C  SpO2:  99%    Last Pain:  Vitals:   06/23/19 1046  TempSrc: Oral  PainSc:                  Lannie Fields

## 2019-06-24 DIAGNOSIS — R7881 Bacteremia: Secondary | ICD-10-CM | POA: Diagnosis not present

## 2019-06-24 DIAGNOSIS — B9562 Methicillin resistant Staphylococcus aureus infection as the cause of diseases classified elsewhere: Secondary | ICD-10-CM | POA: Diagnosis not present

## 2019-06-24 NOTE — Progress Notes (Signed)
PROGRESS NOTE  Christy Pearson  DOB: 1991-01-15  PCP: Patient, No Pcp Per HQP:[id number]  DOA: 05/18/2019 Admitted From: Home  LOS: 36 days   Chief Complaint  Patient presents with  . Fever  . Back Pain   Brief narrative: The patient is a 29 year old Caucasian female with PMH of polysubstance abuse including IV heroin, crystal meth, history of hepatitis C.  She presented to the ED on 1/28 with complaint of not feeling well for 2 weeks with fever, chest pain, cough,\flank pain and weakness.  She had a fever of 103.7.  WBC count normal. CT scan showed bilateral cavitary nodules, pneumonia concerning for septic emboli. Blood cultures sent on admission came positive for MRSA. TEE noted tricuspid valve endocarditis. She was also noted to have bilateral shoulder and spine infection.   2/4, she underwent arthroscopy, irrigation, debridement of both shoulder joints by orthopedic surgery. ID recommended 6 weeks of IV vancomycin.  PICC line placed on 2/14.  Patient will complete the course of IV vancomycin on 06/30/2019.   She is to remain in the hospital to complete the course of IV vancomycin for MRSA bacteremia given that she is an IV drug user requires supervision.    Her hospital course has also been notable for a blood transfusion and IV iron secondary to anemia of chronic disease.  She also had a UTI which was treated and a PICC line was placed for long-term IV antibiotics on 06/04/2019. 3/1, patient was seen by dentist Dr. Kristin Bruins to evaluate poor dentition as a possible source of infection for this patient. 3/5, patient underwent extraction of multiple teeth extraction as well as alveoloplasty.   Subjective: Patient was seen and examined this morning. Has icebag on her face to control pain.  Remains on low-dose IV Dilaudid as needed.    Assessment/Plan: MRSA bacteremiasecondary to IV drug use Tricuspid valve endocarditis,  multilevel vertebral osteomyelitis,  septic pulmonary  emboli,  bilateral septic shoulder arthritis suspected left wrist septic arthrits Severe sepsis - POA, resolved. -2/4, she underwent arthroscopy, irrigation, debridement of both shoulder joints by orthopedic surgery. -ID recommended 6 weeks of IV vancomycin. PICC line placed on 2/14.  -Patient will complete the course of IV vancomycin on 06/30/2019.   -She is to remain in the hospital to complete the course of IV vancomycin for MRSA bacteremia given that she is an IV drug user requires supervision.    Poor dental hygiene  multiple dental caries, periapical abscess -Dr. Kristin Bruins is reviewed her orthopantogram and feels that the patient needs multiple extractions and requested preoperative clearance to see if she was safe for general anesthesia -3/1, patient was seen by dentist Dr. Kristin Bruins to evaluate poor dentition as a possible source of infection for this patient. -3/5, patient underwent extraction of multiple teeth extraction as well as alveoloplasty.   History of IV drug abuse -Currently on methadone 20 mg daily, gabapentin 300 mg twice daily, ibuprofen as needed every 6 hours. -After the procedure on 3/5, patient has been on IV Diladuid 0.5mg  q4hr PRN. I have stressed to the patient that we need to start tapering it from tomorrow.   Anemia of chronic disease -Status post 1 unit PRBC transfusion as well as IV iron therapy.   -Continue ferrous sulfate.  Insomnia/sleep disturbance -Switched back to Trazodone 50 mg p.o. nightly as needed  E. Coli UTI - POA -Completed course of IV Rocephin.  Leg Cramps -Continue with Gabapentin 300 mg po BID  Hypomagnesemia/hypophosphatemia -Improved with replacement.  Tobacco Abuse -  Patient states that she smokes 1 pack/day -Continue nicotine patch 14 mg transdermal daily -Smoking cessation counseling given  DVT prophylaxis:  Patient apparently has been refusing to get Lovenox for last 1 month.  Continue SCDs.   Antimicrobials:  IV  vancomycin  Fluid: None Diet: Regular diet      Code Status:  Full code Mobility: Encourage ambulation Family Communication:  None at bedside Discharge plan:  Anticipated date: IV vancomycin till 3/12 Disposition: Home Barriers: Need to stay in the hospital to complete IV antibiotics course  Consultants:  Cardiology  ID  Orthopedic surgery  Cardiothoracic surgery  Dentistry  Antimicrobials: Anti-infectives (From admission, onward)   Start     Dose/Rate Route Frequency Ordered Stop   06/23/19 0730  ceFAZolin (ANCEF) powder 2 g     2 g Other To Surgery 06/20/19 1302 06/24/19 0730   06/19/19 2300  vancomycin (VANCOCIN) IVPB 1000 mg/200 mL premix  Status:  Discontinued     1,000 mg 200 mL/hr over 60 Minutes Intravenous Every 12 hours 06/19/19 2156 06/19/19 2159   06/19/19 2300  vancomycin (VANCOREADY) IVPB 750 mg/150 mL     750 mg 150 mL/hr over 60 Minutes Intravenous Every 12 hours 06/19/19 2159     06/19/19 2200  vancomycin (VANCOREADY) IVPB 750 mg/150 mL  Status:  Discontinued     750 mg 150 mL/hr over 60 Minutes Intravenous Every 12 hours 06/19/19 1448 06/19/19 2156   06/18/19 2200  vancomycin (VANCOREADY) IVPB 750 mg/150 mL  Status:  Discontinued     750 mg 150 mL/hr over 60 Minutes Intravenous Every 12 hours 06/18/19 1221 06/18/19 1225   06/18/19 2100  vancomycin (VANCOREADY) IVPB 750 mg/150 mL  Status:  Discontinued     750 mg 150 mL/hr over 60 Minutes Intravenous Every 12 hours 06/18/19 1225 06/19/19 1448   06/09/19 0800  vancomycin (VANCOREADY) IVPB 750 mg/150 mL  Status:  Discontinued     750 mg 150 mL/hr over 60 Minutes Intravenous Every 12 hours 06/08/19 1840 06/18/19 1221   06/03/19 1800  vancomycin (VANCOCIN) IVPB 1000 mg/200 mL premix  Status:  Discontinued     1,000 mg 200 mL/hr over 60 Minutes Intravenous Every 12 hours 06/03/19 0456 06/08/19 1841   05/31/19 0200  vancomycin (VANCOREADY) IVPB 750 mg/150 mL  Status:  Discontinued     750 mg 150 mL/hr  over 60 Minutes Intravenous Every 12 hours 05/31/19 0126 06/03/19 0456   05/30/19 2200  vancomycin (VANCOREADY) IVPB 750 mg/150 mL  Status:  Discontinued     750 mg 150 mL/hr over 60 Minutes Intravenous Every 12 hours 05/30/19 1207 05/30/19 2126   05/25/19 1230  amoxicillin (AMOXIL) capsule 500 mg     500 mg Oral  Once 05/25/19 1120 05/25/19 1435   05/21/19 1000  vancomycin (VANCOCIN) IVPB 1000 mg/200 mL premix  Status:  Discontinued     1,000 mg 200 mL/hr over 60 Minutes Intravenous Every 12 hours 05/21/19 0948 05/30/19 1207   05/20/19 1400  cefTRIAXone (ROCEPHIN) 2 g in sodium chloride 0.9 % 100 mL IVPB  Status:  Discontinued     2 g 200 mL/hr over 30 Minutes Intravenous Every 24 hours 05/20/19 1328 05/22/19 0947   05/19/19 2200  levofloxacin (LEVAQUIN) IVPB 750 mg  Status:  Discontinued     750 mg 100 mL/hr over 90 Minutes Intravenous Every 24 hours 05/19/19 0212 05/19/19 0213   05/19/19 2200  levofloxacin (LEVAQUIN) IVPB 500 mg  Status:  Discontinued  500 mg 100 mL/hr over 60 Minutes Intravenous Every 24 hours 05/19/19 0213 05/19/19 0539   05/19/19 1000  vancomycin (VANCOREADY) IVPB 750 mg/150 mL  Status:  Discontinued     750 mg 150 mL/hr over 60 Minutes Intravenous Every 12 hours 05/19/19 0539 05/21/19 0948   05/19/19 0600  ceFEPIme (MAXIPIME) 2 g in sodium chloride 0.9 % 100 mL IVPB  Status:  Discontinued     2 g 200 mL/hr over 30 Minutes Intravenous Every 8 hours 05/19/19 0539 05/19/19 2348   05/19/19 0130  vancomycin (VANCOCIN) IVPB 1000 mg/200 mL premix     1,000 mg 200 mL/hr over 60 Minutes Intravenous  Once 05/19/19 0122 05/19/19 0253   05/18/19 2345  levofloxacin (LEVAQUIN) IVPB 750 mg     750 mg 100 mL/hr over 90 Minutes Intravenous  Once 05/18/19 2338 05/19/19 0140        Code Status: Full Code   Diet Order            Diet regular Room service appropriate? Yes; Fluid consistency: Thin  Diet effective 1400              Infusions:  . lactated ringers 10  mL/hr at 05/24/19 0902  . lactated ringers 10 mL/hr at 06/23/19 1057  . vancomycin 750 mg (06/24/19 1018)    Scheduled Meds: . carvedilol  3.125 mg Oral BID WC  . feeding supplement  1 Container Oral TID BM  . ferrous sulfate  325 mg Oral BID  . gabapentin  300 mg Oral BID  . magnesium oxide  400 mg Oral BID  . methadone  20 mg Oral Daily  . nicotine  14 mg Transdermal Daily  . polyethylene glycol  17 g Oral BID  . prenatal multivitamin  1 tablet Oral Daily  . senna-docusate  1 tablet Oral BID    PRN meds: bisacodyl, HYDROmorphone (DILAUDID) injection, hydrOXYzine, ibuprofen, ondansetron **OR** ondansetron (ZOFRAN) IV, traZODone   Objective: Vitals:   06/24/19 0636 06/24/19 1016  BP: 99/72 (!) 118/102  Pulse: 83 93  Resp: 17 16  Temp: 98.3 F (36.8 C) 97.8 F (36.6 C)  SpO2: 99% 100%    Intake/Output Summary (Last 24 hours) at 06/24/2019 1202 Last data filed at 06/23/2019 2142 Gross per 24 hour  Intake 660 ml  Output 1 ml  Net 659 ml   Filed Weights   05/19/19 0400 05/24/19 0836 05/25/19 1728  Weight: 56.5 kg 56.5 kg 56.5 kg   Weight change:  Body mass index is 19.5 kg/m.   Physical Exam: General exam: Appears calm and comfortable.  Oral pain controlled Skin: No rashes, lesions or ulcers. HEENT: Atraumatic, normocephalic, supple neck, no obvious bleeding.  Lungs: Clear to auscultation bilaterally CVS: Regular rate and rhythm, no murmur GI/Abd soft, nontender, nondistended, bowel sound present CNS: Alert, awake, oriented x3 Psychiatry: Mood appropriate Extremities: No pedal edema, no calf tenderness  Data Review: I have personally reviewed the laboratory data and studies available.  Recent Labs  Lab 06/19/19 1328  WBC 5.1  NEUTROABS 3.1  HGB 10.4*  HCT 33.8*  MCV 86.2  PLT 280   Recent Labs  Lab 06/19/19 1328 06/22/19 1140  NA 138 139  K 4.1 4.3  CL 104 106  CO2 22 22  GLUCOSE 103* 87  BUN 11 14  CREATININE 0.57 0.59  CALCIUM 9.1 9.5  MG  1.6*  --   PHOS 4.9*  --     Signed, Lorin Glass, MD [facility]  Pager: [phone] (Secure Chat preferred). 06/24/2019

## 2019-06-24 NOTE — Progress Notes (Signed)
Pt. Requesting to have new IV placed closer to when her next antibiotic is due at 2100. RN made aware and will place IV team consult when ready.

## 2019-06-25 DIAGNOSIS — R7881 Bacteremia: Secondary | ICD-10-CM | POA: Diagnosis not present

## 2019-06-25 DIAGNOSIS — B9562 Methicillin resistant Staphylococcus aureus infection as the cause of diseases classified elsewhere: Secondary | ICD-10-CM | POA: Diagnosis not present

## 2019-06-25 NOTE — Plan of Care (Signed)
  Problem: Education: Goal: Knowledge of General Education information will improve Description Including pain rating scale, medication(s)/side effects and non-pharmacologic comfort measures Outcome: Progressing   

## 2019-06-25 NOTE — Progress Notes (Signed)
PROGRESS NOTE    Christy Pearson  KCM:[id number] DOB: 10-Nov-1990 DOA: 05/18/2019 PCP: Patient, No Pcp Per   Brief Narrative:  Patient is a 29 year old female with history of polysubstance abuse including heroin, crystal meth, hepatitis C open to the emergency department on 1/28 with complaints of fever, chest pain, cough, flank pain, weakness.  She was febrile on presentation.  CT imaging showed bilateral cavitary nodules, pneumonia concerning for septic emboli.  Blood cultures came came  positive for MRSA.  TEE noted tricuspid valve endocarditis.  She was also found to have bilateral shoulder, spine infection. 2/4, she underwent arthroscopy, irrigation, debridement of both shoulder joints by orthopedic surgery. ID recommended 6 weeks of IV vancomycin.  PICC line placed on 2/14.  Patient will complete the course of IV vancomycin on 06/30/2019.  She is to remain in the hospital to complete the course of IV vancomycin for MRSA bacteremia given that she is an IV drug user requires supervision.  Her hospital course has also been notable for a blood transfusion and IV iron secondary to anemia of chronic disease. She also had a UTI which was treated and a PICC line was placed for long-term IV antibiotics on 06/04/2019. 3/1, patient was seen by dentist Dr. Kristin Bruins to evaluate poor dentition as a possible source of infection for this patient. 3/5, patient underwent extraction of multiple teeth extraction as well as alveoloplasty  Assessment & Plan:   Principal Problem:   MRSA bacteremia Active Problems:   Injection of illicit drug within last 12 months   Hepatitis C antibody positive, s/p spontaneously cleared infection   Sepsis (HCC)   Septic pulmonary embolism (HCC)   Substance use disorder   Suspected endocarditis   IV drug abuse (HCC)   Septic embolism (HCC)   Staphylococcal arthritis of right shoulder (HCC)   Staphylococcal arthritis of left wrist (HCC)   PFO (patent foramen ovale)  Endocarditis of tricuspid valve   Septic arthritis of shoulder, left (HCC)   Septic arthritis of shoulder, right (HCC)   Elevated LFTs  MRSA bacteremiasecondary to IV drug use Tricuspid valve endocarditis,  multilevel vertebral osteomyelitis,  septic pulmonary emboli,  bilateral septic shoulder arthritis suspected left wrist septic arthrits Severe sepsis - POA, resolved. -2/4, she underwent arthroscopy, irrigation, debridement of both shoulder joints by orthopedic surgery. -ID recommended 6 weeks of IV vancomycin. PICC line placed on 2/14.  -Patient will complete the course of IV vancomycin on 06/30/2019.  -She is to remain in the hospital to complete the course of IV vancomycin for MRSA bacteremia given that she is an IV drug user requires supervision.   Poor dental hygiene  multiple dental caries, periapical abscess -Dr. Kristin Bruins  reviewed her orthopantogram and felt that the patient needs multiple extractions and requested preoperative clearance to see if she was safe for general anesthesia -3/1, patient was seen by dentist Dr. Kristin Bruins to evaluate poor dentition as a possible source of infection for this patient. -3/5, patient underwent extraction of multiple teeth extraction as well as alveoloplasty.   History of IV drug abuse -Currently on methadone 20 mg daily, gabapentin 300 mg twice daily, ibuprofen as needed every 6 hours. -After the procedure on 3/5, patient has been on IV Diladuid 0.5mg  q4hr PRN.  We will taper pain medicines.   Anemia of chronic disease -Status post 1 unit PRBC transfusion as well as IV iron therapy.   -Continue ferrous sulfate.  Insomnia/sleep disturbance -Switched back to Trazodone 50 mg p.o. nightly as needed  E.  Coli UTI - POA -Completed course of IV Rocephin.  Leg Cramps -Continue with Gabapentin 300 mg po BID  Hypomagnesemia/hypophosphatemia -Improved with replacement.  TobaccoAbuse -Patient states that she smokes 1  pack/day -Continue nicotine patch 14 mg transdermal daily -Smoking cessation counseling given  Nutrition Problem: Increased nutrient needs Etiology: post-op healing      DVT prophylaxis: SCDs.  Refused Lovenox Code Status: Full code Family Communication: None present at the bedside Disposition Plan: Patient is from home.  She needs IV antibiotics till 3/12.  Anticipate discharge to home after completion of antibiotics   Consultants: Urology, ID, orthopedics, cardiothoracic surgery, dentistry  Procedures: As above  Antimicrobials:  Anti-infectives (From admission, onward)   Start     Dose/Rate Route Frequency Ordered Stop   06/23/19 0730  ceFAZolin (ANCEF) powder 2 g     2 g Other To Surgery 06/20/19 1302 06/24/19 0730   06/19/19 2300  vancomycin (VANCOCIN) IVPB 1000 mg/200 mL premix  Status:  Discontinued     1,000 mg 200 mL/hr over 60 Minutes Intravenous Every 12 hours 06/19/19 2156 06/19/19 2159   06/19/19 2300  vancomycin (VANCOREADY) IVPB 750 mg/150 mL     750 mg 150 mL/hr over 60 Minutes Intravenous Every 12 hours 06/19/19 2159     06/19/19 2200  vancomycin (VANCOREADY) IVPB 750 mg/150 mL  Status:  Discontinued     750 mg 150 mL/hr over 60 Minutes Intravenous Every 12 hours 06/19/19 1448 06/19/19 2156   06/18/19 2200  vancomycin (VANCOREADY) IVPB 750 mg/150 mL  Status:  Discontinued     750 mg 150 mL/hr over 60 Minutes Intravenous Every 12 hours 06/18/19 1221 06/18/19 1225   06/18/19 2100  vancomycin (VANCOREADY) IVPB 750 mg/150 mL  Status:  Discontinued     750 mg 150 mL/hr over 60 Minutes Intravenous Every 12 hours 06/18/19 1225 06/19/19 1448   06/09/19 0800  vancomycin (VANCOREADY) IVPB 750 mg/150 mL  Status:  Discontinued     750 mg 150 mL/hr over 60 Minutes Intravenous Every 12 hours 06/08/19 1840 06/18/19 1221   06/03/19 1800  vancomycin (VANCOCIN) IVPB 1000 mg/200 mL premix  Status:  Discontinued     1,000 mg 200 mL/hr over 60 Minutes Intravenous Every 12  hours 06/03/19 0456 06/08/19 1841   05/31/19 0200  vancomycin (VANCOREADY) IVPB 750 mg/150 mL  Status:  Discontinued     750 mg 150 mL/hr over 60 Minutes Intravenous Every 12 hours 05/31/19 0126 06/03/19 0456   05/30/19 2200  vancomycin (VANCOREADY) IVPB 750 mg/150 mL  Status:  Discontinued     750 mg 150 mL/hr over 60 Minutes Intravenous Every 12 hours 05/30/19 1207 05/30/19 2126   05/25/19 1230  amoxicillin (AMOXIL) capsule 500 mg     500 mg Oral  Once 05/25/19 1120 05/25/19 1435   05/21/19 1000  vancomycin (VANCOCIN) IVPB 1000 mg/200 mL premix  Status:  Discontinued     1,000 mg 200 mL/hr over 60 Minutes Intravenous Every 12 hours 05/21/19 0948 05/30/19 1207   05/20/19 1400  cefTRIAXone (ROCEPHIN) 2 g in sodium chloride 0.9 % 100 mL IVPB  Status:  Discontinued     2 g 200 mL/hr over 30 Minutes Intravenous Every 24 hours 05/20/19 1328 05/22/19 0947   05/19/19 2200  levofloxacin (LEVAQUIN) IVPB 750 mg  Status:  Discontinued     750 mg 100 mL/hr over 90 Minutes Intravenous Every 24 hours 05/19/19 0212 05/19/19 0213   05/19/19 2200  levofloxacin (LEVAQUIN) IVPB 500 mg  Status:  Discontinued     500 mg 100 mL/hr over 60 Minutes Intravenous Every 24 hours 05/19/19 0213 05/19/19 0539   05/19/19 1000  vancomycin (VANCOREADY) IVPB 750 mg/150 mL  Status:  Discontinued     750 mg 150 mL/hr over 60 Minutes Intravenous Every 12 hours 05/19/19 0539 05/21/19 0948   05/19/19 0600  ceFEPIme (MAXIPIME) 2 g in sodium chloride 0.9 % 100 mL IVPB  Status:  Discontinued     2 g 200 mL/hr over 30 Minutes Intravenous Every 8 hours 05/19/19 0539 05/19/19 2348   05/19/19 0130  vancomycin (VANCOCIN) IVPB 1000 mg/200 mL premix     1,000 mg 200 mL/hr over 60 Minutes Intravenous  Once 05/19/19 0122 05/19/19 0253   05/18/19 2345  levofloxacin (LEVAQUIN) IVPB 750 mg     750 mg 100 mL/hr over 90 Minutes Intravenous  Once 05/18/19 2338 05/19/19 0140      Subjective: Patient seen and examined at the bedside this  morning.  Hemodynamically stable.  Complaining of dental pain.  Sleeping and was hesitant to wake up during my evaluation.  Objective: Vitals:   06/24/19 1016 06/24/19 1408 06/24/19 2142 06/25/19 0611  BP: (!) 118/102 (!) 115/96 (!) 128/91 108/85  Pulse: 93 (!) 105 97 80  Resp: 16 15 18 18   Temp: 97.8 F (36.6 C) (!) 97.5 F (36.4 C) 98.8 F (37.1 C) 98 F (36.7 C)  TempSrc: Oral Oral Oral Axillary  SpO2: 100% 100% 100% 100%  Weight:      Height:        Intake/Output Summary (Last 24 hours) at 06/25/2019 0911 Last data filed at 06/24/2019 1500 Gross per 24 hour  Intake 400.31 ml  Output --  Net 400.31 ml   Filed Weights   05/19/19 0400 05/24/19 0836 05/25/19 1728  Weight: 56.5 kg 56.5 kg 56.5 kg    Examination:  General exam: Not in distress,average built HEENT:PERRL,Oral mucosa moist, Ear/Nose normal on gross exam Respiratory system: Bilateral equal air entry, normal vesicular breath sounds, no wheezes or crackles  Cardiovascular system: S1 & S2 heard, RRR. No JVD, murmurs, rubs, gallops or clicks. No pedal edema. Gastrointestinal system: Abdomen is nondistended, soft and nontender. No organomegaly or masses felt. Normal bowel sounds heard. Central nervous system: Alert and oriented. No focal neurological deficits. Extremities: No edema, no clubbing ,no cyanosis, distal peripheral pulses palpable. Skin: No rashes, lesions or ulcers,no icterus ,no pallor,tattos   Data Reviewed: I have personally reviewed following labs and imaging studies  CBC: Recent Labs  Lab 06/19/19 1328  WBC 5.1  NEUTROABS 3.1  HGB 10.4*  HCT 33.8*  MCV 86.2  PLT 280   Basic Metabolic Panel: Recent Labs  Lab 06/19/19 1328 06/22/19 1140  NA 138 139  K 4.1 4.3  CL 104 106  CO2 22 22  GLUCOSE 103* 87  BUN 11 14  CREATININE 0.57 0.59  CALCIUM 9.1 9.5  MG 1.6*  --   PHOS 4.9*  --    GFR: Estimated Creatinine Clearance: 95 mL/min (by C-G formula based on SCr of 0.59 mg/dL). Liver  Function Tests: Recent Labs  Lab 06/19/19 1328  AST 23  ALT 24  ALKPHOS 125  BILITOT 0.6  PROT 7.2  ALBUMIN 3.3*   No results for input(s): LIPASE, AMYLASE in the last 168 hours. No results for input(s): AMMONIA in the last 168 hours. Coagulation Profile: No results for input(s): INR, PROTIME in the last 168 hours. Cardiac Enzymes: No results for input(s):  CKTOTAL, CKMB, CKMBINDEX, TROPONINI in the last 168 hours. BNP (last 3 results) No results for input(s): PROBNP in the last 8760 hours. HbA1C: No results for input(s): HGBA1C in the last 72 hours. CBG: No results for input(s): GLUCAP in the last 168 hours. Lipid Profile: No results for input(s): CHOL, HDL, LDLCALC, TRIG, CHOLHDL, LDLDIRECT in the last 72 hours. Thyroid Function Tests: No results for input(s): TSH, T4TOTAL, FREET4, T3FREE, THYROIDAB in the last 72 hours. Anemia Panel: No results for input(s): VITAMINB12, FOLATE, FERRITIN, TIBC, IRON, RETICCTPCT in the last 72 hours. Sepsis Labs: No results for input(s): PROCALCITON, LATICACIDVEN in the last 168 hours.  No results found for this or any previous visit (from the past 240 hour(s)).       Radiology Studies: No results found.      Scheduled Meds: . carvedilol  3.125 mg Oral BID WC  . feeding supplement  1 Container Oral TID BM  . ferrous sulfate  325 mg Oral BID  . gabapentin  300 mg Oral BID  . magnesium oxide  400 mg Oral BID  . methadone  20 mg Oral Daily  . nicotine  14 mg Transdermal Daily  . polyethylene glycol  17 g Oral BID  . prenatal multivitamin  1 tablet Oral Daily  . senna-docusate  1 tablet Oral BID   Continuous Infusions: . lactated ringers 10 mL/hr at 05/24/19 0902  . lactated ringers 10 mL/hr at 06/23/19 1057  . vancomycin 750 mg (06/24/19 2143)     LOS: 37 days    Time spent: 25 mins.More than 50% of that time was spent in counseling and/or coordination of care.      Shelly Coss, MD [facility] P3/10/2019, 9:11 AM

## 2019-06-26 DIAGNOSIS — B9562 Methicillin resistant Staphylococcus aureus infection as the cause of diseases classified elsewhere: Secondary | ICD-10-CM | POA: Diagnosis not present

## 2019-06-26 DIAGNOSIS — R7881 Bacteremia: Secondary | ICD-10-CM | POA: Diagnosis not present

## 2019-06-26 LAB — VANCOMYCIN, TROUGH: Vancomycin Tr: 9 ug/mL — ABNORMAL LOW (ref 15–20)

## 2019-06-26 LAB — VANCOMYCIN, PEAK: Vancomycin Pk: 26 ug/mL — ABNORMAL LOW (ref 30–40)

## 2019-06-26 MED ORDER — WHITE PETROLATUM EX OINT
TOPICAL_OINTMENT | CUTANEOUS | Status: AC
  Administered 2019-06-26: 0.2
  Filled 2019-06-26: qty 28.35

## 2019-06-26 NOTE — Progress Notes (Signed)
Pharmacy Antibiotic Note  Christy Pearson is a 29 y.o. female admitted on 05/18/2019 with severe sepsis. Pharmacy consulted on 05/19/19 for Vancomycin dosing. 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 S/p bilateral shoulder arthroscopy, I & D on 05/25/19.   Vanc peak 26, vanc trough 9 AUC 509.7  Plan: Continue Vancomycin 750 mg IV q12h Planning 6 weeks of IV antibiotics, thru 07/09/19 Bmet q72hrs Vanc levels weekly.  Thanks for allowing pharmacy to be a part of this patient's care.  Talbert Cage, PharmD Clinical Pharmacist

## 2019-06-26 NOTE — Social Work (Signed)
Pt left paperwork for financial aid in this writer's mail box beside office on unit. CSW forwarded it to Customer Service, Attn: Patient Accounting/SEPP at the advice of financial counseling department.   Octavio Graves, MSW, LCSW [facility] Clinical Social Work

## 2019-06-26 NOTE — Progress Notes (Signed)
Occupational Therapy Treatment Patient Details Name: Christy Pearson MRN: [id number] DOB: [birth year] Today's Date: 06/26/2019    History of present illness Pt is a 29 y/o female admitted with fevere chills and back pain. 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 PMH hepatitis C, Kidney stones, IV drug abuse,    OT comments  Pt progressing well towards OT goals.  Reports increased soreness in R UE compared to L UE, but able to demonstrate increased functional use of UEs.  Increased AROM to [facility] (160*), demonstrating good techniques without compensatory movements with R UE wall flexion slides/scaption. Reviewed progression of UE ROM exercise program. Updated dc plan to no follow up, as I believe she will begin to functionally use UEs and progress herself well. Decreased frequency to 1x/week.  Pt with no questions or concerns, excited about new progress with shoulder ROM. Will follow acutely.     Follow Up Recommendations  No OT follow up    Equipment Recommendations  None recommended by OT    Recommendations for Other Services      Precautions / Restrictions Precautions Precautions: None Restrictions RUE Weight Bearing: Weight bearing as tolerated LUE Weight Bearing: Weight bearing as tolerated       Mobility Bed Mobility               General bed mobility comments: OOB at nurses station  Transfers Overall transfer level: Independent                    Balance Overall balance assessment: No apparent balance deficits (not formally assessed)                                         ADL either performed or assessed with clinical judgement   ADL         Grooming Details (indicate cue type and reason): reports able to pull hair up overhead now                               Functional mobility during ADLs: Independent General ADL Comments: focused on UE ROM exercises: see below for details      Vision       Perception     Praxis      Cognition Arousal/Alertness: Awake/alert Behavior During Therapy: WFL for tasks assessed/performed Overall Cognitive Status: Within Functional Limits for tasks assessed                                          Exercises Exercises: Other exercises;General Upper Extremity Shoulder Exercises Shoulder Flexion: AROM;Both;10 reps;Standing Shoulder ABduction: AROM;Both;10 reps;Standing Other Exercises Other Exercises: Completed 10 reps 1 set of wall slides flexion/extension, scaption with R UE; wall pushups x 10 reps B UEs.    Shoulder Instructions       General Comments Educated on completion of exercises 3/x day 3 sets 10 reps, educated on how to progress exercies from 10-15 with the last 2 being more difficult     Pertinent Vitals/ Pain       Pain Assessment: 0-10 Pain Score: 2  Pain Location: R shoulder Pain Descriptors / Indicators:  Sore Pain Intervention(s): Monitored during session;Limited activity within patient's tolerance  Home Living                                          Prior Functioning/Environment              Frequency  Min 1X/week        Progress Toward Goals  OT Goals(current goals can now be found in the care plan section)  Progress towards OT goals: Progressing toward goals  Acute Rehab OT Goals Patient Stated Goal: to improve arm movement and strength OT Goal Formulation: With patient  Plan Frequency needs to be updated;Discharge plan needs to be updated    Co-evaluation                 AM-PAC OT "6 Clicks" Daily Activity     Outcome Measure   Help from another person eating meals?: None Help from another person taking care of personal grooming?: None Help from another person toileting, which includes using toliet, bedpan, or  urinal?: None Help from another person bathing (including washing, rinsing, drying)?: None Help from another person to put on and taking off regular upper body clothing?: None Help from another person to put on and taking off regular lower body clothing?: None 6 Click Score: 24    End of Session    OT Visit Diagnosis: Muscle weakness (generalized) (M62.81) Pain - Right/Left: Right Pain - part of body: Shoulder   Activity Tolerance Patient tolerated treatment well   Patient Left with call bell/phone within reach;Other (comment)(in room)   Nurse Communication          Time: [phone] OT Time Calculation (min): 11 min  Charges: OT General Charges $OT Visit: 1 Visit OT Treatments $Therapeutic Exercise: 8-22 mins  Barry Brunner, OT Acute Rehabilitation Services Pager [phone] Office [phone]    Chancy Milroy 06/26/2019, 1:53 PM

## 2019-06-26 NOTE — Progress Notes (Addendum)
PROGRESS NOTE    Christy Pearson  ACZ:[id number] DOB: November 23, 1990 DOA: 05/18/2019 PCP: Patient, No Pcp Per   Brief Narrative:  Patient is a 29 year old female with history of polysubstance abuse including heroin, crystal meth, hepatitis C open to the emergency department on 1/28 with complaints of fever, chest pain, cough, flank pain, weakness.  She was febrile on presentation.  CT imaging showed bilateral cavitary nodules, pneumonia concerning for septic emboli.  Blood cultures came came  positive for MRSA.  TEE noted tricuspid valve endocarditis.  She was also found to have bilateral shoulder, spine infection. 2/4, she underwent arthroscopy, irrigation, debridement of both shoulder joints by orthopedic surgery. ID recommended 6 weeks of IV vancomycin.  PICC line placed on 2/14.  Patient will complete the course of IV vancomycin on 06/30/2019.  She is to remain in the hospital to complete the course of IV vancomycin for MRSA bacteremia given that she is an IV drug user requires supervision.  Her hospital course has also been notable for a blood transfusion and IV iron secondary to anemia of chronic disease. She also had a UTI which was treated and a PICC line was placed for long-term IV antibiotics on 07/02/2019. 3/1, patient was seen by dentist Dr. Kristin Bruins to evaluate poor dentition as a possible source of infection for this patient. 3/5, patient underwent extraction of multiple teeth extraction as well as alveoloplasty.  Assessment & Plan:   Principal Problem:   MRSA bacteremia Active Problems:   Injection of illicit drug within last 12 months   Hepatitis C antibody positive, s/p spontaneously cleared infection   Sepsis (HCC)   Septic pulmonary embolism (HCC)   Substance use disorder   Suspected endocarditis   IV drug abuse (HCC)   Septic embolism (HCC)   Staphylococcal arthritis of right shoulder (HCC)   Staphylococcal arthritis of left wrist (HCC)   PFO (patent foramen ovale)   Endocarditis of tricuspid valve   Septic arthritis of shoulder, left (HCC)   Septic arthritis of shoulder, right (HCC)   Elevated LFTs  MRSA bacteremiasecondary to IV drug use Tricuspid valve endocarditis,  multilevel vertebral osteomyelitis,  septic pulmonary emboli,  bilateral septic shoulder arthritis suspected left wrist septic arthrits Severe sepsis - POA, resolved. -2/4, she underwent arthroscopy, irrigation, debridement of both shoulder joints by orthopedic surgery. -ID recommended 6 weeks of IV vancomycin. PICC line placed on 2/14.  -Patient will complete the course of IV vancomycin on 06/30/2019.  -She is to remain in the hospital to complete the course of IV vancomycin for MRSA bacteremia given that she is an IV drug user requires supervision.   Poor dental hygiene  multiple dental caries, periapical abscess -Dr. Kristin Bruins  reviewed her orthopantogram and felt that the patient needs multiple extractions and requested preoperative clearance to see if she was safe for general anesthesia -3/1, patient was seen by dentist Dr. Kristin Bruins to evaluate poor dentition as a possible source of infection for this patient. -3/5, patient underwent extraction of multiple teeth extraction as well as alveoloplasty.   History of IV drug abuse -Currently on methadone 20 mg daily, gabapentin 300 mg twice daily, ibuprofen as needed every 6 hours. -After the procedure on 3/5, patient has been on IV Diladuid 0.5mg  q4hr PRN.  We will taper pain medicines.   Anemia of chronic disease -Status post 1 unit PRBC transfusion as well as IV iron therapy.   -Continue ferrous sulfate.  Insomnia/sleep disturbance -Switched back to Trazodone 50 mg p.o. nightly as needed  E. Coli UTI - POA -Completed course of IV Rocephin.  Leg Cramps -Continue with Gabapentin 300 mg po BID  Hypomagnesemia/hypophosphatemia -Improved with replacement.  TobaccoAbuse -Patient states that she smokes 1  pack/day -Continue nicotine patch 14 mg transdermal daily -Smoking cessation counseling given  Nutrition Problem: Increased nutrient needs Etiology: post-op healing      DVT prophylaxis: SCDs.  Refused Lovenox Code Status: Full code Family Communication: None present at the bedside Disposition Plan: Patient is from home.  She needs IV antibiotics till 3/12.  Anticipate discharge to home after completion of antibiotics   Consultants: Urology, ID, orthopedics, cardiothoracic surgery, dentistry  Procedures: As above  Antimicrobials:  Anti-infectives (From admission, onward)   Start     Dose/Rate Route Frequency Ordered Stop   06/23/19 0730  ceFAZolin (ANCEF) powder 2 g     2 g Other To Surgery 06/20/19 1302 06/24/19 0730   06/19/19 2300  vancomycin (VANCOCIN) IVPB 1000 mg/200 mL premix  Status:  Discontinued     1,000 mg 200 mL/hr over 60 Minutes Intravenous Every 12 hours 06/19/19 2156 06/19/19 2159   06/19/19 2300  vancomycin (VANCOREADY) IVPB 750 mg/150 mL     750 mg 150 mL/hr over 60 Minutes Intravenous Every 12 hours 06/19/19 2159     06/19/19 2200  vancomycin (VANCOREADY) IVPB 750 mg/150 mL  Status:  Discontinued     750 mg 150 mL/hr over 60 Minutes Intravenous Every 12 hours 06/19/19 1448 06/19/19 2156   06/18/19 2200  vancomycin (VANCOREADY) IVPB 750 mg/150 mL  Status:  Discontinued     750 mg 150 mL/hr over 60 Minutes Intravenous Every 12 hours 06/18/19 1221 06/18/19 1225   06/18/19 2100  vancomycin (VANCOREADY) IVPB 750 mg/150 mL  Status:  Discontinued     750 mg 150 mL/hr over 60 Minutes Intravenous Every 12 hours 06/18/19 1225 06/19/19 1448   06/09/19 0800  vancomycin (VANCOREADY) IVPB 750 mg/150 mL  Status:  Discontinued     750 mg 150 mL/hr over 60 Minutes Intravenous Every 12 hours 06/08/19 1840 06/18/19 1221   06/03/19 1800  vancomycin (VANCOCIN) IVPB 1000 mg/200 mL premix  Status:  Discontinued     1,000 mg 200 mL/hr over 60 Minutes Intravenous Every 12  hours 06/03/19 0456 06/08/19 1841   05/31/19 0200  vancomycin (VANCOREADY) IVPB 750 mg/150 mL  Status:  Discontinued     750 mg 150 mL/hr over 60 Minutes Intravenous Every 12 hours 05/31/19 0126 06/03/19 0456   05/30/19 2200  vancomycin (VANCOREADY) IVPB 750 mg/150 mL  Status:  Discontinued     750 mg 150 mL/hr over 60 Minutes Intravenous Every 12 hours 05/30/19 1207 05/30/19 2126   05/25/19 1230  amoxicillin (AMOXIL) capsule 500 mg     500 mg Oral  Once 05/25/19 1120 05/25/19 1435   05/21/19 1000  vancomycin (VANCOCIN) IVPB 1000 mg/200 mL premix  Status:  Discontinued     1,000 mg 200 mL/hr over 60 Minutes Intravenous Every 12 hours 05/21/19 0948 05/30/19 1207   05/20/19 1400  cefTRIAXone (ROCEPHIN) 2 g in sodium chloride 0.9 % 100 mL IVPB  Status:  Discontinued     2 g 200 mL/hr over 30 Minutes Intravenous Every 24 hours 05/20/19 1328 05/22/19 0947   05/19/19 2200  levofloxacin (LEVAQUIN) IVPB 750 mg  Status:  Discontinued     750 mg 100 mL/hr over 90 Minutes Intravenous Every 24 hours 05/19/19 0212 05/19/19 0213   05/19/19 2200  levofloxacin (LEVAQUIN) IVPB 500 mg  Status:  Discontinued     500 mg 100 mL/hr over 60 Minutes Intravenous Every 24 hours 05/19/19 0213 05/19/19 0539   05/19/19 1000  vancomycin (VANCOREADY) IVPB 750 mg/150 mL  Status:  Discontinued     750 mg 150 mL/hr over 60 Minutes Intravenous Every 12 hours 05/19/19 0539 05/21/19 0948   05/19/19 0600  ceFEPIme (MAXIPIME) 2 g in sodium chloride 0.9 % 100 mL IVPB  Status:  Discontinued     2 g 200 mL/hr over 30 Minutes Intravenous Every 8 hours 05/19/19 0539 05/19/19 2348   05/19/19 0130  vancomycin (VANCOCIN) IVPB 1000 mg/200 mL premix     1,000 mg 200 mL/hr over 60 Minutes Intravenous  Once 05/19/19 0122 05/19/19 0253   05/18/19 2345  levofloxacin (LEVAQUIN) IVPB 750 mg     750 mg 100 mL/hr over 90 Minutes Intravenous  Once 05/18/19 2338 05/19/19 0140      Subjective: Patient seen and examined at the bedside this  morning.  She was sleeping and refusing to wake up.  Not in any kind of distress.  No new changes from yesterday.  Objective: Vitals:   06/25/19 0611 06/25/19 1355 06/25/19 2025 06/26/19 0635  BP: 108/85 114/86 119/83 123/81  Pulse: 80 96 97 88  Resp: 18 18 16 16   Temp: 98 F (36.7 C) 97.9 F (36.6 C) 98.2 F (36.8 C) 98 F (36.7 C)  TempSrc: Axillary Axillary Oral Oral  SpO2: 100% 100% 100% 100%  Weight:      Height:        Intake/Output Summary (Last 24 hours) at 06/26/2019 0816 Last data filed at 06/25/2019 2055 Gross per 24 hour  Intake 1100 ml  Output --  Net 1100 ml   Filed Weights   05/19/19 0400 05/24/19 0836 05/25/19 1728  Weight: 56.5 kg 56.5 kg 56.5 kg    Examination:  General exam: Not in distress,sleeping Refused examination  Data Reviewed: I have personally reviewed following labs and imaging studies  CBC: Recent Labs  Lab 06/19/19 1328  WBC 5.1  NEUTROABS 3.1  HGB 10.4*  HCT 33.8*  MCV 86.2  PLT 193   Basic Metabolic Panel: Recent Labs  Lab 06/19/19 1328 06/22/19 1140  NA 138 139  K 4.1 4.3  CL 104 106  CO2 22 22  GLUCOSE 103* 87  BUN 11 14  CREATININE 0.57 0.59  CALCIUM 9.1 9.5  MG 1.6*  --   PHOS 4.9*  --    GFR: Estimated Creatinine Clearance: 95 mL/min (by C-G formula based on SCr of 0.59 mg/dL). Liver Function Tests: Recent Labs  Lab 06/19/19 1328  AST 23  ALT 24  ALKPHOS 125  BILITOT 0.6  PROT 7.2  ALBUMIN 3.3*   No results for input(s): LIPASE, AMYLASE in the last 168 hours. No results for input(s): AMMONIA in the last 168 hours. Coagulation Profile: No results for input(s): INR, PROTIME in the last 168 hours. Cardiac Enzymes: No results for input(s): CKTOTAL, CKMB, CKMBINDEX, TROPONINI in the last 168 hours. BNP (last 3 results) No results for input(s): PROBNP in the last 8760 hours. HbA1C: No results for input(s): HGBA1C in the last 72 hours. CBG: No results for input(s): GLUCAP in the last 168 hours. Lipid  Profile: No results for input(s): CHOL, HDL, LDLCALC, TRIG, CHOLHDL, LDLDIRECT in the last 72 hours. Thyroid Function Tests: No results for input(s): TSH, T4TOTAL, FREET4, T3FREE, THYROIDAB in the last 72 hours. Anemia Panel: No results for input(s): VITAMINB12, FOLATE, FERRITIN, TIBC,  IRON, RETICCTPCT in the last 72 hours. Sepsis Labs: No results for input(s): PROCALCITON, LATICACIDVEN in the last 168 hours.  No results found for this or any previous visit (from the past 240 hour(s)).       Radiology Studies: No results found.      Scheduled Meds: . carvedilol  3.125 mg Oral BID WC  . feeding supplement  1 Container Oral TID BM  . ferrous sulfate  325 mg Oral BID  . gabapentin  300 mg Oral BID  . magnesium oxide  400 mg Oral BID  . methadone  20 mg Oral Daily  . nicotine  14 mg Transdermal Daily  . polyethylene glycol  17 g Oral BID  . prenatal multivitamin  1 tablet Oral Daily  . senna-docusate  1 tablet Oral BID   Continuous Infusions: . lactated ringers 10 mL/hr at 05/24/19 0902  . lactated ringers 10 mL/hr at 06/23/19 1057  . vancomycin 750 mg (06/25/19 2055)     LOS: 38 days    Time spent: 15 mins.More than 50% of that time was spent in counseling and/or coordination of care.      Burnadette Pop, MD [facility] P3/11/2019, 8:16 AM

## 2019-06-27 ENCOUNTER — Encounter (HOSPITAL_COMMUNITY): Payer: Self-pay | Admitting: Surgery

## 2019-06-27 DIAGNOSIS — B9562 Methicillin resistant Staphylococcus aureus infection as the cause of diseases classified elsewhere: Secondary | ICD-10-CM | POA: Diagnosis not present

## 2019-06-27 DIAGNOSIS — R7881 Bacteremia: Secondary | ICD-10-CM | POA: Diagnosis not present

## 2019-06-27 NOTE — Progress Notes (Signed)
Nutrition Follow-up  DOCUMENTATION CODES:   Not applicable  INTERVENTION:   -D/c Boost Breeze po TID, each supplement provides 250 kcal and 9 grams of protein -Continue MVI with minerals daily  NUTRITION DIAGNOSIS:   Increased nutrient needs related to post-op healing as evidenced by estimated needs.  Ongoing  GOAL:   Patient will meet greater than or equal to 90% of their needs  Progressing   MONITOR:   PO intake, Supplement acceptance, Diet advancement, Labs, Weight trends, Skin, I & O's  REASON FOR ASSESSMENT:   LOS    ASSESSMENT:   29 year old female with history of heroin abuse IV drug abuse hepatitis C kidney stones admitted with fever chills and back pain.  2/2- s/p TEE- revealed Tricuspid valve endocarditis with MRSA bacteremia, with likely paradoxical septic embolization (renal septic emboli) 2/4- s/p PROCEDURE:1. Right shoulder arthroscopy with irrigation, debridement, extensive debridement of the joint and subacromial spacewith subacromial bursectomy 2. Left shoulder arthroscopy with irrigation, debridement, extensive debridement of the joint and subacromial spacewith subacromial bursectomy  3/5- s/p OPERATIONS: 1. Multiple extraction of tooth numbers1, 2, 3, 16, 17, 18, 19, 20, 21, 29, 30, and 31 2.4Quadrants of alveoloplasty 3. Gross debridement of remaining dentition 3/5- advanced to regular diet   Reviewed I/O's: +2.7 L x 24 hours and +18 L since 06/13/19  Pt sleeping soundly at time of visit.   Pt tolerating regular diet well. Noted meal completion 100%. Pt is refusing Boost Breeze supplements.   Per MD notes, plan to remain inpatient until 06/30/19 for completion of antibiotics. Pt will d/c home after completion of IV antibiotics.   Labs reviewed.   Diet Order:   Diet Order            Diet regular Room service appropriate? Yes; Fluid consistency: Thin  Diet effective 1400              EDUCATION NEEDS:   No education needs have  been identified at this time  Skin:  Skin Assessment: Skin Integrity Issues: Skin Integrity Issues:: Incisions Incisions: closed rt shoulder, lt shoulder, lip  Last BM:  06/26/19  Height:   Ht Readings from Last 1 Encounters:  05/25/19 5' 7.01" (1.702 m)    Weight:   Wt Readings from Last 1 Encounters:  05/25/19 56.5 kg    Ideal Body Weight:  61.4 kg  BMI:  Body mass index is 19.5 kg/m.  Estimated Nutritional Needs:   Kcal:  1700-1900  Protein:  85-100 grams  Fluid:  > 1.7 L    Levada Schilling, RD, LDN, CDCES Registered Dietitian II Certified Diabetes Care and Education Specialist Please refer to [facility] for RD and/or RD on-call/weekend/after hours pager

## 2019-06-27 NOTE — Progress Notes (Signed)
PROGRESS NOTE    Christy Pearson  ACZ:[id number] DOB: November 23, 1990 DOA: 05/18/2019 PCP: Patient, No Pcp Per   Brief Narrative:  Patient is a 29 year old female with history of polysubstance abuse including heroin, crystal meth, hepatitis C open to the emergency department on 1/28 with complaints of fever, chest pain, cough, flank pain, weakness.  She was febrile on presentation.  CT imaging showed bilateral cavitary nodules, pneumonia concerning for septic emboli.  Blood cultures came came  positive for MRSA.  TEE noted tricuspid valve endocarditis.  She was also found to have bilateral shoulder, spine infection. 2/4, she underwent arthroscopy, irrigation, debridement of both shoulder joints by orthopedic surgery. ID recommended 6 weeks of IV vancomycin.  PICC line placed on 2/14.  Patient will complete the course of IV vancomycin on 06/30/2019.  She is to remain in the hospital to complete the course of IV vancomycin for MRSA bacteremia given that she is an IV drug user requires supervision.  Her hospital course has also been notable for a blood transfusion and IV iron secondary to anemia of chronic disease. She also had a UTI which was treated and a PICC line was placed for long-term IV antibiotics on 07/02/2019. 3/1, patient was seen by dentist Dr. Kristin Bruins to evaluate poor dentition as a possible source of infection for this patient. 3/5, patient underwent extraction of multiple teeth extraction as well as alveoloplasty.  Assessment & Plan:   Principal Problem:   MRSA bacteremia Active Problems:   Injection of illicit drug within last 12 months   Hepatitis C antibody positive, s/p spontaneously cleared infection   Sepsis (HCC)   Septic pulmonary embolism (HCC)   Substance use disorder   Suspected endocarditis   IV drug abuse (HCC)   Septic embolism (HCC)   Staphylococcal arthritis of right shoulder (HCC)   Staphylococcal arthritis of left wrist (HCC)   PFO (patent foramen ovale)   Endocarditis of tricuspid valve   Septic arthritis of shoulder, left (HCC)   Septic arthritis of shoulder, right (HCC)   Elevated LFTs  MRSA bacteremiasecondary to IV drug use Tricuspid valve endocarditis,  multilevel vertebral osteomyelitis,  septic pulmonary emboli,  bilateral septic shoulder arthritis suspected left wrist septic arthrits Severe sepsis - POA, resolved. -2/4, she underwent arthroscopy, irrigation, debridement of both shoulder joints by orthopedic surgery. -ID recommended 6 weeks of IV vancomycin. PICC line placed on 2/14.  -Patient will complete the course of IV vancomycin on 06/30/2019.  -She is to remain in the hospital to complete the course of IV vancomycin for MRSA bacteremia given that she is an IV drug user requires supervision.   Poor dental hygiene  multiple dental caries, periapical abscess -Dr. Kristin Bruins  reviewed her orthopantogram and felt that the patient needs multiple extractions and requested preoperative clearance to see if she was safe for general anesthesia -3/1, patient was seen by dentist Dr. Kristin Bruins to evaluate poor dentition as a possible source of infection for this patient. -3/5, patient underwent extraction of multiple teeth extraction as well as alveoloplasty.   History of IV drug abuse -Currently on methadone 20 mg daily, gabapentin 300 mg twice daily, ibuprofen as needed every 6 hours. -After the procedure on 3/5, patient has been on IV Diladuid 0.5mg  q4hr PRN.  We will taper pain medicines.   Anemia of chronic disease -Status post 1 unit PRBC transfusion as well as IV iron therapy.   -Continue ferrous sulfate.  Insomnia/sleep disturbance -Switched back to Trazodone 50 mg p.o. nightly as needed  E. Coli UTI - POA -Completed course of IV Rocephin.  Leg Cramps -Continue with Gabapentin 300 mg po BID  Hypomagnesemia/hypophosphatemia -Improved with replacement.  TobaccoAbuse -Patient states that she smokes 1  pack/day -Continue nicotine patch 14 mg transdermal daily -Smoking cessation counseling given  Nutrition Problem: Increased nutrient needs Etiology: post-op healing      DVT prophylaxis: SCDs.  Refused Lovenox Code Status: Full code Family Communication: None present at the bedside Disposition Plan: Patient is from home.  She needs IV antibiotics till 3/12.  Anticipate discharge to home after completion of antibiotics   Consultants: Urology, ID, orthopedics, cardiothoracic surgery, dentistry  Procedures: As above  Antimicrobials:  Anti-infectives (From admission, onward)   Start     Dose/Rate Route Frequency Ordered Stop   06/23/19 0730  ceFAZolin (ANCEF) powder 2 g     2 g Other To Surgery 06/20/19 1302 06/24/19 0730   06/19/19 2300  vancomycin (VANCOCIN) IVPB 1000 mg/200 mL premix  Status:  Discontinued     1,000 mg 200 mL/hr over 60 Minutes Intravenous Every 12 hours 06/19/19 2156 06/19/19 2159   06/19/19 2300  vancomycin (VANCOREADY) IVPB 750 mg/150 mL     750 mg 150 mL/hr over 60 Minutes Intravenous Every 12 hours 06/19/19 2159     06/19/19 2200  vancomycin (VANCOREADY) IVPB 750 mg/150 mL  Status:  Discontinued     750 mg 150 mL/hr over 60 Minutes Intravenous Every 12 hours 06/19/19 1448 06/19/19 2156   06/18/19 2200  vancomycin (VANCOREADY) IVPB 750 mg/150 mL  Status:  Discontinued     750 mg 150 mL/hr over 60 Minutes Intravenous Every 12 hours 06/18/19 1221 06/18/19 1225   06/18/19 2100  vancomycin (VANCOREADY) IVPB 750 mg/150 mL  Status:  Discontinued     750 mg 150 mL/hr over 60 Minutes Intravenous Every 12 hours 06/18/19 1225 06/19/19 1448   06/09/19 0800  vancomycin (VANCOREADY) IVPB 750 mg/150 mL  Status:  Discontinued     750 mg 150 mL/hr over 60 Minutes Intravenous Every 12 hours 06/08/19 1840 06/18/19 1221   06/03/19 1800  vancomycin (VANCOCIN) IVPB 1000 mg/200 mL premix  Status:  Discontinued     1,000 mg 200 mL/hr over 60 Minutes Intravenous Every 12  hours 06/03/19 0456 06/08/19 1841   05/31/19 0200  vancomycin (VANCOREADY) IVPB 750 mg/150 mL  Status:  Discontinued     750 mg 150 mL/hr over 60 Minutes Intravenous Every 12 hours 05/31/19 0126 06/03/19 0456   05/30/19 2200  vancomycin (VANCOREADY) IVPB 750 mg/150 mL  Status:  Discontinued     750 mg 150 mL/hr over 60 Minutes Intravenous Every 12 hours 05/30/19 1207 05/30/19 2126   05/25/19 1230  amoxicillin (AMOXIL) capsule 500 mg     500 mg Oral  Once 05/25/19 1120 05/25/19 1435   05/21/19 1000  vancomycin (VANCOCIN) IVPB 1000 mg/200 mL premix  Status:  Discontinued     1,000 mg 200 mL/hr over 60 Minutes Intravenous Every 12 hours 05/21/19 0948 05/30/19 1207   05/20/19 1400  cefTRIAXone (ROCEPHIN) 2 g in sodium chloride 0.9 % 100 mL IVPB  Status:  Discontinued     2 g 200 mL/hr over 30 Minutes Intravenous Every 24 hours 05/20/19 1328 05/22/19 0947   05/19/19 2200  levofloxacin (LEVAQUIN) IVPB 750 mg  Status:  Discontinued     750 mg 100 mL/hr over 90 Minutes Intravenous Every 24 hours 05/19/19 0212 05/19/19 0213   05/19/19 2200  levofloxacin (LEVAQUIN) IVPB 500 mg  Status:  Discontinued     500 mg 100 mL/hr over 60 Minutes Intravenous Every 24 hours 05/19/19 0213 05/19/19 0539   05/19/19 1000  vancomycin (VANCOREADY) IVPB 750 mg/150 mL  Status:  Discontinued     750 mg 150 mL/hr over 60 Minutes Intravenous Every 12 hours 05/19/19 0539 05/21/19 0948   05/19/19 0600  ceFEPIme (MAXIPIME) 2 g in sodium chloride 0.9 % 100 mL IVPB  Status:  Discontinued     2 g 200 mL/hr over 30 Minutes Intravenous Every 8 hours 05/19/19 0539 05/19/19 2348   05/19/19 0130  vancomycin (VANCOCIN) IVPB 1000 mg/200 mL premix     1,000 mg 200 mL/hr over 60 Minutes Intravenous  Once 05/19/19 0122 05/19/19 0253   05/18/19 2345  levofloxacin (LEVAQUIN) IVPB 750 mg     750 mg 100 mL/hr over 90 Minutes Intravenous  Once 05/18/19 2338 05/19/19 0140      Subjective:  Patient seen and examined at the bedside  this morning.  Comfortable.  No new changes since yesterday. Objective: Vitals:   06/26/19 0635 06/26/19 1434 06/26/19 1905 06/27/19 0456  BP: 123/81 102/67 121/80 106/72  Pulse: 88 (!) 101 (!) 101 97  Resp: 16 18 18 16   Temp: 98 F (36.7 C) 98.4 F (36.9 C) 98.2 F (36.8 C) 98.6 F (37 C)  TempSrc: Oral Oral Oral Oral  SpO2: 100% 99% 100% 99%  Weight:      Height:        Intake/Output Summary (Last 24 hours) at 06/27/2019 1146 Last data filed at 06/27/2019 0400 Gross per 24 hour  Intake 2230 ml  Output --  Net 2230 ml   Filed Weights   05/19/19 0400 05/24/19 0836 05/25/19 1728  Weight: 56.5 kg 56.5 kg 56.5 kg    Examination:  General exam: Not in distress,sleeping as always,comfortable   Data Reviewed: I have personally reviewed following labs and imaging studies  CBC: No results for input(s): WBC, NEUTROABS, HGB, HCT, MCV, PLT in the last 168 hours. Basic Metabolic Panel: Recent Labs  Lab 06/22/19 1140  NA 139  K 4.3  CL 106  CO2 22  GLUCOSE 87  BUN 14  CREATININE 0.59  CALCIUM 9.5   GFR: Estimated Creatinine Clearance: 95 mL/min (by C-G formula based on SCr of 0.59 mg/dL). Liver Function Tests: No results for input(s): AST, ALT, ALKPHOS, BILITOT, PROT, ALBUMIN in the last 168 hours. No results for input(s): LIPASE, AMYLASE in the last 168 hours. No results for input(s): AMMONIA in the last 168 hours. Coagulation Profile: No results for input(s): INR, PROTIME in the last 168 hours. Cardiac Enzymes: No results for input(s): CKTOTAL, CKMB, CKMBINDEX, TROPONINI in the last 168 hours. BNP (last 3 results) No results for input(s): PROBNP in the last 8760 hours. HbA1C: No results for input(s): HGBA1C in the last 72 hours. CBG: No results for input(s): GLUCAP in the last 168 hours. Lipid Profile: No results for input(s): CHOL, HDL, LDLCALC, TRIG, CHOLHDL, LDLDIRECT in the last 72 hours. Thyroid Function Tests: No results for input(s): TSH, T4TOTAL,  FREET4, T3FREE, THYROIDAB in the last 72 hours. Anemia Panel: No results for input(s): VITAMINB12, FOLATE, FERRITIN, TIBC, IRON, RETICCTPCT in the last 72 hours. Sepsis Labs: No results for input(s): PROCALCITON, LATICACIDVEN in the last 168 hours.  No results found for this or any previous visit (from the past 240 hour(s)).       Radiology Studies: No results found.      Scheduled Meds: . carvedilol  3.125 mg Oral BID WC  . feeding supplement  1 Container Oral TID BM  . ferrous sulfate  325 mg Oral BID  . gabapentin  300 mg Oral BID  . magnesium oxide  400 mg Oral BID  . methadone  20 mg Oral Daily  . nicotine  14 mg Transdermal Daily  . polyethylene glycol  17 g Oral BID  . prenatal multivitamin  1 tablet Oral Daily  . senna-docusate  1 tablet Oral BID   Continuous Infusions: . lactated ringers 10 mL/hr at 05/24/19 0902  . lactated ringers 10 mL/hr at 06/23/19 1057  . vancomycin 750 mg (06/27/19 1036)     LOS: 39 days    Time spent: 15 mins.More than 50% of that time was spent in counseling and/or coordination of care.      Burnadette Pop, MD [facility] P3/12/2019, 11:46 AM

## 2019-06-28 DIAGNOSIS — B9562 Methicillin resistant Staphylococcus aureus infection as the cause of diseases classified elsewhere: Secondary | ICD-10-CM | POA: Diagnosis not present

## 2019-06-28 DIAGNOSIS — R7881 Bacteremia: Secondary | ICD-10-CM | POA: Diagnosis not present

## 2019-06-28 NOTE — Progress Notes (Signed)
PROGRESS NOTE    Christy Pearson  KVQ:[id number] DOB: 1990/05/11 DOA: 05/18/2019 PCP: Patient, No Pcp Per   Brief Narrative:  Patient is a 29 year old female with history of polysubstance abuse including heroin, crystal meth, hepatitis C open to the emergency department on 1/28 with complaints of fever, chest pain, cough, flank pain, weakness.  She was febrile on presentation.  CT imaging showed bilateral cavitary nodules, pneumonia concerning for septic emboli.  Blood cultures came came  positive for MRSA.  TEE noted tricuspid valve endocarditis.  She was also found to have bilateral shoulder, spine infection. 2/4, she underwent arthroscopy, irrigation, debridement of both shoulder joints by orthopedic surgery. ID recommended 6 weeks of IV vancomycin.  PICC line placed on 2/14.  Patient will complete the course of IV vancomycin on 06/30/2019.  She is to remain in the hospital to complete the course of IV vancomycin for MRSA bacteremia given that she is an IV drug user requires supervision.  Her hospital course has also been notable for a blood transfusion and IV iron secondary to anemia of chronic disease. She also had a UTI which was treated and a PICC line was placed for long-term IV antibiotics on 07/02/2019. On 3/1, patient was seen by dentist Dr. Enrique Sack to evaluate poor dentition as a possible source of infection for this patient. On 3/5, patient underwent extraction of multiple teeth extraction as well as alveoloplasty. Waiting for completion of antibiotics.  Assessment & Plan:   Principal Problem:   MRSA bacteremia Active Problems:   Injection of illicit drug within last 12 months   Hepatitis C antibody positive, s/p spontaneously cleared infection   Sepsis ([place])   Septic pulmonary embolism ([place])   Substance use disorder   Suspected endocarditis   IV drug abuse ([place])   Septic embolism (HCC)   Staphylococcal arthritis of right shoulder (HCC)   Staphylococcal arthritis of  left wrist (HCC)   PFO (patent foramen ovale)   Endocarditis of tricuspid valve   Septic arthritis of shoulder, left (HCC)   Septic arthritis of shoulder, right (HCC)   Elevated LFTs  MRSA bacteremiasecondary to IV drug use Tricuspid valve endocarditis,  multilevel vertebral osteomyelitis,  septic pulmonary emboli,  bilateral septic shoulder arthritis suspected left wrist septic arthrits Severe sepsis - POA, resolved. -2/4, she underwent arthroscopy, irrigation, debridement of both shoulder joints by orthopedic surgery. -ID recommended 6 weeks of IV vancomycin. PICC line placed on 2/14.  -Patient will complete the course of IV vancomycin on 06/30/2019.  -She is to remain in the hospital to complete the course of IV vancomycin for MRSA bacteremia given that she is an IV drug user requires supervision.   Poor dental hygiene  multiple dental caries, periapical abscess -Dr. Enrique Sack  reviewed her orthopantogram and felt that the patient needs multiple extractions and requested preoperative clearance to see if she was safe for general anesthesia -3/1, patient was seen by dentist Dr. Enrique Sack to evaluate poor dentition as a possible source of infection for this patient. -3/5, patient underwent extraction of multiple teeth extraction as well as alveoloplasty.   History of IV drug abuse -Currently on methadone 20 mg daily, gabapentin 300 mg twice daily, ibuprofen as needed every 6 hours. -After the procedure on 3/5, patient has been on IV Diladuid 0.5mg  q4hr PRN.  We will taper pain medicines.   Anemia of chronic disease -Status post 1 unit PRBC transfusion as well as IV iron therapy.   -Continue ferrous sulfate.  Insomnia/sleep disturbance -Switched back to  Trazodone 50 mg p.o. nightly as needed  E. Coli UTI - POA -Completed course of IV Rocephin.  Leg Cramps -Continue with Gabapentin 300 mg po BID  Hypomagnesemia/hypophosphatemia -Improved with  replacement.  TobaccoAbuse -Patient states that she smokes 1 pack/day -Continue nicotine patch 14 mg transdermal daily -Smoking cessation counseling given  Nutrition Problem: Increased nutrient needs Etiology: post-op healing      DVT prophylaxis: SCDs.  Refused Lovenox Code Status: Full code Family Communication: None present at the bedside Disposition Plan: Patient is from home.  She needs IV antibiotics till 3/12.  Anticipate discharge to home after completion of antibiotics   Consultants: Urology, ID, orthopedics, cardiothoracic surgery, dentistry  Procedures: As above  Antimicrobials:  Anti-infectives (From admission, onward)   Start     Dose/Rate Route Frequency Ordered Stop   06/23/19 0730  ceFAZolin (ANCEF) powder 2 g     2 g Other To Surgery 06/20/19 1302 06/24/19 0730   06/19/19 2300  vancomycin (VANCOCIN) IVPB 1000 mg/200 mL premix  Status:  Discontinued     1,000 mg 200 mL/hr over 60 Minutes Intravenous Every 12 hours 06/19/19 2156 06/19/19 2159   06/19/19 2300  vancomycin (VANCOREADY) IVPB 750 mg/150 mL     750 mg 150 mL/hr over 60 Minutes Intravenous Every 12 hours 06/19/19 2159     06/19/19 2200  vancomycin (VANCOREADY) IVPB 750 mg/150 mL  Status:  Discontinued     750 mg 150 mL/hr over 60 Minutes Intravenous Every 12 hours 06/19/19 1448 06/19/19 2156   06/18/19 2200  vancomycin (VANCOREADY) IVPB 750 mg/150 mL  Status:  Discontinued     750 mg 150 mL/hr over 60 Minutes Intravenous Every 12 hours 06/18/19 1221 06/18/19 1225   06/18/19 2100  vancomycin (VANCOREADY) IVPB 750 mg/150 mL  Status:  Discontinued     750 mg 150 mL/hr over 60 Minutes Intravenous Every 12 hours 06/18/19 1225 06/19/19 1448   06/09/19 0800  vancomycin (VANCOREADY) IVPB 750 mg/150 mL  Status:  Discontinued     750 mg 150 mL/hr over 60 Minutes Intravenous Every 12 hours 06/08/19 1840 06/18/19 1221   06/03/19 1800  vancomycin (VANCOCIN) IVPB 1000 mg/200 mL premix  Status:  Discontinued      1,000 mg 200 mL/hr over 60 Minutes Intravenous Every 12 hours 06/03/19 0456 06/08/19 1841   05/31/19 0200  vancomycin (VANCOREADY) IVPB 750 mg/150 mL  Status:  Discontinued     750 mg 150 mL/hr over 60 Minutes Intravenous Every 12 hours 05/31/19 0126 06/03/19 0456   05/30/19 2200  vancomycin (VANCOREADY) IVPB 750 mg/150 mL  Status:  Discontinued     750 mg 150 mL/hr over 60 Minutes Intravenous Every 12 hours 05/30/19 1207 05/30/19 2126   05/25/19 1230  amoxicillin (AMOXIL) capsule 500 mg     500 mg Oral  Once 05/25/19 1120 05/25/19 1435   05/21/19 1000  vancomycin (VANCOCIN) IVPB 1000 mg/200 mL premix  Status:  Discontinued     1,000 mg 200 mL/hr over 60 Minutes Intravenous Every 12 hours 05/21/19 0948 05/30/19 1207   05/20/19 1400  cefTRIAXone (ROCEPHIN) 2 g in sodium chloride 0.9 % 100 mL IVPB  Status:  Discontinued     2 g 200 mL/hr over 30 Minutes Intravenous Every 24 hours 05/20/19 1328 05/22/19 0947   05/19/19 2200  levofloxacin (LEVAQUIN) IVPB 750 mg  Status:  Discontinued     750 mg 100 mL/hr over 90 Minutes Intravenous Every 24 hours 05/19/19 0212 05/19/19 1700  05/19/19 2200  levofloxacin (LEVAQUIN) IVPB 500 mg  Status:  Discontinued     500 mg 100 mL/hr over 60 Minutes Intravenous Every 24 hours 05/19/19 0213 05/19/19 0539   05/19/19 1000  vancomycin (VANCOREADY) IVPB 750 mg/150 mL  Status:  Discontinued     750 mg 150 mL/hr over 60 Minutes Intravenous Every 12 hours 05/19/19 0539 05/21/19 0948   05/19/19 0600  ceFEPIme (MAXIPIME) 2 g in sodium chloride 0.9 % 100 mL IVPB  Status:  Discontinued     2 g 200 mL/hr over 30 Minutes Intravenous Every 8 hours 05/19/19 0539 05/19/19 2348   05/19/19 0130  vancomycin (VANCOCIN) IVPB 1000 mg/200 mL premix     1,000 mg 200 mL/hr over 60 Minutes Intravenous  Once 05/19/19 0122 05/19/19 0253   05/18/19 2345  levofloxacin (LEVAQUIN) IVPB 750 mg     750 mg 100 mL/hr over 90 Minutes Intravenous  Once 05/18/19 2338 05/19/19 0140       Subjective:  Patient seen and examined the bedside this morning.  Hemodynamically stable.  No new complaints.  Comfortable.  She was asking for methadone prescription on discharge.  Objective: Vitals:   06/27/19 0456 06/27/19 1534 06/27/19 1915 06/28/19 0500  BP: 106/72 (!) 122/91 118/78 124/85  Pulse: 97 (!) 110 (!) 105 (!) 103  Resp: 16 16 17 17   Temp: 98.6 F (37 C) 98.5 F (36.9 C) 98.1 F (36.7 C) 98 F (36.7 C)  TempSrc: Oral Oral Oral Oral  SpO2: 99% 100% 99% 100%  Weight:      Height:        Intake/Output Summary (Last 24 hours) at 06/28/2019 1258 Last data filed at 06/28/2019 0900 Gross per 24 hour  Intake 1575.08 ml  Output --  Net 1575.08 ml   Filed Weights   05/19/19 0400 05/24/19 0836 05/25/19 1728  Weight: 56.5 kg 56.5 kg 56.5 kg    Examination:  General exam: Lying on bed ,comfortable   Data Reviewed: I have personally reviewed following labs and imaging studies  CBC: No results for input(s): WBC, NEUTROABS, HGB, HCT, MCV, PLT in the last 168 hours. Basic Metabolic Panel: Recent Labs  Lab 06/22/19 1140  NA 139  K 4.3  CL 106  CO2 22  GLUCOSE 87  BUN 14  CREATININE 0.59  CALCIUM 9.5   GFR: Estimated Creatinine Clearance: 95 mL/min (by C-G formula based on SCr of 0.59 mg/dL). Liver Function Tests: No results for input(s): AST, ALT, ALKPHOS, BILITOT, PROT, ALBUMIN in the last 168 hours. No results for input(s): LIPASE, AMYLASE in the last 168 hours. No results for input(s): AMMONIA in the last 168 hours. Coagulation Profile: No results for input(s): INR, PROTIME in the last 168 hours. Cardiac Enzymes: No results for input(s): CKTOTAL, CKMB, CKMBINDEX, TROPONINI in the last 168 hours. BNP (last 3 results) No results for input(s): PROBNP in the last 8760 hours. HbA1C: No results for input(s): HGBA1C in the last 72 hours. CBG: No results for input(s): GLUCAP in the last 168 hours. Lipid Profile: No results for input(s): CHOL, HDL,  LDLCALC, TRIG, CHOLHDL, LDLDIRECT in the last 72 hours. Thyroid Function Tests: No results for input(s): TSH, T4TOTAL, FREET4, T3FREE, THYROIDAB in the last 72 hours. Anemia Panel: No results for input(s): VITAMINB12, FOLATE, FERRITIN, TIBC, IRON, RETICCTPCT in the last 72 hours. Sepsis Labs: No results for input(s): PROCALCITON, LATICACIDVEN in the last 168 hours.  No results found for this or any previous visit (from the past 240 hour(s)).  Radiology Studies: No results found.      Scheduled Meds: . carvedilol  3.125 mg Oral BID WC  . ferrous sulfate  325 mg Oral BID  . gabapentin  300 mg Oral BID  . magnesium oxide  400 mg Oral BID  . methadone  20 mg Oral Daily  . nicotine  14 mg Transdermal Daily  . polyethylene glycol  17 g Oral BID  . prenatal multivitamin  1 tablet Oral Daily  . senna-docusate  1 tablet Oral BID   Continuous Infusions: . lactated ringers 10 mL/hr at 05/24/19 0902  . lactated ringers 10 mL/hr at 06/23/19 1057  . vancomycin 750 mg (06/28/19 1001)     LOS: 40 days    Time spent: 15 mins.More than 50% of that time was spent in counseling and/or coordination of care.      Burnadette Pop, MD [facility] P3/01/2020, 12:58 PM

## 2019-06-29 DIAGNOSIS — R7881 Bacteremia: Secondary | ICD-10-CM | POA: Diagnosis not present

## 2019-06-29 DIAGNOSIS — B9562 Methicillin resistant Staphylococcus aureus infection as the cause of diseases classified elsewhere: Secondary | ICD-10-CM | POA: Diagnosis not present

## 2019-06-29 NOTE — Progress Notes (Signed)
Occupational Therapy Treatment and DISCHARGE  Patient Details Name: Christy Pearson MRN: [id number] DOB: [birth year] Today's Date: 06/29/2019    History of present illness Christy Pearson is a 29 y/o female admitted with fevere chills and back pain. 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 PMH hepatitis C, Kidney stones, IV drug abuse,    OT comments  Patient progressing well.  Has met 2/2 OT goals, demonstrating ability to functionally reach overhead into cabinets with BUEs. Provided new HEP and continued to reinforce education of full ROM and tolerance of 10-15 reps prior to adding strength training--Christy Pearson verbalized understanding.  Reviewed ROM exercises on HEP ([id number]) supine and standing-- educated on not pushing past pain, progressing ROM slowly to tolerance.  Christy Pearson reports muscle soreness but no pain.  Based on performance today, no further OT needs identified. Encouraged normal functional use of UEs. OT signing off and Christy Pearson agreeable.     Follow Up Recommendations  No OT follow up    Equipment Recommendations  None recommended by OT    Recommendations for Other Services      Precautions / Restrictions Precautions Precautions: None Restrictions RUE Weight Bearing: Weight bearing as tolerated LUE Weight Bearing: Weight bearing as tolerated       Mobility Bed Mobility Overal bed mobility: Independent                Transfers Overall transfer level: Independent                    Balance Overall balance assessment: No apparent balance deficits (not formally assessed)                                         ADL either performed or assessed with clinical judgement   ADL Overall ADL's : Modified independent                                     Functional mobility during ADLs:  Independent General ADL Comments: demonstrates ability to reach in overhead cabinents with B UEs to retreive items with independence     Vision       Perception     Praxis      Cognition Arousal/Alertness: Awake/alert Behavior During Therapy: WFL for tasks assessed/performed Overall Cognitive Status: Within Functional Limits for tasks assessed                                          Exercises Exercises: Other exercises;General Upper Extremity General Exercises - Upper Extremity Shoulder Flexion: AROM;Both;10 reps;Standing Shoulder ABduction: AROM;Both;10 reps;Standing Shoulder Exercises Shoulder External Rotation: AROM;10 reps;Both;Supine Other Exercises Other Exercises: Completed 10 reps 1 set of: scaption standing, internal rotation supine    Shoulder Instructions       General Comments Provided new HEP to progress shoulder ROM and strength ([id number]). Educated on ROM initally and progressing to strength training using bands.     Pertinent Vitals/ Pain       Pain Assessment: No/denies pain  Home Living  Prior Functioning/Environment              Frequency           Progress Toward Goals  OT Goals(current goals can now be found in the care plan section)  Progress towards OT goals: Goals met/education completed, patient discharged from [place] All goals met and education completed, patient discharged from OT services    Co-evaluation                 AM-PAC OT "6 Clicks" Daily Activity     Outcome Measure   Help from another person eating meals?: None Help from another person taking care of personal grooming?: None Help from another person toileting, which includes using toliet, bedpan, or urinal?: None Help from another person bathing (including washing, rinsing, drying)?: None Help from another person to put on and taking off regular upper body clothing?:  None Help from another person to put on and taking off regular lower body clothing?: None 6 Click Score: 24    End of Session    OT Visit Diagnosis: Muscle weakness (generalized) (M62.81)   Activity Tolerance Patient tolerated treatment well   Patient Left Other (comment)(with RN)   Nurse Communication          Time: 7482-7078 OT Time Calculation (min): 25 min  Charges: OT General Charges $OT Visit: 1 Visit OT Treatments $Self Care/Home Management : 8-22 mins $Therapeutic Exercise: 8-22 mins  Jolaine Artist, OT Acute Rehabilitation Services Pager [phone] Office [phone]     [name] 06/29/2019, 1:10 PM

## 2019-06-29 NOTE — Progress Notes (Signed)
PROGRESS NOTE    Christy Pearson  FIE:[id number] DOB: 07-12-1990 DOA: 05/18/2019 PCP: Patient, No Pcp Per   Brief Narrative:  Patient is a 29 year old female with history of polysubstance abuse including heroin, crystal meth, hepatitis C open to the emergency department on 1/28 with complaints of fever, chest pain, cough, flank pain, weakness.  She was febrile on presentation.  CT imaging showed bilateral cavitary nodules, pneumonia concerning for septic emboli.  Blood cultures came came  positive for MRSA.  TEE noted tricuspid valve endocarditis.  She was also found to have bilateral shoulder, spine infection. 2/4, she underwent arthroscopy, irrigation, debridement of both shoulder joints by orthopedic surgery. ID recommended 6 weeks of IV vancomycin.  PICC line placed on 2/14.  Patient will complete the course of IV vancomycin on 06/30/2019.  She is to remain in the hospital to complete the course of IV vancomycin for MRSA bacteremia given that she is an IV drug user requires supervision.  Her hospital course has also been notable for a blood transfusion and IV iron secondary to anemia of chronic disease. She also had a UTI which was treated and a PICC line was placed for long-term IV antibiotics. On 3/1, patient was seen by dentist Dr. Kristin Bruins to evaluate poor dentition as a possible source of infection for this patient. On 3/5, patient underwent extraction of multiple teeth extraction as well as alveoloplasty. Waiting for completion of antibiotics. DC planning tomorrow  Assessment & Plan:   Principal Problem:   MRSA bacteremia Active Problems:   Injection of illicit drug within last 12 months   Hepatitis C antibody positive, s/p spontaneously cleared infection   Sepsis (HCC)   Septic pulmonary embolism (HCC)   Substance use disorder   Suspected endocarditis   IV drug abuse (HCC)   Septic embolism (HCC)   Staphylococcal arthritis of right shoulder (HCC)   Staphylococcal  arthritis of left wrist (HCC)   PFO (patent foramen ovale)   Endocarditis of tricuspid valve   Septic arthritis of shoulder, left (HCC)   Septic arthritis of shoulder, right (HCC)   Elevated LFTs  MRSA bacteremiasecondary to IV drug use Tricuspid valve endocarditis,  multilevel vertebral osteomyelitis,  septic pulmonary emboli,  bilateral septic shoulder arthritis suspected left wrist septic arthrits Severe sepsis - POA, resolved. -2/4, she underwent arthroscopy, irrigation, debridement of both shoulder joints by orthopedic surgery. -ID recommended 6 weeks of IV vancomycin. PICC line placed on 2/14.  -Patient will complete the course of IV vancomycin on 06/30/2019.  -She is to remain in the hospital to complete the course of IV vancomycin for MRSA bacteremia given that she is an IV drug user requires supervision.   Poor dental hygiene  multiple dental caries, periapical abscess -Dr. Kristin Bruins  reviewed her orthopantogram and felt that the patient needs multiple extractions and requested preoperative clearance to see if she was safe for general anesthesia -3/1, patient was seen by dentist Dr. Kristin Bruins to evaluate poor dentition as a possible source of infection for this patient. -3/5, patient underwent extraction of multiple teeth extraction as well as alveoloplasty.   History of IV drug abuse -Currently on methadone 20 mg daily, gabapentin 300 mg twice daily, ibuprofen as needed every 6 hours. -After the procedure on 3/5, patient has been on IV Diladuid 0.5mg  q4hr PRN.  We will taper pain medicines.   Anemia of chronic disease -Status post 1 unit PRBC transfusion as well as IV iron therapy.   -Continue ferrous sulfate.  Insomnia/sleep disturbance -Switched back  to Trazodone 50 mg p.o. nightly as needed  E. Coli UTI - POA -Completed course of IV Rocephin.  Leg Cramps -Continue with Gabapentin 300 mg po BID  Hypomagnesemia/hypophosphatemia -Improved with  replacement.  TobaccoAbuse -Patient states that she smokes 1 pack/day -Continue nicotine patch 14 mg transdermal daily -Smoking cessation counseling given  Nutrition Problem: Increased nutrient needs Etiology: post-op healing      DVT prophylaxis: SCDs.  Refused Lovenox Code Status: Full code Family Communication: None present at the bedside Disposition Plan: Patient is from home.  She needs IV antibiotics till 3/12.  Anticipate discharge to home after completion of antibiotics   Consultants: Urology, ID, orthopedics, cardiothoracic surgery, dentistry  Procedures: As above  Antimicrobials:  Anti-infectives (From admission, onward)   Start     Dose/Rate Route Frequency Ordered Stop   06/23/19 0730  ceFAZolin (ANCEF) powder 2 g     2 g Other To Surgery 06/20/19 1302 06/24/19 0730   06/19/19 2300  vancomycin (VANCOCIN) IVPB 1000 mg/200 mL premix  Status:  Discontinued     1,000 mg 200 mL/hr over 60 Minutes Intravenous Every 12 hours 06/19/19 2156 06/19/19 2159   06/19/19 2300  vancomycin (VANCOREADY) IVPB 750 mg/150 mL     750 mg 150 mL/hr over 60 Minutes Intravenous Every 12 hours 06/19/19 2159     06/19/19 2200  vancomycin (VANCOREADY) IVPB 750 mg/150 mL  Status:  Discontinued     750 mg 150 mL/hr over 60 Minutes Intravenous Every 12 hours 06/19/19 1448 06/19/19 2156   06/18/19 2200  vancomycin (VANCOREADY) IVPB 750 mg/150 mL  Status:  Discontinued     750 mg 150 mL/hr over 60 Minutes Intravenous Every 12 hours 06/18/19 1221 06/18/19 1225   06/18/19 2100  vancomycin (VANCOREADY) IVPB 750 mg/150 mL  Status:  Discontinued     750 mg 150 mL/hr over 60 Minutes Intravenous Every 12 hours 06/18/19 1225 06/19/19 1448   06/09/19 0800  vancomycin (VANCOREADY) IVPB 750 mg/150 mL  Status:  Discontinued     750 mg 150 mL/hr over 60 Minutes Intravenous Every 12 hours 06/08/19 1840 06/18/19 1221   06/03/19 1800  vancomycin (VANCOCIN) IVPB 1000 mg/200 mL premix  Status:  Discontinued      1,000 mg 200 mL/hr over 60 Minutes Intravenous Every 12 hours 06/03/19 0456 06/08/19 1841   05/31/19 0200  vancomycin (VANCOREADY) IVPB 750 mg/150 mL  Status:  Discontinued     750 mg 150 mL/hr over 60 Minutes Intravenous Every 12 hours 05/31/19 0126 06/03/19 0456   05/30/19 2200  vancomycin (VANCOREADY) IVPB 750 mg/150 mL  Status:  Discontinued     750 mg 150 mL/hr over 60 Minutes Intravenous Every 12 hours 05/30/19 1207 05/30/19 2126   05/25/19 1230  amoxicillin (AMOXIL) capsule 500 mg     500 mg Oral  Once 05/25/19 1120 05/25/19 1435   05/21/19 1000  vancomycin (VANCOCIN) IVPB 1000 mg/200 mL premix  Status:  Discontinued     1,000 mg 200 mL/hr over 60 Minutes Intravenous Every 12 hours 05/21/19 0948 05/30/19 1207   05/20/19 1400  cefTRIAXone (ROCEPHIN) 2 g in sodium chloride 0.9 % 100 mL IVPB  Status:  Discontinued     2 g 200 mL/hr over 30 Minutes Intravenous Every 24 hours 05/20/19 1328 05/22/19 0947   05/19/19 2200  levofloxacin (LEVAQUIN) IVPB 750 mg  Status:  Discontinued     750 mg 100 mL/hr over 90 Minutes Intravenous Every 24 hours 05/19/19 0212 05/19/19 1884  05/19/19 2200  levofloxacin (LEVAQUIN) IVPB 500 mg  Status:  Discontinued     500 mg 100 mL/hr over 60 Minutes Intravenous Every 24 hours 05/19/19 0213 05/19/19 0539   05/19/19 1000  vancomycin (VANCOREADY) IVPB 750 mg/150 mL  Status:  Discontinued     750 mg 150 mL/hr over 60 Minutes Intravenous Every 12 hours 05/19/19 0539 05/21/19 0948   05/19/19 0600  ceFEPIme (MAXIPIME) 2 g in sodium chloride 0.9 % 100 mL IVPB  Status:  Discontinued     2 g 200 mL/hr over 30 Minutes Intravenous Every 8 hours 05/19/19 0539 05/19/19 2348   05/19/19 0130  vancomycin (VANCOCIN) IVPB 1000 mg/200 mL premix     1,000 mg 200 mL/hr over 60 Minutes Intravenous  Once 05/19/19 0122 05/19/19 0253   05/18/19 2345  levofloxacin (LEVAQUIN) IVPB 750 mg     750 mg 100 mL/hr over 90 Minutes Intravenous  Once 05/18/19 2338 05/19/19 0140       Subjective:  Patient seen and examined the bedside this morning.  Hemodynamically stable.  No active issues.   Objective: Vitals:   06/28/19 0500 06/28/19 1406 06/28/19 2151 06/29/19 0652  BP: 124/85 113/86 122/87 118/72  Pulse: (!) 103 96 95 93  Resp: 17 16 18 18   Temp: 98 F (36.7 C) 98.5 F (36.9 C) 97.9 F (36.6 C) 98.1 F (36.7 C)  TempSrc: Oral Oral Oral Oral  SpO2: 100% 100% 100% 100%  Weight:      Height:        Intake/Output Summary (Last 24 hours) at 06/29/2019 1005 Last data filed at 06/29/2019 0653 Gross per 24 hour  Intake 840 ml  Output --  Net 840 ml   Filed Weights   05/19/19 0400 05/24/19 0836 05/25/19 1728  Weight: 56.5 kg 56.5 kg 56.5 kg    Examination:  General exam: Sleeping,comfortable   Data Reviewed: I have personally reviewed following labs and imaging studies  CBC: No results for input(s): WBC, NEUTROABS, HGB, HCT, MCV, PLT in the last 168 hours. Basic Metabolic Panel: Recent Labs  Lab 06/22/19 1140  NA 139  K 4.3  CL 106  CO2 22  GLUCOSE 87  BUN 14  CREATININE 0.59  CALCIUM 9.5   GFR: Estimated Creatinine Clearance: 95 mL/min (by C-G formula based on SCr of 0.59 mg/dL). Liver Function Tests: No results for input(s): AST, ALT, ALKPHOS, BILITOT, PROT, ALBUMIN in the last 168 hours. No results for input(s): LIPASE, AMYLASE in the last 168 hours. No results for input(s): AMMONIA in the last 168 hours. Coagulation Profile: No results for input(s): INR, PROTIME in the last 168 hours. Cardiac Enzymes: No results for input(s): CKTOTAL, CKMB, CKMBINDEX, TROPONINI in the last 168 hours. BNP (last 3 results) No results for input(s): PROBNP in the last 8760 hours. HbA1C: No results for input(s): HGBA1C in the last 72 hours. CBG: No results for input(s): GLUCAP in the last 168 hours. Lipid Profile: No results for input(s): CHOL, HDL, LDLCALC, TRIG, CHOLHDL, LDLDIRECT in the last 72 hours. Thyroid Function Tests: No results  for input(s): TSH, T4TOTAL, FREET4, T3FREE, THYROIDAB in the last 72 hours. Anemia Panel: No results for input(s): VITAMINB12, FOLATE, FERRITIN, TIBC, IRON, RETICCTPCT in the last 72 hours. Sepsis Labs: No results for input(s): PROCALCITON, LATICACIDVEN in the last 168 hours.  No results found for this or any previous visit (from the past 240 hour(s)).       Radiology Studies: No results found.  Scheduled Meds: . carvedilol  3.125 mg Oral BID WC  . ferrous sulfate  325 mg Oral BID  . gabapentin  300 mg Oral BID  . magnesium oxide  400 mg Oral BID  . methadone  20 mg Oral Daily  . nicotine  14 mg Transdermal Daily  . polyethylene glycol  17 g Oral BID  . prenatal multivitamin  1 tablet Oral Daily  . senna-docusate  1 tablet Oral BID   Continuous Infusions: . lactated ringers 10 mL/hr at 05/24/19 0902  . lactated ringers 10 mL/hr at 06/23/19 1057  . vancomycin 750 mg (06/29/19 0950)     LOS: 41 days    Time spent: 15 mins.More than 50% of that time was spent in counseling and/or coordination of care.      Burnadette Pop, MD [facility] P3/02/2020, 10:05 AM gd

## 2019-06-29 NOTE — Progress Notes (Signed)
POST OPERATIVE NOTE:  06/29/2019 Christy Pearson [id number]   VITALS: BP 118/72 (BP Location: Right Wrist)   Pulse 93   Temp 98.1 F (36.7 C) (Oral)   Resp 18   Ht 5' 7.01" (1.702 m)   Wt 56.5 kg   LMP 04/27/2019 (Within Days)   SpO2 100%   BMI 19.50 kg/m   LABS:  Lab Results  Component Value Date   WBC 5.1 06/19/2019   HGB 10.4 (L) 06/19/2019   HCT 33.8 (L) 06/19/2019   MCV 86.2 06/19/2019   PLT 280 06/19/2019   BMET    Component Value Date/Time   NA 139 06/22/2019 1140   K 4.3 06/22/2019 1140   CL 106 06/22/2019 1140   CO2 22 06/22/2019 1140   GLUCOSE 87 06/22/2019 1140   BUN 14 06/22/2019 1140   CREATININE 0.59 06/22/2019 1140   CALCIUM 9.5 06/22/2019 1140   GFRNONAA >60 06/22/2019 1140   GFRAA >60 06/22/2019 1140    Lab Results  Component Value Date   INR 1.3 (H) 05/20/2019   INR 1.3 (H) 05/18/2019   No results found for: PTT   Christy Pearson is status post multiple extractions with the alveoloplasty and gross debridement of remaining dentition in the operating room on 06/23/2019.  SUBJECTIVE: Patient with minimal discomfort from dental extraction sites.  Patient indicates that she had to have some IV pain medication for the first 3 nights after the extraction procedures but is doing well now.    EXAM: There is no sign of infection, heme, or ooze.  Sutures are loosely intact.  Patient is healing in by generalized primary closure.  Patient still has multiple dental caries and will need dental restorations and continued periodontal therapy once discharged with the primary dentist of her choice.  ASSESSMENT: Post operative course is consistent with dental procedures performed in the operating room with general anesthesia Loss of teeth due to extraction Dental caries  PLAN: 1.  Continue salt water rinses every 2 hours while awake. 2.  Advance diet as tolerated. 3.  Follow-up with a primary dentist of her choice for continued comprehensive dental  care, restoration of dental caries, continued periodontal therapy.  Patient will require antibiotic premedication prior to invasive dental procedures due to the history of the endocarditis. 4.  Patient may contact dental medicine for evaluation for suture removal once discharged.   Charlynne Pander, DDS

## 2019-06-30 DIAGNOSIS — B9562 Methicillin resistant Staphylococcus aureus infection as the cause of diseases classified elsewhere: Secondary | ICD-10-CM | POA: Diagnosis not present

## 2019-06-30 DIAGNOSIS — R7881 Bacteremia: Secondary | ICD-10-CM | POA: Diagnosis not present

## 2019-06-30 MED ORDER — CARVEDILOL 3.125 MG PO TABS: 3.1250 mg | ORAL_TABLET | Freq: Two times a day (BID) | ORAL | 1 refills | Status: DC

## 2019-06-30 MED ORDER — MAGNESIUM OXIDE 400 (241.3 MG) MG PO TABS: 400.0000 mg | ORAL_TABLET | Freq: Two times a day (BID) | ORAL | 1 refills | Status: DC

## 2019-06-30 MED ORDER — FERROUS SULFATE 325 (65 FE) MG PO TABS: 325.0000 mg | ORAL_TABLET | Freq: Two times a day (BID) | ORAL | 0 refills | Status: DC

## 2019-06-30 MED ORDER — GABAPENTIN 600 MG PO TABS: 300.0000 mg | ORAL_TABLET | Freq: Two times a day (BID) | ORAL | 0 refills | Status: DC

## 2019-06-30 MED ORDER — IBUPROFEN 600 MG PO TABS: 600.0000 mg | ORAL_TABLET | Freq: Four times a day (QID) | ORAL | 0 refills | Status: DC | PRN

## 2019-06-30 MED ORDER — PRENATAL VITAMINS 0.8 MG PO TABS: 1.0000 | ORAL_TABLET | Freq: Every day | ORAL | 0 refills | Status: DC

## 2019-06-30 MED ORDER — METHADONE HCL 10 MG PO TABS: 20.0000 mg | ORAL_TABLET | Freq: Every day | ORAL | 0 refills | Status: DC

## 2019-06-30 MED ORDER — VANCOMYCIN HCL 750 MG/150ML IV SOLN
750.0000 mg | Freq: Two times a day (BID) | INTRAVENOUS | Status: AC
  Administered 2019-06-30 (×2): 750 mg via INTRAVENOUS
  Filled 2019-06-30 (×2): qty 150

## 2019-06-30 MED FILL — PRENATAL FE 27-1 MG TAB: 27-1 | 60 days supply | Qty: 60 | Fill #0

## 2019-06-30 MED FILL — GABAPENTIN 600 MG TABLET: 600 | 30 days supply | Qty: 30 | Fill #0

## 2019-06-30 MED FILL — IBUPROFEN 600 MG TABS: 600 | 8 days supply | Qty: 30 | Fill #0

## 2019-06-30 MED FILL — FERROUS SULFATE 325 MG TAB: 325 (65 FE) | 60 days supply | Qty: 120 | Fill #0

## 2019-06-30 MED FILL — MAGNESIUM OXIDE 400 MG TABS: 400 | 30 days supply | Qty: 60 | Fill #0

## 2019-06-30 MED FILL — CARVEDILOL 3.125 MG TABLET: 3.125 | 30 days supply | Qty: 60 | Fill #0

## 2019-06-30 NOTE — Progress Notes (Signed)
     Subjective: S/p bilateral shoulder arthroscopy irrigation and debridement on 05/25/19. Patient alert and oriented, walking the hallways eating pizza. Patient states pain in bilateral shoulders is mild and feels like it is mainly due to her sleeping on them in uncomfortable positions. She states that he ROM has improved significantly and is happy that she can now wash her hair with both hands. She is continuing to work on ROM and strength exercises provided by OT.   Objective:  PE: VITALS:   Vitals:   06/29/19 0652 06/29/19 1551 06/29/19 2204 06/30/19 0630  BP: 118/72 113/73 120/77 122/69  Pulse: 93 94 (!) 103 99  Resp: 18 18 18 18   Temp: 98.1 F (36.7 C) 98.1 F (36.7 C) 98.5 F (36.9 C) 98.1 F (36.7 C)  TempSrc: Oral Oral Oral Oral  SpO2: 100% 100% 99% 98%  Weight:      Height:       General: Alert, oriented, in no acute distress Resp: No use of accessory musculature forward flexion of shoulders 0-140 degrees. No TTP to bilateral shoulders. Distal sensation intact. No Erythema or edema noted on either shoulder.  Arthroscopic incision sites fully healed.    LABS  No results found for this or any previous visit (from the past 24 hour(s)).  No results found.  Assessment/Plan: Principal Problem:   MRSA bacteremia Active Problems:   Injection of illicit drug within last 12 months   Hepatitis C antibody positive, s/p spontaneously cleared infection   Sepsis (HCC)   Septic pulmonary embolism (HCC)   Substance use disorder   Suspected endocarditis   IV drug abuse (HCC)   Septic embolism (HCC)   Staphylococcal arthritis of right shoulder (HCC)   Staphylococcal arthritis of left wrist (HCC)   PFO (patent foramen ovale)   Endocarditis of tricuspid valve   Septic arthritis of shoulder, left (HCC)   Septic arthritis of shoulder, right (HCC)   Elevated LFTs  S/p bilateral shoulder arthroscopy irrigation and debridement on 05/25/19 Patient finishing IV  antibiotics today with plan to be discharged after last dose later this evening. Continue WBAT in bilateral upper extremities. Counseled patient to continue to work on ROM and strength exercises provided by OT. Follow up with Dr. 07/23/19 2 weeks after discharge.   Contact information:   Weekdays 8-5 12-22-2005, Janine Ores [place] A fter hours and holidays please check [email or web address] for group call information for [facility]  [phone] 06/30/2019, 1:28 PM

## 2019-06-30 NOTE — Discharge Summary (Signed)
Physician Discharge Summary  Christy Pearson ZLD:[id number] DOB: Dec 17, 1990 DOA: 05/18/2019  PCP: Patient, No Pcp Per  Admit date: 05/18/2019 Discharge date: 06/30/2019  Admitted From: Home Disposition:  Home  Discharge Condition:Stable CODE STATUS:FULL Diet recommendation:  Regular    Brief/Interim Summary:  Patient is a 29 year old female with history of polysubstance abuse including heroin, crystal meth, hepatitis C open to the emergency department on 1/28 with complaints of fever, chest pain, cough, flank pain, weakness.  She was febrile on presentation.  CT imaging showed bilateral cavitary nodules, pneumonia concerning for septic emboli.  Blood cultures came came  positive for MRSA.  TEE noted tricuspid valve endocarditis.  She was also found to have bilateral shoulder, spine infection. 2/4, she underwent arthroscopy, irrigation, debridement of both shoulder joints by orthopedic surgery. ID recommended 6 weeks of IV vancomycin. PICC line placed on 2/14.  Her hospital course has also been notable for a blood transfusion and IV iron secondary to anemia of chronic disease. She also had a UTI which was treated and a PICC line was placed for long-term IV antibiotics. On 3/1, patient was seen by dentist Dr. Kristin Bruins to evaluate poor dentition as a possible source of infection for this patient. On 3/5, patient underwent extraction of multiple teeth extraction as well as alveoloplasty. She completed antibiotic course today.  She will follow-up with orthopedics, dentistry, CT surgery as an outpatient.  Following problems were addressed during her hospitalization:   MRSA bacteremiasecondary to IV drug use Tricuspid valve endocarditis,  multilevel vertebral osteomyelitis,  septic pulmonary emboli,  bilateral septic shoulder arthritis suspected left wrist septic arthrits Severe sepsis - POA, resolved. -2/4, she underwent arthroscopy, irrigation, debridement of both shoulder joints by  orthopedic surgery. -ID recommended 6 weeks of IV vancomycin. PICC line placed on 2/14.  -Patient  completed  the course of IV vancomycin on 06/30/2019.   Poor dental hygiene  multiple dental caries, periapical abscess -Dr. Kristin Bruins  reviewed her orthopantogram and felt that the patient needs multiple extractions and requested preoperative clearance to see if she was safe for general anesthesia -3/1, patient was seen by dentist Dr. Kristin Bruins to evaluate poor dentition as a possible source of infection for this patient. -3/5, patient underwent extraction of multiple teeth extraction as well as alveoloplasty.  -Follow up with dentistry as an outpatient  History of IV drug abuse -Currently on methadone 20 mg daily, gabapentin 300 mg twice daily, ibuprofen as needed every 6 hours. -Counselled for drug cessation.  Provided resources for outpatient follow-up for drug rehabilitation  Anemia of chronic disease -Status post 1 unit PRBC transfusion as well as IV iron therapy.  -Continue ferrous sulfate.  E. Coli UTI - POA -Completed course of IV Rocephin.  Leg Cramps -Continue with Gabapentin 300 mg po BID  Hypomagnesemia/hypophosphatemia -Improved with replacement.  TobaccoAbuse -Patient states that she smokes 1 pack/day -Smoking cessation counseling given     Discharge Diagnoses:  Principal Problem:   MRSA bacteremia Active Problems:   Injection of illicit drug within last 12 months   Hepatitis C antibody positive, s/p spontaneously cleared infection   Sepsis (HCC)   Septic pulmonary embolism (HCC)   Substance use disorder   Suspected endocarditis   IV drug abuse (HCC)   Septic embolism (HCC)   Staphylococcal arthritis of right shoulder (HCC)   Staphylococcal arthritis of left wrist (HCC)   PFO (patent foramen ovale)   Endocarditis of tricuspid valve   Septic arthritis of shoulder, left (HCC)   Septic arthritis of  shoulder, right (HCC)   Elevated  LFTs    Discharge Instructions  Discharge Instructions    Diet - low sodium heart healthy   Complete by: As directed    Discharge instructions   Complete by: As directed    1)Please follow-up with cardiothoracic surgery, dentistry, orthopedic surgery.  Name and number of the providers have been attached. 2)Please quit drugs.Follow up with outpatient drug rehabilitation services. 3)Take prescribed medications as instructed.   Increase activity slowly   Complete by: As directed      Allergies as of 06/30/2019      Reactions   Tylenol [acetaminophen] Rash   Pt just reported tylenol allergy at this visit at 2338pm      Medication List    STOP taking these medications   Magnesium Salicylate 325 MG Tabs     TAKE these medications   carvedilol 3.125 MG tablet Commonly known as: COREG Take 1 tablet (3.125 mg total) by mouth 2 (two) times daily with a meal.   ferrous sulfate 325 (65 FE) MG tablet Commonly known as: FerrouSul Take 1 tablet (325 mg total) by mouth 2 (two) times daily.   gabapentin 600 MG tablet Commonly known as: NEURONTIN Take 0.5 tablets (300 mg total) by mouth 2 (two) times daily.   ibuprofen 600 MG tablet Commonly known as: ADVIL Take 1 tablet (600 mg total) by mouth every 6 (six) hours as needed.   magnesium oxide 400 (241.3 Mg) MG tablet Commonly known as: MAG-OX Take 1 tablet (400 mg total) by mouth 2 (two) times daily.   methadone 10 MG tablet Commonly known as: DOLOPHINE Take 2 tablets (20 mg total) by mouth daily. Start taking on: July 01, 2019 What changed:   how much to take  additional instructions   Prenatal Vitamins 0.8 MG tablet Take 1 tablet by mouth daily.      Follow-up Information    Teryl Lucy, MD. Schedule an appointment as soon as possible for a visit in 2 weeks.   Specialty: Orthopedic Surgery Contact information: [address] [place] [address] [phone]        Call Charlynne Pander, DDS.   Specialty: Dentistry Why: Call for appointment for suture removal once discharged from hospital Contact information: [address] [place] [address] [phone]        Corliss Skains, MD. Schedule an appointment as soon as possible for a visit in 1 week(s).   Specialty: Cardiothoracic Surgery Contact information: [address] [place] [address] [phone]          Allergies  Allergen Reactions  . Tylenol [Acetaminophen] Rash    Pt just reported tylenol allergy at this visit at 2338pm    Consultations:  Orthopedics, CT surgery, ID, dentistry   Procedures/Studies: DG Orthopantogram  Result Date: 06/17/2019 CLINICAL DATA:  29 year old female with pain and swelling in the RIGHT jaw. EXAM: ORTHOPANTOGRAM/PANORAMIC COMPARISON:  None. FINDINGS: Multiple bilateral dental caries are noted. Periapical lucency along teeth 18, 19, 20, 30 and 31 are noted compatible with periapical abscesses. IMPRESSION: Multiple bilateral dental caries as well as periapical abscesses of teeth 18, 19, 20, 30 and 31. Electronically Signed   By: Harmon Pier M.D.   On: 06/17/2019 16:09   ECHOCARDIOGRAM LIMITED  Result Date: 06/19/2019    ECHOCARDIOGRAM LIMITED REPORT   Patient Name:   Christy Pearson Date of Exam: 06/19/2019 Medical Rec #:  [id number]  Height:       67.0 in Accession #:    [id number]      Weight:       124.6 lb Date of Birth:  1990/07/13       BSA:          1.654 m Patient Age:    28 years        BP:           110/81 mmHg Patient Gender: F               HR:           108 bpm. Exam Location:  Inpatient Procedure: Limited Echo, Limited Color Doppler and Cardiac Doppler Indications:     Endocarditis  History:         Patient has prior history of Echocardiogram examinations, most                  recent 05/23/2019. 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  Sonographer:     Mikki Santee RDCS (AE) Referring Phys:  [id number] [facility] Diagnosing Phys:  Vernell Leep MD IMPRESSIONS  1. Left ventricular ejection fraction by PLAX is 70 %. Left ventricular endocardial border not optimally defined to evaluate regional wall motion. Left ventricular diastolic function could not be evaluated.  2. Right ventricular systolic function is normal. The right ventricular size is normal. There is mildly elevated pulmonary artery systolic pressure.  3. Right atrial size was severely dilated.  4. 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  5. No significant change compared to TEE on 05/23/2019. FINDINGS  Left Ventricle: Left ventricular ejection fraction by PLAX is 70 % Left ventricular endocardial border not optimally defined to evaluate regional wall motion. Right Ventricle: The right ventricular size is normal. Right ventricular systolic function is normal. There is mildly elevated pulmonary artery systolic pressure. The tricuspid regurgitant velocity is 2.74 m/s, and with an assumed right atrial pressure of 8 mmHg, the estimated right ventricular systolic pressure is 85.2 mmHg. Right Atrium: Right atrial size was severely dilated. Pericardium: Trivial pericardial effusion is present. The pericardial effusion is circumferential. Mitral Valve: The mitral valve is grossly normal. Tricuspid Valve: Vegetations on septal and posterior leaflets (1X0.5 cm). The tricuspid valve is abnormal. Tricuspid valve regurgitation is severe. Aortic Valve: The aortic valve is grossly normal. Aortic valve regurgitation is not visualized. Aorta: The aortic root is normal in size and structure. Venous: The inferior vena cava is normal in size with less than 50% respiratory variability, suggesting right atrial pressure of 8 mmHg. The inferior vena cava and the hepatic vein show a pattern of systolic flow reversal, suggestive of tricuspid regurgitation. IAS/Shunts: There is redundancy of the interatrial septum. No atrial level shunt detected by color flow Doppler.  LEFT  VENTRICLE PLAX 2D LV EF:         Left ventricular ejection fraction by PLAX is 70 % LVIDd:         4.60 cm LVIDs:         2.80 cm LV PW:         1.10 cm LV IVS:        0.70 cm  RIGHT VENTRICLE          IVC RV Basal diam:  3.80 cm  IVC diam: 2.00 cm RV Mid diam:    4.60 cm LEFT ATRIUM           Index       RIGHT ATRIUM  Index LA diam:      3.00 cm 1.81 cm/m  RA Area:     21.40 cm LA Vol (A4C): 23.1 ml 13.97 ml/m RA Volume:   62.00 ml  37.49 ml/m   AORTA Ao Root diam: 2.70 cm TRICUSPID VALVE TR Peak grad:   30.0 mmHg TR Vmax:        274.00 cm/s Truett MainlandManish Patwardhan MD Electronically signed by Truett MainlandManish Patwardhan MD Signature Date/Time: 06/19/2019/1:04:18 PM    Final    US EKG SITE RITE  Result Date: 06/04/2019 If Site Rite image not attached, placement could not be confirmed due to current cardiac rhythm.      Subjective: Patient seen and examined at the bedside this morning.  Hemodynamically stable for discharge.  Discharge Exam: Vitals:   06/29/19 2204 06/30/19 0630  BP: 120/77 122/69  Pulse: (!) 103 99  Resp: 18 18  Temp: 98.5 F (36.9 C) 98.1 F (36.7 C)  SpO2: 99% 98%   Vitals:   06/29/19 0652 06/29/19 1551 06/29/19 2204 06/30/19 0630  BP: 118/72 113/73 120/77 122/69  Pulse: 93 94 (!) 103 99  Resp: 18 18 18 18   Temp: 98.1 F (36.7 C) 98.1 F (36.7 C) 98.5 F (36.9 C) 98.1 F (36.7 C)  TempSrc: Oral Oral Oral Oral  SpO2: 100% 100% 99% 98%  Weight:      Height:        General: Pt is alert, awake, not in acute distress Cardiovascular: RRR, S1/S2 +, no rubs, no gallops Respiratory: CTA bilaterally, no wheezing, no rhonchi Abdominal: Soft, NT, ND, bowel sounds + Extremities: no edema, no cyanosis    The results of significant diagnostics from this hospitalization (including imaging, microbiology, ancillary and laboratory) are listed below for reference.     Microbiology: No results found for this or any previous visit (from the past 240 hour(s)).   Labs: BNP  (last 3 results) No results for input(s): BNP in the last 8760 hours. Basic Metabolic Panel: No results for input(s): NA, K, CL, CO2, GLUCOSE, BUN, CREATININE, CALCIUM, MG, PHOS in the last 168 hours. Liver Function Tests: No results for input(s): AST, ALT, ALKPHOS, BILITOT, PROT, ALBUMIN in the last 168 hours. No results for input(s): LIPASE, AMYLASE in the last 168 hours. No results for input(s): AMMONIA in the last 168 hours. CBC: No results for input(s): WBC, NEUTROABS, HGB, HCT, MCV, PLT in the last 168 hours. Cardiac Enzymes: No results for input(s): CKTOTAL, CKMB, CKMBINDEX, TROPONINI in the last 168 hours. BNP: Invalid input(s): POCBNP CBG: No results for input(s): GLUCAP in the last 168 hours. D-Dimer No results for input(s): DDIMER in the last 72 hours. Hgb A1c No results for input(s): HGBA1C in the last 72 hours. Lipid Profile No results for input(s): CHOL, HDL, LDLCALC, TRIG, CHOLHDL, LDLDIRECT in the last 72 hours. Thyroid function studies No results for input(s): TSH, T4TOTAL, T3FREE, THYROIDAB in the last 72 hours.  Invalid input(s): FREET3 Anemia work up No results for input(s): VITAMINB12, FOLATE, FERRITIN, TIBC, IRON, RETICCTPCT in the last 72 hours. Urinalysis    Component Value Date/Time   COLORURINE YELLOW 05/18/2019 2252   APPEARANCEUR CLOUDY (A) 05/18/2019 2252   LABSPEC 1.020 05/18/2019 2252   PHURINE 5.5 05/18/2019 2252   GLUCOSEU NEGATIVE 05/18/2019 2252   HGBUR NEGATIVE 05/18/2019 2252   BILIRUBINUR MODERATE (A) 05/18/2019 2252   KETONESUR NEGATIVE 05/18/2019 2252   PROTEINUR 30 (A) 05/18/2019 2252   UROBILINOGEN 0.2 10/28/2015 1453   NITRITE POSITIVE (A) 05/18/2019 2252  LEUKOCYTESUR TRACE (A) 05/18/2019 2252   Sepsis Labs Invalid input(s): PROCALCITONIN,  WBC,  LACTICIDVEN Microbiology No results found for this or any previous visit (from the past 240 hour(s)).  Please note: You were cared for by a hospitalist during your hospital stay.  Once you are discharged, your primary care physician will handle any further medical issues. Please note that NO REFILLS for any discharge medications will be authorized once you are discharged, as it is imperative that you return to your primary care physician (or establish a relationship with a primary care physician if you do not have one) for your post hospital discharge needs so that they can reassess your need for medications and monitor your lab values.    Time coordinating discharge: 40 minutes  SIGNED:   Burnadette Pop, MD  [facility] 06/30/2019, 10:40 AM Pager [phone]  If 7PM-7AM, please contact night-coverage [email or web address] Password [email or web address]

## 2019-06-30 NOTE — Progress Notes (Signed)
DC instructions reviewed and given to pt, methadone Rx given and explained.  TOC gave pt meds earlier today.  Pt understands all meds and follow up appointments post DC.  No further questions about home self care.

## 2019-06-30 NOTE — TOC Transition Note (Signed)
Transition of Care [facility]) - CM/SW Discharge Note   Patient Details  Name: Christy Pearson MRN: [id number] Date of Birth: 03/15/1991  [facility] CM/SW Contact:  Doy Hutching, LCSW Phone Number: 06/30/2019, 12:51 PM   Clinical Narrative:    CSW has again left information for pt on shadow chart for discharge packet. Pt discharging home to parents home. Pt has script for methadone, she is going to seek outpatient clinic near her in [place].    Final next level of care: Home/Self Care Barriers to Discharge: Barriers Resolved   Patient Goals and CMS Choice Patient states their goals for this hospitalization and ongoing recovery are:: to get home, continue methadone, get things sorted with Medicaid and disability  Discharge Plan and Services In-house Referral: Clinical Social Work, Artist, PCP / Production designer, theatre/television/film Services: CM Consult, Medication Assistance, MATCH Program, Follow-up appt scheduled  Readmission Risk Interventions Readmission Risk Prevention Plan 06/09/2019  Post Dischage Appt Not Complete  Appt Comments no PCP, will need appointment at dc  Medication Screening Complete  Transportation Screening Complete  Some recent data might be hidden

## 2019-07-12 ENCOUNTER — Ambulatory Visit: Admitting: Infectious Disease

## 2019-09-04 ENCOUNTER — Inpatient Hospital Stay: Admitting: Infectious Disease

## 2019-09-12 ENCOUNTER — Other Ambulatory Visit: Payer: Self-pay

## 2019-09-12 ENCOUNTER — Encounter: Payer: Self-pay | Admitting: Infectious Disease

## 2019-09-12 ENCOUNTER — Ambulatory Visit (INDEPENDENT_AMBULATORY_CARE_PROVIDER_SITE_OTHER): Payer: Self-pay | Admitting: Infectious Disease

## 2019-09-12 VITALS — BP 112/77 | HR 120 | Temp 97.7°F | Wt 140.0 lb

## 2019-09-12 DIAGNOSIS — M00812 Arthritis due to other bacteria, left shoulder: Secondary | ICD-10-CM

## 2019-09-12 DIAGNOSIS — M00831 Arthritis due to other bacteria, right wrist: Secondary | ICD-10-CM

## 2019-09-12 DIAGNOSIS — I2690 Septic pulmonary embolism without acute cor pulmonale: Secondary | ICD-10-CM

## 2019-09-12 DIAGNOSIS — M00032 Staphylococcal arthritis, left wrist: Secondary | ICD-10-CM

## 2019-09-12 DIAGNOSIS — Q211 Atrial septal defect: Secondary | ICD-10-CM

## 2019-09-12 DIAGNOSIS — Q2112 Patent foramen ovale: Secondary | ICD-10-CM

## 2019-09-12 DIAGNOSIS — B9562 Methicillin resistant Staphylococcus aureus infection as the cause of diseases classified elsewhere: Secondary | ICD-10-CM

## 2019-09-12 DIAGNOSIS — M00012 Staphylococcal arthritis, left shoulder: Secondary | ICD-10-CM

## 2019-09-12 DIAGNOSIS — M00811 Arthritis due to other bacteria, right shoulder: Secondary | ICD-10-CM

## 2019-09-12 DIAGNOSIS — M00011 Staphylococcal arthritis, right shoulder: Secondary | ICD-10-CM

## 2019-09-12 DIAGNOSIS — F1910 Other psychoactive substance abuse, uncomplicated: Secondary | ICD-10-CM

## 2019-09-12 DIAGNOSIS — R7881 Bacteremia: Secondary | ICD-10-CM

## 2019-09-12 DIAGNOSIS — M00832 Arthritis due to other bacteria, left wrist: Secondary | ICD-10-CM

## 2019-09-12 NOTE — Progress Notes (Signed)
Subjective:  Chief complaint: Mid and low back pain   Patient ID: Christy Pearson, female    DOB: June 26, 1990, 29 y.o.   MRN: [id number]  HPI  Christy Pearson is a 29 y.o. 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 Dion Saucier, posssible septic wrist, discitis and osteomyelitis from L5-S1 with ventral epidural enhancement concerning for phlegmon with partial effacement of the thecal sac.Patient  completed  the course of IV vancomycin on 06/30/2019.  She was discharged in the hospital but not been seen by [place] since then.  She has relapsed with her methamphetamine IV drug use.  She says she is not having trouble with IV heroin use.  She is on house arrest and wearing a bracelet because she did not post bail.  She currently has no fevers or other systemic symptoms she has some mid and low back pain with sudden provocative movement but this is much improved compared to when she was in the hospital  Past Medical History:  Diagnosis Date  . Hepatitis C   . Heroin abuse (HCC)   . Kidney stones     Past Surgical History:  Procedure Laterality Date  . BUBBLE STUDY  05/23/2019   Procedure: BUBBLE STUDY;  Surgeon: Elder Negus, MD;  Location: [facility];  Service: Cardiovascular;;  . CESAREAN SECTION  02/02/2012  . MULTIPLE EXTRACTIONS WITH ALVEOLOPLASTY N/A 06/23/2019   Procedure: EXTRACTION OF TOOTH #'S 1,2,3,16,17,18,19, 20, 21,29, 30 AND 31 WITH ALVEOLOPLASTY AND GROSS DEBRIDEMENT OF REMAINING TEETH;  Surgeon: Charlynne Pander, DDS;  Location: [facility] OR;  Service: Oral Surgery;  Laterality: N/A;  . RADIOLOGY WITH ANESTHESIA N/A 05/24/2019   Procedure: MRI WITH ANESTHESIA - LUMBAR , SHOULDER;  Surgeon: Radiologist, Medication, MD;  Location: [facility] OR;  Service: Radiology;  Laterality: N/A;  . SHOULDER ARTHROSCOPY Bilateral 05/25/2019   Procedure: ARTHROSCOPY  SHOULDER;  Surgeon: Teryl Lucy, MD;  Location: [facility] OR;  Service: Orthopedics;  Laterality: Bilateral;  . TEE WITHOUT CARDIOVERSION N/A 05/23/2019   Procedure: TRANSESOPHAGEAL ECHOCARDIOGRAM (TEE);  Surgeon: Elder Negus, MD;  Location: [facility] ENDOSCOPY;  Service: Cardiovascular;  Laterality: N/A;    No family history on file.    Social History   Socioeconomic History  . Marital status: Married    Spouse name: Not on file  . Number of children: Not on file  . Years of education: Not on file  . Highest education level: Not on file  Occupational History  . Not on file  Tobacco Use  . Smoking status: Current Every Day Smoker    Packs/day: 1.00    Years: 5.00    Pack years: 5.00    Types: Cigarettes  . Smokeless tobacco: Never Used  Substance and Sexual Activity  . Alcohol use: No  . Drug use: Yes    Types: Heroin    Comment: Heroin, Xanax  . Sexual activity: Not Currently  Other Topics Concern  . Not on file  Social History Narrative  . Not on file   Social Determinants of Health   Financial Resource Strain:   . Difficulty of Paying Living Expenses:   Food Insecurity:   . Worried About Programme researcher, broadcasting/film/video in the Last Year:   . Barista in the Last Year:   Transportation Needs:   . Freight forwarder (Medical):   Marland Kitchen Lack of Transportation (Non-Medical):   Physical Activity:   .  Days of Exercise per Week:   . Minutes of Exercise per Session:   Stress:   . Feeling of Stress :   Social Connections:   . Frequency of Communication with Friends and Family:   . Frequency of Social Gatherings with Friends and Family:   . Attends Religious Services:   . Active Member of Clubs or Organizations:   . Attends Archivist Meetings:   Marland Kitchen Marital Status:     Allergies  Allergen Reactions  . Tylenol [Acetaminophen] Rash    Pt just reported tylenol allergy at this visit at 2338pm     Current Outpatient Medications:  .  carvedilol (COREG) 3.125 MG  tablet, Take 1 tablet (3.125 mg total) by mouth 2 (two) times daily with a meal., Disp: 60 tablet, Rfl: 1 .  ferrous sulfate (FERROUSUL) 325 (65 FE) MG tablet, Take 1 tablet (325 mg total) by mouth 2 (two) times daily., Disp: 120 tablet, Rfl: 0 .  gabapentin (NEURONTIN) 600 MG tablet, Take 0.5 tablets (300 mg total) by mouth 2 (two) times daily., Disp: 60 tablet, Rfl: 0 .  ibuprofen (ADVIL) 600 MG tablet, Take 1 tablet (600 mg total) by mouth every 6 (six) hours as needed., Disp: 30 tablet, Rfl: 0 .  magnesium oxide (MAG-OX) 400 (241.3 Mg) MG tablet, Take 1 tablet (400 mg total) by mouth 2 (two) times daily., Disp: 60 tablet, Rfl: 1 .  methadone (DOLOPHINE) 10 MG tablet, Take 2 tablets (20 mg total) by mouth daily., Disp: 20 tablet, Rfl: 0 .  Prenatal Multivit-Min-Fe-FA (PRENATAL VITAMINS) 0.8 MG tablet, Take 1 tablet by mouth daily., Disp: 60 tablet, Rfl: 0   Review of Systems  Constitutional: Negative for chills and fever.  HENT: Negative for congestion and sore throat.   Eyes: Negative for photophobia.  Respiratory: Negative for cough, shortness of breath and wheezing.   Cardiovascular: Negative for chest pain, palpitations and leg swelling.  Gastrointestinal: Negative for abdominal pain, blood in stool, constipation, diarrhea, nausea and vomiting.  Genitourinary: Negative for dysuria, flank pain and hematuria.  Musculoskeletal: Negative for back pain and myalgias.  Skin: Negative for rash.  Neurological: Negative for dizziness, weakness and headaches.  Hematological: Does not bruise/bleed easily.  Psychiatric/Behavioral: Negative for behavioral problems, confusion, sleep disturbance and suicidal ideas.       Objective:   Physical Exam Constitutional:      General: She is not in acute distress.    Appearance: She is not diaphoretic.  HENT:     Head: Normocephalic and atraumatic.     Right Ear: External ear normal.     Left Ear: External ear normal.     Nose: Nose normal.      Mouth/Throat:     Pharynx: No oropharyngeal exudate.  Eyes:     General: No scleral icterus.    Conjunctiva/sclera: Conjunctivae normal.     Pupils: Pupils are equal, round, and reactive to light.  Cardiovascular:     Rate and Rhythm: Normal rate and regular rhythm.     Heart sounds: Normal heart sounds. No murmur. No friction rub. No gallop.   Pulmonary:     Effort: Pulmonary effort is normal. No respiratory distress.     Breath sounds: Normal breath sounds. No wheezing or rales.  Abdominal:     General: Bowel sounds are normal. There is no distension.     Palpations: Abdomen is soft.     Tenderness: There is no abdominal tenderness. There is no rebound.  Musculoskeletal:  General: No tenderness. Normal range of motion.     Right shoulder: Normal.     Left shoulder: Normal.     Right wrist: Normal.     Left wrist: Normal.     Cervical back: Normal range of motion and neck supple.  Lymphadenopathy:     Cervical: No cervical adenopathy.  Skin:    General: Skin is warm and dry.     Coloration: Skin is not pale.     Findings: No erythema or rash.  Neurological:     Mental Status: She is alert and oriented to person, place, and time.     Coordination: Coordination normal.  Psychiatric:        Attention and Perception: Attention and perception normal.        Mood and Affect: Mood normal.        Speech: Speech normal.        Behavior: Behavior normal.        Thought Content: Thought content normal.        Cognition and Memory: Cognition and memory normal.        Judgment: Judgment normal.           Assessment & Plan:  metastatic MRSA bacteremia with tricuspid valve endocarditis and a PFO, bilateral cavitary septic emboli, bilateral septic shoulders status post scopic surgery by Dr. Dion Saucier, posssible septic wrist, discitis and osteomyelitis from L5-S1 with ventral epidural enhancement concerning for phlegmon with partial effacement of the thecal sac.Patient  completed   the course of IV vancomycin on 06/30/2019.  Her multiple sites of infection including her bilateral shoulders wrists and even her lumbar disc infection with osteomyelitis seem to have improved dramatically.  We will check some inflammatory markers today and she can come back to see [place] in a couple of months.  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  However I do worry about bacterial infection through continuous use of needles and have recommended she maximize her harm reduction strategies clued in considering other means of inhaling or ingesting methamphetamines but preferably was getting off these drugs altogether.

## 2019-09-13 LAB — CBC WITH DIFFERENTIAL/PLATELET
Absolute Monocytes: 415 cells/uL (ref 200–950)
Basophils Absolute: 40 cells/uL (ref 0–200)
Basophils Relative: 0.8 %
Eosinophils Absolute: 20 cells/uL (ref 15–500)
Eosinophils Relative: 0.4 %
HCT: 40.6 % (ref 35.0–45.0)
Hemoglobin: 13.5 g/dL (ref 11.7–15.5)
Lymphs Abs: 1875 cells/uL (ref 850–3900)
MCH: 28.6 pg (ref 27.0–33.0)
MCHC: 33.3 g/dL (ref 32.0–36.0)
MCV: 86 fL (ref 80.0–100.0)
MPV: 8.9 fL (ref 7.5–12.5)
Monocytes Relative: 8.3 %
Neutro Abs: 2650 cells/uL (ref 1500–7800)
Neutrophils Relative %: 53 %
Platelets: 271 10*3/uL (ref 140–400)
RBC: 4.72 10*6/uL (ref 3.80–5.10)
RDW: 14.1 % (ref 11.0–15.0)
Total Lymphocyte: 37.5 %
WBC: 5 10*3/uL (ref 3.8–10.8)

## 2019-09-13 LAB — COMPLETE METABOLIC PANEL WITH GFR
AG Ratio: 1.4 (calc) (ref 1.0–2.5)
ALT: 10 U/L (ref 6–29)
AST: 16 U/L (ref 10–30)
Albumin: 4.1 g/dL (ref 3.6–5.1)
Alkaline phosphatase (APISO): 83 U/L (ref 31–125)
BUN: 9 mg/dL (ref 7–25)
CO2: 25 mmol/L (ref 20–32)
Calcium: 9.3 mg/dL (ref 8.6–10.2)
Chloride: 105 mmol/L (ref 98–110)
Creat: 0.75 mg/dL (ref 0.50–1.10)
GFR, Est African American: 127 mL/min/{1.73_m2} (ref >=60)
GFR, Est Non African American: 110 mL/min/{1.73_m2} (ref >=60)
Globulin: 2.9 g/dL (calc) (ref 1.9–3.7)
Glucose, Bld: 90 mg/dL (ref 65–99)
Potassium: 3.9 mmol/L (ref 3.5–5.3)
Sodium: 138 mmol/L (ref 135–146)
Total Bilirubin: 0.4 mg/dL (ref 0.2–1.2)
Total Protein: 7 g/dL (ref 6.1–8.1)

## 2019-09-13 LAB — C-REACTIVE PROTEIN: CRP: 3.1 mg/L (ref <8.0)

## 2019-09-13 LAB — SEDIMENTATION RATE: Sed Rate: 11 mm/h (ref 0–20)

## 2019-09-19 ENCOUNTER — Other Ambulatory Visit: Payer: Self-pay

## 2019-09-19 ENCOUNTER — Telehealth: Payer: Self-pay | Admitting: *Deleted

## 2019-09-19 DIAGNOSIS — F1990 Other psychoactive substance use, unspecified, uncomplicated: Secondary | ICD-10-CM

## 2019-09-19 DIAGNOSIS — I339 Acute and subacute endocarditis, unspecified: Secondary | ICD-10-CM

## 2019-09-19 NOTE — Telephone Encounter (Signed)
Patient's mother called to follow up on labs results and future appointments.  Relayed lab results, confirmed up coming appointment with Dr Daiva Eves. She asked for other providers' phone numbers from discharge paperwork to set up appointments with them as well. Done. Andree Coss, RN

## 2019-09-22 ENCOUNTER — Ambulatory Visit: Payer: Self-pay | Admitting: Thoracic Surgery (Cardiothoracic Vascular Surgery)

## 2019-09-28 LAB — DRUG MONITOR, PANEL 1, SCREEN, URINE

## 2019-09-28 LAB — DM TEMPLATE

## 2019-09-29 ENCOUNTER — Other Ambulatory Visit: Payer: Self-pay

## 2019-09-29 ENCOUNTER — Ambulatory Visit (INDEPENDENT_AMBULATORY_CARE_PROVIDER_SITE_OTHER): Payer: Self-pay | Admitting: Thoracic Surgery (Cardiothoracic Vascular Surgery)

## 2019-09-29 VITALS — BP 125/88 | HR 101 | Temp 97.9°F | Resp 16 | Ht 66.0 in | Wt 140.0 lb

## 2019-09-29 DIAGNOSIS — I361 Nonrheumatic tricuspid (valve) insufficiency: Secondary | ICD-10-CM

## 2019-09-29 DIAGNOSIS — F1910 Other psychoactive substance abuse, uncomplicated: Secondary | ICD-10-CM

## 2019-09-29 DIAGNOSIS — I079 Rheumatic tricuspid valve disease, unspecified: Secondary | ICD-10-CM

## 2019-09-29 NOTE — Progress Notes (Signed)
[address]       [place] [address]             [phone]        Andre Mahaney [place] Medical Record #[id number] Date of Birth: 1990/11/11  Referring: Nigel Mormon, MD Primary Care: Patient, No Pcp Per Primary Cardiologist:No primary care provider on file.  Chief Complaint:    Chief Complaint  Patient presents with  . 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  She admits to ongoing illicit drug use, but denies any IV drug use.  Her UDS from earlier this week is pending.    Past Medical History:  Diagnosis Date  . Hepatitis C   . Heroin abuse ([place])   . Kidney stones     Past Surgical History:  Procedure Laterality Date  . BUBBLE STUDY  05/23/2019   Procedure: BUBBLE STUDY;  Surgeon: Nigel Mormon, MD;  Location: [facility];  Service: Cardiovascular;;  . CESAREAN SECTION  02/02/2012  . MULTIPLE EXTRACTIONS WITH ALVEOLOPLASTY N/A 06/23/2019   Procedure: EXTRACTION OF TOOTH #'S 1,2,3,16,17,18,19, 20, 21,29, 30 AND 31 WITH ALVEOLOPLASTY AND GROSS DEBRIDEMENT OF REMAINING TEETH;  Surgeon: Lenn Cal, DDS;  Location: [facility];  Service: Oral Surgery;  Laterality: N/A;  . RADIOLOGY WITH ANESTHESIA N/A 05/24/2019   Procedure: MRI WITH ANESTHESIA - LUMBAR , SHOULDER;  Surgeon: Radiologist, Medication, MD;  Location: [facility];  Service: Radiology;  Laterality: N/A;  . SHOULDER ARTHROSCOPY Bilateral 05/25/2019   Procedure: ARTHROSCOPY SHOULDER;  Surgeon: Marchia Bond, MD;  Location: [facility];  Service: Orthopedics;  Laterality: Bilateral;  . TEE WITHOUT CARDIOVERSION N/A 05/23/2019   Procedure: TRANSESOPHAGEAL ECHOCARDIOGRAM (TEE);  Surgeon:  Nigel Mormon, MD;  Location: [facility] ENDOSCOPY;  Service: Cardiovascular;  Laterality: N/A;      Social History   Tobacco Use  Smoking Status Current Every Day Smoker  . Packs/day: 1.00  . Years: 5.00  . Pack years: 5.00  . Types: Cigarettes  Smokeless Tobacco Never Used    Social History   Substance and Sexual Activity  Alcohol Use No     Allergies  Allergen Reactions  . Tylenol [Acetaminophen] Rash    Pt just reported tylenol allergy at this visit at 2338pm     Current Outpatient Medications  Medication Sig Dispense Refill  . carvedilol (COREG) 3.125 MG tablet Take 1 tablet (3.125 mg total) by mouth 2 (two) times daily with a meal. 60 tablet 1  . ferrous sulfate (FERROUSUL) 325 (65 FE) MG tablet Take 1 tablet (325 mg total) by mouth 2 (two) times daily. 120 tablet 0  . gabapentin (NEURONTIN) 600 MG tablet Take 0.5 tablets (300 mg total) by mouth 2 (two) times daily. 60 tablet 0  . ibuprofen (ADVIL) 600 MG tablet Take 1 tablet (600 mg total) by mouth every 6 (six) hours as needed. 30 tablet 0  . magnesium oxide (MAG-OX) 400 (241.3 Mg) MG tablet Take 1 tablet (400 mg total) by mouth 2 (two) times daily. 60 tablet 1  . Prenatal Multivit-Min-Fe-FA (PRENATAL VITAMINS) 0.8 MG tablet Take 1 tablet by mouth daily. 60 tablet 0   No current facility-administered medications  for this visit.    (Not in a hospital admission)   No family history on file.   Review of Systems:   Review of Systems  Respiratory: Negative.   Cardiovascular: Negative.   Gastrointestinal: Negative.   Genitourinary: Negative.   Musculoskeletal: Positive for back pain and joint pain.  Neurological: Negative.       Physical Exam: BP 125/88 (BP Location: Right Arm, Patient Position: Sitting, Cuff Size: Normal)   Pulse (!) 101   Temp 97.9 F (36.6 C) (Temporal)   Resp 16   Ht 5\' 6"  (1.676 m)   Wt 140 lb (63.5 kg)   LMP 09/04/2019   SpO2 100%   BMI 22.60 kg/m  Physical  Exam Constitutional:      Appearance: Normal appearance.  HENT:     Head: Normocephalic and atraumatic.  Eyes:     Extraocular Movements: Extraocular movements intact.     Conjunctiva/sclera: Conjunctivae normal.  Cardiovascular:     Rate and Rhythm: Tachycardia present.  Pulmonary:     Effort: Pulmonary effort is normal. No respiratory distress.  Abdominal:     General: Abdomen is flat.  Musculoskeletal:        General: Normal range of motion.     Cervical back: Normal range of motion.  Skin:    General: Skin is warm and dry.  Neurological:     General: No focal deficit present.     Mental Status: She is alert and oriented to person, place, and time.  Psychiatric:        Mood and Affect: Mood normal.         I have independently reviewed the above radiologic studies and discussed with the patient   Recent Lab Findings: Lab Results  Component Value Date   WBC 5.0 09/12/2019   HGB 13.5 09/12/2019   HCT 40.6 09/12/2019   PLT 271 09/12/2019   GLUCOSE 90 09/12/2019   ALT 10 09/12/2019   AST 16 09/12/2019   NA 138 09/12/2019   K 3.9 09/12/2019   CL 105 09/12/2019   CREATININE 0.75 09/12/2019   BUN 9 09/12/2019   CO2 25 09/12/2019   TSH 2.774 06/19/2019   INR 1.3 (H) 05/20/2019      Assessment / Plan:   29 yo female with severe TR, hx of treated tricuspid valve endocarditis, and ongoing illicit drug use.  She is not having many symptoms due to her her TR.  I explained to her that sobriety is of most importance in regards to her candidacy for surgery.  She will require 3 random negative UDS prior to any consideration for surgery.  She is agreeable with that plan.     I  spent 40 minutes counseling the patient face to face.   30 09/29/2019 4:46 PM

## 2019-10-02 ENCOUNTER — Encounter: Payer: Self-pay | Admitting: Thoracic Surgery (Cardiothoracic Vascular Surgery)

## 2019-10-03 LAB — DM TEMPLATE

## 2019-10-03 LAB — DRUG MONITOR, PANEL 1, SCREEN, URINE
Amphetamines: NEGATIVE ng/mL (ref <500)
Barbiturates: NEGATIVE ng/mL (ref <300)
Benzodiazepines: NEGATIVE ng/mL (ref <100)
Cocaine Metabolite: NEGATIVE ng/mL (ref <150)
Creatinine: <1 mg/dL
Marijuana Metabolite: NEGATIVE ng/mL (ref <20)
Methadone Metabolite: NEGATIVE ng/mL (ref <100)
Opiates: NEGATIVE ng/mL (ref <100)
Oxidant: NEGATIVE ug/mL
Oxycodone: NEGATIVE ng/mL (ref <100)
Phencyclidine: NEGATIVE ng/mL (ref <25)
Specific Gravity: 1.001 — ABNORMAL LOW (ref >=1.0)
pH: 6.9 (ref 4.5–9.0)

## 2019-10-25 ENCOUNTER — Encounter: Payer: Self-pay | Admitting: Thoracic Surgery (Cardiothoracic Vascular Surgery)

## 2019-10-30 ENCOUNTER — Telehealth: Payer: Self-pay

## 2019-10-30 NOTE — Telephone Encounter (Signed)
Patient called office today requesting earlier appointment with MD regarding some concerns. States that the past week and a half she has been vomiting and feeling fatigued. Is still experiencing back pain, is taking Advil with no relief. Denies any fevers.  Advised patient to go to Urgent Care or ED regarding concerns. States she only wants to see Dr. Daiva Eves since he is aware of her situation. Informed patient that MD is not in office but will forward message as requested. Advised patient again to go to ED if symptoms get worse. Lorenso Courier, [place]

## 2019-11-05 NOTE — Telephone Encounter (Signed)
I Can't see her for weeks she needs to go to ER

## 2019-11-07 NOTE — Telephone Encounter (Signed)
Called patient to follow up on how she is feeling. If patient is still experiencing same symptoms she will need to follow up with ED since provider is out of office. Lorenso Courier, [place]

## 2019-12-13 ENCOUNTER — Ambulatory Visit: Admitting: Infectious Disease

## 2019-12-18 ENCOUNTER — Ambulatory Visit: Payer: Self-pay | Admitting: Infectious Disease

## 2020-01-10 ENCOUNTER — Ambulatory Visit: Payer: Self-pay | Admitting: Infectious Disease

## 2020-11-14 ENCOUNTER — Other Ambulatory Visit: Payer: Self-pay

## 2020-11-14 DIAGNOSIS — I079 Rheumatic tricuspid valve disease, unspecified: Secondary | ICD-10-CM

## 2020-12-02 ENCOUNTER — Ambulatory Visit: Payer: Self-pay | Admitting: Infectious Disease

## 2021-04-04 ENCOUNTER — Emergency Department (HOSPITAL_COMMUNITY): Payer: Self-pay

## 2021-04-04 ENCOUNTER — Inpatient Hospital Stay (HOSPITAL_COMMUNITY)
Admission: EM | Admit: 2021-04-04 | Discharge: 2021-04-07 | DRG: 758 | Disposition: A | Discharge status: Home / Self Care | Payer: Self-pay | Attending: Obstetrics & Gynecology | Admitting: Obstetrics & Gynecology

## 2021-04-04 ENCOUNTER — Other Ambulatory Visit: Payer: Self-pay

## 2021-04-04 ENCOUNTER — Encounter (HOSPITAL_COMMUNITY): Payer: Self-pay | Admitting: Emergency Medicine

## 2021-04-04 DIAGNOSIS — R109 Unspecified abdominal pain: Secondary | ICD-10-CM

## 2021-04-04 DIAGNOSIS — E876 Hypokalemia: Secondary | ICD-10-CM | POA: Diagnosis present

## 2021-04-04 DIAGNOSIS — B182 Chronic viral hepatitis C: Secondary | ICD-10-CM | POA: Diagnosis present

## 2021-04-04 DIAGNOSIS — N739 Female pelvic inflammatory disease, unspecified: Secondary | ICD-10-CM | POA: Diagnosis present

## 2021-04-04 DIAGNOSIS — E861 Hypovolemia: Secondary | ICD-10-CM | POA: Diagnosis present

## 2021-04-04 DIAGNOSIS — Z886 Allergy status to analgesic agent status: Secondary | ICD-10-CM

## 2021-04-04 DIAGNOSIS — I959 Hypotension, unspecified: Secondary | ICD-10-CM | POA: Diagnosis present

## 2021-04-04 DIAGNOSIS — D509 Iron deficiency anemia, unspecified: Secondary | ICD-10-CM | POA: Diagnosis present

## 2021-04-04 DIAGNOSIS — F1123 Opioid dependence with withdrawal: Secondary | ICD-10-CM | POA: Diagnosis not present

## 2021-04-04 DIAGNOSIS — F11188 Opioid abuse with other opioid-induced disorder: Secondary | ICD-10-CM

## 2021-04-04 DIAGNOSIS — N7093 Salpingitis and oophoritis, unspecified: Principal | ICD-10-CM | POA: Diagnosis present

## 2021-04-04 DIAGNOSIS — N730 Acute parametritis and pelvic cellulitis: Secondary | ICD-10-CM | POA: Diagnosis present

## 2021-04-04 DIAGNOSIS — F1110 Opioid abuse, uncomplicated: Secondary | ICD-10-CM | POA: Diagnosis present

## 2021-04-04 DIAGNOSIS — R102 Pelvic and perineal pain: Secondary | ICD-10-CM

## 2021-04-04 DIAGNOSIS — Z20822 Contact with and (suspected) exposure to covid-19: Secondary | ICD-10-CM | POA: Diagnosis present

## 2021-04-04 DIAGNOSIS — N7003 Acute salpingitis and oophoritis: Secondary | ICD-10-CM

## 2021-04-04 DIAGNOSIS — Z79899 Other long term (current) drug therapy: Secondary | ICD-10-CM

## 2021-04-04 DIAGNOSIS — F1910 Other psychoactive substance abuse, uncomplicated: Secondary | ICD-10-CM | POA: Diagnosis present

## 2021-04-04 DIAGNOSIS — F17210 Nicotine dependence, cigarettes, uncomplicated: Secondary | ICD-10-CM | POA: Diagnosis present

## 2021-04-04 LAB — CBC WITH DIFFERENTIAL/PLATELET
Abs Immature Granulocytes: 0.07 10*3/uL (ref 0.00–0.07)
Basophils Absolute: 0 10*3/uL (ref 0.0–0.1)
Basophils Relative: 0 %
Eosinophils Absolute: 0 10*3/uL (ref 0.0–0.5)
Eosinophils Relative: 0 %
HCT: 24.1 % — ABNORMAL LOW (ref 36.0–46.0)
Hemoglobin: 7.5 g/dL — ABNORMAL LOW (ref 12.0–15.0)
Immature Granulocytes: 1 %
Lymphocytes Relative: 8 %
Lymphs Abs: 0.9 10*3/uL (ref 0.7–4.0)
MCH: 24.3 pg — ABNORMAL LOW (ref 26.0–34.0)
MCHC: 31.1 g/dL (ref 30.0–36.0)
MCV: 78 fL — ABNORMAL LOW (ref 80.0–100.0)
Monocytes Absolute: 1.3 10*3/uL — ABNORMAL HIGH (ref 0.1–1.0)
Monocytes Relative: 12 %
Neutro Abs: 8.7 10*3/uL — ABNORMAL HIGH (ref 1.7–7.7)
Neutrophils Relative %: 79 %
Platelets: 315 10*3/uL (ref 150–400)
RBC: 3.09 MIL/uL — ABNORMAL LOW (ref 3.87–5.11)
RDW: 14.4 % (ref 11.5–15.5)
WBC: 11 10*3/uL — ABNORMAL HIGH (ref 4.0–10.5)
nRBC: 0 % (ref 0.0–0.2)

## 2021-04-04 LAB — RESP PANEL BY RT-PCR (FLU A&B, COVID) ARPGX2
Influenza A by PCR: NEGATIVE
Influenza B by PCR: NEGATIVE
SARS Coronavirus 2 by RT PCR: NEGATIVE

## 2021-04-04 LAB — BASIC METABOLIC PANEL
Anion gap: 11 (ref 5–15)
BUN: 8 mg/dL (ref 6–20)
CO2: 23 mmol/L (ref 22–32)
Calcium: 8.8 mg/dL — ABNORMAL LOW (ref 8.9–10.3)
Chloride: 97 mmol/L — ABNORMAL LOW (ref 98–111)
Creatinine, Ser: 0.61 mg/dL (ref 0.44–1.00)
GFR, Estimated: >60 mL/min (ref >60)
Glucose, Bld: 111 mg/dL — ABNORMAL HIGH (ref 70–99)
Potassium: 4 mmol/L (ref 3.5–5.1)
Sodium: 131 mmol/L — ABNORMAL LOW (ref 135–145)

## 2021-04-04 LAB — HIV ANTIBODY (ROUTINE TESTING W REFLEX): HIV Screen 4th Generation wRfx: NONREACTIVE

## 2021-04-04 LAB — I-STAT BETA HCG BLOOD, ED (MC, WL, AP ONLY): I-stat hCG, quantitative: <5 m[IU]/mL (ref <5)

## 2021-04-04 IMAGING — US US PELVIS COMPLETE
1 series · 13 of 25 positions shown · non-contrast
Comparison: CT abdomen and pelvis [DATE]

CLINICAL DATA: Right lower quadrant pain.



[Series 1: us pelvic complete w transvaginal and torsion righ · 41 acquisitions, 13 frames shown]
[im 1/41]
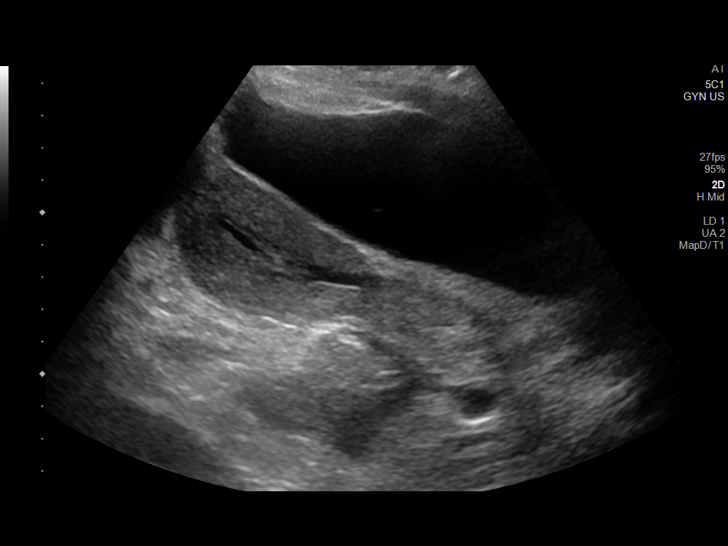
[im 4/41]
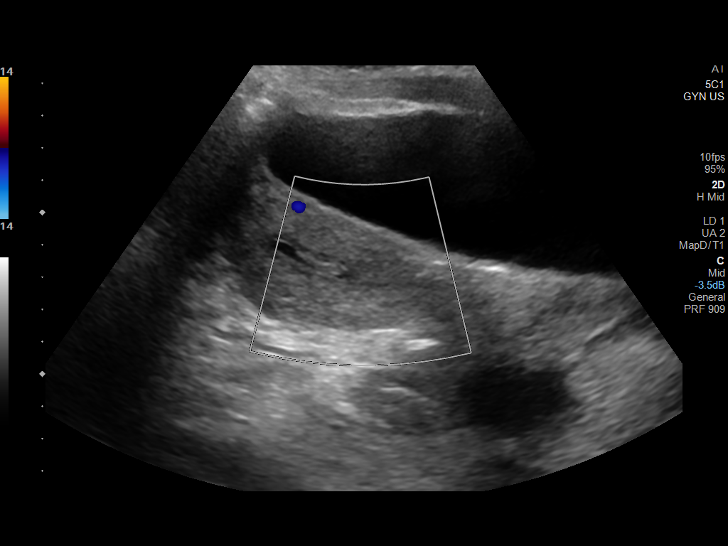
[im 7/41]
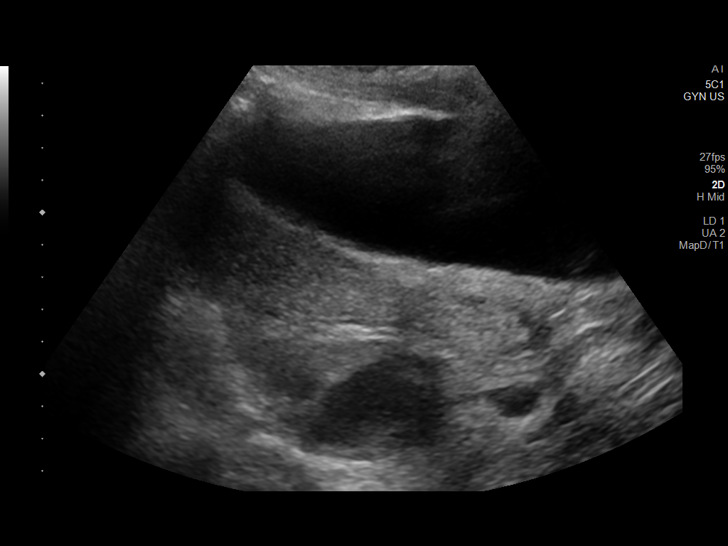
[im 11/41]
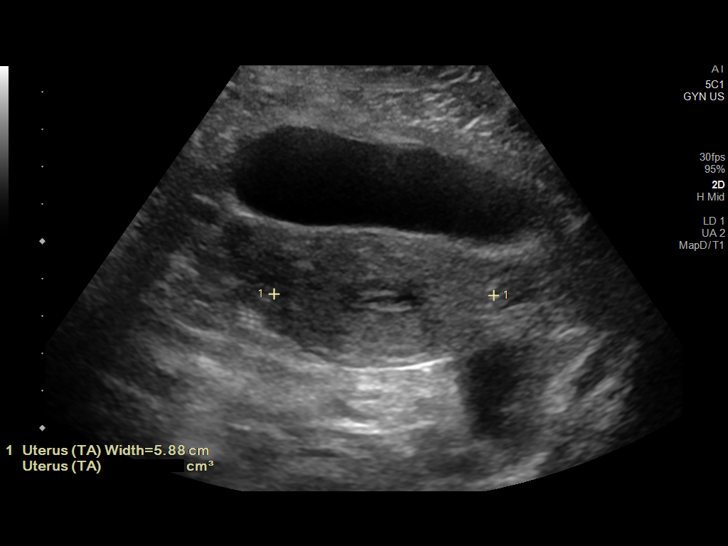
[im 14/41]
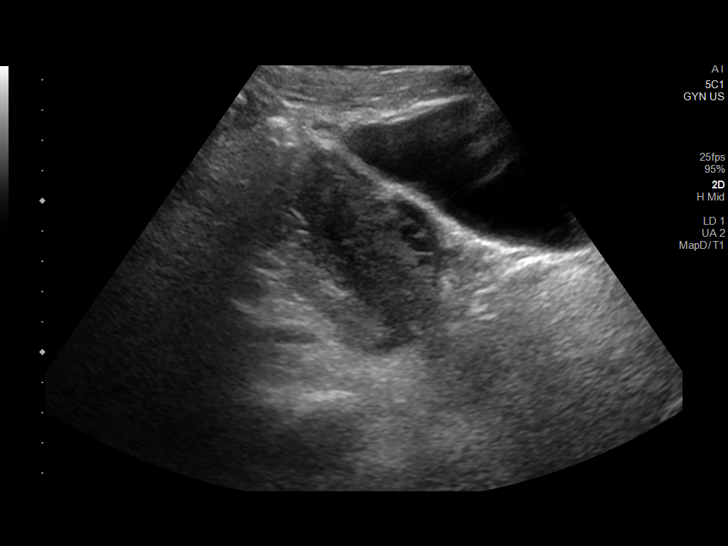
[im 17/41]
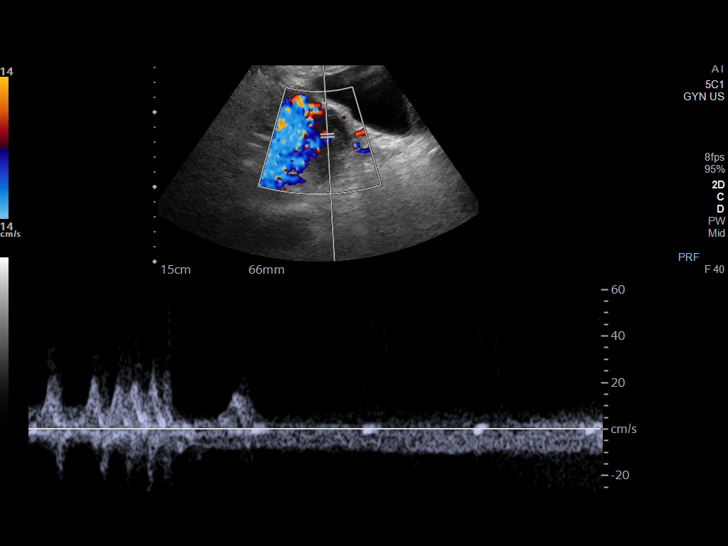
[im 21/41]
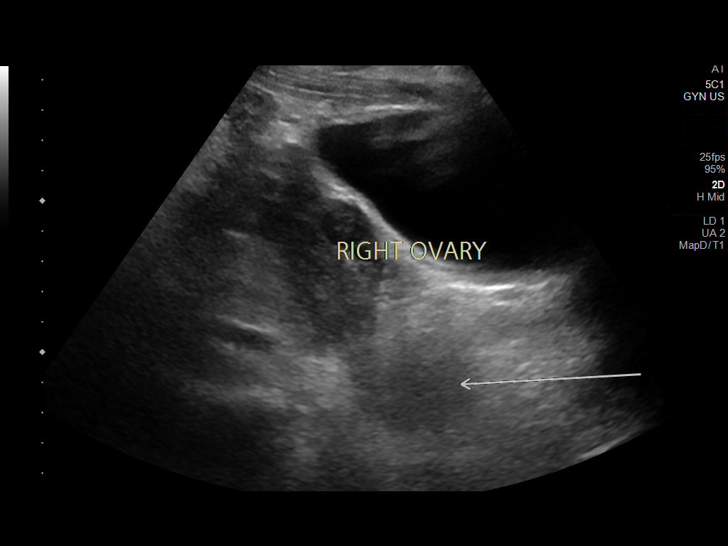
[im 24/41]
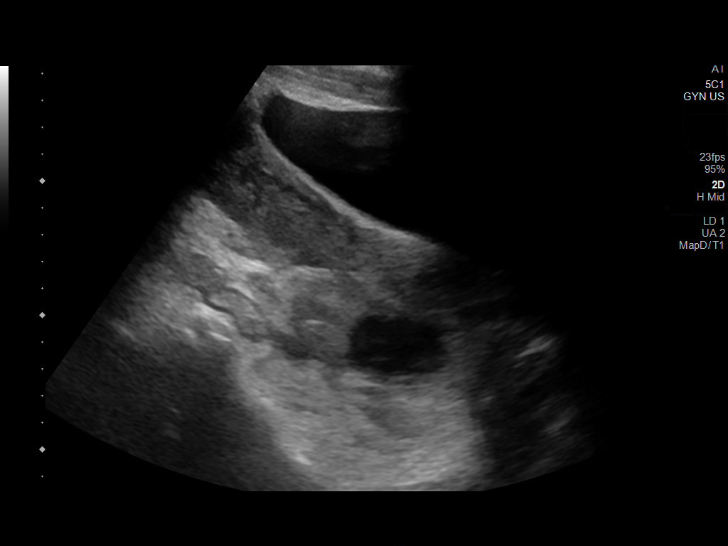
[im 27/41]
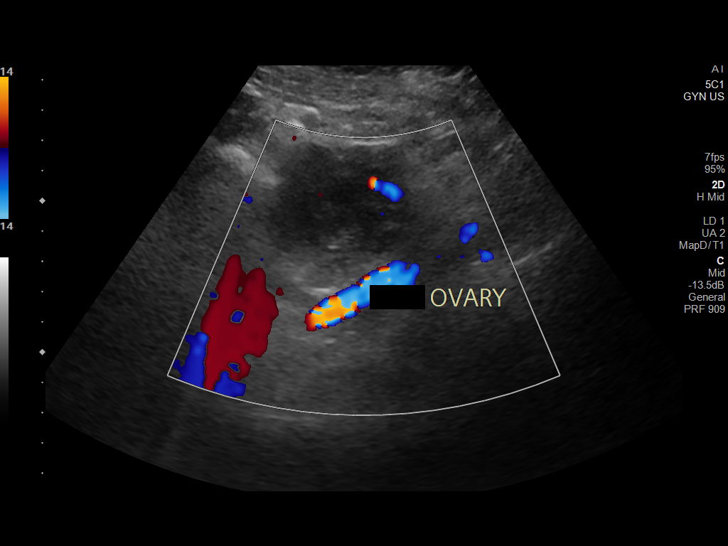
[im 31/41]
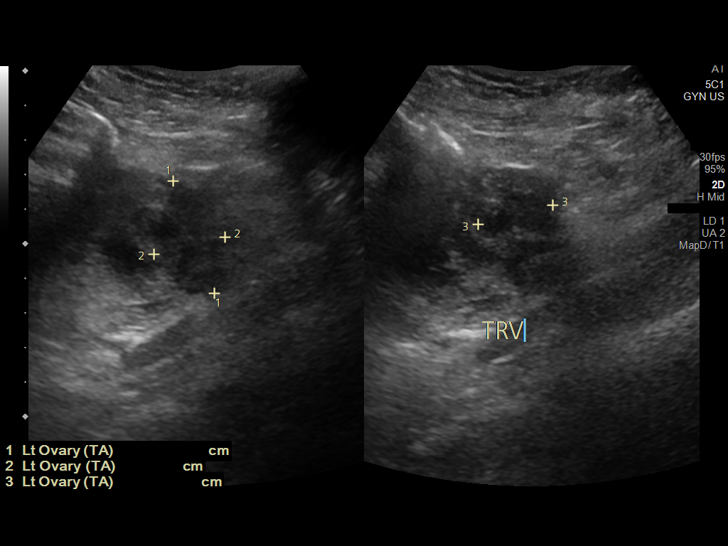
[im 34/41]
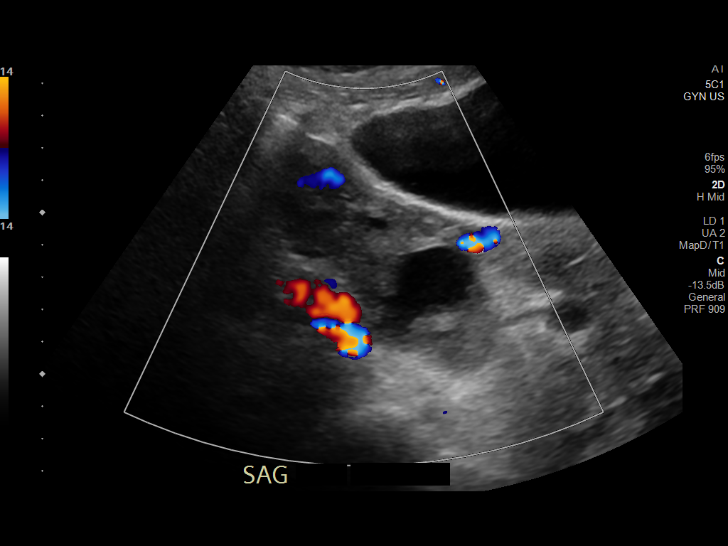
[im 37/41]
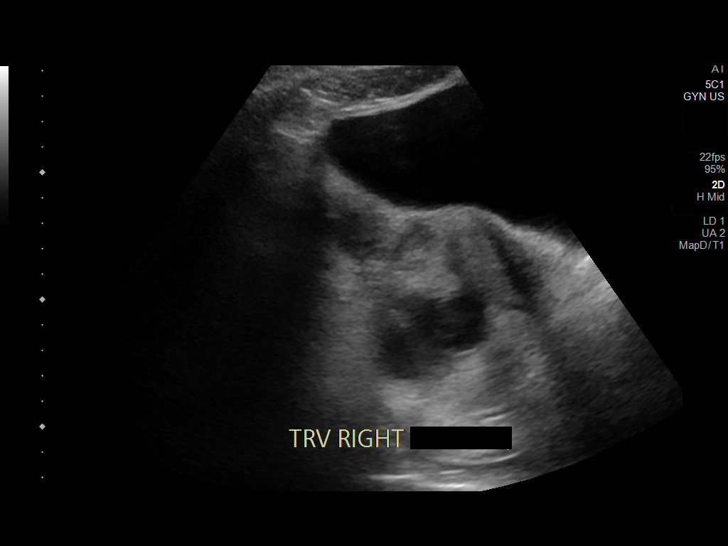
[im 41/41]
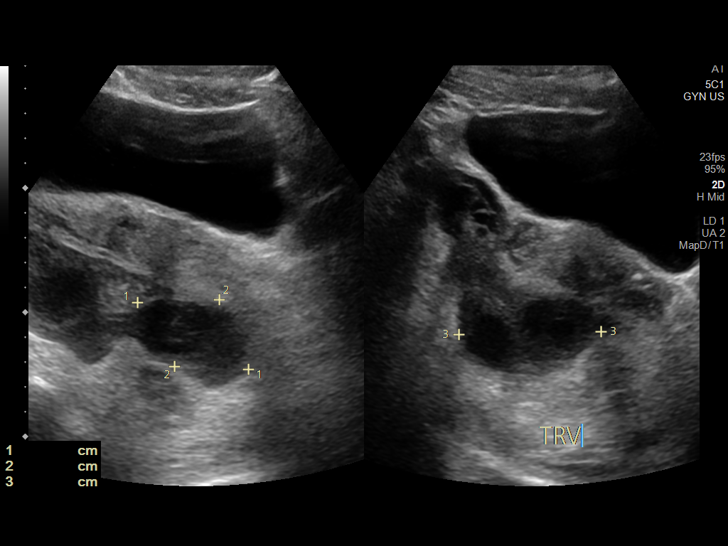

[13 of 25 positions shown; findings below may reference images not displayed]

FINDINGS: Uterus

Measurements: 12.4 x 3.9 x 5.9 cm = volume: 148 mL. No fibroids or
other mass visualized.

Endometrium

Thickness: 7.1 mm. There is a small amount of fluid in the
endometrial canal

Right ovary

Measurements: 5.5 x 3.3 x 3.2 cm = volume: 31 mL. No ovarian lesion.
Complex hypoechoic tubular structure seen in the right adnexa
measuring 5.2 x 3.2 x 5.7 cm.

Left ovary

Measurements: 3.5 x 2.1 x 2.2 cm = volume: 8.5 mL. No ovarian
lesion. Complex hypoechoic tubular structure seen in the left adnexa
measuring 4.4 x 4.7 x 3.3 cm.

Pulsed Doppler evaluation of both ovaries demonstrates normal
low-resistance arterial and venous waveforms.

Other findings

No abnormal free fluid.
IMPRESSION: 1. Complex hypoechoic tubular structures in the bilateral adnexa
worrisome for dilated fallopian tubes, either pyosalpinx or
hematosalpinx.
2. Small amount of fluid in the endometrial canal.
3. Normal appearance of ovaries.

## 2021-04-04 IMAGING — DX DG CHEST 1V PORT
1 series · 1 of 1 positions shown · non-contrast
Comparison: Chest x-ray [DATE].

CLINICAL DATA: Fever and cough.

EXAM:
PORTABLE CHEST 1 VIEW

[chest]
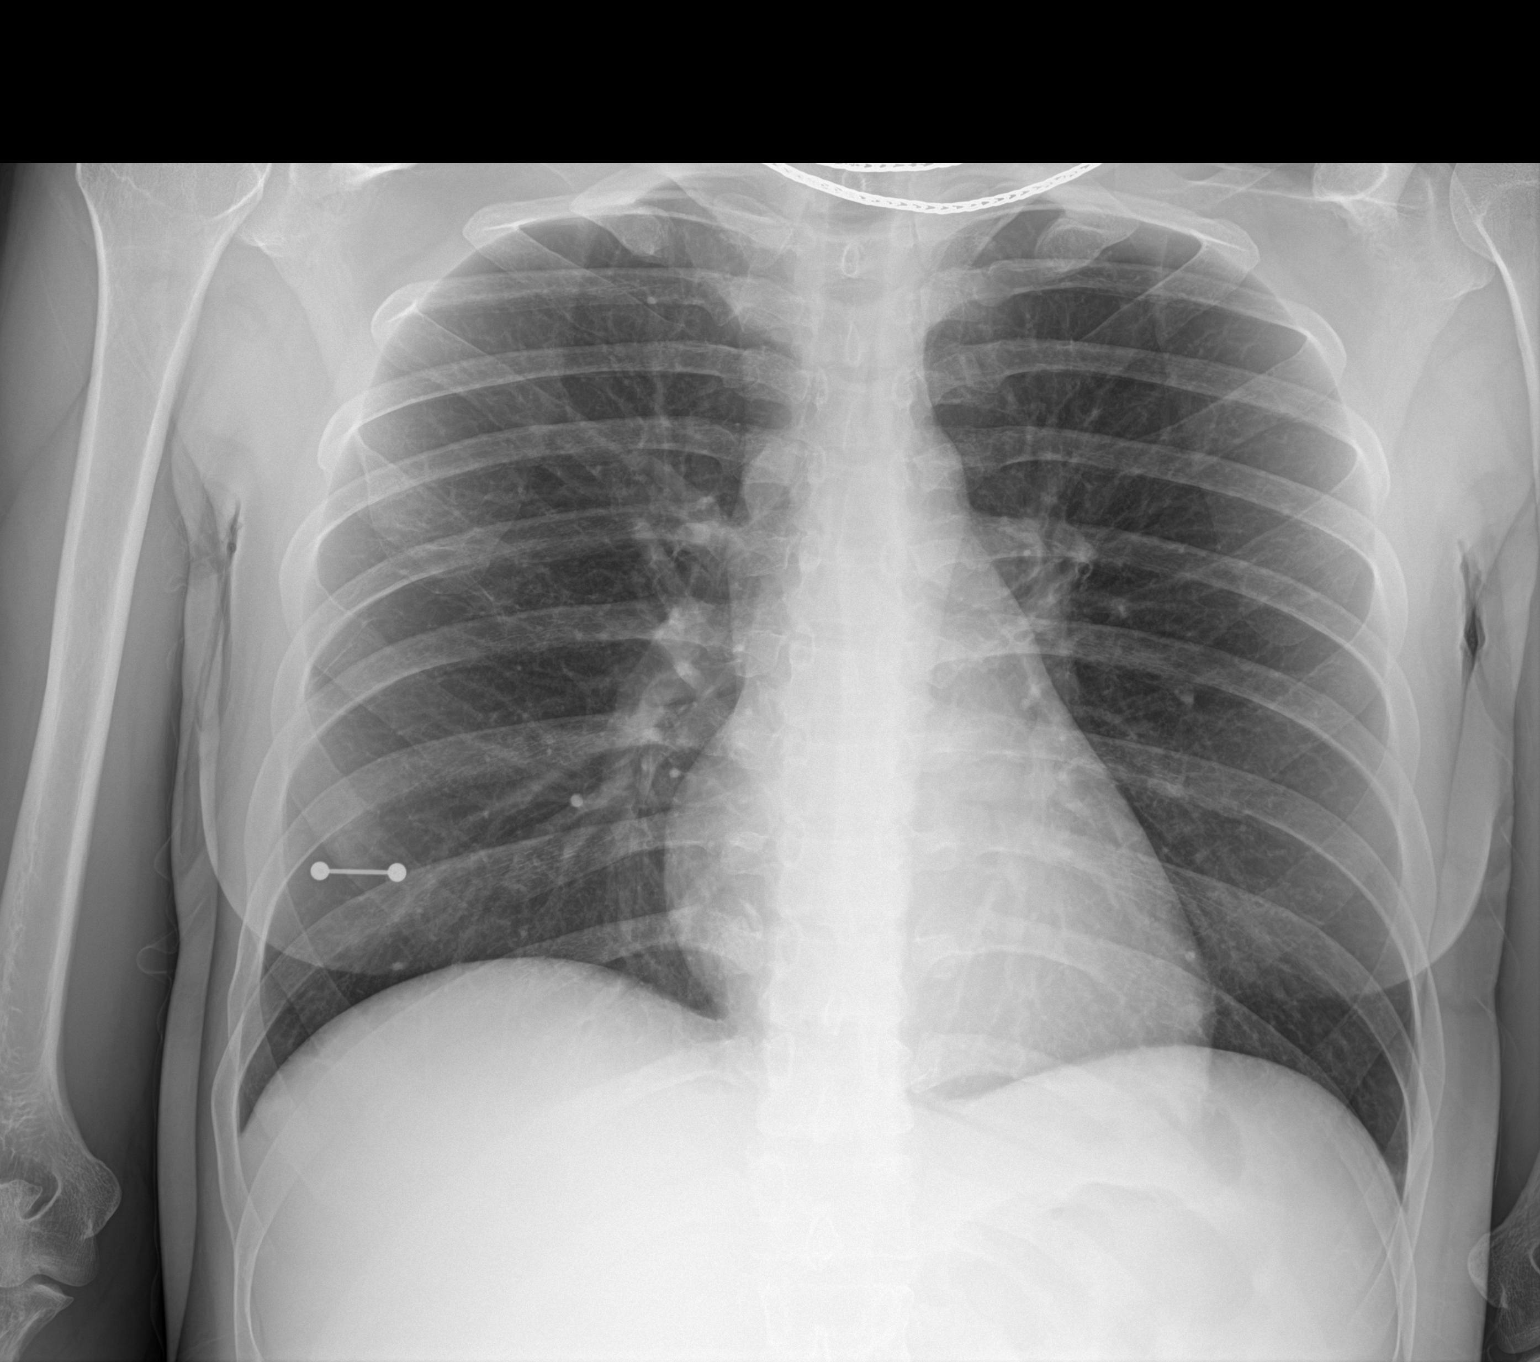

[1 of 1 positions shown; findings below may reference images not displayed]

FINDINGS: The heart size and mediastinal contours are within normal limits.
Both lungs are clear. The visualized skeletal structures are
unremarkable. Piercing overlies the right chest.
IMPRESSION: No active disease.

## 2021-04-04 IMAGING — CT CT ABD-PELV W/ CM
2 of 4 series · 15 of 46 positions shown, 17 images · IV contrast (APPLIED)
Comparison: [DATE]

CLINICAL DATA: Pain right lower quadrant

EXAM:
CT ABDOMEN AND PELVIS WITH CONTRAST
TECHNIQUE: Multidetector CT imaging of the abdomen and pelvis was performed
using the standard protocol following bolus administration of
intravenous contrast.
CONTRAST:  80mL OMNIPAQUE IOHEXOL 300 MG/ML  SOLN

[Series 3: abdomen 5.0 · axial · 0.67mm/px · z∈[-707,-292]mm · 12 of 95 slices shown, 14 images]
[im 6/95  soft-tissue]
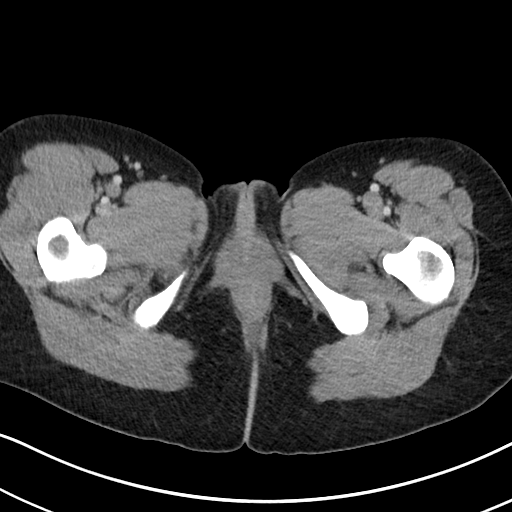
[im 6/95  bone]
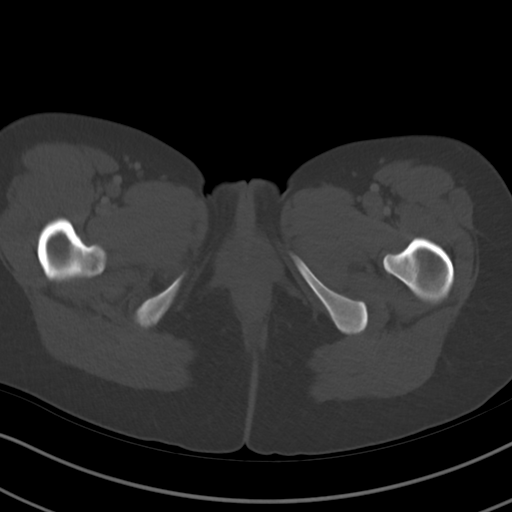
[im 16/95  soft-tissue]
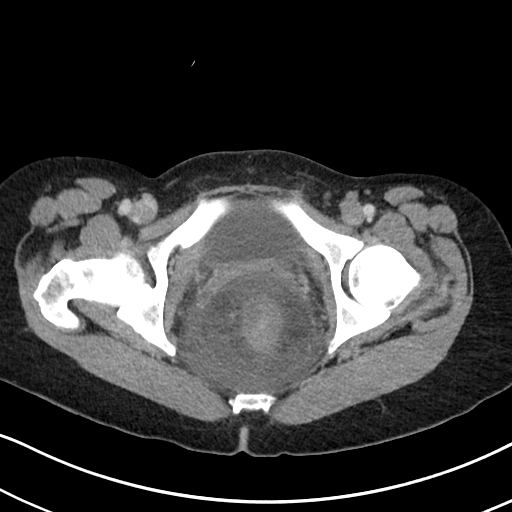
[im 21/95  soft-tissue]
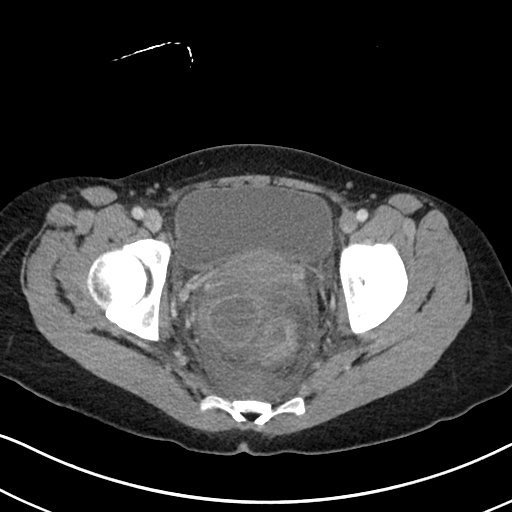
[im 27/95  soft-tissue]
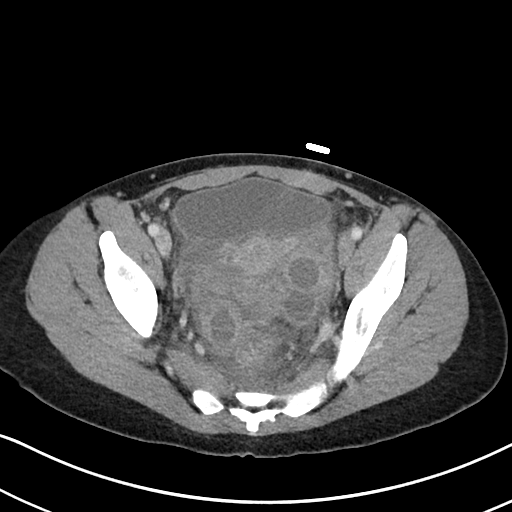
[im 37/95  soft-tissue]
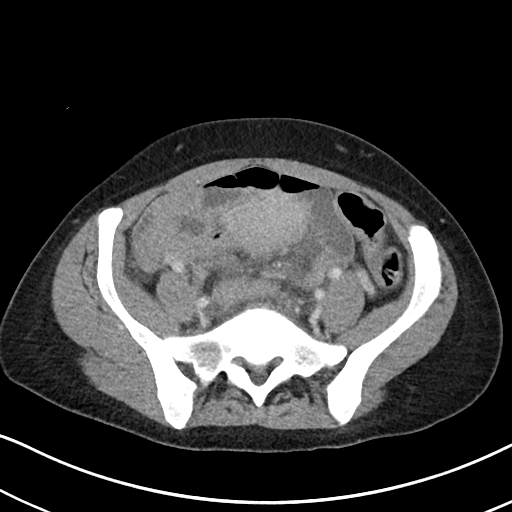
[im 42/95  soft-tissue]
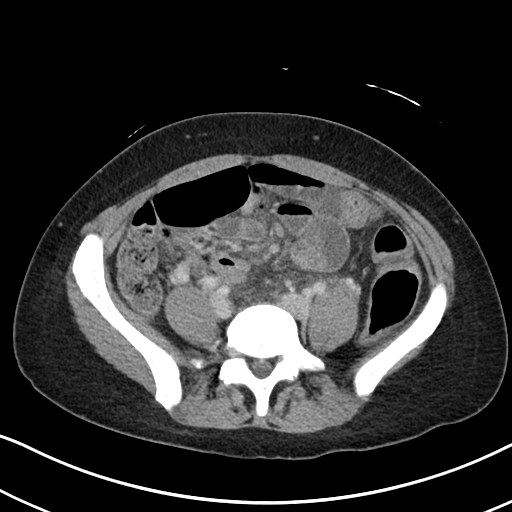
[im 53/95  soft-tissue]
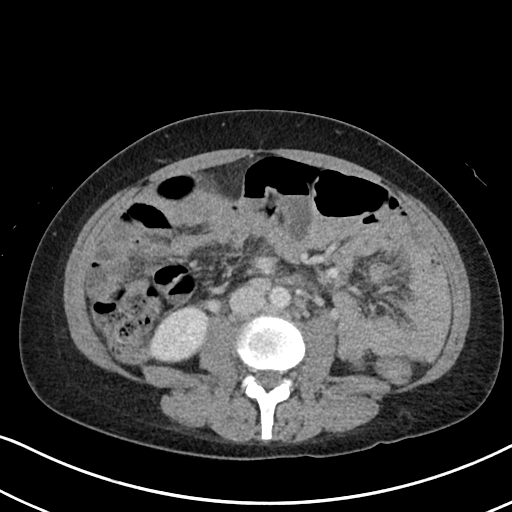
[im 58/95  soft-tissue]
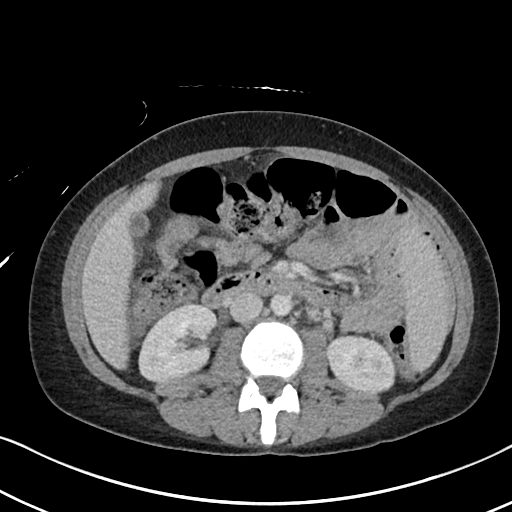
[im 68/95  soft-tissue]
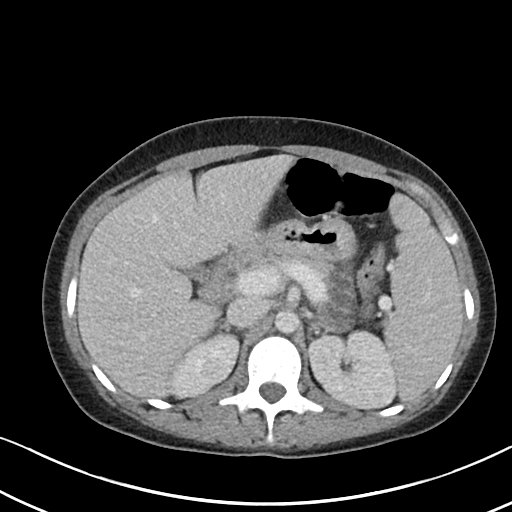
[im 68/95  bone]
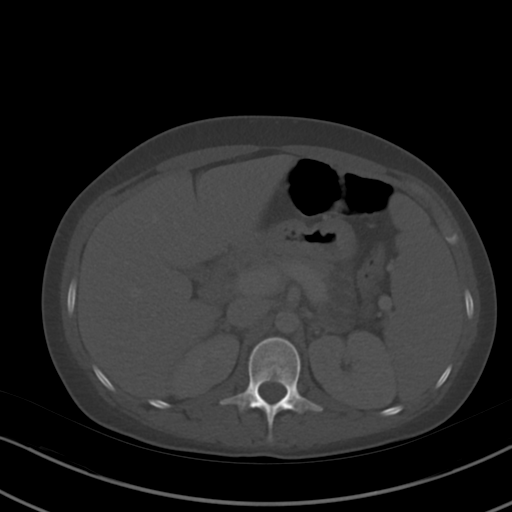
[im 74/95  soft-tissue]
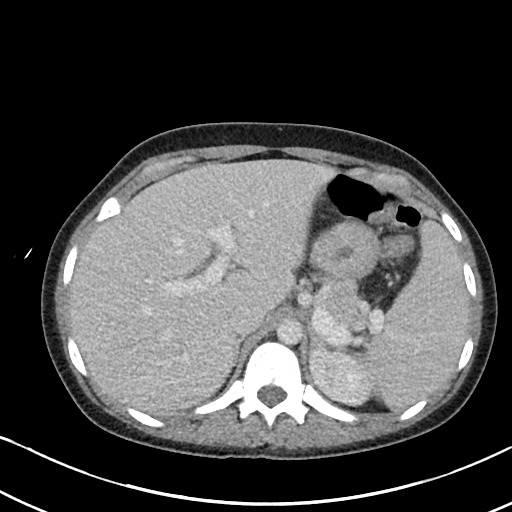
[im 79/95  soft-tissue]
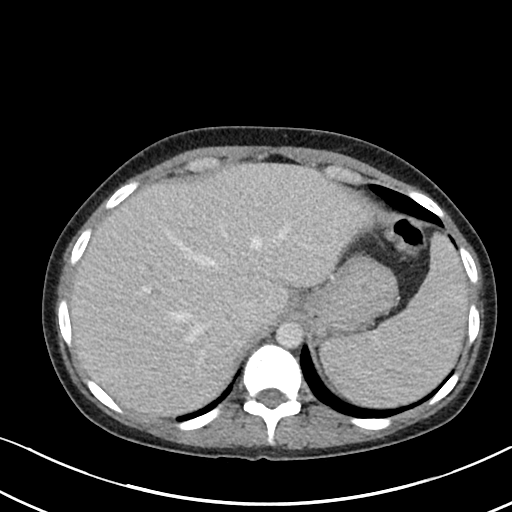
[im 89/95  soft-tissue]
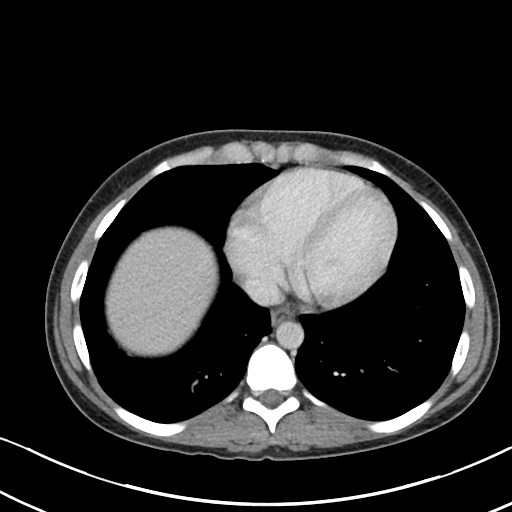

[Series 6: abdomen 3.0 mpr cor · coronal · 0.91mm/px · 3 of 81 slices shown]
[im 27/81  soft-tissue]
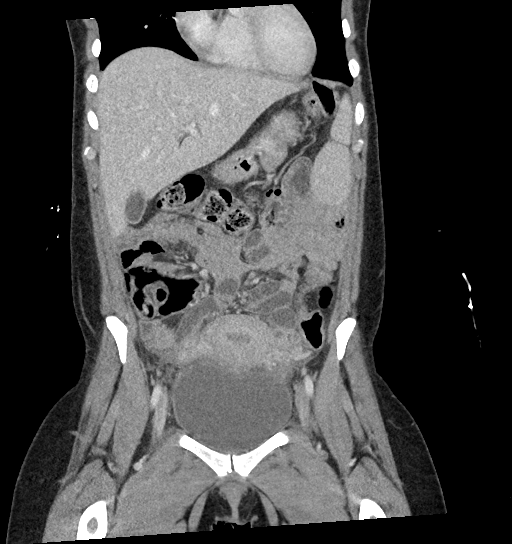
[im 36/81  soft-tissue]
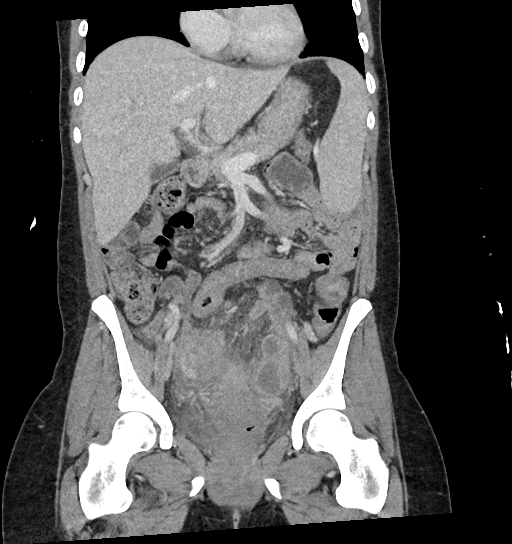
[im 45/81  soft-tissue]
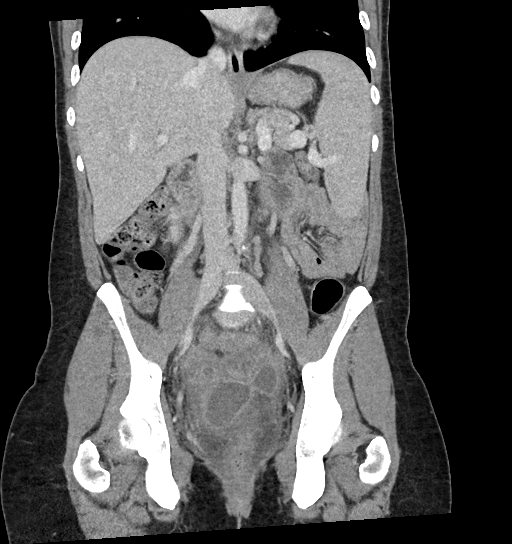

[15 of 46 positions shown; findings below may reference images not displayed]

FINDINGS: Lower chest: No significant abnormality is seen

Hepatobiliary: Liver is enlarged measuring 19.4 cm in length. No
focal abnormality is seen. Gallbladder is not distended

Pancreas: No focal abnormality is seen.

Spleen: Spleen measures 15.7 cm in maximum diameter.

Adrenals/Urinary Tract: Adrenals are not enlarged. There is no
hydronephrosis. There are no renal or ureteral stones. Urinary
bladder is unremarkable.

Stomach/Bowel: Stomach is not distended. Small bowel loops are not
dilated. Appendix is not distinctly seen. There is no pericecal
inflammation. There is no significant wall thickening in the
ascending, transverse and descending colon. There is incomplete
distention of rectosigmoid. There is abnormal diffuse wall
thickening in the rectosigmoid. There is moderate amount perirectal
fluid collection. There are serpiginous fluid-filled structures
posterior to the uterus measuring up to 3.5 cm in diameter. There is
fluid filled structures could not be separately identified from the
adnexal regions. There is wall thickening. There are small pockets
of air in this region which may lie within a collapsed small bowel
loop or suggest extraluminal air. There is no evidence of
pneumoperitoneum in the abdomen. There is anterior displacement of
uterus.

Vascular/Lymphatic: Vascular structures are unremarkable.

Reproductive: Uterus is unremarkable. There are serpiginous
fluid-filled structures posterior to the uterus with thick wall as
described above. There is small to moderate amount of free fluid in
the pelvis.

Other: There is no pneumoperitoneum in the abdomen.

Musculoskeletal: Degenerative changes are noted at the L5-S1 level.
IMPRESSION: There is no evidence intestinal obstruction. There is no
hydronephrosis. Appendix is not distinctly visualized. There is no
focal pericecal inflammation.

There is marked inflammation in the pelvic cavity which may suggest
inflammatory or infectious colitis and proctitis. There are
serpiginous fluid-filled structures measuring up to 3.5 cm in
diameter between the rectum and uterus. Differential diagnostic
possibilities would include PID with pyosalpinx or inflamed distal
small bowel loops. There are small pockets of air between rectum and
uterus in the some of the images which may be lying within a
collapsed small bowel loop or free air due to bowel perforation.
Please correlate with clinical physical examination and laboratory
findings. If further characterization is needed, one could consider
repeat examination with oral and rectal contrast or pelvic sonogram.

Enlarged liver and spleen.

Imaging findings were relayed to Dr. BASTOLA by telephone call.

## 2021-04-04 MED ORDER — M.V.I. ADULT IV INJ
Freq: Once | INTRAVENOUS | Status: AC
  Filled 2021-04-04: qty 1000

## 2021-04-04 MED ORDER — SODIUM CHLORIDE 0.9 % IV SOLN: 100.0000 mg | Freq: Two times a day (BID) | INTRAVENOUS | Status: DC

## 2021-04-04 MED ORDER — SODIUM CHLORIDE 0.9 % IV SOLN: 100.0000 mg | Freq: Once | INTRAVENOUS | Status: DC

## 2021-04-04 MED ORDER — LACTATED RINGERS IV SOLN: INTRAVENOUS | Status: AC

## 2021-04-04 MED ORDER — VANCOMYCIN HCL IN DEXTROSE 1-5 GM/200ML-% IV SOLN: 1000.0000 mg | Freq: Two times a day (BID) | INTRAVENOUS | Status: DC

## 2021-04-04 MED ORDER — IOHEXOL 300 MG/ML  SOLN
80.0000 mL | Freq: Once | INTRAMUSCULAR | Status: AC | PRN
  Administered 2021-04-04: 80 mL via INTRAVENOUS

## 2021-04-04 MED ORDER — KETOROLAC TROMETHAMINE 15 MG/ML IJ SOLN
15.0000 mg | Freq: Once | INTRAMUSCULAR | Status: AC
  Administered 2021-04-04: 15 mg via INTRAVENOUS
  Filled 2021-04-04: qty 1

## 2021-04-04 MED ORDER — LACTATED RINGERS IV BOLUS (SEPSIS)
1000.0000 mL | Freq: Once | INTRAVENOUS | Status: AC
  Administered 2021-04-04: 1000 mL via INTRAVENOUS

## 2021-04-04 MED ORDER — KETOROLAC TROMETHAMINE 30 MG/ML IJ SOLN
30.0000 mg | Freq: Four times a day (QID) | INTRAMUSCULAR | Status: DC
  Administered 2021-04-05 – 2021-04-07 (×8): 30 mg via INTRAVENOUS
  Filled 2021-04-04 (×8): qty 1

## 2021-04-04 MED ORDER — SODIUM CHLORIDE 0.9 % IV SOLN: 2.0000 g | Freq: Three times a day (TID) | INTRAVENOUS | Status: DC

## 2021-04-04 MED ORDER — METRONIDAZOLE 500 MG/100ML IV SOLN
500.0000 mg | Freq: Once | INTRAVENOUS | Status: AC
  Administered 2021-04-04: 500 mg via INTRAVENOUS
  Filled 2021-04-04: qty 100

## 2021-04-04 MED ORDER — SODIUM CHLORIDE 0.9 % IV SOLN
2.0000 g | Freq: Two times a day (BID) | INTRAVENOUS | Status: DC
  Administered 2021-04-05: 2 g via INTRAVENOUS
  Filled 2021-04-04 (×2): qty 2

## 2021-04-04 MED ORDER — ONDANSETRON HCL 4 MG PO TABS: 4.0000 mg | ORAL_TABLET | Freq: Four times a day (QID) | ORAL | Status: DC | PRN

## 2021-04-04 MED ORDER — SODIUM CHLORIDE 0.9 % IV SOLN
2.0000 g | Freq: Once | INTRAVENOUS | Status: AC
  Administered 2021-04-04: 2 g via INTRAVENOUS
  Filled 2021-04-04: qty 2

## 2021-04-04 MED ORDER — ACETAMINOPHEN 325 MG PO TABS
650.0000 mg | ORAL_TABLET | ORAL | Status: DC | PRN
  Administered 2021-04-04: 650 mg via ORAL
  Filled 2021-04-04: qty 2

## 2021-04-04 MED ORDER — SODIUM CHLORIDE 0.9 % IV BOLUS
1000.0000 mL | Freq: Once | INTRAVENOUS | Status: AC
  Administered 2021-04-04: 1000 mL via INTRAVENOUS

## 2021-04-04 MED ORDER — TRAMADOL HCL 50 MG PO TABS
50.0000 mg | ORAL_TABLET | Freq: Four times a day (QID) | ORAL | Status: DC | PRN
  Administered 2021-04-06 (×2): 50 mg via ORAL
  Filled 2021-04-04 (×2): qty 1

## 2021-04-04 MED ORDER — ONDANSETRON HCL 4 MG/2ML IJ SOLN
4.0000 mg | Freq: Four times a day (QID) | INTRAMUSCULAR | Status: DC | PRN
  Administered 2021-04-06: 07:00:00 4 mg via INTRAVENOUS
  Filled 2021-04-04: qty 2

## 2021-04-04 MED ORDER — VANCOMYCIN HCL 1.25 G IV SOLR
1250.0000 mg | Freq: Once | INTRAVENOUS | Status: DC
  Administered 2021-04-04: 1250 mg via INTRAVENOUS
  Filled 2021-04-04: qty 25

## 2021-04-04 MED ORDER — HYDROXYZINE HCL 50 MG/ML IM SOLN
25.0000 mg | Freq: Four times a day (QID) | INTRAMUSCULAR | Status: DC | PRN
  Filled 2021-04-04: qty 0.5

## 2021-04-04 MED ORDER — IBUPROFEN 400 MG PO TABS
600.0000 mg | ORAL_TABLET | Freq: Once | ORAL | Status: DC
  Filled 2021-04-04: qty 1

## 2021-04-04 NOTE — ED Notes (Signed)
Pt to US via stretcher

## 2021-04-04 NOTE — Progress Notes (Addendum)
Pharmacy Antibiotic Note  Christy Pearson is a 30 y.o. female admitted on 04/04/2021 with  intra-abdominal infection .  Pharmacy has been consulted for cefepime and vancomycin dosing.  Patient with a history of substance abuse disorder (heroin), endocarditis, Hep c. Patient presenting with rectal pain.  SCr 0.61  WBC 11; T 99.5 F  Plan: Metronidazole per MD Cefepime 2g q8hr Vancomycin 1250 mg once then 1000 mg q12hr (eAUC 522.5) unless change in renal function Trend WBC, Fever, Renal function, & Clinical course F/u cultures, clinical course, WBC, fever De-escalate when able Levels at steady state  Addendum: Cefotetan + doxy for PID - D/C vanc and cefepime per EDP     Temp (24hrs), Avg:99.5 F (37.5 C), Min:99.5 F (37.5 C), Max:99.5 F (37.5 C)  Recent Labs  Lab 04/04/21 1353  WBC 11.0*  CREATININE 0.61    CrCl cannot be calculated (Unknown ideal weight.).    Allergies  Allergen Reactions   Tylenol [Acetaminophen] Rash    Pt just reported tylenol allergy at this visit at 2338pm   Antimicrobials this admission: cefepime 12/16 >> 12/16 vancomycin 12/16 >> 12/16 metronidazole 12/16 >> 12/16 Cefotetan 12/17 >> Doxy 12/16 >>  Microbiology results: Pending  Thank you for allowing pharmacy to be a part of this patients care.  Delmar Landau, PharmD, BCPS 04/04/2021 7:13 PM ED Clinical Pharmacist -  [phone]

## 2021-04-04 NOTE — ED Provider Notes (Addendum)
Emergency Medicine Provider Triage Evaluation Note  Christy Pearson , a 30 y.o. female  was evaluated in triage.  Pt complains of rectal pain.  Started about a week ago, comes and goes.  Worsened by sitting and standing.  Patient has a history of substance abuse disorder (heroin), endocarditis, Hep c.   Review of Systems  Positive: Rectal pain Negative: AP  Physical Exam  There were no vitals taken for this visit. Gen:   Awake, no distress   Resp:  Normal effort  MSK:   Moves extremities without difficulty  Other:  Visual external rectal exam performed with chaperone in room.  Patient does have some dried blood on both cheeks surrounding the rectum, unclear the site of origin given she is also on her menstrual cycle.  External hemorrhoid present, not actively bleeding.  Unable to visualize any abscess or thrombosed hemorrhoids.  No anal fissure.  Medical Decision Making  Medically screening exam initiated at 1:43 PM.  Appropriate orders placed.  Christy Pearson was informed that the remainder of the evaluation will be completed by another provider, this initial triage assessment does not replace that evaluation, and the importance of remaining in the ED until their evaluation is complete.  Stable vitals.  Elevated temperature but not tachycardic or febrile.  Unclear the source of her rectal pain based on just the external exam.  Basic labs for Cr.    Theron Arista, PA-C 04/04/21 1351    Theron Arista, PA-C 04/04/21 1352    Rozelle Logan, DO 04/10/21 1148

## 2021-04-04 NOTE — ED Triage Notes (Signed)
Patient complains of increasing pain and pressure in her rectum that started approximately one week ago. Patient states pain is exacerbated by ambulation and sitting. Patient also states she had a normal menstrual cycle a few weeks ago but started having vaginal bleeding at four days ago.

## 2021-04-04 NOTE — ED Provider Notes (Signed)
[facility] EMERGENCY DEPARTMENT Provider Note   CSN: [id number] Arrival date & time: 04/04/21  1338     History Chief Complaint  Patient presents with   Rectal Pain    Christy Pearson is a 30 y.o. female.  The history is provided by the patient and medical records.  Rectal Bleeding Quality:  Bright red Amount:  Scant Duration:  1 week Timing:  Intermittent Chronicity:  New Context: rectal pain   Pain details:    Quality:  Aching   Severity:  Severe   Timing:  Constant (Improved after defecation) Similar prior episodes: no   Relieved by:  Nothing Worsened by:  Nothing Ineffective treatments:  None tried Associated symptoms: abdominal pain   Associated symptoms: no fever and no vomiting       Past Medical History:  Diagnosis Date   Anemia affecting pregnancy 10/02/2015   H 7.4 10/02/2015. Feraheme given, ferrous sulfate started   Hepatitis C    Heroin abuse ([place])    Kidney stones    Sepsis ([place]) 05/19/2019    Patient Active Problem List   Diagnosis Date Noted   Heroin abuse ([place]) 04/06/2021   Hypotension 04/05/2021   PID (acute pelvic inflammatory disease) 04/04/2021   TOA (tubo-ovarian abscess)/bilateral hydrosalpinges 04/04/2021   Septic arthritis of shoulder, left ([place]) 05/25/2019   Septic arthritis of shoulder, right ([place]) 05/25/2019   PFO (patent foramen ovale)    Endocarditis of tricuspid valve    History of MRSA bacteremia 05/22/2019   Substance abuse ([place])    Septic embolism (H[facility])    Staphylococcal arthritis of right shoulder (H[facility])    Staphylococcal arthritis of left wrist (H[facility])    Septic pulmonary embolism ([place]) 05/19/2019   Suspected endocarditis 05/19/2019   Hepatitis C, chronic ([place]) 10/28/2015   History of RPR test + 10/28/2015    Past Surgical History:  Procedure Laterality Date   BUBBLE STUDY  05/23/2019   Procedure: BUBBLE STUDY;  Surgeon: Nigel Mormon, MD;  Location: [place];  Service: Cardiovascular;;    CESAREAN SECTION  02/02/2012   MULTIPLE EXTRACTIONS WITH ALVEOLOPLASTY N/A 06/23/2019   Procedure: EXTRACTION OF TOOTH #'S 1,2,3,16,17,18,19, 20, 21,29, 30 AND 31 WITH ALVEOLOPLASTY AND GROSS DEBRIDEMENT OF REMAINING TEETH;  Surgeon: Lenn Cal, DDS;  Location: [place];  Service: Oral Surgery;  Laterality: N/A;   RADIOLOGY WITH ANESTHESIA N/A 05/24/2019   Procedure: MRI WITH ANESTHESIA - LUMBAR , SHOULDER;  Surgeon: Radiologist, Medication, MD;  Location: [place];  Service: Radiology;  Laterality: N/A;   SHOULDER ARTHROSCOPY Bilateral 05/25/2019   Procedure: ARTHROSCOPY SHOULDER;  Surgeon: Marchia Bond, MD;  Location: [place];  Service: Orthopedics;  Laterality: Bilateral;   TEE WITHOUT CARDIOVERSION N/A 05/23/2019   Procedure: TRANSESOPHAGEAL ECHOCARDIOGRAM (TEE);  Surgeon: Nigel Mormon, MD;  Location: [facility];  Service: Cardiovascular;  Laterality: N/A;     OB History     Gravida  2   Para  1   Term  1   Preterm  0   AB  0   Living  1      SAB  0   IAB  0   Ectopic  0   Multiple  0   Live Births  1           History reviewed. No pertinent family history.  Social History   Tobacco Use   Smoking status: Every Day    Packs/day: 1.00    Years: 5.00    Pack years:  5.00    Types: Cigarettes   Smokeless tobacco: Never  Substance Use Topics   Alcohol use: No   Drug use: Yes    Types: Heroin    Comment: Heroin, Xanax    Home Medications Prior to Admission medications   Medication Sig Start Date End Date Taking? Authorizing Provider  carvedilol (COREG) 3.125 MG tablet Take 1 tablet (3.125 mg total) by mouth 2 (two) times daily with a meal. Patient not taking: Reported on 04/04/2021 06/30/19   Shelly Coss, MD  ferrous sulfate (FERROUSUL) 325 (65 FE) MG tablet Take 1 tablet (325 mg total) by mouth 2 (two) times daily. Patient not taking: Reported on 04/04/2021 06/30/19   Shelly Coss, MD  gabapentin (NEURONTIN) 600 MG tablet Take 0.5 tablets  (300 mg total) by mouth 2 (two) times daily. Patient not taking: Reported on 04/04/2021 06/30/19   Shelly Coss, MD  ibuprofen (ADVIL) 600 MG tablet Take 1 tablet (600 mg total) by mouth every 6 (six) hours as needed. Patient not taking: Reported on 04/04/2021 06/30/19   Shelly Coss, MD  magnesium oxide (MAG-OX) 400 (241.3 Mg) MG tablet Take 1 tablet (400 mg total) by mouth 2 (two) times daily. Patient not taking: Reported on 04/04/2021 06/30/19   Shelly Coss, MD  Prenatal Multivit-Min-Fe-FA (PRENATAL VITAMINS) 0.8 MG tablet Take 1 tablet by mouth daily. Patient not taking: Reported on 04/04/2021 06/30/19   Shelly Coss, MD    Allergies    Tylenol [acetaminophen]  Review of Systems   Review of Systems  Constitutional:  Positive for appetite change, chills and fatigue. Negative for fever.  HENT:  Negative for ear pain and sore throat.   Eyes:  Negative for pain and visual disturbance.  Respiratory:  Negative for cough and shortness of breath.   Cardiovascular:  Negative for chest pain and palpitations.  Gastrointestinal:  Positive for abdominal pain, blood in stool, hematochezia and rectal pain. Negative for vomiting.  Genitourinary:  Negative for dysuria and hematuria.  Musculoskeletal:  Negative for arthralgias and back pain.  Skin:  Negative for color change and rash.  Neurological:  Negative for seizures and syncope.  All other systems reviewed and are negative.  Physical Exam Updated Vital Signs BP (!) 97/57 (BP Location: Left Arm) Comment: pt refused retake   Pulse 80    Temp 98.9 F (37.2 C) (Oral)    Resp 15    LMP  (Within Weeks)    SpO2 97%   Physical Exam Vitals and nursing note reviewed.  Constitutional:      General: She is not in acute distress.    Appearance: Normal appearance. She is well-developed. She is ill-appearing.  HENT:     Head: Normocephalic and atraumatic.     Right Ear: External ear normal.     Left Ear: External ear normal.     Nose: Nose  normal. No congestion or rhinorrhea.     Mouth/Throat:     Mouth: Mucous membranes are dry.  Eyes:     Extraocular Movements: Extraocular movements intact.     Conjunctiva/sclera: Conjunctivae normal.     Pupils: Pupils are equal, round, and reactive to light.     Comments: Poor dentition with severe erosive disease  Cardiovascular:     Rate and Rhythm: Regular rhythm. Tachycardia present.     Pulses: Normal pulses.     Heart sounds: No murmur heard. Pulmonary:     Effort: Pulmonary effort is normal. No respiratory distress.     Breath sounds:  Normal breath sounds. No wheezing, rhonchi or rales.  Abdominal:     General: Abdomen is flat. Bowel sounds are normal. There is no distension.     Palpations: Abdomen is soft.     Tenderness: There is abdominal tenderness (diffuse, lower). There is no guarding or rebound.  Genitourinary:    Rectum: Normal. Guaiac result positive.  Musculoskeletal:        General: No swelling, tenderness or deformity.     Cervical back: Normal range of motion and neck supple. No rigidity.     Comments: Various lesions to her inner arms consistent with prior IV drug use  Skin:    General: Skin is warm and dry.     Capillary Refill: Capillary refill takes less than 2 seconds.  Neurological:     General: No focal deficit present.     Mental Status: She is alert and oriented to person, place, and time.     Cranial Nerves: No cranial nerve deficit.     Sensory: No sensory deficit.     Motor: No weakness.     Gait: Gait normal.  Psychiatric:        Mood and Affect: Mood normal.    ED Results / Procedures / Treatments   Labs (all labs ordered are listed, but only abnormal results are displayed) Labs Reviewed  BASIC METABOLIC PANEL - Abnormal; Notable for the following components:      Result Value   Sodium 131 (*)    Chloride 97 (*)    Glucose, Bld 111 (*)    Calcium 8.8 (*)    All other components within normal limits  CBC WITH DIFFERENTIAL/PLATELET  - Abnormal; Notable for the following components:   WBC 11.0 (*)    RBC 3.09 (*)    Hemoglobin 7.5 (*)    HCT 24.1 (*)    MCV 78.0 (*)    MCH 24.3 (*)    Neutro Abs 8.7 (*)    Monocytes Absolute 1.3 (*)    All other components within normal limits  CBC WITH DIFFERENTIAL/PLATELET - Abnormal; Notable for the following components:   RBC 2.60 (*)    Hemoglobin 6.4 (*)    HCT 20.5 (*)    MCV 78.8 (*)    MCH 24.6 (*)    All other components within normal limits  HEMOGLOBIN AND HEMATOCRIT, BLOOD - Abnormal; Notable for the following components:   Hemoglobin 7.0 (*)    HCT 21.3 (*)    All other components within normal limits  CBC WITH DIFFERENTIAL/PLATELET - Abnormal; Notable for the following components:   RBC 3.10 (*)    Hemoglobin 8.2 (*)    HCT 24.5 (*)    MCV 79.0 (*)    Abs Immature Granulocytes 0.17 (*)    All other components within normal limits  COMPREHENSIVE METABOLIC PANEL - Abnormal; Notable for the following components:   Potassium 3.3 (*)    Glucose, Bld 124 (*)    Calcium 7.8 (*)    Total Protein 5.0 (*)    Albumin 2.0 (*)    All other components within normal limits  HEMOGLOBIN AND HEMATOCRIT, BLOOD - Abnormal; Notable for the following components:   Hemoglobin 8.0 (*)    HCT 24.8 (*)    All other components within normal limits  CULTURE, BLOOD (ROUTINE X 2)  RESP PANEL BY RT-PCR (FLU A&B, COVID) ARPGX2  CULTURE, BLOOD (ROUTINE X 2)  HIV ANTIBODY (ROUTINE TESTING W REFLEX)  LACTIC ACID, PLASMA  URINALYSIS,  ROUTINE W REFLEX MICROSCOPIC  HEPATITIS B SURFACE ANTIGEN  RPR  HCV RNA QUANT RFLX ULTRA OR GENOTYP  I-STAT BETA HCG BLOOD, ED ([facility], [facility], [facility] ONLY)  POC OCCULT BLOOD, ED  PREPARE RBC (CROSSMATCH)  TYPE AND SCREEN  GC/CHLAMYDIA PROBE AMP ([place]) NOT AT [facility]  URINE CYTOLOGY ANCILLARY ONLY    EKG None  Radiology US PELVIS (TRANSABDOMINAL ONLY)  Result Date: 04/04/2021 CLINICAL DATA:  Right lower quadrant pain. EXAM: TRANSABDOMINAL AND  TRANSVAGINAL ULTRASOUND OF PELVIS DOPPLER ULTRASOUND OF OVARIES TECHNIQUE: Both transabdominal and transvaginal ultrasound examinations of the pelvis were performed. Transabdominal technique was performed for global imaging of the pelvis including uterus, ovaries, adnexal regions, and pelvic cul-de-sac. It was necessary to proceed with endovaginal exam following the transabdominal exam to visualize the ovaries and endometrium. Color and duplex Doppler ultrasound was utilized to evaluate blood flow to the ovaries. COMPARISON:  CT abdomen and pelvis 04/04/2021 FINDINGS: Uterus Measurements: 12.4 x 3.9 x 5.9 cm = volume: 148 mL. No fibroids or other mass visualized. Endometrium Thickness: 7.1 mm. There is a small amount of fluid in the endometrial canal Right ovary Measurements: 5.5 x 3.3 x 3.2 cm = volume: 31 mL. No ovarian lesion. Complex hypoechoic tubular structure seen in the right adnexa measuring 5.2 x 3.2 x 5.7 cm. Left ovary Measurements: 3.5 x 2.1 x 2.2 cm = volume: 8.5 mL. No ovarian lesion. Complex hypoechoic tubular structure seen in the left adnexa measuring 4.4 x 4.7 x 3.3 cm. Pulsed Doppler evaluation of both ovaries demonstrates normal low-resistance arterial and venous waveforms. Other findings No abnormal free fluid. IMPRESSION: 1. Complex hypoechoic tubular structures in the bilateral adnexa worrisome for dilated fallopian tubes, either pyosalpinx or hematosalpinx. 2. Small amount of fluid in the endometrial canal. 3. Normal appearance of ovaries. Electronically Signed   By: Darliss Cheney M.D.   On: 04/04/2021 20:13   CT ABDOMEN PELVIS W CONTRAST  Result Date: 04/04/2021 CLINICAL DATA:  Pain right lower quadrant EXAM: CT ABDOMEN AND PELVIS WITH CONTRAST TECHNIQUE: Multidetector CT imaging of the abdomen and pelvis was performed using the standard protocol following bolus administration of intravenous contrast. CONTRAST:  62mL OMNIPAQUE IOHEXOL 300 MG/ML  SOLN COMPARISON:  05/19/2019 FINDINGS:  Lower chest: No significant abnormality is seen Hepatobiliary: Liver is enlarged measuring 19.4 cm in length. No focal abnormality is seen. Gallbladder is not distended Pancreas: No focal abnormality is seen. Spleen: Spleen measures 15.7 cm in maximum diameter. Adrenals/Urinary Tract: Adrenals are not enlarged. There is no hydronephrosis. There are no renal or ureteral stones. Urinary bladder is unremarkable. Stomach/Bowel: Stomach is not distended. Small bowel loops are not dilated. Appendix is not distinctly seen. There is no pericecal inflammation. There is no significant wall thickening in the ascending, transverse and descending colon. There is incomplete distention of rectosigmoid. There is abnormal diffuse wall thickening in the rectosigmoid. There is moderate amount perirectal fluid collection. There are serpiginous fluid-filled structures posterior to the uterus measuring up to 3.5 cm in diameter. There is fluid filled structures could not be separately identified from the adnexal regions. There is wall thickening. There are small pockets of air in this region which may lie within a collapsed small bowel loop or suggest extraluminal air. There is no evidence of pneumoperitoneum in the abdomen. There is anterior displacement of uterus. Vascular/Lymphatic: Vascular structures are unremarkable. Reproductive: Uterus is unremarkable. There are serpiginous fluid-filled structures posterior to the uterus with thick wall as described above. There is small to moderate amount  of free fluid in the pelvis. Other: There is no pneumoperitoneum in the abdomen. Musculoskeletal: Degenerative changes are noted at the L5-S1 level. IMPRESSION: There is no evidence intestinal obstruction. There is no hydronephrosis. Appendix is not distinctly visualized. There is no focal pericecal inflammation. There is marked inflammation in the pelvic cavity which may suggest inflammatory or infectious colitis and proctitis. There are  serpiginous fluid-filled structures measuring up to 3.5 cm in diameter between the rectum and uterus. Differential diagnostic possibilities would include PID with pyosalpinx or inflamed distal small bowel loops. There are small pockets of air between rectum and uterus in the some of the images which may be lying within a collapsed small bowel loop or free air due to bowel perforation. Please correlate with clinical physical examination and laboratory findings. If further characterization is needed, one could consider repeat examination with oral and rectal contrast or pelvic sonogram. Enlarged liver and spleen. Imaging findings were relayed to Dr. Dolores Frame by telephone call. Electronically Signed   By: Elmer Picker M.D.   On: 04/04/2021 19:02   US PELVIC DOPPLER (TORSION R/O OR MASS ARTERIAL FLOW)  Result Date: 04/04/2021 CLINICAL DATA:  Right lower quadrant pain. EXAM: TRANSABDOMINAL AND TRANSVAGINAL ULTRASOUND OF PELVIS DOPPLER ULTRASOUND OF OVARIES TECHNIQUE: Both transabdominal and transvaginal ultrasound examinations of the pelvis were performed. Transabdominal technique was performed for global imaging of the pelvis including uterus, ovaries, adnexal regions, and pelvic cul-de-sac. It was necessary to proceed with endovaginal exam following the transabdominal exam to visualize the ovaries and endometrium. Color and duplex Doppler ultrasound was utilized to evaluate blood flow to the ovaries. COMPARISON:  CT abdomen and pelvis 04/04/2021 FINDINGS: Uterus Measurements: 12.4 x 3.9 x 5.9 cm = volume: 148 mL. No fibroids or other mass visualized. Endometrium Thickness: 7.1 mm. There is a small amount of fluid in the endometrial canal Right ovary Measurements: 5.5 x 3.3 x 3.2 cm = volume: 31 mL. No ovarian lesion. Complex hypoechoic tubular structure seen in the right adnexa measuring 5.2 x 3.2 x 5.7 cm. Left ovary Measurements: 3.5 x 2.1 x 2.2 cm = volume: 8.5 mL. No ovarian lesion. Complex hypoechoic  tubular structure seen in the left adnexa measuring 4.4 x 4.7 x 3.3 cm. Pulsed Doppler evaluation of both ovaries demonstrates normal low-resistance arterial and venous waveforms. Other findings No abnormal free fluid. IMPRESSION: 1. Complex hypoechoic tubular structures in the bilateral adnexa worrisome for dilated fallopian tubes, either pyosalpinx or hematosalpinx. 2. Small amount of fluid in the endometrial canal. 3. Normal appearance of ovaries. Electronically Signed   By: Ronney Asters M.D.   On: 04/04/2021 20:13   DG Chest Portable 1 View  Result Date: 04/04/2021 CLINICAL DATA:  Fever and cough. EXAM: PORTABLE CHEST 1 VIEW COMPARISON:  Chest x-ray 06/14/2014. FINDINGS: The heart size and mediastinal contours are within normal limits. Both lungs are clear. The visualized skeletal structures are unremarkable. Piercing overlies the right chest. IMPRESSION: No active disease. Electronically Signed   By: Ronney Asters M.D.   On: 04/04/2021 17:51    Procedures Procedures   Medications Ordered in ED Medications  lactated ringers infusion ( Intravenous New Bag/Given 04/05/21 0641)  acetaminophen (TYLENOL) tablet 650 mg (650 mg Oral Given 04/04/21 2123)  traMADol (ULTRAM) tablet 50 mg (has no administration in time range)  ondansetron (ZOFRAN) injection 4 mg (4 mg Intravenous Given 04/06/21 0703)  ketorolac (TORADOL) 30 MG/ML injection 30 mg (30 mg Intravenous Given 04/06/21 1207)  metroNIDAZOLE (FLAGYL) tablet 500 mg (500 mg  Oral Given 04/06/21 0919)  cefTRIAXone (ROCEPHIN) 2 g in sodium chloride 0.9 % 100 mL IVPB (2 g Intravenous New Bag/Given 04/06/21 1209)  dicyclomine (BENTYL) tablet 20 mg (has no administration in time range)  loperamide (IMODIUM) capsule 2-4 mg (has no administration in time range)  methocarbamol (ROBAXIN) tablet 500 mg (500 mg Oral Given 04/06/21 0918)  ondansetron (ZOFRAN-ODT) disintegrating tablet 4 mg (has no administration in time range)  hydrOXYzine (ATARAX) tablet  25 mg (25 mg Oral Given 04/05/21 1824)  polyethylene glycol (MIRALAX / GLYCOLAX) packet 17 g (17 g Oral Not Given 04/06/21 0922)  bisacodyl (DULCOLAX) EC tablet 5 mg (has no administration in time range)  methadone (DOLOPHINE) tablet 10 mg (10 mg Oral Given 04/06/21 0919)  lactated ringers infusion ( Intravenous New Bag/Given 04/06/21 0918)  potassium chloride SA (KLOR-CON M) CR tablet 40 mEq (has no administration in time range)  iron polysaccharides (NIFEREX) capsule 150 mg (150 mg Oral Given 04/06/21 1207)  doxycycline (VIBRA-TABS) tablet 100 mg (has no administration in time range)  sodium chloride 0.9 % bolus 1,000 mL (0 mLs Intravenous Stopped 04/04/21 1917)  ketorolac (TORADOL) 15 MG/ML injection 15 mg (15 mg Intravenous Given 04/04/21 1749)  iohexol (OMNIPAQUE) 300 MG/ML solution 80 mL (80 mLs Intravenous Contrast Given 04/04/21 1834)  lactated ringers bolus 1,000 mL (0 mLs Intravenous Stopped 04/04/21 2013)    And  lactated ringers bolus 1,000 mL (0 mLs Intravenous Stopped 04/04/21 2041)  ceFEPIme (MAXIPIME) 2 g in sodium chloride 0.9 % 100 mL IVPB (0 g Intravenous Stopped 04/04/21 2006)  metroNIDAZOLE (FLAGYL) IVPB 500 mg (0 mg Intravenous Stopped 04/04/21 2007)  lactated ringers 1,000 mL with M.V.I. Adult (INFUVITE ADULT) 10 mL infusion ( Intravenous Stopped 04/05/21 0752)  ketorolac (TORADOL) 15 MG/ML injection 15 mg (15 mg Intravenous Given 04/04/21 2128)  0.9 %  sodium chloride infusion (Manually program via Guardrails IV Fluids) (0 mLs Intravenous Stopped 04/05/21 1147)  lactated ringers bolus 1,000 mL (1,000 mLs Intravenous New Bag/Given 04/05/21 1703)  potassium chloride SA (KLOR-CON M) CR tablet 40 mEq (40 mEq Oral Given 04/06/21 1207)    ED Course  I have reviewed the triage vital signs and the nursing notes.  Pertinent labs & imaging results that were available during my care of the patient were reviewed by me and considered in my medical decision making (see chart for  details).    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  She is Mildly febrile, but hemodynamically stable.  Does have a history of endocarditis.  Her white count is mildly elevated to 11.  She is anemic to 7.5.  Her creatinine is at baseline.  Her beta hCG is negative.  Low concern for IUP or ectopic pregnancy.  Rectal exam performed and showed no evidence of hemorrhoids or fissures. Dried blood noted in perineum. Patient deferring pelvic exam at this time.  I am concerned for possible intra-abdominal or pelvic infection.  Patient given normal saline bolus.  CT abdomen pelvis obtained.  Did show marked inflammation and stranding of the pelvic cavity.  Tubular structure concerning for possible ascending pelvic infection.  Follow-up pelvic ultrasound obtained.  Concerning for possible pyosalpinx.  Patient started on broad-spectrum antibiotics.  Given additional IV fluids. Blood cultures  pending.  OB/GYN consulted. Will admit for further management of PID. Patient transported out of the ED in stable condition.    Final Clinical Impression(s) / ED Diagnoses Final diagnoses:  Abdominal pain  PID (acute pelvic inflammatory disease)    Rx / DC Orders ED Discharge Orders     None        Idamae Lusher, MD 04/06/21 1211    Malvin Johns, MD 04/06/21 1227

## 2021-04-04 NOTE — ED Notes (Signed)
Pt refused covid test

## 2021-04-04 NOTE — ED Provider Notes (Signed)
I saw and evaluated the patient, reviewed the resident's note and I agree with the findings and plan.      Patient is a 30 year old female with a history of endocarditis/IV drug use who presents with rectal pain.  She is noted to be febrile.  Her blood pressures are soft.  CT scan and ultrasound reveal evidence of fluid collections in the pelvis suspicious for PID/TOA.  Patient was started on IV fluids and antibiotics.  Will be admitted to the OB/GYN service.  CRITICAL CARE Performed by: Rolan Bucco Total critical care time: 60 minutes Critical care time was exclusive of separately billable procedures and treating other patients. Critical care was necessary to treat or prevent imminent or life-threatening deterioration. Critical care was time spent personally by me on the following activities: development of treatment plan with patient and/or surrogate as well as nursing, discussions with consultants, evaluation of patient's response to treatment, examination of patient, obtaining history from patient or surrogate, ordering and performing treatments and interventions, ordering and review of laboratory studies, ordering and review of radiographic studies, pulse oximetry and re-evaluation of patient's condition.    Rolan Bucco, MD 04/04/21 2126

## 2021-04-04 NOTE — ED Notes (Signed)
Pt encouraged  to drink the ordered oral contrast & she has not started drinking it yet, EDP made aware. PIV has been started so contrast can be still be done for CT.

## 2021-04-04 NOTE — H&P (Signed)
[facility] Attending History and Physical  Christy Pearson is a 30 y.o. [phone] who presents due to pelvic/rectal pain.  She notes symptoms have been going on for about a week, but progressively worse today.  Rate her pain 10.  Also notes considerable gas pain.  She also notes clear to white discharge and vaginal bleeding- dark blood/moderate amount.  Per patient her period was a week ago and then started bleeding when her pain occurred.    Patient states last sexually activity was about a month ago.  She states she normally has a BM daily; however, she has not gone in 3 days.  She notes fever/chills.  Denies SOB/chest pain.  Decreased appetite.  Denies any abnormal vaginal discharge, fevers, chills, sweats, dysuria, nausea, vomiting, other GI or GU symptoms or other general symptoms.  Denies contraception, reports that typically when she is using heroin she doesn't get pregnant.  Past Medical History:  Diagnosis Date   Hepatitis C    Heroin abuse (HCC)    Kidney stones    Past Surgical History:  Procedure Laterality Date   BUBBLE STUDY  05/23/2019   Procedure: BUBBLE STUDY;  Surgeon: Elder Negus, MD;  Location: [facility];  Service: Cardiovascular;;   CESAREAN SECTION  02/02/2012   MULTIPLE EXTRACTIONS WITH ALVEOLOPLASTY N/A 06/23/2019   Procedure: EXTRACTION OF TOOTH #'S 1,2,3,16,17,18,19, 20, 21,29, 30 AND 31 WITH ALVEOLOPLASTY AND GROSS DEBRIDEMENT OF REMAINING TEETH;  Surgeon: Charlynne Pander, DDS;  Location: [facility] OR;  Service: Oral Surgery;  Laterality: N/A;   RADIOLOGY WITH ANESTHESIA N/A 05/24/2019   Procedure: MRI WITH ANESTHESIA - LUMBAR , SHOULDER;  Surgeon: Radiologist, Medication, MD;  Location: [facility] OR;  Service: Radiology;  Laterality: N/A;   SHOULDER ARTHROSCOPY Bilateral 05/25/2019   Procedure: ARTHROSCOPY SHOULDER;  Surgeon: Teryl Lucy, MD;  Location: [facility] OR;  Service: Orthopedics;  Laterality: Bilateral;   TEE WITHOUT CARDIOVERSION  N/A 05/23/2019   Procedure: TRANSESOPHAGEAL ECHOCARDIOGRAM (TEE);  Surgeon: Elder Negus, MD;  Location: [facility];  Service: Cardiovascular;  Laterality: N/A;   OB History  Gravida Para Term Preterm AB Living  2 1 1  0 0 1  SAB IAB Ectopic Multiple Live Births  0 0 0 0 1    # Outcome Date GA Lbr Len/2nd Weight Sex Delivery Anes PTL Lv  2 Gravida           1 Term 02/02/12 [redacted]w[redacted]d  3487 g M CS-Unspec Spinal N LIV  2- C-section for twins  No current facility-administered medications on file prior to encounter.   Current Outpatient Medications on File Prior to Encounter  Medication Sig Dispense Refill   carvedilol (COREG) 3.125 MG tablet Take 1 tablet (3.125 mg total) by mouth 2 (two) times daily with a meal. (Patient not taking: Reported on 04/04/2021) 60 tablet 1   ferrous sulfate (FERROUSUL) 325 (65 FE) MG tablet Take 1 tablet (325 mg total) by mouth 2 (two) times daily. (Patient not taking: Reported on 04/04/2021) 120 tablet 0   gabapentin (NEURONTIN) 600 MG tablet Take 0.5 tablets (300 mg total) by mouth 2 (two) times daily. (Patient not taking: Reported on 04/04/2021) 60 tablet 0   ibuprofen (ADVIL) 600 MG tablet Take 1 tablet (600 mg total) by mouth every 6 (six) hours as needed. (Patient not taking: Reported on 04/04/2021) 30 tablet 0   magnesium oxide (MAG-OX) 400 (241.3 Mg) MG tablet Take 1 tablet (400 mg total) by mouth 2 (two) times daily. (Patient not taking:  Reported on 04/04/2021) 60 tablet 1   Prenatal Multivit-Min-Fe-FA (PRENATAL VITAMINS) 0.8 MG tablet Take 1 tablet by mouth daily. (Patient not taking: Reported on 04/04/2021) 60 tablet 0   Allergies  Allergen Reactions   Tylenol [Acetaminophen] Rash    Pt just reported tylenol allergy at this visit at 2338pm    Social History:   reports that she has been smoking cigarettes. She has a 5.00 pack-year smoking history. She has never used smokeless tobacco. She reports current drug use. Drug: Heroin. She reports that  she does not drink alcohol. History reviewed. No pertinent family history.  Review of Systems: Pertinent items noted in HPI and remainder of comprehensive ROS otherwise negative.  PHYSICAL EXAM: Blood pressure (!) 94/51, pulse 100, temperature (!) 102.6 F (39.2 C), temperature source Oral, resp. rate 13, SpO2 97 %, unknown if currently breastfeeding. CONSTITUTIONAL: Appears ill and acute uncomfortably HENT: poor dentition noted EYES: Pupils are equal, round, and reactive to light. No scleral icterus.  NECK: Normal range of motion, supple, no masses SKIN: Skin is warm and dry. No rash noted. Not diaphoretic. No erythema. No pallor. NEUROLOGIC: Alert and oriented to person, place, and time. Normal reflexes, muscle tone coordination. No cranial nerve deficit noted. PSYCHIATRIC: Normal mood and affect. Normal behavior. Normal judgment and thought content. CARDIOVASCULAR: Normal heart rate noted, regular rhythm RESPIRATORY: Effort and breath sounds normal, no problems with respiration noted ABDOMEN: Soft, nontender, nondistended, no rebound, no guarding PELVIC: normal external genitalia, vagina- pink moist mucosa, minimal discharge with blood noted, difficulty visualizing cervix due to discomfort.  On bimanual exam- uterus palpated- normal size, diffuse tenderness appreciated, adenxal fullness appreciated MUSCULOSKELETAL: Normal range of motion. No tenderness.  No cyanosis, clubbing, or edema.  2+ distal pulses.  Labs: Results for orders placed or performed during the hospital encounter of 04/04/21 (from the past 336 hour(s))  Basic metabolic panel   Collection Time: 04/04/21  1:53 PM  Result Value Ref Range   Sodium 131 (L) 135 - 145 mmol/L   Potassium 4.0 3.5 - 5.1 mmol/L   Chloride 97 (L) 98 - 111 mmol/L   CO2 23 22 - 32 mmol/L   Glucose, Bld 111 (H) 70 - 99 mg/dL   BUN 8 6 - 20 mg/dL   Creatinine, Ser 0.61 0.44 - 1.00 mg/dL   Calcium 8.8 (L) 8.9 - 10.3 mg/dL   GFR, Estimated >60 >60  mL/min   Anion gap 11 5 - 15  CBC with Differential   Collection Time: 04/04/21  1:53 PM  Result Value Ref Range   WBC 11.0 (H) 4.0 - 10.5 K/uL   RBC 3.09 (L) 3.87 - 5.11 MIL/uL   Hemoglobin 7.5 (L) 12.0 - 15.0 g/dL   HCT 24.1 (L) 36.0 - 46.0 %   MCV 78.0 (L) 80.0 - 100.0 fL   MCH 24.3 (L) 26.0 - 34.0 pg   MCHC 31.1 30.0 - 36.0 g/dL   RDW 14.4 11.5 - 15.5 %   Platelets 315 150 - 400 K/uL   nRBC 0.0 0.0 - 0.2 %   Neutrophils Relative % 79 %   Neutro Abs 8.7 (H) 1.7 - 7.7 K/uL   Lymphocytes Relative 8 %   Lymphs Abs 0.9 0.7 - 4.0 K/uL   Monocytes Relative 12 %   Monocytes Absolute 1.3 (H) 0.1 - 1.0 K/uL   Eosinophils Relative 0 %   Eosinophils Absolute 0.0 0.0 - 0.5 K/uL   Basophils Relative 0 %   Basophils Absolute 0.0 0.0 -  0.1 K/uL   Immature Granulocytes 1 %   Abs Immature Granulocytes 0.07 0.00 - 0.07 K/uL  I-Stat beta hCG blood, ED   Collection Time: 04/04/21  2:32 PM  Result Value Ref Range   I-stat hCG, quantitative <5.0 <5 mIU/mL   Comment 3          HIV Antibody (routine testing w rflx)   Collection Time: 04/04/21  5:33 PM  Result Value Ref Range   HIV Screen 4th Generation wRfx Non Reactive Non Reactive    Imaging Studies: US PELVIS (TRANSABDOMINAL ONLY)  Result Date: 04/04/2021 CLINICAL DATA:  Right lower quadrant pain. EXAM: TRANSABDOMINAL AND TRANSVAGINAL ULTRASOUND OF PELVIS DOPPLER ULTRASOUND OF OVARIES TECHNIQUE: Both transabdominal and transvaginal ultrasound examinations of the pelvis were performed. Transabdominal technique was performed for global imaging of the pelvis including uterus, ovaries, adnexal regions, and pelvic cul-de-sac. It was necessary to proceed with endovaginal exam following the transabdominal exam to visualize the ovaries and endometrium. Color and duplex Doppler ultrasound was utilized to evaluate blood flow to the ovaries. COMPARISON:  CT abdomen and pelvis 04/04/2021 FINDINGS: Uterus Measurements: 12.4 x 3.9 x 5.9 cm = volume: 148  mL. No fibroids or other mass visualized. Endometrium Thickness: 7.1 mm. There is a small amount of fluid in the endometrial canal Right ovary Measurements: 5.5 x 3.3 x 3.2 cm = volume: 31 mL. No ovarian lesion. Complex hypoechoic tubular structure seen in the right adnexa measuring 5.2 x 3.2 x 5.7 cm. Left ovary Measurements: 3.5 x 2.1 x 2.2 cm = volume: 8.5 mL. No ovarian lesion. Complex hypoechoic tubular structure seen in the left adnexa measuring 4.4 x 4.7 x 3.3 cm. Pulsed Doppler evaluation of both ovaries demonstrates normal low-resistance arterial and venous waveforms. Other findings No abnormal free fluid.   IMPRESSION: 1. Complex hypoechoic tubular structures in the bilateral adnexa worrisome for dilated fallopian tubes, either pyosalpinx or hematosalpinx. 2. Small amount of fluid in the endometrial canal. 3. Normal appearance of ovaries. Electronically Signed   By: Darliss Cheney M.D.   On: 04/04/2021 20:13   CT ABDOMEN PELVIS W CONTRAST  Result Date: 04/04/2021 CLINICAL DATA:  Pain right lower quadrant EXAM: CT ABDOMEN AND PELVIS WITH CONTRAST TECHNIQUE: Multidetector CT imaging of the abdomen and pelvis was performed using the standard protocol following bolus administration of intravenous contrast. CONTRAST:  23mL OMNIPAQUE IOHEXOL 300 MG/ML  SOLN COMPARISON:  05/19/2019 FINDINGS: Lower chest: No significant abnormality is seen Hepatobiliary: Liver is enlarged measuring 19.4 cm in length. No focal abnormality is seen. Gallbladder is not distended Pancreas: No focal abnormality is seen. Spleen: Spleen measures 15.7 cm in maximum diameter. Adrenals/Urinary Tract: Adrenals are not enlarged. There is no hydronephrosis. There are no renal or ureteral stones. Urinary bladder is unremarkable. Stomach/Bowel: Stomach is not distended. Small bowel loops are not dilated. Appendix is not distinctly seen. There is no pericecal inflammation. There is no significant wall thickening in the ascending, transverse  and descending colon. There is incomplete distention of rectosigmoid. There is abnormal diffuse wall thickening in the rectosigmoid. There is moderate amount perirectal fluid collection. There are serpiginous fluid-filled structures posterior to the uterus measuring up to 3.5 cm in diameter. There is fluid filled structures could not be separately identified from the adnexal regions. There is wall thickening. There are small pockets of air in this region which may lie within a collapsed small bowel loop or suggest extraluminal air. There is no evidence of pneumoperitoneum in the abdomen. There is anterior displacement  of uterus. Vascular/Lymphatic: Vascular structures are unremarkable. Reproductive: Uterus is unremarkable. There are serpiginous fluid-filled structures posterior to the uterus with thick wall as described above. There is small to moderate amount of free fluid in the pelvis. Other: There is no pneumoperitoneum in the abdomen. Musculoskeletal: Degenerative changes are noted at the L5-S1 level.   IMPRESSION: There is no evidence intestinal obstruction. There is no hydronephrosis. Appendix is not distinctly visualized. There is no focal pericecal inflammation. There is marked inflammation in the pelvic cavity which may suggest inflammatory or infectious colitis and proctitis. There are serpiginous fluid-filled structures measuring up to 3.5 cm in diameter between the rectum and uterus. Differential diagnostic possibilities would include PID with pyosalpinx or inflamed distal small bowel loops. There are small pockets of air between rectum and uterus in the some of the images which may be lying within a collapsed small bowel loop or free air due to bowel perforation. Please correlate with clinical physical examination and laboratory findings. If further characterization is needed, one could consider repeat examination with oral and rectal contrast or pelvic sonogram. Enlarged liver and spleen. Imaging  findings were relayed to Dr. Dolores Frame by telephone call. Electronically Signed   By: Elmer Picker M.D.   On: 04/04/2021 19:02    Assessment: Principal Problem:   PID (acute pelvic inflammatory disease) Rectal pain Polysubstance use Hep C  Plan: -IV Cefoxitin and Doxy -Pain management: scheduled Toradol and Ultram, tylenol prn -GI: zofran prn, advance diet as tolerated -FEN: LR @ 125cc/hr, regular diet -GC/C and blood culture pending -Repeat CBC with diff in am -If she is not showing appropriate improvement with IV antibiotics, may consider IR for drainage.  Images reviewed independently from [place] report- agree with concern for PID -Polysubstance use- will treat symptoms as needed- zofran, vistaril ordered  Janyth Pupa, DO Attending [place], [place] for [employer], [place]

## 2021-04-05 DIAGNOSIS — F11188 Opioid abuse with other opioid-induced disorder: Secondary | ICD-10-CM

## 2021-04-05 DIAGNOSIS — I959 Hypotension, unspecified: Secondary | ICD-10-CM

## 2021-04-05 DIAGNOSIS — N7003 Acute salpingitis and oophoritis: Secondary | ICD-10-CM

## 2021-04-05 DIAGNOSIS — N739 Female pelvic inflammatory disease, unspecified: Secondary | ICD-10-CM

## 2021-04-05 DIAGNOSIS — F1910 Other psychoactive substance abuse, uncomplicated: Secondary | ICD-10-CM

## 2021-04-05 DIAGNOSIS — N730 Acute parametritis and pelvic cellulitis: Secondary | ICD-10-CM

## 2021-04-05 LAB — CBC WITH DIFFERENTIAL/PLATELET
Abs Immature Granulocytes: 0.06 10*3/uL (ref 0.00–0.07)
Basophils Absolute: 0 10*3/uL (ref 0.0–0.1)
Basophils Relative: 0 %
Eosinophils Absolute: 0 10*3/uL (ref 0.0–0.5)
Eosinophils Relative: 0 %
HCT: 20.5 % — ABNORMAL LOW (ref 36.0–46.0)
Hemoglobin: 6.4 g/dL — CL (ref 12.0–15.0)
Immature Granulocytes: 1 %
Lymphocytes Relative: 12 %
Lymphs Abs: 0.9 10*3/uL (ref 0.7–4.0)
MCH: 24.6 pg — ABNORMAL LOW (ref 26.0–34.0)
MCHC: 31.2 g/dL (ref 30.0–36.0)
MCV: 78.8 fL — ABNORMAL LOW (ref 80.0–100.0)
Monocytes Absolute: 0.7 10*3/uL (ref 0.1–1.0)
Monocytes Relative: 10 %
Neutro Abs: 5.5 10*3/uL (ref 1.7–7.7)
Neutrophils Relative %: 77 %
Platelets: 224 10*3/uL (ref 150–400)
RBC: 2.6 MIL/uL — ABNORMAL LOW (ref 3.87–5.11)
RDW: 14.5 % (ref 11.5–15.5)
WBC: 7.2 10*3/uL (ref 4.0–10.5)
nRBC: 0 % (ref 0.0–0.2)

## 2021-04-05 LAB — PREPARE RBC (CROSSMATCH)

## 2021-04-05 LAB — HEMOGLOBIN AND HEMATOCRIT, BLOOD
HCT: 21.3 % — ABNORMAL LOW (ref 36.0–46.0)
HCT: 24.8 % — ABNORMAL LOW (ref 36.0–46.0)
Hemoglobin: 7 g/dL — ABNORMAL LOW (ref 12.0–15.0)
Hemoglobin: 8 g/dL — ABNORMAL LOW (ref 12.0–15.0)

## 2021-04-05 LAB — LACTIC ACID, PLASMA: Lactic Acid, Venous: 1.1 mmol/L (ref 0.5–1.9)

## 2021-04-05 MED ORDER — DICYCLOMINE HCL 20 MG PO TABS
20.0000 mg | ORAL_TABLET | Freq: Four times a day (QID) | ORAL | Status: DC | PRN
  Administered 2021-04-06: 20:00:00 20 mg via ORAL
  Filled 2021-04-05 (×2): qty 1

## 2021-04-05 MED ORDER — DOXYCYCLINE HYCLATE 100 MG PO TABS
100.0000 mg | ORAL_TABLET | Freq: Two times a day (BID) | ORAL | Status: DC
  Administered 2021-04-05 – 2021-04-06 (×3): 100 mg via ORAL
  Filled 2021-04-05 (×3): qty 1

## 2021-04-05 MED ORDER — HYDROXYZINE HCL 25 MG PO TABS
25.0000 mg | ORAL_TABLET | Freq: Four times a day (QID) | ORAL | Status: DC | PRN
  Administered 2021-04-05 – 2021-04-07 (×2): 25 mg via ORAL
  Filled 2021-04-05 (×2): qty 1

## 2021-04-05 MED ORDER — CLONIDINE HCL 0.1 MG PO TABS: 0.1000 mg | ORAL_TABLET | ORAL | Status: DC

## 2021-04-05 MED ORDER — SODIUM CHLORIDE 0.9 % IV SOLN
2.0000 g | INTRAVENOUS | Status: DC
  Administered 2021-04-05 – 2021-04-07 (×3): 2 g via INTRAVENOUS
  Filled 2021-04-05 (×3): qty 20

## 2021-04-05 MED ORDER — LOPERAMIDE HCL 2 MG PO CAPS
2.0000 mg | ORAL_CAPSULE | ORAL | Status: DC | PRN
  Administered 2021-04-06: 20:00:00 4 mg via ORAL
  Filled 2021-04-05: qty 1

## 2021-04-05 MED ORDER — METHOCARBAMOL 500 MG PO TABS
500.0000 mg | ORAL_TABLET | Freq: Three times a day (TID) | ORAL | Status: DC | PRN
  Administered 2021-04-06: 09:00:00 500 mg via ORAL
  Filled 2021-04-05: qty 1

## 2021-04-05 MED ORDER — CLONIDINE HCL 0.1 MG PO TABS: 0.1000 mg | ORAL_TABLET | Freq: Every day | ORAL | Status: DC

## 2021-04-05 MED ORDER — BUPRENORPHINE HCL-NALOXONE HCL 2-0.5 MG SL SUBL
1.0000 | SUBLINGUAL_TABLET | Freq: Every day | SUBLINGUAL | Status: DC
  Filled 2021-04-05: qty 1

## 2021-04-05 MED ORDER — POLYETHYLENE GLYCOL 3350 17 G PO PACK
17.0000 g | PACK | Freq: Two times a day (BID) | ORAL | Status: DC
  Filled 2021-04-05 (×2): qty 1

## 2021-04-05 MED ORDER — HYDROXYZINE HCL 25 MG PO TABS: 25.0000 mg | ORAL_TABLET | Freq: Four times a day (QID) | ORAL | Status: DC | PRN

## 2021-04-05 MED ORDER — METHADONE HCL 10 MG PO TABS
10.0000 mg | ORAL_TABLET | Freq: Every day | ORAL | Status: DC
  Administered 2021-04-06 (×2): 10 mg via ORAL
  Filled 2021-04-05 (×2): qty 1

## 2021-04-05 MED ORDER — BISACODYL 5 MG PO TBEC: 5.0000 mg | DELAYED_RELEASE_TABLET | Freq: Every day | ORAL | Status: DC | PRN

## 2021-04-05 MED ORDER — CLONIDINE HCL 0.1 MG PO TABS
0.1000 mg | ORAL_TABLET | Freq: Four times a day (QID) | ORAL | Status: DC
  Administered 2021-04-05: 0.1 mg via ORAL
  Filled 2021-04-05: qty 1

## 2021-04-05 MED ORDER — ONDANSETRON 4 MG PO TBDP
4.0000 mg | ORAL_TABLET | Freq: Four times a day (QID) | ORAL | Status: DC | PRN
  Administered 2021-04-06: 14:00:00 4 mg via ORAL
  Filled 2021-04-05: qty 1

## 2021-04-05 MED ORDER — SODIUM CHLORIDE 0.9% IV SOLUTION: Freq: Once | INTRAVENOUS | Status: AC

## 2021-04-05 MED ORDER — LACTATED RINGERS IV BOLUS
1000.0000 mL | Freq: Once | INTRAVENOUS | Status: AC
  Administered 2021-04-05: 1000 mL via INTRAVENOUS

## 2021-04-05 MED ORDER — METRONIDAZOLE 500 MG PO TABS
500.0000 mg | ORAL_TABLET | Freq: Two times a day (BID) | ORAL | Status: DC
  Administered 2021-04-05 – 2021-04-07 (×5): 500 mg via ORAL
  Filled 2021-04-05 (×6): qty 1

## 2021-04-05 MED ORDER — OXYCODONE HCL 5 MG PO TABS
5.0000 mg | ORAL_TABLET | ORAL | Status: DC | PRN
  Filled 2021-04-05: qty 1

## 2021-04-05 NOTE — Progress Notes (Signed)
GYN Note Hospitalist team to come by and assess to help with opiate withdrawal s/s and low BP

## 2021-04-05 NOTE — ED Notes (Signed)
6N called for purple man assignment.

## 2021-04-05 NOTE — Progress Notes (Signed)
Pt's current bp is 82/47 (59) with pulse 73. Provider paged. Last dose of clonidine 0.1mg  tablet given at 1147, next scheduled dose held by previous RN at 1450 due to low bp. Waiting for response.

## 2021-04-05 NOTE — Consult Note (Addendum)
[facility] Medical Consultation  Christy Pearson [id number] DOB: 1990/08/16 DOA: 04/04/2021 PCP: Patient, No Pcp Per (Inactive)   Requesting physician: Dr. Ilda Basset  Date of consultation: 04/05/2021 Reason for consultation: opioid withdrawal and hypotension  Impression/Recommendations Principal Problem:   PID (acute pelvic inflammatory disease) Active Problems:   Pelvic inflammatory disease (PID)    Hypotension Appears to be progressively worsening since admission yesterday. Suspect multifactorial from infection, acute on chronic anemia, possible opioid withdrawal. I suspect Clonidine potentially exacerbated her existing hypotension rather than as the primary cause. -Will d/c Clonidine -She is already on appropriate antibiotic coverage with IV Rocephin, Flagyl and doxycycline.  Continue per primary team. -I have ordered repeat H/H which did not show any hemodilution from fluid resuscitation with stable Hgb 8 s/p 2uPRBC  -Last echo on 06/2019 shows EF of 70%. Okay to continue to aggressive fluid resuscitation with bolus and continuous IV NS fluid. - -Monitor intake and out. Maintain systolic [id number] with MAP of 65. If no improvement with fluids will need to assess need for vasopressor.  -Potentially repeat Echo warranted given ongoing IV drug use and hx of endocarditis if no improvement with aggressive IV fluid management. However, blood cultures so far has remained negative.   2. IV  opioid drug use Pt reports ongoing IV heroine use prior to admission. Also has hx of crystal meth use Pt does not appear on exam to be in any acute emergent withdrawal. Last COWS score at 1755 with score of 6 showing mild withdrawal. -Initially placed patient on Suboxone but then she endorsed hx of allergies with hives and requested methadone 10mg  BID.  -will d/c suboxone and start methadone 10mg  daily for now. Will reassess needs for BID dosing based on her symptoms with COWS score.   3. PID with  pyosalpinx  -IV abx management as per primary OB/GYN team     [facility] team will followup again tomorrow. Please contact me if I can be of assistance in the meanwhile. Thank you for this consultation.  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   Hospital course thus far has been complicated by symptomatic anemia requiring 2 unit PRBC and hypotension thought secondary to clonidine administration for opioid withdrawal.  Hospitalist team being consulted for opioid withdrawal management and hypotension.  Patient was laying comfortably in bed at the time of my evaluation.  She reports ongoing IV heroin use prior to admission.  States she is feeling unwell and having symptoms of withdrawal including diaphoresis, yawning and jitteriness.  Reports that she always has low blood pressure when she is going through withdrawal and that she has done well on methadone during her last admission.   She is on IV Rocephin, Flagyl, doxycycline for coverage of her PID pyosalpinx.  Currently on tramadol and Toradol  for pain. Blood pressure was already trending downwards from systolic of 0000000 to [id number] since early in her admission even prior to administration of her Clonidine regimen for withdrawal given at 1147 today and 2upRBC transfusion earlier this AM and fluid resuscitation.   review of Systems:  Pertinent positive and negatives as above as patient provides limited history  Past Medical History:  Diagnosis Date   Hepatitis  C    Heroin abuse (HCC)    Kidney stones    Past Surgical History:  Procedure Laterality Date   BUBBLE STUDY  05/23/2019   Procedure: BUBBLE STUDY;  Surgeon: Elder Negus, MD;   Location: [facility] ENDOSCOPY;  Service: Cardiovascular;;   CESAREAN SECTION  02/02/2012   MULTIPLE EXTRACTIONS WITH ALVEOLOPLASTY N/A 06/23/2019   Procedure: EXTRACTION OF TOOTH #'S 1,2,3,16,17,18,19, 20, 21,29, 30 AND 31 WITH ALVEOLOPLASTY AND GROSS DEBRIDEMENT OF REMAINING TEETH;  Surgeon: Charlynne Pander, DDS;  Location: [facility] OR;  Service: Oral Surgery;  Laterality: N/A;   RADIOLOGY WITH ANESTHESIA N/A 05/24/2019   Procedure: MRI WITH ANESTHESIA - LUMBAR , SHOULDER;  Surgeon: Radiologist, Medication, MD;  Location: [facility] OR;  Service: Radiology;  Laterality: N/A;   SHOULDER ARTHROSCOPY Bilateral 05/25/2019   Procedure: ARTHROSCOPY SHOULDER;  Surgeon: Teryl Lucy, MD;  Location: [facility] OR;  Service: Orthopedics;  Laterality: Bilateral;   TEE WITHOUT CARDIOVERSION N/A 05/23/2019   Procedure: TRANSESOPHAGEAL ECHOCARDIOGRAM (TEE);  Surgeon: Elder Negus, MD;  Location: [facility] ENDOSCOPY;  Service: Cardiovascular;  Laterality: N/A;   Social History:  reports that she has been smoking cigarettes. She has a 5.00 pack-year smoking history. She has never used smokeless tobacco. She reports current drug use. Drug: Heroin. She reports that she does not drink alcohol.  Allergies  Allergen Reactions   Tylenol [Acetaminophen] Rash    Pt just reported tylenol allergy at this visit at 2338pm   History reviewed. No pertinent family history.  Prior to Admission medications   Medication Sig Start Date End Date Taking? Authorizing Provider  carvedilol (COREG) 3.125 MG tablet Take 1 tablet (3.125 mg total) by mouth 2 (two) times daily with a meal. Patient not taking: Reported on 04/04/2021 06/30/19   Burnadette Pop, MD  ferrous sulfate (FERROUSUL) 325 (65 FE) MG tablet Take 1 tablet (325 mg total) by mouth 2 (two) times daily. Patient not taking: Reported on 04/04/2021 06/30/19   Burnadette Pop, MD  gabapentin (NEURONTIN) 600 MG tablet Take 0.5 tablets (300 mg total) by mouth 2 (two) times daily. Patient not taking:  Reported on 04/04/2021 06/30/19   Burnadette Pop, MD  ibuprofen (ADVIL) 600 MG tablet Take 1 tablet (600 mg total) by mouth every 6 (six) hours as needed. Patient not taking: Reported on 04/04/2021 06/30/19   Burnadette Pop, MD  magnesium oxide (MAG-OX) 400 (241.3 Mg) MG tablet Take 1 tablet (400 mg total) by mouth 2 (two) times daily. Patient not taking: Reported on 04/04/2021 06/30/19   Burnadette Pop, MD  Prenatal Multivit-Min-Fe-FA (PRENATAL VITAMINS) 0.8 MG tablet Take 1 tablet by mouth daily. Patient not taking: Reported on 04/04/2021 06/30/19   Burnadette Pop, MD   Physical Exam: Blood pressure (!) 93/56, pulse 80, temperature 98.3 F (36.8 C), temperature source Oral, resp. rate 19, SpO2 97 %, unknown if currently breastfeeding. Vitals:   04/05/21 1822 04/05/21 2023  BP: 114/73 (!) 93/56  Pulse: 86 80  Resp:  19  Temp:  98.3 F (36.8 C)  SpO2:  97%    General: Patient laying comfortably upright in bed with fan on in bed.  Does not appear diaphoretic. Eyes: PERRL, normal lids and conjunctiva ENT: Oral mucosa is moist Neck: Midline Cardiovascular: Regular rate and rhythm, no murmurs rubs or gallops.  No lower extremity edema. Respiratory: Clear to auscultation throughout.  No wheezing, crackles or rhonchi's.  Normal respiration on room air. Abdomen: Soft, nontender, nondistended Skin: No rash lesions or ulcers Musculoskeletal: No obvious deformities.  Good ROS.  Normal muscle mass. Psychiatric: Appears to have good judgment and reasoning.  Normal mood. Neurologic: Alert and oriented.CN 2-12 grossly intact. Sensation intact, Strength 5/5 in all 4.   Labs on Admission:  Basic Metabolic Panel: Recent Labs  Lab 04/04/21 1353  NA 131*  K 4.0  CL 97*  CO2 23  GLUCOSE 111*  BUN 8  CREATININE 0.61  CALCIUM 8.8*   Liver Function Tests: No results for input(s): AST, ALT, ALKPHOS, BILITOT, PROT, ALBUMIN in the last 168 hours. No results for input(s): LIPASE, AMYLASE in the  last 168 hours. No results for input(s): AMMONIA in the last 168 hours. CBC: Recent Labs  Lab 04/04/21 1353 04/05/21 0505 04/05/21 1339  WBC 11.0* 7.2  --   NEUTROABS 8.7* 5.5  --   HGB 7.5* 6.4* 7.0*  HCT 24.1* 20.5* 21.3*  MCV 78.0* 78.8*  --   PLT 315 224  --    Cardiac Enzymes: No results for input(s): CKTOTAL, CKMB, CKMBINDEX, TROPONINI in the last 168 hours. BNP: Invalid input(s): POCBNP CBG: No results for input(s): GLUCAP in the last 168 hours.  Radiological Exams on Admission: US PELVIS (TRANSABDOMINAL ONLY)  Result Date: 04/04/2021 CLINICAL DATA:  Right lower quadrant pain. EXAM: TRANSABDOMINAL AND TRANSVAGINAL ULTRASOUND OF PELVIS DOPPLER ULTRASOUND OF OVARIES TECHNIQUE: Both transabdominal and transvaginal ultrasound examinations of the pelvis were performed. Transabdominal technique was performed for global imaging of the pelvis including uterus, ovaries, adnexal regions, and pelvic cul-de-sac. It was necessary to proceed with endovaginal exam following the transabdominal exam to visualize the ovaries and endometrium. Color and duplex Doppler ultrasound was utilized to evaluate blood flow to the ovaries. COMPARISON:  CT abdomen and pelvis 04/04/2021 FINDINGS: Uterus Measurements: 12.4 x 3.9 x 5.9 cm = volume: 148 mL. No fibroids or other mass visualized. Endometrium Thickness: 7.1 mm. There is a small amount of fluid in the endometrial canal Right ovary Measurements: 5.5 x 3.3 x 3.2 cm = volume: 31 mL. No ovarian lesion. Complex hypoechoic tubular structure seen in the right adnexa measuring 5.2 x 3.2 x 5.7 cm. Left ovary Measurements: 3.5 x 2.1 x 2.2 cm = volume: 8.5 mL. No ovarian lesion. Complex hypoechoic tubular structure seen in the left adnexa measuring 4.4 x 4.7 x 3.3 cm. Pulsed Doppler evaluation of both ovaries demonstrates normal low-resistance arterial and venous waveforms. Other findings No abnormal free fluid. IMPRESSION: 1. Complex hypoechoic tubular structures  in the bilateral adnexa worrisome for dilated fallopian tubes, either pyosalpinx or hematosalpinx. 2. Small amount of fluid in the endometrial canal. 3. Normal appearance of ovaries. Electronically Signed   By: Ronney Asters M.D.   On: 04/04/2021 20:13   CT ABDOMEN PELVIS W CONTRAST  Result Date: 04/04/2021 CLINICAL DATA:  Pain right lower quadrant EXAM: CT ABDOMEN AND PELVIS WITH CONTRAST TECHNIQUE: Multidetector CT imaging of the abdomen and pelvis was performed using the standard protocol following bolus administration of intravenous contrast. CONTRAST:  23mL OMNIPAQUE IOHEXOL 300 MG/ML  SOLN COMPARISON:  05/19/2019 FINDINGS: Lower chest: No significant abnormality is seen Hepatobiliary: Liver is enlarged measuring 19.4 cm in length. No focal abnormality is seen. Gallbladder is not distended Pancreas: No focal abnormality is seen. Spleen: Spleen measures 15.7 cm in maximum diameter. Adrenals/Urinary Tract: Adrenals are not enlarged. There is no hydronephrosis. There are no renal or ureteral stones. Urinary bladder is unremarkable. Stomach/Bowel: Stomach is not distended. Small bowel loops are not dilated. Appendix is not distinctly seen. There is no pericecal inflammation. There is  no significant wall thickening in the ascending, transverse and descending colon. There is incomplete distention of rectosigmoid. There is abnormal diffuse wall thickening in the rectosigmoid. There is moderate amount perirectal fluid collection. There are serpiginous fluid-filled structures posterior to the uterus measuring up to 3.5 cm in diameter. There is fluid filled structures could not be separately identified from the adnexal regions. There is wall thickening. There are small pockets of air in this region which may lie within a collapsed small bowel loop or suggest extraluminal air. There is no evidence of pneumoperitoneum in the abdomen. There is anterior displacement of uterus. Vascular/Lymphatic: Vascular structures are  unremarkable. Reproductive: Uterus is unremarkable. There are serpiginous fluid-filled structures posterior to the uterus with thick wall as described above. There is small to moderate amount of free fluid in the pelvis. Other: There is no pneumoperitoneum in the abdomen. Musculoskeletal: Degenerative changes are noted at the L5-S1 level. IMPRESSION: There is no evidence intestinal obstruction. There is no hydronephrosis. Appendix is not distinctly visualized. There is no focal pericecal inflammation. There is marked inflammation in the pelvic cavity which may suggest inflammatory or infectious colitis and proctitis. There are serpiginous fluid-filled structures measuring up to 3.5 cm in diameter between the rectum and uterus. Differential diagnostic possibilities would include PID with pyosalpinx or inflamed distal small bowel loops. There are small pockets of air between rectum and uterus in the some of the images which may be lying within a collapsed small bowel loop or free air due to bowel perforation. Please correlate with clinical physical examination and laboratory findings. If further characterization is needed, one could consider repeat examination with oral and rectal contrast or pelvic sonogram. Enlarged liver and spleen. Imaging findings were relayed to Dr. Dolores Frame by telephone call. Electronically Signed   By: Elmer Picker M.D.   On: 04/04/2021 19:02   US PELVIC DOPPLER (TORSION R/O OR MASS ARTERIAL FLOW)  Result Date: 04/04/2021 CLINICAL DATA:  Right lower quadrant pain. EXAM: TRANSABDOMINAL AND TRANSVAGINAL ULTRASOUND OF PELVIS DOPPLER ULTRASOUND OF OVARIES TECHNIQUE: Both transabdominal and transvaginal ultrasound examinations of the pelvis were performed. Transabdominal technique was performed for global imaging of the pelvis including uterus, ovaries, adnexal regions, and pelvic cul-de-sac. It was necessary to proceed with endovaginal exam following the transabdominal exam to  visualize the ovaries and endometrium. Color and duplex Doppler ultrasound was utilized to evaluate blood flow to the ovaries. COMPARISON:  CT abdomen and pelvis 04/04/2021 FINDINGS: Uterus Measurements: 12.4 x 3.9 x 5.9 cm = volume: 148 mL. No fibroids or other mass visualized. Endometrium Thickness: 7.1 mm. There is a small amount of fluid in the endometrial canal Right ovary Measurements: 5.5 x 3.3 x 3.2 cm = volume: 31 mL. No ovarian lesion. Complex hypoechoic tubular structure seen in the right adnexa measuring 5.2 x 3.2 x 5.7 cm. Left ovary Measurements: 3.5 x 2.1 x 2.2 cm = volume: 8.5 mL. No ovarian lesion. Complex hypoechoic tubular structure seen in the left adnexa measuring 4.4 x 4.7 x 3.3 cm. Pulsed Doppler evaluation of both ovaries demonstrates normal low-resistance arterial and venous waveforms. Other findings No abnormal free fluid. IMPRESSION: 1. Complex hypoechoic tubular structures in the bilateral adnexa worrisome for dilated fallopian tubes, either pyosalpinx or hematosalpinx. 2. Small amount of fluid in the endometrial canal. 3. Normal appearance of ovaries. Electronically Signed   By: Ronney Asters M.D.   On: 04/04/2021 20:13   DG Chest Portable 1 View  Result Date: 04/04/2021 CLINICAL DATA:  Fever and cough.  EXAM: PORTABLE CHEST 1 VIEW COMPARISON:  Chest x-ray 06/14/2014. FINDINGS: The heart size and mediastinal contours are within normal limits. Both lungs are clear. The visualized skeletal structures are unremarkable. Piercing overlies the right chest. IMPRESSION: No active disease. Electronically Signed   By: Ronney Asters M.D.   On: 04/04/2021 17:51    EKG: Independently reviewed. Showing sinus new incomplete RBBB compared to prior.  Time spent: >60 mins on reviewing previous documentation, lab work, imaging, development of treatment plan with patient, discussions with consultants, evaluation of patient's response to treatment, examination of patient, obtaining history from  patient or surrogate, ordering and performing treatments and interventions, ordering and review of laboratory studies, and re-evaluation of patient's condition.   Orene Desanctis [facility]   If 7PM-7AM, please contact night-coverage [email or web address]  04/05/2021, 9:27 PM

## 2021-04-05 NOTE — Progress Notes (Signed)
GYN Note ECG ordered and RN asked to obtain orthostatics for patient's stable but somewhat low BPs of 90s/50s. Clonidine continues to be held. Pt had a IVF bolus earlier in the day that helped her BPs at 1800 but will hold off on more for now.  Cardiology consult may be needed for tomorrow as patient continues to use IV drugs and her BPs are lower than what they were as an outpatient, last obtained middle of last year.   Cornelia Copa MD Attending Center for [employer] (Faculty Practice) 04/05/2021 Time: 2047

## 2021-04-05 NOTE — ED Notes (Signed)
Christy Living, MD informed of pt's message that her body responds "better" on the Methadone during withdrawals when she is given 10 mg morning & night. Which is what she was given the last time she was a hospital pt & she does not go to a clinic for the methadone outside of the hospital because "I still do dope."

## 2021-04-05 NOTE — Significant Event (Signed)
Rapid Response Event Note   Reason for Call :  Hypotension  Initial Focused Assessment:  I was called to come and assess this patient who has been having soft blood pressure since arriving to [facility]. The patient's family reports that she has been more sleepy today, which is unlike her. Her lactic acid was 1.1, WBC 7.2, and she has an axillary temperature of 99.2.   Of note, her hgb was low on her am CBC at 6.4. 1 U of blood was given and her repeat level was 7.0. The patient has recently had her menses and reports moderate blood loss. She is currently constipated and her last BM was 3 days ago. Denies hematemesis. She did have hematuria, which is not abnormal considering her PID.  Her family reports that she suffers from hemorrhoids.  She currently reports drug use. Her family states that she responds well to methadone 10 mg. Due to her low BP we are not able to give her the ordered clonidine for her withdrawals. I do feel that this patient could benefit from another medication to help with her WD.   Interventions:  Fluid bolus ordered and is infusing.  Vitals obtained  Plan of Care:  Check vital signs 15 minutes after fluid bolus completion. Continue to monitor for any signs of bleeding   Check vital signs q hour until vitals normalize  Event Summary:   MD Notified: Dr. Charlotta Newton Call Time: 1700 Arrival Time: 1705 End Time: 1800  Andrey Spearman, RN

## 2021-04-05 NOTE — Progress Notes (Signed)
GYN PROGRESS NOTE  S: Patient resting comfortably.  Notes improvement of her pain, now down to a 6/10.  No nausea, tolerating clears and feeling hungry this am.  No vomiting.  She does note mild dizziness and fatigue.  O: BP (!) 79/38    Pulse (!) 108    Temp 99.4 F (37.4 C) (Oral)    Resp 18    LMP  (Within Weeks)    SpO2 97%   Gen: no acute distress Psych: mood appropriate CV: RRR Lungs: normal respiratory effort and rate Abd: soft, tenderness improved, no rebound, no guarding Ext: no edema Musc: no calf tenderness bilaterally  Results for orders placed or performed during the hospital encounter of 04/04/21 (from the past 24 hour(s))  Basic metabolic panel     Status: Abnormal   Collection Time: 04/04/21  1:53 PM  Result Value Ref Range   Sodium 131 (L) 135 - 145 mmol/L   Potassium 4.0 3.5 - 5.1 mmol/L   Chloride 97 (L) 98 - 111 mmol/L   CO2 23 22 - 32 mmol/L   Glucose, Bld 111 (H) 70 - 99 mg/dL   BUN 8 6 - 20 mg/dL   Creatinine, Ser 9.62 0.44 - 1.00 mg/dL   Calcium 8.8 (L) 8.9 - 10.3 mg/dL   GFR, Estimated >95 >28 mL/min   Anion gap 11 5 - 15  CBC with Differential     Status: Abnormal   Collection Time: 04/04/21  1:53 PM  Result Value Ref Range   WBC 11.0 (H) 4.0 - 10.5 K/uL   RBC 3.09 (L) 3.87 - 5.11 MIL/uL   Hemoglobin 7.5 (L) 12.0 - 15.0 g/dL   HCT 41.3 (L) 24.4 - 01.0 %   MCV 78.0 (L) 80.0 - 100.0 fL   MCH 24.3 (L) 26.0 - 34.0 pg   MCHC 31.1 30.0 - 36.0 g/dL   RDW 27.2 53.6 - 64.4 %   Platelets 315 150 - 400 K/uL   nRBC 0.0 0.0 - 0.2 %   Neutrophils Relative % 79 %   Neutro Abs 8.7 (H) 1.7 - 7.7 K/uL   Lymphocytes Relative 8 %   Lymphs Abs 0.9 0.7 - 4.0 K/uL   Monocytes Relative 12 %   Monocytes Absolute 1.3 (H) 0.1 - 1.0 K/uL   Eosinophils Relative 0 %   Eosinophils Absolute 0.0 0.0 - 0.5 K/uL   Basophils Relative 0 %   Basophils Absolute 0.0 0.0 - 0.1 K/uL   Immature Granulocytes 1 %   Abs Immature Granulocytes 0.07 0.00 - 0.07 K/uL  I-Stat beta hCG  blood, ED     Status: None   Collection Time: 04/04/21  2:32 PM  Result Value Ref Range   I-stat hCG, quantitative <5.0 <5 mIU/mL   Comment 3          HIV Antibody (routine testing w rflx)     Status: None   Collection Time: 04/04/21  5:33 PM  Result Value Ref Range   HIV Screen 4th Generation wRfx Non Reactive Non Reactive  Resp Panel by RT-PCR (Flu A&B, Covid) [facility] Cytology Urine     Status: None   Collection Time: 04/04/21  8:51 PM   Specimen: [facility] Cytology Urine; Nasopharyngeal(NP) swabs in vial transport medium  Result Value Ref Range   SARS Coronavirus 2 by RT PCR NEGATIVE NEGATIVE   Influenza A by PCR NEGATIVE NEGATIVE   Influenza B by PCR NEGATIVE NEGATIVE  Lactic acid, plasma     Status: None  Collection Time: 04/05/21  5:05 AM  Result Value Ref Range   Lactic Acid, Venous 1.1 0.5 - 1.9 mmol/L  CBC WITH DIFFERENTIAL     Status: Abnormal   Collection Time: 04/05/21  5:05 AM  Result Value Ref Range   WBC 7.2 4.0 - 10.5 K/uL   RBC 2.60 (L) 3.87 - 5.11 MIL/uL   Hemoglobin 6.4 (LL) 12.0 - 15.0 g/dL   HCT 96.2 (L) 95.2 - 84.1 %   MCV 78.8 (L) 80.0 - 100.0 fL   MCH 24.6 (L) 26.0 - 34.0 pg   MCHC 31.2 30.0 - 36.0 g/dL   RDW 32.4 40.1 - 02.7 %   Platelets 224 150 - 400 K/uL   nRBC 0.0 0.0 - 0.2 %   Neutrophils Relative % 77 %   Neutro Abs 5.5 1.7 - 7.7 K/uL   Lymphocytes Relative 12 %   Lymphs Abs 0.9 0.7 - 4.0 K/uL   Monocytes Relative 10 %   Monocytes Absolute 0.7 0.1 - 1.0 K/uL   Eosinophils Relative 0 %   Eosinophils Absolute 0.0 0.0 - 0.5 K/uL   Basophils Relative 0 %   Basophils Absolute 0.0 0.0 - 0.1 K/uL   Immature Granulocytes 1 %   Abs Immature Granulocytes 0.06 0.00 - 0.07 K/uL   A/P: 30yo female admitted due to PID with pyosalpinx -Symptomatic anemia- plan for transfusion of 2upRBC then repeat H/H.  Inform consent obtained for transfusion. -PID -continue IV antibiotics -CBC and temp improving - cultures pending -continue IV fluids -h/o heroin use-  doing ok with supportive care -regular diet this am -activity as tolerated -Pain management: currently on Toradol, plan to transition to ibuprofen and other oral agents  DISPO: PLan to continue IV antibiotics until 24-48hr afebrile   Myna Hidalgo, DO Attending Obstetrician & Gynecologist, Faculty Practice [facility] [employer], [facility]

## 2021-04-05 NOTE — Progress Notes (Signed)
Orthostatic vitals  Lying 86/51 (62) P:76 Sitting: 93/58 (69) P: 80 Standing: 88/54 P: 78

## 2021-04-06 ENCOUNTER — Encounter (HOSPITAL_COMMUNITY): Payer: Self-pay | Admitting: Obstetrics & Gynecology

## 2021-04-06 ENCOUNTER — Inpatient Hospital Stay (HOSPITAL_COMMUNITY): Payer: Self-pay

## 2021-04-06 DIAGNOSIS — R109 Unspecified abdominal pain: Secondary | ICD-10-CM

## 2021-04-06 DIAGNOSIS — N7093 Salpingitis and oophoritis, unspecified: Principal | ICD-10-CM

## 2021-04-06 DIAGNOSIS — F1110 Opioid abuse, uncomplicated: Secondary | ICD-10-CM | POA: Diagnosis present

## 2021-04-06 DIAGNOSIS — R9431 Abnormal electrocardiogram [ECG] [EKG]: Secondary | ICD-10-CM

## 2021-04-06 LAB — COMPREHENSIVE METABOLIC PANEL
ALT: 9 U/L (ref 0–44)
AST: 15 U/L (ref 15–41)
Albumin: 2 g/dL — ABNORMAL LOW (ref 3.5–5.0)
Alkaline Phosphatase: 63 U/L (ref 38–126)
Anion gap: 7 (ref 5–15)
BUN: 6 mg/dL (ref 6–20)
CO2: 24 mmol/L (ref 22–32)
Calcium: 7.8 mg/dL — ABNORMAL LOW (ref 8.9–10.3)
Chloride: 105 mmol/L (ref 98–111)
Creatinine, Ser: 0.61 mg/dL (ref 0.44–1.00)
GFR, Estimated: >60 mL/min (ref >60)
Glucose, Bld: 124 mg/dL — ABNORMAL HIGH (ref 70–99)
Potassium: 3.3 mmol/L — ABNORMAL LOW (ref 3.5–5.1)
Sodium: 136 mmol/L (ref 135–145)
Total Bilirubin: 0.7 mg/dL (ref 0.3–1.2)
Total Protein: 5 g/dL — ABNORMAL LOW (ref 6.5–8.1)

## 2021-04-06 LAB — CBC WITH DIFFERENTIAL/PLATELET
Abs Immature Granulocytes: 0.17 10*3/uL — ABNORMAL HIGH (ref 0.00–0.07)
Basophils Absolute: 0 10*3/uL (ref 0.0–0.1)
Basophils Relative: 0 %
Eosinophils Absolute: 0 10*3/uL (ref 0.0–0.5)
Eosinophils Relative: 0 %
HCT: 24.5 % — ABNORMAL LOW (ref 36.0–46.0)
Hemoglobin: 8.2 g/dL — ABNORMAL LOW (ref 12.0–15.0)
Immature Granulocytes: 3 %
Lymphocytes Relative: 18 %
Lymphs Abs: 1.2 10*3/uL (ref 0.7–4.0)
MCH: 26.5 pg (ref 26.0–34.0)
MCHC: 33.5 g/dL (ref 30.0–36.0)
MCV: 79 fL — ABNORMAL LOW (ref 80.0–100.0)
Monocytes Absolute: 0.5 10*3/uL (ref 0.1–1.0)
Monocytes Relative: 8 %
Neutro Abs: 4.9 10*3/uL (ref 1.7–7.7)
Neutrophils Relative %: 71 %
Platelets: 250 10*3/uL (ref 150–400)
RBC: 3.1 MIL/uL — ABNORMAL LOW (ref 3.87–5.11)
RDW: 15.1 % (ref 11.5–15.5)
WBC: 6.9 10*3/uL (ref 4.0–10.5)
nRBC: 0 % (ref 0.0–0.2)

## 2021-04-06 LAB — TYPE AND SCREEN
ABO/RH(D): A POS
Antibody Screen: NEGATIVE
Unit division: 0
Unit division: 0

## 2021-04-06 LAB — BPAM RBC
Blood Product Expiration Date: 202212282359
Blood Product Expiration Date: 202212282359
ISSUE DATE / TIME: 202212170846
ISSUE DATE / TIME: 202212171026
Unit Type and Rh: 6200
Unit Type and Rh: 6200

## 2021-04-06 LAB — HEPATITIS B SURFACE ANTIGEN: Hepatitis B Surface Ag: NONREACTIVE

## 2021-04-06 LAB — ECHOCARDIOGRAM COMPLETE
AR max vel: 2.38 cm2
AV Peak grad: 8 mmHg
Ao pk vel: 1.41 m/s
Area-P 1/2: 5.42 cm2
Calc EF: 58.3 %
MV M vel: 4.14 m/s
MV Peak grad: 68.6 mmHg
S' Lateral: 3.1 cm
Single Plane A2C EF: 57.3 %
Single Plane A4C EF: 58.5 %

## 2021-04-06 MED ORDER — POTASSIUM CHLORIDE CRYS ER 20 MEQ PO TBCR
40.0000 meq | EXTENDED_RELEASE_TABLET | Freq: Two times a day (BID) | ORAL | Status: DC
  Administered 2021-04-06 – 2021-04-07 (×2): 40 meq via ORAL
  Filled 2021-04-06 (×4): qty 2

## 2021-04-06 MED ORDER — POLYSACCHARIDE IRON COMPLEX 150 MG PO CAPS
150.0000 mg | ORAL_CAPSULE | ORAL | Status: DC
  Administered 2021-04-06: 12:00:00 150 mg via ORAL
  Filled 2021-04-06: qty 1

## 2021-04-06 MED ORDER — DOXYCYCLINE HYCLATE 100 MG PO TABS
100.0000 mg | ORAL_TABLET | Freq: Two times a day (BID) | ORAL | Status: DC
  Administered 2021-04-06 – 2021-04-07 (×2): 100 mg via ORAL
  Filled 2021-04-06 (×3): qty 1

## 2021-04-06 MED ORDER — POTASSIUM CHLORIDE CRYS ER 20 MEQ PO TBCR
40.0000 meq | EXTENDED_RELEASE_TABLET | Freq: Once | ORAL | Status: AC
  Administered 2021-04-06: 12:00:00 40 meq via ORAL
  Filled 2021-04-06: qty 2

## 2021-04-06 MED ORDER — LACTATED RINGERS IV SOLN: INTRAVENOUS | Status: DC

## 2021-04-06 MED ORDER — PANTOPRAZOLE SODIUM 40 MG PO TBEC
40.0000 mg | DELAYED_RELEASE_TABLET | Freq: Every day | ORAL | Status: DC
  Administered 2021-04-07: 10:00:00 40 mg via ORAL
  Filled 2021-04-06: qty 1

## 2021-04-06 NOTE — Progress Notes (Signed)
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 Hospital course complicated by symptomatic anemia requiring 2 unit of packed red blood cell and hypotension secondary to clonidine administration for opioid withdrawal.  Hospitalist consulted to help with hypotension.    Assessment & Plan:   Principal Problem:   TOA (tubo-ovarian abscess)/bilateral hydrosalpinges Active Problems:   Hepatitis C, chronic (HCC)   Substance abuse ([place])   PID (acute pelvic inflammatory disease)   Hypotension   Heroin abuse (HCC)   1-Hypotension:  Multifactorial secondary to anemia, clonidine, hypovolemia She received 2 units of packed red blood cell until 5/17 Continue with IV fluids Clonidine was discontinued Continue antibiotics coverage for PID. Repeat 2D echo  2-IV opioids abuse: Patient reporter withdrawal symptoms.  She was initially started on Suboxone but she declined it. Patient was a started on methadone 10 mg daily continue  3-PAD with pyosalpinx: Management per OB gynecology 4-Hypokalemia: Replete orally 5-Anemia: We will check anemia panel.  She received 2 unit of packed red blood cell.  Hemoglobin is stable today.    Estimated body mass index is 22.6 kg/m as calculated from the following:   Height as of 09/29/19: 5\' 6"  (1.676 m).   Weight as of 09/29/19: 63.5 kg.   DVT prophylaxis: SCD Code Status: Full code Family Communication: care discussed with patient Disposition Plan:  Status is: Inpatient  Remains inpatient appropriate because: IV anitibiotics        Antimicrobials:   Ceftriaxone, doxy , Flagyl   Subjective: She is sleepy, not feeling well, due to withdrawal, denies diarrhea, no tremors.   Objective: Vitals:   04/05/21 2023 04/06/21 0328 04/06/21 0653 04/06/21 0828  BP: (!) 93/56 96/66 100/65 (!) 97/57  Pulse: 80 67 74 80  Resp: 19  16 15   Temp: 98.3 F (36.8 C)  99.7 F (37.6 C) 98.9 F (37.2 C)  TempSrc: Oral  Oral Oral  SpO2: 97%  96% 97%    Intake/Output Summary (Last 24 hours) at 04/06/2021 1344 Last data filed at 04/06/2021 1000 Gross per 24 hour  Intake 100 ml  Output --  Net 100 ml   There were no vitals filed for this visit.  Examination:  General exam: Appears calm and comfortable  Respiratory system: Clear to auscultation. Respiratory effort normal. Cardiovascular system: S1 & S2 heard, RRR. No JVD, murmurs, rubs, gallops or clicks. No pedal edema. Gastrointestinal system: Abdomen is nondistended, soft and nontender. No organomegaly or masses felt. Normal bowel sounds heard. Central nervous system: Alert and oriented. Sleepy  Extremities: Symmetric 5 x 5 power. Skin: No rashes, lesions or ulcers    Data Reviewed: I have personally reviewed following labs and imaging studies  CBC: Recent Labs  Lab 04/04/21 1353 04/05/21 0505 04/05/21 1339 04/05/21 2132 04/06/21 0754  WBC 11.0* 7.2  --   --  6.9  NEUTROABS 8.7* 5.5  --   --  4.9  HGB 7.5* 6.4* 7.0* 8.0* 8.2*  HCT 24.1* 20.5* 21.3* 24.8* 24.5*  MCV 78.0* 78.8*  --   --  79.0*  PLT 315 224  --   --  [id number]   Basic Metabolic Panel: Recent  Labs  Lab 04/04/21 1353 04/06/21 0754  NA 131* 136  K 4.0 3.3*  CL 97* 105  CO2 23 24  GLUCOSE 111* 124*  BUN 8 6  CREATININE 0.61 0.61  CALCIUM 8.8* 7.8*   GFR: CrCl cannot be calculated (Unknown ideal weight.). Liver Function Tests: Recent Labs  Lab 04/06/21 0754  AST 15  ALT 9  ALKPHOS 63  BILITOT 0.7  PROT 5.0*  ALBUMIN 2.0*   No results for input(s): LIPASE, AMYLASE in the last 168 hours. No results  for input(s): AMMONIA in the last 168 hours. Coagulation Profile: No results for input(s): INR, PROTIME in the last 168 hours. Cardiac Enzymes: No results for input(s): CKTOTAL, CKMB, CKMBINDEX, TROPONINI in the last 168 hours. BNP (last 3 results) No results for input(s): PROBNP in the last 8760 hours. HbA1C: No results for input(s): HGBA1C in the last 72 hours. CBG: No results for input(s): GLUCAP in the last 168 hours. Lipid Profile: No results for input(s): CHOL, HDL, LDLCALC, TRIG, CHOLHDL, LDLDIRECT in the last 72 hours. Thyroid Function Tests: No results for input(s): TSH, T4TOTAL, FREET4, T3FREE, THYROIDAB in the last 72 hours. Anemia Panel: No results for input(s): VITAMINB12, FOLATE, FERRITIN, TIBC, IRON, RETICCTPCT in the last 72 hours. Sepsis Labs: Recent Labs  Lab 04/05/21 0505  LATICACIDVEN 1.1    Recent Results (from the past 240 hour(s))  Blood culture (routine x 2)     Status: None (Preliminary result)   Collection Time: 04/04/21  5:35 PM   Specimen: BLOOD RIGHT WRIST  Result Value Ref Range Status   Specimen Description BLOOD RIGHT WRIST  Final   Special Requests   Final    BOTTLES DRAWN AEROBIC AND ANAEROBIC Blood Culture results may not be optimal due to an inadequate volume of blood received in culture bottles   Culture   Final    NO GROWTH < 24 HOURS Performed at [facility], [address] [place], [place] [address]    Report Status PENDING  Incomplete  Resp Panel by RT-PCR (Flu A&B, Covid) PATH Cytology Urine     Status: None   Collection Time: 04/04/21  8:51 PM   Specimen: PATH Cytology Urine; Nasopharyngeal(NP) swabs in vial transport medium  Result Value Ref Range Status   SARS Coronavirus 2 by RT PCR NEGATIVE NEGATIVE Final    Comment: (NOTE) SARS-CoV-2 target nucleic acids are NOT DETECTED.  The SARS-CoV-2 RNA is generally detectable in upper respiratory specimens during the acute phase of infection. The lowest concentration of  SARS-CoV-2 viral copies this assay can detect is 138 copies/mL. A negative result does not preclude SARS-Cov-2 infection and should not be used as the sole basis for treatment or other patient management decisions. A negative result may occur with  improper specimen collection/handling, submission of specimen other than nasopharyngeal swab, presence of viral mutation(s) within the areas targeted by this assay, and inadequate number of viral copies(<138 copies/mL). A negative result must be combined with clinical observations, patient history, and epidemiological information. The expected result is Negative.  Fact Sheet for Patients:  BloggerCourse.com  Fact Sheet for Healthcare Providers:  SeriousBroker.it  This test is no t yet approved or cleared by the [place] FDA and  has been authorized for detection and/or diagnosis of SARS-CoV-2 by FDA under an Emergency Use Authorization (EUA). This EUA will remain  in effect (meaning this test can be used) for the duration of the COVID-19 declaration under Section 564(b)(1) of the Act, 21 U.S.C.section  360bbb-3(b)(1), unless the authorization is terminated  or revoked sooner.       Influenza A by PCR NEGATIVE NEGATIVE Final   Influenza B by PCR NEGATIVE NEGATIVE Final    Comment: (NOTE) The Xpert Xpress SARS-CoV-2/FLU/RSV plus assay is intended as an aid in the diagnosis of influenza from Nasopharyngeal swab specimens and should not be used as a sole basis for treatment. Nasal washings and aspirates are unacceptable for Xpert Xpress SARS-CoV-2/FLU/RSV testing.  Fact Sheet for Patients: EntrepreneurPulse.com.au  Fact Sheet for Healthcare Providers: IncredibleEmployment.be  This test is not yet approved or cleared by the [place] FDA and has been authorized for detection and/or diagnosis of SARS-CoV-2 by FDA under an Emergency Use  Authorization (EUA). This EUA will remain in effect (meaning this test can be used) for the duration of the COVID-19 declaration under Section 564(b)(1) of the Act, 21 U.S.C. section 360bbb-3(b)(1), unless the authorization is terminated or revoked.  Performed at [facility], [place] [address], [place], [place] [address]          Radiology Studies: US PELVIS (TRANSABDOMINAL ONLY)  Result Date: 04/04/2021 CLINICAL DATA:  Right lower quadrant pain. EXAM: TRANSABDOMINAL AND TRANSVAGINAL ULTRASOUND OF PELVIS DOPPLER ULTRASOUND OF OVARIES TECHNIQUE: Both transabdominal and transvaginal ultrasound examinations of the pelvis were performed. Transabdominal technique was performed for global imaging of the pelvis including uterus, ovaries, adnexal regions, and pelvic cul-de-sac. It was necessary to proceed with endovaginal exam following the transabdominal exam to visualize the ovaries and endometrium. Color and duplex Doppler ultrasound was utilized to evaluate blood flow to the ovaries. COMPARISON:  CT abdomen and pelvis 04/04/2021 FINDINGS: Uterus Measurements: 12.4 x 3.9 x 5.9 cm = volume: 148 mL. No fibroids or other mass visualized. Endometrium Thickness: 7.1 mm. There is a small amount of fluid in the endometrial canal Right ovary Measurements: 5.5 x 3.3 x 3.2 cm = volume: 31 mL. No ovarian lesion. Complex hypoechoic tubular structure seen in the right adnexa measuring 5.2 x 3.2 x 5.7 cm. Left ovary Measurements: 3.5 x 2.1 x 2.2 cm = volume: 8.5 mL. No ovarian lesion. Complex hypoechoic tubular structure seen in the left adnexa measuring 4.4 x 4.7 x 3.3 cm. Pulsed Doppler evaluation of both ovaries demonstrates normal low-resistance arterial and venous waveforms. Other findings No abnormal free fluid. IMPRESSION: 1. Complex hypoechoic tubular structures in the bilateral adnexa worrisome for dilated fallopian tubes, either pyosalpinx or hematosalpinx. 2. Small amount of fluid in the endometrial  canal. 3. Normal appearance of ovaries. Electronically Signed   By: Ronney Asters M.D.   On: 04/04/2021 20:13   CT ABDOMEN PELVIS W CONTRAST  Result Date: 04/04/2021 CLINICAL DATA:  Pain right lower quadrant EXAM: CT ABDOMEN AND PELVIS WITH CONTRAST TECHNIQUE: Multidetector CT imaging of the abdomen and pelvis was performed using the standard protocol following bolus administration of intravenous contrast. CONTRAST:  34mL OMNIPAQUE IOHEXOL 300 MG/ML  SOLN COMPARISON:  05/19/2019 FINDINGS: Lower chest: No significant abnormality is seen Hepatobiliary: Liver is enlarged measuring 19.4 cm in length. No focal abnormality is seen. Gallbladder is not distended Pancreas: No focal abnormality is seen. Spleen: Spleen measures 15.7 cm in maximum diameter. Adrenals/Urinary Tract: Adrenals are not enlarged. There is no hydronephrosis. There are no renal or ureteral stones. Urinary bladder is unremarkable. Stomach/Bowel: Stomach is not distended. Small bowel loops are not dilated. Appendix is not distinctly seen. There is no pericecal inflammation. There is no significant wall thickening in the ascending, transverse and descending colon. There  is incomplete distention of rectosigmoid. There is abnormal diffuse wall thickening in the rectosigmoid. There is moderate amount perirectal fluid collection. There are serpiginous fluid-filled structures posterior to the uterus measuring up to 3.5 cm in diameter. There is fluid filled structures could not be separately identified from the adnexal regions. There is wall thickening. There are small pockets of air in this region which may lie within a collapsed small bowel loop or suggest extraluminal air. There is no evidence of pneumoperitoneum in the abdomen. There is anterior displacement of uterus. Vascular/Lymphatic: Vascular structures are unremarkable. Reproductive: Uterus is unremarkable. There are serpiginous fluid-filled structures posterior to the uterus with thick wall as  described above. There is small to moderate amount of free fluid in the pelvis. Other: There is no pneumoperitoneum in the abdomen. Musculoskeletal: Degenerative changes are noted at the L5-S1 level. IMPRESSION: There is no evidence intestinal obstruction. There is no hydronephrosis. Appendix is not distinctly visualized. There is no focal pericecal inflammation. There is marked inflammation in the pelvic cavity which may suggest inflammatory or infectious colitis and proctitis. There are serpiginous fluid-filled structures measuring up to 3.5 cm in diameter between the rectum and uterus. Differential diagnostic possibilities would include PID with pyosalpinx or inflamed distal small bowel loops. There are small pockets of air between rectum and uterus in the some of the images which may be lying within a collapsed small bowel loop or free air due to bowel perforation. Please correlate with clinical physical examination and laboratory findings. If further characterization is needed, one could consider repeat examination with oral and rectal contrast or pelvic sonogram. Enlarged liver and spleen. Imaging findings were relayed to Dr. Dolores Frame by telephone call. Electronically Signed   By: Elmer Picker M.D.   On: 04/04/2021 19:02   US PELVIC DOPPLER (TORSION R/O OR MASS ARTERIAL FLOW)  Result Date: 04/04/2021 CLINICAL DATA:  Right lower quadrant pain. EXAM: TRANSABDOMINAL AND TRANSVAGINAL ULTRASOUND OF PELVIS DOPPLER ULTRASOUND OF OVARIES TECHNIQUE: Both transabdominal and transvaginal ultrasound examinations of the pelvis were performed. Transabdominal technique was performed for global imaging of the pelvis including uterus, ovaries, adnexal regions, and pelvic cul-de-sac. It was necessary to proceed with endovaginal exam following the transabdominal exam to visualize the ovaries and endometrium. Color and duplex Doppler ultrasound was utilized to evaluate blood flow to the ovaries. COMPARISON:  CT  abdomen and pelvis 04/04/2021 FINDINGS: Uterus Measurements: 12.4 x 3.9 x 5.9 cm = volume: 148 mL. No fibroids or other mass visualized. Endometrium Thickness: 7.1 mm. There is a small amount of fluid in the endometrial canal Right ovary Measurements: 5.5 x 3.3 x 3.2 cm = volume: 31 mL. No ovarian lesion. Complex hypoechoic tubular structure seen in the right adnexa measuring 5.2 x 3.2 x 5.7 cm. Left ovary Measurements: 3.5 x 2.1 x 2.2 cm = volume: 8.5 mL. No ovarian lesion. Complex hypoechoic tubular structure seen in the left adnexa measuring 4.4 x 4.7 x 3.3 cm. Pulsed Doppler evaluation of both ovaries demonstrates normal low-resistance arterial and venous waveforms. Other findings No abnormal free fluid. IMPRESSION: 1. Complex hypoechoic tubular structures in the bilateral adnexa worrisome for dilated fallopian tubes, either pyosalpinx or hematosalpinx. 2. Small amount of fluid in the endometrial canal. 3. Normal appearance of ovaries. Electronically Signed   By: Ronney Asters M.D.   On: 04/04/2021 20:13   DG Chest Portable 1 View  Result Date: 04/04/2021 CLINICAL DATA:  Fever and cough. EXAM: PORTABLE CHEST 1 VIEW COMPARISON:  Chest x-ray 06/14/2014. FINDINGS: The  heart size and mediastinal contours are within normal limits. Both lungs are clear. The visualized skeletal structures are unremarkable. Piercing overlies the right chest. IMPRESSION: No active disease. Electronically Signed   By: Ronney Asters M.D.   On: 04/04/2021 17:51        Scheduled Meds:  doxycycline  100 mg Oral Q12H   iron polysaccharides  150 mg Oral QODAY   ketorolac  30 mg Intravenous Q6H   methadone  10 mg Oral Daily   metroNIDAZOLE  500 mg Oral Q12H   polyethylene glycol  17 g Oral BID   potassium chloride  40 mEq Oral BID   Continuous Infusions:  cefTRIAXone (ROCEPHIN)  IV 2 g (04/06/21 1209)   lactated ringers 100 mL/hr at 04/06/21 0918     LOS: 2 days    Time spent: 35 minutes.     Elmarie Shiley,  MD [facility]   If 7PM-7AM, please contact night-coverage [email or web address]  04/06/2021, 1:44 PM

## 2021-04-06 NOTE — Progress Notes (Signed)
Patient called c/o bleeding. Noted spotting on the bed sheet. Refused inspection but says its vaginal. Offered pads and instructed to show RN prior to discarding used pads.  1800 hrs- Patient reported that she has changed pads and "not much bleeding" per patient .Re-educated to let the RN see the used pads prior to discarding.

## 2021-04-06 NOTE — Progress Notes (Signed)
GYN PROGRESS NOTE  S: Patient resting comfortably in bed, undergoing ECHO study ordered by [facility] team.  Notes improvement of her pain, now down to a 6/10.  No nausea, tolerating regular diet.   No vomiting.    O: BP (!) 97/57 (BP Location: Left Arm) Comment: pt refused retake   Pulse 80    Temp 98.9 F (37.2 C) (Oral)    Resp 15    LMP  (Within Weeks)    SpO2 97%   Gen: no acute distress Psych: mood appropriate CV: RRR Lungs: normal respiratory effort and rate QBH:ALPFXTK adamantly refused me touching her abdomen Ext: no edema   Labs: CBC Latest Ref Rng & Units 04/06/2021 04/05/2021 04/05/2021  WBC 4.0 - 10.5 K/uL 6.9 - -  Hemoglobin 12.0 - 15.0 g/dL 8.2(L) 8.0(L) 7.0(L)  Hematocrit 36.0 - 46.0 % 24.5(L) 24.8(L) 21.3(L)  Platelets 150 - 400 K/uL 250 - -   CMP Latest Ref Rng & Units 04/06/2021 04/04/2021 09/12/2019  Glucose 70 - 99 mg/dL 240(X) 735(H) 90  BUN 6 - 20 mg/dL 6 8 9   Creatinine 0.44 - 1.00 mg/dL 2.99 2.42  Sodium 135 - 145 mmol/L 136 131(L) 138  Potassium 3.5 - 5.1 mmol/L 3.3(L) 4.0 3.9  Chloride 98 - 111 mmol/L 105 97(L) 105  CO2 22 - 32 mmol/L 24 23 25   Calcium 8.9 - 10.3 mg/dL 7.8(L) 8.8(L) 9.3  Total Protein 6.5 - 8.1 g/dL 5.0(L) - 7.0  Total Bilirubin 0.3 - 1.2 mg/dL 0.7 - 0.4  Alkaline Phos 38 - 126 U/L 63 - -  AST 15 - 41 U/L 15 - 16  ALT 0 - 44 U/L 9 - 10    A/P: 30yo female admitted due to TOA/bilateral pyosalpinges, but had active IVDU with heroin and history of multiple complications as a result. -Anemia - Improved hemoglobin to 8.2.  Will continue oral iron therapy.  -TOA -Continue antibiotics (Ceftriaxone, po Doxycycline and Metronidazole) - CBC and temp improving, no fevers since 12/16 at 2050 - cultures pending - continue IV fluids -h/o heroin use- started on Methadone 10 mg by [facility] -Cardiac issues - followed by [facility], will follow up ECHO, [facility] recommendations -Pain management: currently on Toradol, plan to transition to ibuprofen and other oral  agents -Regular diet -Activity as tolerated  DISPO: Plan to continue IV antibiotics until 24-48hr afebrile, possible discharge tomorrow.   1/17, MD Attending Obstetrician & Gynecologist, [facility] for [facility], [facility] [phone]

## 2021-04-07 ENCOUNTER — Other Ambulatory Visit (HOSPITAL_COMMUNITY): Payer: Self-pay

## 2021-04-07 DIAGNOSIS — R102 Pelvic and perineal pain: Secondary | ICD-10-CM

## 2021-04-07 LAB — CBC WITH DIFFERENTIAL/PLATELET
Abs Immature Granulocytes: 0.03 10*3/uL (ref 0.00–0.07)
Basophils Absolute: 0 10*3/uL (ref 0.0–0.1)
Basophils Relative: 0 %
Eosinophils Absolute: 0 10*3/uL (ref 0.0–0.5)
Eosinophils Relative: 0 %
HCT: 24.5 % — ABNORMAL LOW (ref 36.0–46.0)
Hemoglobin: 8 g/dL — ABNORMAL LOW (ref 12.0–15.0)
Immature Granulocytes: 1 %
Lymphocytes Relative: 18 %
Lymphs Abs: 1.1 10*3/uL (ref 0.7–4.0)
MCH: 26.1 pg (ref 26.0–34.0)
MCHC: 32.7 g/dL (ref 30.0–36.0)
MCV: 79.8 fL — ABNORMAL LOW (ref 80.0–100.0)
Monocytes Absolute: 0.6 10*3/uL (ref 0.1–1.0)
Monocytes Relative: 10 %
Neutro Abs: 4.5 10*3/uL (ref 1.7–7.7)
Neutrophils Relative %: 71 %
Platelets: 323 10*3/uL (ref 150–400)
RBC: 3.07 MIL/uL — ABNORMAL LOW (ref 3.87–5.11)
RDW: 15.7 % — ABNORMAL HIGH (ref 11.5–15.5)
WBC: 6.4 10*3/uL (ref 4.0–10.5)
nRBC: 0 % (ref 0.0–0.2)

## 2021-04-07 LAB — GC/CHLAMYDIA PROBE AMP (~~LOC~~) NOT AT ARMC
Chlamydia: NEGATIVE
Comment: NEGATIVE
Comment: NORMAL
Neisseria Gonorrhea: NEGATIVE

## 2021-04-07 LAB — BASIC METABOLIC PANEL
Anion gap: 6 (ref 5–15)
BUN: 7 mg/dL (ref 6–20)
CO2: 26 mmol/L (ref 22–32)
Calcium: 8.1 mg/dL — ABNORMAL LOW (ref 8.9–10.3)
Chloride: 108 mmol/L (ref 98–111)
Creatinine, Ser: 0.71 mg/dL (ref 0.44–1.00)
GFR, Estimated: >60 mL/min (ref >60)
Glucose, Bld: 95 mg/dL (ref 70–99)
Potassium: 4.4 mmol/L (ref 3.5–5.1)
Sodium: 140 mmol/L (ref 135–145)

## 2021-04-07 LAB — IRON AND TIBC
Iron: 10 ug/dL — ABNORMAL LOW (ref 28–170)
Saturation Ratios: 3 % — ABNORMAL LOW (ref 10.4–31.8)
TIBC: 287 ug/dL (ref 250–450)
UIBC: 277 ug/dL

## 2021-04-07 LAB — RETICULOCYTES
Immature Retic Fract: 14.3 % (ref 2.3–15.9)
RBC.: 3.07 MIL/uL — ABNORMAL LOW (ref 3.87–5.11)
Retic Count, Absolute: 37.8 10*3/uL (ref 19.0–186.0)
Retic Ct Pct: 1.2 % (ref 0.4–3.1)

## 2021-04-07 LAB — FOLATE: Folate: 7 ng/mL (ref >5.9)

## 2021-04-07 LAB — FERRITIN: Ferritin: 31 ng/mL (ref 11–307)

## 2021-04-07 LAB — VITAMIN B12: Vitamin B-12: 338 pg/mL (ref 180–914)

## 2021-04-07 LAB — RPR: RPR Ser Ql: NONREACTIVE

## 2021-04-07 MED ORDER — ONDANSETRON 4 MG PO TBDP
4.0000 mg | ORAL_TABLET | Freq: Four times a day (QID) | ORAL | 0 refills | Status: AC | PRN
  Filled 2021-04-07: qty 20, 5d supply, fill #0

## 2021-04-07 MED ORDER — PANTOPRAZOLE SODIUM 40 MG PO TBEC
40.0000 mg | DELAYED_RELEASE_TABLET | Freq: Every day | ORAL | 3 refills | Status: AC
  Filled 2021-04-07: qty 30, 30d supply, fill #0

## 2021-04-07 MED ORDER — DOXYCYCLINE HYCLATE 100 MG PO TABS
100.0000 mg | ORAL_TABLET | Freq: Two times a day (BID) | ORAL | 0 refills | Status: AC
Start: 2021-04-07 — End: ?
  Filled 2021-04-07: qty 28, 14d supply, fill #0

## 2021-04-07 MED ORDER — POLYSACCHARIDE IRON COMPLEX 150 MG PO CAPS
150.0000 mg | ORAL_CAPSULE | ORAL | 0 refills | Status: AC
  Filled 2021-04-07: qty 30, 60d supply, fill #0

## 2021-04-07 MED ORDER — METHADONE HCL 10 MG PO TABS
15.0000 mg | ORAL_TABLET | Freq: Every day | ORAL | Status: DC
  Administered 2021-04-07: 10:00:00 15 mg via ORAL
  Filled 2021-04-07: qty 2

## 2021-04-07 MED ORDER — HYDROXYZINE HCL 25 MG PO TABS
25.0000 mg | ORAL_TABLET | Freq: Four times a day (QID) | ORAL | 0 refills | Status: AC | PRN
  Filled 2021-04-07: qty 30, 8d supply, fill #0

## 2021-04-07 MED ORDER — TRAMADOL HCL 50 MG PO TABS
50.0000 mg | ORAL_TABLET | Freq: Four times a day (QID) | ORAL | 0 refills | Status: AC | PRN
  Filled 2021-04-07: qty 20, 5d supply, fill #0

## 2021-04-07 MED ORDER — METRONIDAZOLE 500 MG PO TABS
500.0000 mg | ORAL_TABLET | Freq: Two times a day (BID) | ORAL | 0 refills | Status: AC
  Filled 2021-04-07: qty 28, 14d supply, fill #0

## 2021-04-07 MED ORDER — POLYETHYLENE GLYCOL 3350 17 GM/SCOOP PO POWD
17.0000 g | Freq: Two times a day (BID) | ORAL | 0 refills | Status: AC
  Filled 2021-04-07: qty 238, 7d supply, fill #0

## 2021-04-07 NOTE — Progress Notes (Addendum)
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 Hospital course complicated by symptomatic anemia requiring 2 unit of packed red blood cell and hypotension secondary to clonidine administration for opioid withdrawal.  Hospitalist consulted to help with hypotension.    Assessment & Plan:   Principal Problem:   TOA (tubo-ovarian abscess)/bilateral hydrosalpinges Active Problems:   Hepatitis C, chronic (HCC)   Substance abuse (HCC)   PID (acute pelvic inflammatory disease)   Hypotension   Heroin abuse (HCC)   1-Hypotension:  Multifactorial secondary to anemia, clonidine, hypovolemia She received 2 units of packed red blood cell until 5/17 Received IV fluids Clonidine was discontinued Continue antibiotics coverage for PID. Repeat 2D echo normal EF.  Resolved.   2-IV opioids abuse: Patient reporter withdrawal symptoms.  She was initially started on Suboxone but she declined it. Patient was a started on methadone 10 mg daily continue, increase dose to 15 mg.  Report withdrawal symptoms. CM/SW to help arrange methadone clinic.   3-PAD with pyosalpinx: Management per [facility] gynecology. Discharge on ferrous sulfate.  4-Hypokalemia: Replete orally 5-Anemia: anemia panel: iron deficiency anemia.  She received 2 unit of packed red blood cell.  Hemoglobin is stable today.    Estimated body mass index is 22.6 kg/m as calculated from the following:   Height as of 09/29/19:  (1.676 m).   Weight as of 09/29/19: 63.5 kg.   DVT prophylaxis: SCD Code Status: Full code Family Communication: care  discussed with patient Disposition Plan:  Status is: Inpatient  Remains inpatient appropriate because: IV anitibiotics        Antimicrobials:  Ceftriaxone, doxy , Flagyl   Subjective: She report joint pain, and irritability. She will go to methadone clinic.  Decline inpatient rehab for heroin detox.   Objective: Vitals:   04/06/21 0828 04/06/21 1540 04/06/21 2200 04/07/21 0504  BP: (!) 97/57 105/68 97/66 109/73  Pulse: 80 (!) 104 60 80  Resp: Temp: 98.9 F (37.2 C) 98.3 F (36.8 C) 98.3 F (36.8 C) 98.8 F (37.1 C)  TempSrc: Oral Axillary    SpO2: 97% 98% 97% 99%    Intake/Output Summary (Last 24 hours) at 04/07/2021 1235 Last data filed at 04/06/2021 1721 Gross per 24 hour  Intake 835.51 ml  Output 1 ml  Net 834.51 ml    There were no vitals filed for this visit.  Examination:  General exam: NAD Respiratory system: CTA Cardiovascular system: S 1, S 2 RRR Gastrointestinal system: BS present, soft, nt  Central nervous system: Alert Extremities: No edema    Data Reviewed: I have personally reviewed following labs and imaging studies  CBC: Recent Labs  Lab 04/04/21 1353 04/05/21 0505 04/05/21 1339 04/05/21 2132 04/06/21 0754 04/07/21 1028  WBC 11.0* 7.2  --   --  6.9 6.4  NEUTROABS 8.7* 5.5  --   --  4.9 4.5  HGB 7.5* 6.4* 7.0* 8.0* 8.2* 8.0*  HCT 24.1* 20.5* 21.3* 24.8* 24.5* 24.5*  MCV 78.0* 78.8*  --   --  79.0* 79.8*  PLT 315 224  --   --  250 323    Basic  Metabolic Panel: Recent Labs  Lab 04/04/21 1353 04/06/21 0754 04/07/21 1028  NA 131* 136 140  K 4.0 3.3* 4.4  CL 97* 105 108  CO2 23 24 26   GLUCOSE 111* 124* 95  BUN 8 6 7   CREATININE 0.61 0.61 0.71  CALCIUM 8.8* 7.8* 8.1*    GFR: CrCl cannot be calculated (Unknown ideal weight.). Liver Function Tests: Recent Labs  Lab 04/06/21 0754  AST 15  ALT 9  ALKPHOS 63  BILITOT 0.7  PROT 5.0*  ALBUMIN 2.0*    No results for input(s): LIPASE, AMYLASE in the  last 168 hours. No results for input(s): AMMONIA in the last 168 hours. Coagulation Profile: No results for input(s): INR, PROTIME in the last 168 hours. Cardiac Enzymes: No results for input(s): CKTOTAL, CKMB, CKMBINDEX, TROPONINI in the last 168 hours. BNP (last 3 results) No results for input(s): PROBNP in the last 8760 hours. HbA1C: No results for input(s): HGBA1C in the last 72 hours. CBG: No results for input(s): GLUCAP in the last 168 hours. Lipid Profile: No results for input(s): CHOL, HDL, LDLCALC, TRIG, CHOLHDL, LDLDIRECT in the last 72 hours. Thyroid Function Tests: No results for input(s): TSH, T4TOTAL, FREET4, T3FREE, THYROIDAB in the last 72 hours. Anemia Panel: Recent Labs    04/07/21 1028  VITAMINB12 338  FOLATE 7.0  FERRITIN 31  TIBC 287  IRON 10*  RETICCTPCT 1.2   Sepsis Labs: Recent Labs  Lab 04/05/21 0505  LATICACIDVEN 1.1     Recent Results (from the past 240 hour(s))  Blood culture (routine x 2)     Status: None (Preliminary result)   Collection Time: 04/04/21  5:35 PM   Specimen: BLOOD RIGHT WRIST  Result Value Ref Range Status   Specimen Description BLOOD RIGHT WRIST  Final   Special Requests   Final    BOTTLES DRAWN AEROBIC AND ANAEROBIC Blood Culture results may not be optimal due to an inadequate volume of blood received in culture bottles   Culture   Final    NO GROWTH 3 DAYS Performed at [facility], [address], [place], [address] [place]    Report Status PENDING  Incomplete  Resp Panel by RT-PCR (Flu A&B, Covid) PATH Cytology Urine     Status: None   Collection Time: 04/04/21  8:51 PM   Specimen: PATH Cytology Urine; Nasopharyngeal(NP) swabs in vial transport medium  Result Value Ref Range Status   SARS Coronavirus 2 by RT PCR NEGATIVE NEGATIVE Final    Comment: (NOTE) SARS-CoV-2 target nucleic acids are NOT DETECTED.  The SARS-CoV-2 RNA is generally detectable in upper respiratory specimens during the acute phase of  infection. The lowest concentration of SARS-CoV-2 viral copies this assay can detect is 138 copies/mL. A negative result does not preclude SARS-Cov-2 infection and should not be used as the sole basis for treatment or other patient management decisions. A negative result may occur with  improper specimen collection/handling, submission of specimen other than nasopharyngeal swab, presence of viral mutation(s) within the areas targeted by this assay, and inadequate number of viral copies(<138 copies/mL). A negative result must be combined with clinical observations, patient history, and epidemiological information. The expected result is Negative.  Fact Sheet for Patients:  22025  Fact Sheet for Healthcare Providers:  04/06/21  This test is no t yet approved or cleared by the [employer] FDA and  has been authorized for detection and/or diagnosis of SARS-CoV-2 by FDA under an Emergency Use Authorization (EUA). This  EUA will remain  in effect (meaning this test can be used) for the duration of the COVID-19 declaration under Section 564(b)(1) of the Act, 21 U.S.C.section 360bbb-3(b)(1), unless the authorization is terminated  or revoked sooner.       Influenza A by PCR NEGATIVE NEGATIVE Final   Influenza B by PCR NEGATIVE NEGATIVE Final    Comment: (NOTE) The Xpert Xpress SARS-CoV-2/FLU/RSV plus assay is intended as an aid in the diagnosis of influenza from Nasopharyngeal swab specimens and should not be used as a sole basis for treatment. Nasal washings and aspirates are unacceptable for Xpert Xpress SARS-CoV-2/FLU/RSV testing.  Fact Sheet for Patients: BloggerCourse.com  Fact Sheet for Healthcare Providers: SeriousBroker.it  This test is not yet approved or cleared by the [place] FDA and has been authorized for detection and/or diagnosis of SARS-CoV-2  by FDA under an Emergency Use Authorization (EUA). This EUA will remain in effect (meaning this test can be used) for the duration of the COVID-19 declaration under Section 564(b)(1) of the Act, 21 U.S.C. section 360bbb-3(b)(1), unless the authorization is terminated or revoked.  Performed at [facility], [address], [place], [place] [address]           Radiology Studies: ECHOCARDIOGRAM COMPLETE  Result Date: 04/06/2021    ECHOCARDIOGRAM REPORT   Patient Name:   Christy Pearson Date of Exam: 04/06/2021 Medical Rec #:  [id number]       Height:       66.0 in Accession #:    [id number]      Weight:       140.0 lb Date of Birth:  Apr 18, 1991       BSA:          1.719 m Patient Age:    30 years        BP:           100/65 mmHg Patient Gender: F               HR:           69 bpm. Exam Location:  Inpatient Procedure: 2D Echo, Color Doppler and Cardiac Doppler Indications:    Abnormal EKG  History:        Patient has prior history of Echocardiogram examinations.                 Endocarditis and TV vegetation.  Sonographer:    Cleatis Polka Referring Phys: [phone] Faizan Geraci A Abdelaziz Westenberger IMPRESSIONS  1. Left ventricular ejection fraction, by estimation, is 60 to 65%. The left ventricle has normal function. The left ventricle has no regional wall motion abnormalities. Left ventricular diastolic parameters were normal. There is the interventricular septum is flattened in diastole ('D' shaped left ventricle), consistent with right ventricular volume overload.  2. Right ventricular systolic function is normal. The right ventricular size is normal.  3. The mitral valve is normal in structure. Trivial mitral valve regurgitation. No evidence of mitral stenosis.  4. The tricuspid valve is abnormal. Tricuspid valve regurgitation is severe.  5. The aortic valve is normal in structure. Aortic valve regurgitation is not visualized. No aortic stenosis is present.  6. The inferior vena cava is normal in size with  greater than 50% respiratory variability, suggesting right atrial pressure of 3 mmHg. Comparison(s): No significant change from prior study. Prior images reviewed side by side. FINDINGS  Left Ventricle: Left ventricular ejection fraction, by estimation, is 60 to 65%. The left ventricle has normal function. The left  ventricle has no regional wall motion abnormalities. The left ventricular internal cavity size was normal in size. There is  no left ventricular hypertrophy. The interventricular septum is flattened in diastole ('D' shaped left ventricle), consistent with right ventricular volume overload. Left ventricular diastolic parameters were normal. Right Ventricle: The right ventricular size is normal. No increase in right ventricular wall thickness. Right ventricular systolic function is normal. Left Atrium: Left atrial size was normal in size. Right Atrium: Right atrial size was normal in size. Pericardium: There is no evidence of pericardial effusion. Mitral Valve: The mitral valve is normal in structure. Trivial mitral valve regurgitation. No evidence of mitral valve stenosis. Tricuspid Valve: Thickened tricuspid valve cusps with malcoaptation, postinflammatory changes. The tricuspid valve is abnormal. Tricuspid valve regurgitation is severe. No evidence of tricuspid stenosis. The flow in the hepatic veins is blunted (decreased) during ventricular systole. Aortic Valve: The aortic valve is normal in structure. Aortic valve regurgitation is not visualized. No aortic stenosis is present. Aortic valve peak gradient measures 8.0 mmHg. Pulmonic Valve: The pulmonic valve was normal in structure. Pulmonic valve regurgitation is trivial. No evidence of pulmonic stenosis. Aorta: The aortic root is normal in size and structure. Venous: The inferior vena cava is normal in size with greater than 50% respiratory variability, suggesting right atrial pressure of 3 mmHg. IAS/Shunts: There is left bowing of the interatrial  septum, suggestive of elevated right atrial pressure. No atrial level shunt detected by color flow Doppler.  LEFT VENTRICLE PLAX 2D LVIDd:         4.70 cm      Diastology LVIDs:         3.10 cm      LV e' medial:    8.92 cm/s LV PW:         0.90 cm      LV E/e' medial:  9.1 LV IVS:        0.90 cm      LV e' lateral:   15.80 cm/s LVOT diam:     2.00 cm      LV E/e' lateral: 5.2 LV SV:         61 LV SV Index:   35 LVOT Area:     3.14 cm  LV Volumes (MOD) LV vol d, MOD A2C: 103.0 ml LV vol d, MOD A4C: 100.0 ml LV vol s, MOD A2C: 44.0 ml LV vol s, MOD A4C: 41.5 ml LV SV MOD A2C:     59.0 ml LV SV MOD A4C:     100.0 ml LV SV MOD BP:      59.9 ml RIGHT VENTRICLE             IVC RV Basal diam:  3.80 cm     IVC diam: 2.10 cm RV Mid diam:    3.40 cm RV S prime:     12.20 cm/s TAPSE (M-mode): 2.0 cm LEFT ATRIUM             Index        RIGHT ATRIUM           Index LA diam:        3.30 cm 1.92 cm/m   RA Area:     20.00 cm LA Vol (A2C):   43.7 ml 25.43 ml/m  RA Volume:   60.10 ml  34.97 ml/m LA Vol (A4C):   33.5 ml 19.49 ml/m LA Biplane Vol: 38.2 ml 22.23 ml/m  AORTIC VALVE AV Area (Vmax): 2.38 cm  AV Vmax:        141.00 cm/s AV Peak Grad:   8.0 mmHg LVOT Vmax:      107.00 cm/s LVOT Vmean:     77.900 cm/s LVOT VTI:       0.194 m  AORTA Ao Root diam: 2.70 cm Ao Asc diam:  3.10 cm MITRAL VALVE               TRICUSPID VALVE MV Area (PHT): 5.42 cm    TR Peak grad:   30.5 mmHg MV Decel Time: 140 msec    TR Vmax:        276.00 cm/s MR Peak grad: 68.6 mmHg MR Mean grad: 51.0 mmHg    SHUNTS MR Vmax:      414.00 cm/s  Systemic VTI:  0.19 m MR Vmean:     348.5 cm/s   Systemic Diam: 2.00 cm MV E velocity: 81.50 cm/s MV A velocity: 51.40 cm/s MV E/A ratio:  1.59 Mihai Croitoru MD Electronically signed by Thurmon Fair MD Signature Date/Time: 04/06/2021/2:23:09 PM    Final         Scheduled Meds:  doxycycline  100 mg Oral Q12H   iron polysaccharides  150 mg Oral QODAY   ketorolac  30 mg Intravenous Q6H   methadone  15 mg  Oral Daily   metroNIDAZOLE  500 mg Oral Q12H   pantoprazole  40 mg Oral Daily   polyethylene glycol  17 g Oral BID   potassium chloride  40 mEq Oral BID   Continuous Infusions:  cefTRIAXone (ROCEPHIN)  IV 2 g (04/07/21 1216)     LOS: 3 days    Time spent: 35 minutes.     Alba Cory, MD [facility]   If 7PM-7AM, please contact night-coverage [email or web address]  04/07/2021, 12:35 PM

## 2021-04-07 NOTE — Social Work (Signed)
CSW met with pt to offer substance use resources. Pt declined. CSW also offered needle exchange program information. Pt declined that as well. CSW  added information to pts AVS.   Emeterio Reeve, LCSW Clinical Social Worker

## 2021-04-07 NOTE — Progress Notes (Incomplete)
Pt asked "What are those big pills yall are giving me?"Pt was informed that potassium has been ordered for her due to low potassium. Pt states "I'm not taking those they are too big and they taste bad."

## 2021-04-07 NOTE — Discharge Summary (Signed)
Physician Discharge Summary  Patient ID: Christy Pearson MRN: [id number] DOB/AGE: October 18, 1990 30 y.o.  Admit date: 04/04/2021 Discharge date: 04/07/2021  Admission Diagnoses: TOA  Discharge Diagnoses:  Principal Problem:   TOA (tubo-ovarian abscess)/bilateral hydrosalpinges Active Problems:   Hepatitis C, chronic (HCC)   Substance abuse (HCC)   PID (acute pelvic inflammatory disease)   Hypotension   Heroin abuse (HCC)   Discharged Condition: fair  Hospital Course: 30 yo admitted with pelvic pain amd radiologic findings concerning for bilateral TOA. Patient received IV antibiotics and was transitioned to oral intake. She remained afebrile for over 48 hours. She was able to tolerate oral intake. She reports some improvement in her abdominal pain although it is still present. Her WBC normalized and blood culture remained negative. Patient with known history of IV drug use started on methadone in the hospital. Patient declined suboxone. Repeat echo unchanged from 06/2019 without evidence of endocarditis or vegetation. Patient stable for discharge with 14-day course of antibiotics with plans to follow up in the office in 2 weeks. Patient to meet with social worker prior to discharge home to transition to outpatient clinic for continued methadone.   Consults: cardiology  Treatments: IV antibiotics  Discharge Exam: Blood pressure 109/73, pulse 80, temperature 98.8 F (37.1 C), resp. rate 18, SpO2 99 %, unknown if currently breastfeeding. GENERAL: Well-developed, well-nourished female in no acute distress.  LUNGS: Clear to auscultation bilaterally.  HEART: Regular rate and rhythm. ABDOMEN: Soft, mild lower abdominal tenderness, nondistended. No organomegaly. PELVIC: Not indicated EXTREMITIES: No cyanosis, clubbing, or edema, 2+ distal pulses.   Disposition: Discharge disposition: 01-Home or Self Care     home   Allergies as of 04/07/2021       Reactions   Tylenol  [acetaminophen] Rash   Pt just reported tylenol allergy at this visit at 2338pm        Medication List     STOP taking these medications    carvedilol 3.125 MG tablet Commonly known as: COREG   ferrous sulfate 325 (65 FE) MG tablet Commonly known as: FerrouSul   gabapentin 600 MG tablet Commonly known as: NEURONTIN   ibuprofen 600 MG tablet Commonly known as: ADVIL   magnesium oxide 400 (241.3 Mg) MG tablet Commonly known as: MAG-OX   Prenatal Vitamins 0.8 MG tablet       TAKE these medications    doxycycline 100 MG tablet Commonly known as: VIBRA-TABS Take 1 tablet (100 mg total) by mouth every 12 (twelve) hours.   hydrOXYzine 25 MG tablet Commonly known as: ATARAX Take 1 tablet (25 mg total) by mouth every 6 (six) hours as needed for anxiety or itching.   iron polysaccharides 150 MG capsule Commonly known as: NIFEREX Take 1 capsule (150 mg total) by mouth every other day. Start taking on: April 08, 2021   metroNIDAZOLE 500 MG tablet Commonly known as: FLAGYL Take 1 tablet (500 mg total) by mouth every 12 (twelve) hours.   ondansetron 4 MG disintegrating tablet Commonly known as: ZOFRAN-ODT Take 1 tablet (4 mg total) by mouth every 6 (six) hours as needed for nausea or vomiting.   pantoprazole 40 MG tablet Commonly known as: PROTONIX Take 1 tablet (40 mg total) by mouth daily.   polyethylene glycol 17 g packet Commonly known as: MIRALAX / GLYCOLAX Take 17 g by mouth 2 (two) times daily.   traMADol 50 MG tablet Commonly known as: ULTRAM Take 1 tablet (50 mg total) by mouth every 6 (six) hours as needed for  moderate pain.        Follow-up Information     [facility] Follow up.   Specialty: Obstetrics and Gynecology Why: An appointment will be scheduled for you to follow up in 2 weeks Contact information: [address] [place] [address] [phone]                 Signed: Catalina Antigua 04/07/2021, 11:35 AM

## 2021-04-07 NOTE — Discharge Instructions (Signed)
[facility] Solution to the Opioid Problem (GCSTOP) County Served:  [place] [facility]; mobile; peer-based Roxy Cedar [phone] [email or web address]  Tuesdays: [facility] [address] [place], [address] 9:00 am - 4:00 pm  Wednesday: [facility] [address] [place], [address] 2:00 - 5:00 pm  Thursday: Mobile Deliveries 12:00 - 3:00 pm; [facility] [address], [place], [address] 2:00 - 6:00 pm  Friday: [facility] [place] [address] [place], [address] 10:00 am - 12:30 pm [facility] [address] [place], [address] 2:00 - 4:00 p

## 2021-04-07 NOTE — TOC Progression Note (Addendum)
Transition of Care [facility]) - Progression Note    Patient Details  Name: Christy Pearson MRN: [id number] Date of Birth: 03-Dec-1990  Transition of [facility]) CM/SW Contact  Beckie Busing, RN Phone Number:[phone]  04/07/2021, 10:39 AM  Clinical Narrative:    TOC consulted to assist patient with getting set up with Methadone Clinic. CM at bedside to discuss this option with the patient. Patient states that she does not want to go to the methadone clinic and that this is what the doctors wants. Patient rude to CM stating that she does not feel like talking and would like for CM to leave her room. CM informed patient that there would need to be a conversation in order to determine patients needs. Patient then yells I don't want to be bothered, I just want my meds and I have been to the methadone clinic before. CM left the patients room. MD was outside the patients room and made aware of the conversation. TOC will continue to follow for any other needs.   1400 MATCH completed to provide patient with 30 day supply of meds per [facility] pharmacy. Meds to be delivered to bedside prior to d/c.       Expected Discharge Plan and Services                                                 Social Determinants of Health (SDOH) Interventions    Readmission Risk Interventions Readmission Risk Prevention Plan 06/09/2019  Post Dischage Appt Not Complete  Appt Comments no PCP, will need appointment at dc  Medication Screening Complete  Transportation Screening Complete  Some recent data might be hidden

## 2021-04-07 NOTE — Progress Notes (Signed)
Pt discharged home with her mom this pm. Did not leave with meds with from Transitions of care as she said that she did not have the fifty eight dollar payment. She also said that she spoke to someone in pharmacy and notified them that when she gets the money she will come and get the medicines.

## 2021-04-07 NOTE — Progress Notes (Signed)
Following up with pt on reported vaginal bleeding. Pt states that it's not really bleeding it's more like a vaginal discharge with a pinkish color and that comes and goes.

## 2021-04-08 LAB — HCV RNA QUANT RFLX ULTRA OR GENOTYP
HCV RNA Qnt(log copy/mL): UNDETERMINED log10 IU/mL
HepC Qn: NOT DETECTED IU/mL

## 2021-04-09 LAB — CULTURE, BLOOD (ROUTINE X 2): Culture: NO GROWTH

## 2023-11-10 DIAGNOSIS — N939 Abnormal uterine and vaginal bleeding, unspecified: Secondary | ICD-10-CM | POA: Insufficient documentation

## 2023-11-10 DIAGNOSIS — R110 Nausea: Secondary | ICD-10-CM | POA: Diagnosis not present

## 2023-11-10 DIAGNOSIS — R519 Headache, unspecified: Secondary | ICD-10-CM | POA: Insufficient documentation

## 2023-11-10 DIAGNOSIS — R102 Pelvic and perineal pain: Secondary | ICD-10-CM | POA: Diagnosis not present

## 2023-11-11 ENCOUNTER — Other Ambulatory Visit: Payer: Self-pay

## 2023-11-11 ENCOUNTER — Emergency Department (HOSPITAL_BASED_OUTPATIENT_CLINIC_OR_DEPARTMENT_OTHER)
Admission: EM | Admit: 2023-11-11 | Discharge: 2023-11-11 | Disposition: A | Discharge status: Home / Self Care | Attending: Emergency Medicine | Admitting: Emergency Medicine

## 2023-11-11 ENCOUNTER — Encounter (HOSPITAL_BASED_OUTPATIENT_CLINIC_OR_DEPARTMENT_OTHER): Payer: Self-pay

## 2023-11-11 DIAGNOSIS — N939 Abnormal uterine and vaginal bleeding, unspecified: Secondary | ICD-10-CM

## 2023-11-11 LAB — CBC WITH DIFFERENTIAL/PLATELET
Abs Immature Granulocytes: 0.01 K/uL (ref 0.00–0.07)
Basophils Absolute: 0.1 K/uL (ref 0.0–0.1)
Basophils Relative: 1 %
Eosinophils Absolute: 0.1 K/uL (ref 0.0–0.5)
Eosinophils Relative: 2 %
HCT: 34.6 % — ABNORMAL LOW (ref 36.0–46.0)
Hemoglobin: 11.6 g/dL — ABNORMAL LOW (ref 12.0–15.0)
Immature Granulocytes: 0 %
Lymphocytes Relative: 35 %
Lymphs Abs: 1.5 K/uL (ref 0.7–4.0)
MCH: 29.1 pg (ref 26.0–34.0)
MCHC: 33.5 g/dL (ref 30.0–36.0)
MCV: 86.7 fL (ref 80.0–100.0)
Monocytes Absolute: 0.3 K/uL (ref 0.1–1.0)
Monocytes Relative: 7 %
Neutro Abs: 2.4 K/uL (ref 1.7–7.7)
Neutrophils Relative %: 55 %
Platelets: 222 K/uL (ref 150–400)
RBC: 3.99 MIL/uL (ref 3.87–5.11)
RDW: 15 % (ref 11.5–15.5)
WBC: 4.4 K/uL (ref 4.0–10.5)
nRBC: 0 % (ref 0.0–0.2)

## 2023-11-11 LAB — BASIC METABOLIC PANEL WITH GFR
Anion gap: 9 (ref 5–15)
BUN: 10 mg/dL (ref 6–20)
CO2: 24 mmol/L (ref 22–32)
Calcium: 9.2 mg/dL (ref 8.9–10.3)
Chloride: 107 mmol/L (ref 98–111)
Creatinine, Ser: 0.63 mg/dL (ref 0.44–1.00)
GFR, Estimated: >60 mL/min (ref >60)
Glucose, Bld: 84 mg/dL (ref 70–99)
Potassium: 4.3 mmol/L (ref 3.5–5.1)
Sodium: 140 mmol/L (ref 135–145)

## 2023-11-11 LAB — HCG, QUANTITATIVE, PREGNANCY: hCG, Beta Chain, Quant, S: <1 m[IU]/mL (ref <5)

## 2023-11-11 NOTE — ED Triage Notes (Addendum)
 Pt states she has been bleeding x 17 days, stopped today Has nexplanon  in arm for BC placed in FEB States she is having abdominal pain and has passed several clots.

## 2023-11-11 NOTE — ED Provider Notes (Signed)
 [facility] AT [facility] Provider Note   CSN: [id number] Arrival date & time: 11/10/23  2354     Patient presents with: Vaginal Bleeding   Christy Pearson is a 33 y.o. female.   The history is provided by the patient.  Vaginal Bleeding Christy Pearson is a 33 y.o. female who presents to the Emergency Department complaining of vaginal bleeding.  She presents to the emergency department for evaluation of 17 days of vaginal bleeding.  Today her bleeding stopped.  She does report associated pelvic cramping.  She did have a Nexplanon placed just prior to the symptoms initiating.  Also reports some nausea, headache.  No vaginal discharge.  She is active with 1 partner.  No associated fever.  She is scheduled to see gynecology on August 7.      Prior to Admission medications   Medication Sig Start Date End Date Taking? Authorizing Provider  doxycycline  (VIBRA -TABS) 100 MG tablet Take 1 tablet (100 mg total) by mouth every 12 (twelve) hours. 04/07/21   Constant, Peggy, MD  hydrOXYzine  (ATARAX ) 25 MG tablet Take 1 tablet (25 mg total) by mouth every 6 (six) hours as needed for anxiety or itching. 04/07/21   Constant, Peggy, MD  iron  polysaccharides (NIFEREX) 150 MG capsule Take 1 capsule (150 mg total) by mouth every other day. 04/08/21   Constant, Peggy, MD  metroNIDAZOLE  (FLAGYL ) 500 MG tablet Take 1 tablet (500 mg total) by mouth every 12 (twelve) hours. 04/07/21   Constant, Peggy, MD  ondansetron  (ZOFRAN -ODT) 4 MG disintegrating tablet Take 1 tablet (4 mg total) by mouth every 6 (six) hours as needed for nausea or vomiting. 04/07/21   Constant, Peggy, MD  pantoprazole  (PROTONIX ) 40 MG tablet Take 1 tablet (40 mg total) by mouth daily. 04/07/21   Constant, Peggy, MD  polyethylene glycol powder (GLYCOLAX /MIRALAX ) 17 GM/SCOOP powder Take 17 g by mouth 2 (two) times daily. 04/07/21   Constant, Peggy, MD  traMADol  (ULTRAM ) 50 MG tablet Take 1 tablet (50 mg total)  by mouth every 6 (six) hours as needed for moderate pain. 04/07/21   Constant, Peggy, MD    Allergies: Patient has no active allergies.    Review of Systems  Genitourinary:  Positive for vaginal bleeding.  All other systems reviewed and are negative.   Updated Vital Signs BP 115/68 (BP Location: Left Arm)   Pulse 71   Temp 98.3 F (36.8 C) (Oral)   Resp 16   Ht 5' 6 (1.676 m)   Wt 68 kg   SpO2 100%   BMI 24.21 kg/m   Physical Exam Vitals and nursing note reviewed.  Constitutional:      Appearance: Normal appearance.  HENT:     Head: Normocephalic and atraumatic.  Cardiovascular:     Rate and Rhythm: Normal rate and regular rhythm.  Pulmonary:     Effort: Pulmonary effort is normal. No respiratory distress.  Abdominal:     Palpations: Abdomen is soft.     Tenderness: There is no abdominal tenderness. There is no guarding or rebound.  Skin:    General: Skin is warm and dry.  Neurological:     Mental Status: She is alert.  Psychiatric:        Mood and Affect: Mood normal.     (all labs ordered are listed, but only abnormal results are displayed) Labs Reviewed  CBC WITH DIFFERENTIAL/PLATELET - Abnormal; Notable for the following components:      Result Value  Hemoglobin 11.6 (*)    HCT 34.6 (*)    All other components within normal limits  BASIC METABOLIC PANEL WITH GFR  HCG, QUANTITATIVE, PREGNANCY    EKG: None  Radiology: No results found.   Procedures   Medications Ordered in the ED - No data to display                                  Medical Decision Making Amount and/or Complexity of Data Reviewed Labs: ordered.   Patient here for evaluation of 17 days of vaginal bleeding, bleeding did stop today.  She also has associated pelvic cramping.  CBC with hemoglobin that is improved when compared to priors.  She is not currently pregnant.  Discussed with patient findings of studies.  Offered to perform pelvic examination given her symptoms and  she declines at this time and prefers to follow-up with gynecology for further evaluation.  Feel she is stable for discharge with outpatient [facility] follow-up.     Final diagnoses:  Vaginal bleeding    ED Discharge Orders     None          Griselda Norris, MD 11/11/23 [phone]
